# Patient Record
Sex: Female | Born: 1955 | Race: White | Hispanic: No
Health system: Southern US, Community
[De-identification: ages and names within clinical notes are randomized; demographics above are authoritative.]

## PROBLEM LIST (undated history)

## (undated) DIAGNOSIS — E119 Type 2 diabetes mellitus without complications: Secondary | ICD-10-CM

## (undated) DIAGNOSIS — I10 Essential (primary) hypertension: Secondary | ICD-10-CM

## (undated) DIAGNOSIS — M109 Gout, unspecified: Secondary | ICD-10-CM

## (undated) DIAGNOSIS — F329 Major depressive disorder, single episode, unspecified: Secondary | ICD-10-CM

## (undated) DIAGNOSIS — E669 Obesity, unspecified: Secondary | ICD-10-CM

## (undated) DIAGNOSIS — F32A Depression, unspecified: Secondary | ICD-10-CM

## (undated) DIAGNOSIS — G2581 Restless legs syndrome: Secondary | ICD-10-CM

## (undated) DIAGNOSIS — D219 Benign neoplasm of connective and other soft tissue, unspecified: Secondary | ICD-10-CM

## (undated) DIAGNOSIS — M545 Low back pain: Secondary | ICD-10-CM

## (undated) DIAGNOSIS — F419 Anxiety disorder, unspecified: Secondary | ICD-10-CM

## (undated) DIAGNOSIS — M1990 Unspecified osteoarthritis, unspecified site: Secondary | ICD-10-CM

## (undated) DIAGNOSIS — G8929 Other chronic pain: Secondary | ICD-10-CM

## (undated) DIAGNOSIS — F319 Bipolar disorder, unspecified: Secondary | ICD-10-CM

## (undated) DIAGNOSIS — K219 Gastro-esophageal reflux disease without esophagitis: Secondary | ICD-10-CM

## (undated) DIAGNOSIS — E785 Hyperlipidemia, unspecified: Secondary | ICD-10-CM

## (undated) HISTORY — DX: Benign neoplasm of connective and other soft tissue, unspecified: D21.9

## (undated) HISTORY — DX: Low back pain: M54.5

## (undated) HISTORY — DX: Obesity, unspecified: E66.9

## (undated) HISTORY — DX: Gastro-esophageal reflux disease without esophagitis: K21.9

## (undated) HISTORY — PX: KNEE ARTHROPLASTY: SHX992

## (undated) HISTORY — DX: Type 2 diabetes mellitus without complications: E11.9

## (undated) HISTORY — DX: Unspecified osteoarthritis, unspecified site: M19.90

## (undated) HISTORY — DX: Hyperlipidemia, unspecified: E78.5

## (undated) HISTORY — PX: SPINE SURGERY: SHX786

## (undated) HISTORY — DX: Bipolar disorder, unspecified: F31.9

## (undated) HISTORY — DX: Depression, unspecified: F32.A

## (undated) HISTORY — PX: OVARIAN CYST REMOVAL: SHX89

## (undated) HISTORY — PX: FOOT SURGERY: SHX648

## (undated) HISTORY — DX: Major depressive disorder, single episode, unspecified: F32.9

## (undated) HISTORY — DX: Restless legs syndrome: G25.81

## (undated) HISTORY — PX: CHOLECYSTECTOMY: SHX55

## (undated) HISTORY — DX: Anxiety disorder, unspecified: F41.9

## (undated) HISTORY — DX: Gout, unspecified: M10.9

## (undated) HISTORY — DX: Essential (primary) hypertension: I10

## (undated) HISTORY — DX: Other chronic pain: G89.29

## (undated) HISTORY — PX: ABDOMINAL HYSTERECTOMY: SHX81

---

## 1998-09-23 ENCOUNTER — Encounter: Payer: Self-pay | Admitting: Emergency Medicine

## 1998-09-23 ENCOUNTER — Emergency Department (HOSPITAL_COMMUNITY): Admission: EM | Admit: 1998-09-23 | Discharge: 1998-09-23 | Payer: Self-pay | Admitting: Emergency Medicine

## 1998-11-24 ENCOUNTER — Emergency Department (HOSPITAL_COMMUNITY): Admission: EM | Admit: 1998-11-24 | Discharge: 1998-11-24 | Payer: Self-pay | Admitting: Emergency Medicine

## 1999-01-28 ENCOUNTER — Emergency Department (HOSPITAL_COMMUNITY): Admission: EM | Admit: 1999-01-28 | Discharge: 1999-01-28 | Payer: Self-pay | Admitting: Emergency Medicine

## 1999-01-28 ENCOUNTER — Encounter: Payer: Self-pay | Admitting: Emergency Medicine

## 1999-11-11 ENCOUNTER — Encounter: Payer: Self-pay | Admitting: Surgery

## 1999-11-11 ENCOUNTER — Ambulatory Visit (HOSPITAL_COMMUNITY): Admission: RE | Admit: 1999-11-11 | Discharge: 1999-11-11 | Payer: Self-pay | Admitting: Surgery

## 1999-11-17 ENCOUNTER — Encounter: Payer: Self-pay | Admitting: Surgery

## 1999-11-19 ENCOUNTER — Encounter (INDEPENDENT_AMBULATORY_CARE_PROVIDER_SITE_OTHER): Payer: Self-pay | Admitting: Specialist

## 1999-11-19 ENCOUNTER — Encounter: Payer: Self-pay | Admitting: Surgery

## 1999-11-19 ENCOUNTER — Observation Stay (HOSPITAL_COMMUNITY): Admission: RE | Admit: 1999-11-19 | Discharge: 1999-11-19 | Payer: Self-pay | Admitting: Surgery

## 2001-06-16 ENCOUNTER — Encounter: Payer: Self-pay | Admitting: Family Medicine

## 2001-06-16 ENCOUNTER — Encounter: Admission: RE | Admit: 2001-06-16 | Discharge: 2001-06-16 | Payer: Self-pay | Admitting: Family Medicine

## 2003-05-06 ENCOUNTER — Emergency Department (HOSPITAL_COMMUNITY): Admission: EM | Admit: 2003-05-06 | Discharge: 2003-05-06 | Payer: Self-pay | Admitting: Emergency Medicine

## 2003-05-16 ENCOUNTER — Emergency Department (HOSPITAL_COMMUNITY): Admission: EM | Admit: 2003-05-16 | Discharge: 2003-05-16 | Payer: Self-pay | Admitting: Emergency Medicine

## 2003-05-28 ENCOUNTER — Emergency Department (HOSPITAL_COMMUNITY): Admission: EM | Admit: 2003-05-28 | Discharge: 2003-05-28 | Payer: Self-pay | Admitting: Emergency Medicine

## 2003-05-29 ENCOUNTER — Emergency Department (HOSPITAL_COMMUNITY): Admission: EM | Admit: 2003-05-29 | Discharge: 2003-05-29 | Payer: Self-pay | Admitting: Emergency Medicine

## 2003-06-26 ENCOUNTER — Ambulatory Visit (HOSPITAL_COMMUNITY): Admission: RE | Admit: 2003-06-26 | Discharge: 2003-06-27 | Payer: Self-pay | Admitting: Orthopaedic Surgery

## 2003-09-24 ENCOUNTER — Ambulatory Visit (HOSPITAL_COMMUNITY): Admission: RE | Admit: 2003-09-24 | Discharge: 2003-09-24 | Payer: Self-pay | Admitting: Orthopaedic Surgery

## 2004-06-26 ENCOUNTER — Emergency Department (HOSPITAL_COMMUNITY): Admission: EM | Admit: 2004-06-26 | Discharge: 2004-06-26 | Payer: Self-pay | Admitting: Emergency Medicine

## 2004-10-10 ENCOUNTER — Emergency Department (HOSPITAL_COMMUNITY): Admission: EM | Admit: 2004-10-10 | Discharge: 2004-10-10 | Payer: Self-pay | Admitting: Emergency Medicine

## 2005-12-19 ENCOUNTER — Emergency Department (HOSPITAL_COMMUNITY): Admission: EM | Admit: 2005-12-19 | Discharge: 2005-12-19 | Payer: Self-pay | Admitting: Emergency Medicine

## 2006-12-22 ENCOUNTER — Emergency Department (HOSPITAL_COMMUNITY): Admission: EM | Admit: 2006-12-22 | Discharge: 2006-12-22 | Payer: Self-pay | Admitting: Emergency Medicine

## 2006-12-23 ENCOUNTER — Ambulatory Visit: Payer: Self-pay | Admitting: Family Medicine

## 2006-12-23 LAB — CONVERTED CEMR LAB
CO2: 20 meq/L (ref 19–32)
Calcium: 9 mg/dL (ref 8.4–10.5)
Creatinine, Ser: 0.8 mg/dL (ref 0.40–1.20)
Eosinophils Relative: 5 % (ref 0–5)
Glucose, Bld: 86 mg/dL (ref 70–99)
HCT: 39.8 % (ref 36.0–46.0)
Hemoglobin: 13.1 g/dL (ref 12.0–15.0)
Lymphocytes Relative: 26 % (ref 12–46)
Lymphs Abs: 2.7 10*3/uL (ref 0.7–3.3)
Monocytes Absolute: 0.9 10*3/uL — ABNORMAL HIGH (ref 0.2–0.7)
RBC: 4.45 M/uL (ref 3.87–5.11)
Total Bilirubin: 0.4 mg/dL (ref 0.3–1.2)
Total Protein: 7.2 g/dL (ref 6.0–8.3)
Uric Acid, Serum: 4.7 mg/dL (ref 2.4–7.0)
WBC: 10.1 10*3/uL (ref 4.0–10.5)

## 2006-12-28 ENCOUNTER — Ambulatory Visit: Payer: Self-pay | Admitting: *Deleted

## 2007-04-20 ENCOUNTER — Ambulatory Visit: Payer: Self-pay | Admitting: Family Medicine

## 2007-05-02 ENCOUNTER — Ambulatory Visit (HOSPITAL_COMMUNITY): Admission: RE | Admit: 2007-05-02 | Discharge: 2007-05-02 | Payer: Self-pay | Admitting: Family Medicine

## 2007-05-05 ENCOUNTER — Emergency Department (HOSPITAL_COMMUNITY): Admission: EM | Admit: 2007-05-05 | Discharge: 2007-05-05 | Payer: Self-pay | Admitting: Emergency Medicine

## 2007-05-09 ENCOUNTER — Ambulatory Visit: Payer: Self-pay | Admitting: Family Medicine

## 2007-05-22 ENCOUNTER — Ambulatory Visit: Payer: Self-pay | Admitting: Family Medicine

## 2007-08-23 ENCOUNTER — Emergency Department (HOSPITAL_COMMUNITY): Admission: EM | Admit: 2007-08-23 | Discharge: 2007-08-23 | Payer: Self-pay | Admitting: Emergency Medicine

## 2007-09-01 ENCOUNTER — Emergency Department (HOSPITAL_COMMUNITY): Admission: EM | Admit: 2007-09-01 | Discharge: 2007-09-01 | Payer: Self-pay | Admitting: Emergency Medicine

## 2007-10-03 ENCOUNTER — Ambulatory Visit: Payer: Self-pay | Admitting: Family Medicine

## 2007-11-14 ENCOUNTER — Emergency Department (HOSPITAL_COMMUNITY): Admission: EM | Admit: 2007-11-14 | Discharge: 2007-11-14 | Payer: Self-pay | Admitting: Emergency Medicine

## 2007-12-05 ENCOUNTER — Emergency Department (HOSPITAL_COMMUNITY): Admission: EM | Admit: 2007-12-05 | Discharge: 2007-12-05 | Payer: Self-pay | Admitting: Emergency Medicine

## 2007-12-18 ENCOUNTER — Emergency Department (HOSPITAL_COMMUNITY): Admission: EM | Admit: 2007-12-18 | Discharge: 2007-12-18 | Payer: Self-pay | Admitting: Emergency Medicine

## 2008-01-12 ENCOUNTER — Ambulatory Visit: Payer: Self-pay | Admitting: Internal Medicine

## 2008-01-30 ENCOUNTER — Ambulatory Visit (HOSPITAL_COMMUNITY): Admission: RE | Admit: 2008-01-30 | Discharge: 2008-01-30 | Payer: Self-pay | Admitting: Family Medicine

## 2008-02-16 ENCOUNTER — Encounter
Admission: RE | Admit: 2008-02-16 | Discharge: 2008-05-16 | Payer: Self-pay | Admitting: Physical Medicine & Rehabilitation

## 2008-02-19 ENCOUNTER — Ambulatory Visit: Payer: Self-pay | Admitting: Physical Medicine & Rehabilitation

## 2008-02-26 ENCOUNTER — Ambulatory Visit: Payer: Self-pay | Admitting: Physical Medicine & Rehabilitation

## 2008-03-12 ENCOUNTER — Ambulatory Visit: Payer: Self-pay | Admitting: Family Medicine

## 2008-03-12 LAB — CONVERTED CEMR LAB
Amphetamine Screen, Ur: NEGATIVE
Basophils Absolute: 0.1 10*3/uL (ref 0.0–0.1)
Benzodiazepines.: NEGATIVE
CO2: 24 meq/L (ref 19–32)
Chloride: 104 meq/L (ref 96–112)
Creatinine,U: 107.5 mg/dL
Eosinophils Relative: 3 % (ref 0–5)
Glucose, Bld: 94 mg/dL (ref 70–99)
HCT: 39.9 % (ref 36.0–46.0)
Lymphocytes Relative: 24 % (ref 12–46)
Lymphs Abs: 2.3 10*3/uL (ref 0.7–4.0)
Methadone: NEGATIVE
Neutro Abs: 6.3 10*3/uL (ref 1.7–7.7)
Neutrophils Relative %: 65 % (ref 43–77)
Platelets: 330 10*3/uL (ref 150–400)
Potassium: 3.8 meq/L (ref 3.5–5.3)
Sodium: 137 meq/L (ref 135–145)
Vit D, 1,25-Dihydroxy: 19 — ABNORMAL LOW (ref 30–89)
WBC: 9.6 10*3/uL (ref 4.0–10.5)

## 2008-03-26 ENCOUNTER — Ambulatory Visit: Payer: Self-pay | Admitting: Physical Medicine & Rehabilitation

## 2008-03-29 ENCOUNTER — Ambulatory Visit (HOSPITAL_COMMUNITY)
Admission: RE | Admit: 2008-03-29 | Discharge: 2008-03-29 | Payer: Self-pay | Admitting: Physical Medicine & Rehabilitation

## 2008-04-23 ENCOUNTER — Ambulatory Visit: Payer: Self-pay | Admitting: Physical Medicine & Rehabilitation

## 2008-06-06 ENCOUNTER — Ambulatory Visit: Payer: Self-pay | Admitting: Family Medicine

## 2008-06-11 ENCOUNTER — Encounter: Admission: RE | Admit: 2008-06-11 | Discharge: 2008-09-09 | Payer: Self-pay

## 2008-06-14 ENCOUNTER — Encounter
Admission: RE | Admit: 2008-06-14 | Discharge: 2008-07-24 | Payer: Self-pay | Admitting: Physical Medicine & Rehabilitation

## 2008-06-18 ENCOUNTER — Ambulatory Visit: Payer: Self-pay | Admitting: Physical Medicine & Rehabilitation

## 2008-07-12 ENCOUNTER — Ambulatory Visit: Payer: Self-pay | Admitting: Physical Medicine & Rehabilitation

## 2008-08-02 ENCOUNTER — Ambulatory Visit: Payer: Self-pay | Admitting: Family Medicine

## 2008-08-06 ENCOUNTER — Ambulatory Visit (HOSPITAL_COMMUNITY): Admission: RE | Admit: 2008-08-06 | Discharge: 2008-08-06 | Payer: Self-pay

## 2008-09-13 ENCOUNTER — Ambulatory Visit: Payer: Self-pay | Admitting: Family Medicine

## 2008-09-19 ENCOUNTER — Ambulatory Visit: Payer: Self-pay | Admitting: Family Medicine

## 2008-09-19 LAB — CONVERTED CEMR LAB
Basophils Absolute: 0 10*3/uL (ref 0.0–0.1)
Basophils Relative: 1 % (ref 0–1)
Eosinophils Relative: 5 % (ref 0–5)
HCT: 37.4 % (ref 36.0–46.0)
Hemoglobin: 12.3 g/dL (ref 12.0–15.0)
MCHC: 32.9 g/dL (ref 30.0–36.0)
MCV: 87 fL (ref 78.0–100.0)
Monocytes Absolute: 0.5 10*3/uL (ref 0.1–1.0)
Monocytes Relative: 7 % (ref 3–12)
RBC: 4.3 M/uL (ref 3.87–5.11)
RDW: 13.9 % (ref 11.5–15.5)
Sed Rate: 8 mm/hr (ref 0–22)

## 2008-10-07 ENCOUNTER — Encounter
Admission: RE | Admit: 2008-10-07 | Discharge: 2009-01-05 | Payer: Self-pay | Admitting: Physical Medicine & Rehabilitation

## 2008-10-11 ENCOUNTER — Emergency Department (HOSPITAL_COMMUNITY): Admission: EM | Admit: 2008-10-11 | Discharge: 2008-10-11 | Payer: Self-pay | Admitting: Emergency Medicine

## 2008-10-14 ENCOUNTER — Ambulatory Visit: Payer: Self-pay | Admitting: Physical Medicine & Rehabilitation

## 2008-10-21 ENCOUNTER — Ambulatory Visit: Payer: Self-pay | Admitting: Family Medicine

## 2008-11-01 ENCOUNTER — Ambulatory Visit: Payer: Self-pay | Admitting: Family Medicine

## 2008-11-04 ENCOUNTER — Ambulatory Visit: Payer: Self-pay | Admitting: Internal Medicine

## 2008-11-21 ENCOUNTER — Ambulatory Visit: Payer: Self-pay | Admitting: Internal Medicine

## 2008-12-16 ENCOUNTER — Ambulatory Visit: Payer: Self-pay | Admitting: Internal Medicine

## 2008-12-23 ENCOUNTER — Ambulatory Visit: Payer: Self-pay | Admitting: Internal Medicine

## 2009-01-29 ENCOUNTER — Ambulatory Visit: Payer: Self-pay | Admitting: Internal Medicine

## 2009-02-03 ENCOUNTER — Ambulatory Visit (HOSPITAL_COMMUNITY): Admission: RE | Admit: 2009-02-03 | Discharge: 2009-02-03 | Payer: Self-pay | Admitting: Family Medicine

## 2009-02-06 ENCOUNTER — Ambulatory Visit: Payer: Self-pay | Admitting: Family Medicine

## 2009-02-27 ENCOUNTER — Ambulatory Visit: Payer: Self-pay | Admitting: Internal Medicine

## 2009-03-10 ENCOUNTER — Ambulatory Visit: Payer: Self-pay | Admitting: Internal Medicine

## 2009-03-20 ENCOUNTER — Encounter: Admission: RE | Admit: 2009-03-20 | Discharge: 2009-03-20 | Payer: Self-pay | Admitting: Podiatrist

## 2009-03-27 ENCOUNTER — Ambulatory Visit: Payer: Self-pay | Admitting: Family Medicine

## 2009-04-15 ENCOUNTER — Encounter: Admission: RE | Admit: 2009-04-15 | Discharge: 2009-04-15 | Payer: Self-pay | Admitting: Neurology

## 2009-04-15 ENCOUNTER — Ambulatory Visit: Payer: Self-pay | Admitting: Family Medicine

## 2009-04-22 ENCOUNTER — Encounter: Admission: RE | Admit: 2009-04-22 | Discharge: 2009-04-22 | Payer: Self-pay | Admitting: Neurology

## 2009-05-01 ENCOUNTER — Ambulatory Visit: Payer: Self-pay | Admitting: Family Medicine

## 2009-05-28 ENCOUNTER — Ambulatory Visit: Payer: Self-pay | Admitting: Family Medicine

## 2009-06-09 ENCOUNTER — Ambulatory Visit (HOSPITAL_COMMUNITY): Admission: RE | Admit: 2009-06-09 | Discharge: 2009-06-09 | Payer: Self-pay | Admitting: Obstetrics

## 2009-07-02 ENCOUNTER — Ambulatory Visit: Payer: Self-pay | Admitting: Family Medicine

## 2009-07-03 DIAGNOSIS — J309 Allergic rhinitis, unspecified: Secondary | ICD-10-CM | POA: Insufficient documentation

## 2009-07-04 ENCOUNTER — Ambulatory Visit: Payer: Self-pay | Admitting: Family Medicine

## 2009-07-17 ENCOUNTER — Ambulatory Visit: Payer: Self-pay | Admitting: Family Medicine

## 2009-07-24 ENCOUNTER — Ambulatory Visit: Payer: Self-pay | Admitting: Family Medicine

## 2009-08-07 ENCOUNTER — Ambulatory Visit (HOSPITAL_COMMUNITY): Admission: RE | Admit: 2009-08-07 | Discharge: 2009-08-07 | Payer: Self-pay | Admitting: Obstetrics & Gynecology

## 2009-09-12 ENCOUNTER — Encounter: Payer: Self-pay | Admitting: Obstetrics & Gynecology

## 2009-09-12 ENCOUNTER — Inpatient Hospital Stay (HOSPITAL_COMMUNITY): Admission: RE | Admit: 2009-09-12 | Discharge: 2009-09-15 | Payer: Self-pay | Admitting: Obstetrics & Gynecology

## 2009-09-21 ENCOUNTER — Ambulatory Visit: Payer: Self-pay | Admitting: Obstetrics and Gynecology

## 2009-09-21 ENCOUNTER — Inpatient Hospital Stay (HOSPITAL_COMMUNITY): Admission: AD | Admit: 2009-09-21 | Discharge: 2009-09-21 | Payer: Self-pay | Admitting: Obstetrics

## 2009-09-26 ENCOUNTER — Ambulatory Visit (HOSPITAL_COMMUNITY): Admission: RE | Admit: 2009-09-26 | Discharge: 2009-09-26 | Payer: Self-pay | Admitting: Obstetrics & Gynecology

## 2009-09-29 ENCOUNTER — Inpatient Hospital Stay (HOSPITAL_COMMUNITY): Admission: AD | Admit: 2009-09-29 | Discharge: 2009-10-04 | Payer: Self-pay | Admitting: Obstetrics & Gynecology

## 2009-11-05 DIAGNOSIS — F329 Major depressive disorder, single episode, unspecified: Secondary | ICD-10-CM | POA: Insufficient documentation

## 2009-11-19 ENCOUNTER — Ambulatory Visit (HOSPITAL_COMMUNITY): Payer: Self-pay | Admitting: Psychiatry

## 2009-11-21 ENCOUNTER — Ambulatory Visit (HOSPITAL_COMMUNITY): Payer: Self-pay | Admitting: Psychiatry

## 2009-11-28 ENCOUNTER — Ambulatory Visit (HOSPITAL_COMMUNITY): Payer: Self-pay | Admitting: Psychiatry

## 2009-11-28 ENCOUNTER — Ambulatory Visit (HOSPITAL_COMMUNITY)
Admission: RE | Admit: 2009-11-28 | Discharge: 2009-11-28 | Payer: Self-pay | Source: Home / Self Care | Admitting: Obstetrics & Gynecology

## 2009-12-02 ENCOUNTER — Ambulatory Visit (HOSPITAL_COMMUNITY): Payer: Self-pay | Admitting: Psychiatry

## 2009-12-08 ENCOUNTER — Encounter (HOSPITAL_BASED_OUTPATIENT_CLINIC_OR_DEPARTMENT_OTHER): Admission: RE | Admit: 2009-12-08 | Discharge: 2009-12-31 | Payer: Self-pay | Admitting: Internal Medicine

## 2009-12-12 ENCOUNTER — Ambulatory Visit (HOSPITAL_COMMUNITY): Payer: Self-pay | Admitting: Psychiatry

## 2010-02-04 ENCOUNTER — Ambulatory Visit (HOSPITAL_COMMUNITY)
Admission: RE | Admit: 2010-02-04 | Discharge: 2010-02-04 | Payer: Self-pay | Source: Home / Self Care | Admitting: Family Medicine

## 2010-02-12 ENCOUNTER — Encounter
Admission: RE | Admit: 2010-02-12 | Discharge: 2010-02-12 | Payer: Self-pay | Source: Home / Self Care | Attending: Family Medicine | Admitting: Family Medicine

## 2010-04-29 ENCOUNTER — Ambulatory Visit: Payer: Self-pay | Admitting: Pain Medicine

## 2010-05-06 ENCOUNTER — Ambulatory Visit: Payer: Self-pay | Admitting: Pain Medicine

## 2010-05-23 LAB — CREATININE, SERUM
Creatinine, Ser: 0.55 mg/dL (ref 0.4–1.2)
GFR calc Af Amer: >60 mL/min (ref >60)
GFR calc non Af Amer: >60 mL/min (ref >60)

## 2010-05-23 LAB — DIFFERENTIAL
Basophils Absolute: 0.1 10*3/uL (ref 0.0–0.1)
Eosinophils Relative: 12 % — ABNORMAL HIGH (ref 0–5)
Lymphocytes Relative: 24 % (ref 12–46)
Monocytes Absolute: 0.7 10*3/uL (ref 0.1–1.0)
Monocytes Relative: 9 % (ref 3–12)
Neutro Abs: 4.4 10*3/uL (ref 1.7–7.7)

## 2010-05-23 LAB — CBC
HCT: 28.4 % — ABNORMAL LOW (ref 36.0–46.0)
Hemoglobin: 9.7 g/dL — ABNORMAL LOW (ref 12.0–15.0)
MCV: 89.1 fL (ref 78.0–100.0)
Platelets: 555 10*3/uL — ABNORMAL HIGH (ref 150–400)
RBC: 3.14 MIL/uL — ABNORMAL LOW (ref 3.87–5.11)
RDW: 14.9 % (ref 11.5–15.5)
RDW: 15.6 % — ABNORMAL HIGH (ref 11.5–15.5)
WBC: 8.1 10*3/uL (ref 4.0–10.5)
WBC: 9.5 10*3/uL (ref 4.0–10.5)

## 2010-05-23 LAB — WOUND CULTURE: Culture: NO GROWTH

## 2010-05-23 LAB — BASIC METABOLIC PANEL
BUN: 9 mg/dL (ref 6–23)
Chloride: 105 mEq/L (ref 96–112)
GFR calc non Af Amer: >60 mL/min (ref >60)
Glucose, Bld: 109 mg/dL — ABNORMAL HIGH (ref 70–99)
Potassium: 3.5 mEq/L (ref 3.5–5.1)
Sodium: 136 mEq/L (ref 135–145)

## 2010-05-24 LAB — BASIC METABOLIC PANEL
BUN: 9 mg/dL (ref 6–23)
CO2: 26 mEq/L (ref 19–32)
Calcium: 9.4 mg/dL (ref 8.4–10.5)
Chloride: 105 mEq/L (ref 96–112)
Chloride: 107 mEq/L (ref 96–112)
Creatinine, Ser: 0.68 mg/dL (ref 0.4–1.2)
Creatinine, Ser: 0.68 mg/dL (ref 0.4–1.2)
GFR calc Af Amer: >60 mL/min (ref >60)
GFR calc Af Amer: >60 mL/min (ref >60)
Glucose, Bld: 121 mg/dL — ABNORMAL HIGH (ref 70–99)
Sodium: 140 mEq/L (ref 135–145)

## 2010-05-24 LAB — SURGICAL PCR SCREEN: Staphylococcus aureus: POSITIVE — AB

## 2010-05-24 LAB — CBC
Hemoglobin: 10.2 g/dL — ABNORMAL LOW (ref 12.0–15.0)
MCV: 88.4 fL (ref 78.0–100.0)
Platelets: 216 10*3/uL (ref 150–400)
RBC: 3.33 MIL/uL — ABNORMAL LOW (ref 3.87–5.11)
WBC: 8.2 10*3/uL (ref 4.0–10.5)

## 2010-05-24 LAB — POCT I-STAT 4, (NA,K, GLUC, HGB,HCT)
Hemoglobin: 11.9 g/dL — ABNORMAL LOW (ref 12.0–15.0)
Potassium: 4.7 mEq/L (ref 3.5–5.1)

## 2010-06-09 ENCOUNTER — Ambulatory Visit: Payer: Self-pay | Admitting: Pain Medicine

## 2010-06-19 ENCOUNTER — Other Ambulatory Visit: Payer: Self-pay | Admitting: Obstetrics & Gynecology

## 2010-06-19 DIAGNOSIS — E278 Other specified disorders of adrenal gland: Secondary | ICD-10-CM

## 2010-06-24 ENCOUNTER — Ambulatory Visit (HOSPITAL_COMMUNITY)
Admission: RE | Admit: 2010-06-24 | Discharge: 2010-06-24 | Disposition: A | Discharge status: Home / Self Care | Payer: PRIVATE HEALTH INSURANCE | Source: Ambulatory Visit | Attending: Obstetrics & Gynecology | Admitting: Obstetrics & Gynecology

## 2010-06-24 ENCOUNTER — Other Ambulatory Visit: Payer: Self-pay | Admitting: Obstetrics & Gynecology

## 2010-06-24 DIAGNOSIS — E278 Other specified disorders of adrenal gland: Secondary | ICD-10-CM

## 2010-06-24 DIAGNOSIS — D3500 Benign neoplasm of unspecified adrenal gland: Secondary | ICD-10-CM | POA: Insufficient documentation

## 2010-07-01 ENCOUNTER — Ambulatory Visit: Payer: Self-pay | Admitting: Pain Medicine

## 2010-07-06 ENCOUNTER — Other Ambulatory Visit: Payer: Self-pay | Admitting: Family Medicine

## 2010-07-06 DIAGNOSIS — Z09 Encounter for follow-up examination after completed treatment for conditions other than malignant neoplasm: Secondary | ICD-10-CM

## 2010-07-13 DIAGNOSIS — I10 Essential (primary) hypertension: Secondary | ICD-10-CM | POA: Insufficient documentation

## 2010-07-13 DIAGNOSIS — G4700 Insomnia, unspecified: Secondary | ICD-10-CM | POA: Insufficient documentation

## 2010-07-13 DIAGNOSIS — R7301 Impaired fasting glucose: Secondary | ICD-10-CM | POA: Insufficient documentation

## 2010-07-21 NOTE — Consult Note (Signed)
CONSULT REQUESTED BY:  Maurice March, MD   Consult request for the evaluation of low back pain.   CHIEF COMPLAINT:  Right lower extremity pain.   HISTORY:  A 55 year old female who has at least a 5-year history of back  to lower extremity pain.  She denies any type of traumatic event.  She  did undergo a right L5-S1 laminectomy per Dr. Sharolyn Douglas in 2005.  Approximately 1 month later, she had a recurrent posterolateral disk  compressing the right S1 nerve root including 1-cm free fragment in the  lateral recess at L5.  She had no reoperation.  Since that time, she has  had several ED visits, some of which were for low back pain, most  recently December 05, 2007, prescribed hydrocodone.  She has been using  tramadol as well but has sometimes ran out of this as well.  She has  tried oxycodone through ED in October 2009.  She has had no bowel or  bladder incontinence associated with this.  I reviewed her last MRI of  the lumbar spine which did demonstrated recurrent posterolateral disk  compressing the right S1 nerve root that was from July 2005.  She has  had no new studies since that time.  She has had a lumbar spine x-ray  dated May 02, 2007, which showed some progression of disk height  loss L4-L5, L5-S1 when compared to 2005 study.  She has tried Neurontin  400 mg only takes it b.i.d. at this time.  She states that it does help  her.  She takes sulindac 200 mg per day.  She states that she has been  told she may have gout in the left foot.   SOCIAL HISTORY:  She no longer works.  She is divorced and lives with  her brother.  She has applied for disability.  Functionally, she needs  help with household duties.  She is able to walk.  She is independent  with the remainder of her self-care.   REVIEW OF SYSTEMS:  Positive for weight gain, easy bleeding, limb  swelling, confusion, depression, anxiety, trouble walking, spasms,  numbness, and tingling specifically in the right  lower extremity.  She  has pain with weightbearing in the left foot.   FAMILY HISTORY:  Positive for heart disease, diabetes, high blood  pressure.   PHYSICAL EXAMINATION:  She is an overweight female in no acute distress.  Blood pressure is 144/90, pulse 69, respirations 18, O2 sat 100% on room  air.   Her orientation is x3.  Affect is bright and alert.  Gait is with a limp  favoring the left lower extremity.  Her left lower extremity shows no  evidence of pedal edema.  She does have what appears to be a ganglion in  the dorsal midfoot.   Her deep tendon reflexes showed decreased bilateral S1 reflex, otherwise  normal in the patellar as well as biceps, triceps, and brachioradialis.   Motor strength is 5/5 bilateral upper and lower extremities.  Range of  motion is normal bilateral lower extremities.  She has negative straight  leg raise test.  She has negative Fabere maneuver.  Her lumbar spine  range of motion is 50% forward flexion and 50% extension.   IMPRESSION:  Right S1 radiculopathy, chronic and progressive.  I agree  with Neurontin, but would increase the dose to 400 t.i.d. per week and  then q.i.d. thereafter, may need to work up to a total of 2400 mg per  day.  We will reinstitute tramadol at 50 mg t.i.d.   We will schedule for right S1 transforaminal lumbar epidural steroid  injection under fluoroscopic guidance.   Check urine drug screen, although overall I think we will minimize the  narcotic analgesic usage.   Thank you for this interesting consultation.   ADDENDUM:  Oswestry disability index today 72%.   I reviewed her left foot x-rays, which were performed on December 22, 2006, showed some mild midfoot arthritic changes some small amount of  spurring and subchondral sclerosis.  May benefit from adaptive shoe wear  in the left lower extremity versus podiatry evaluation.      Erick Colace, M.D.  Electronically Signed     AEK/MedQ  D:02/19/2008  16:36:01  T:02/20/2008 05:30:19  Job #:  [id number]

## 2010-07-21 NOTE — Assessment & Plan Note (Signed)
The patient is a 55 year old female who I saw initially on February 19, 2008, however, in followup on March 26, 2008, she has a history L5-S1  disk causing compression in the right S1 nerve root.  She, after initial  denial of MRI lumbar spine, was approved and underwent MRI.  She had  evidence of some hypertrophic facet multiple levels.  She had  postoperative changes in the right with right side laminectomy and  enhancing epidural fibrosis around the thecal sac and right S1 nerve  root, which correlates with her right leg numbness.   Her major pain at this time is the left foot.  We did do x-rays of her  left foot which did demonstrate degenerative changes in the midfoot.  Her pain is mainly in the dorsum of her foot.  She had some swelling in  that area.  Pain level is 8-9/10.  Sleep is poor.  Pain is worse with  walking with her foot, bending, standing, and sitting with her back.   REVIEW OF SYSTEMS:  Positive for numbness and tingling in the right  lower extremity, spasms, dizziness, depression, anxiety, but negative  for suicidal thoughts.   SOCIAL HISTORY:  Divorced, lives with her brother.  She has a history of  cocaine abuse.   PHYSICAL EXAMINATION:  Blood pressure 146/87, pulse 86, respirations 18,  O2 sat 98% on room air.  Her Oswestry disability score is 74%.  Her left  dorsum of foot has some swelling but no erythema.  She has good pulse  bilaterally in dorsalis pedis.   There is no discoloration, and she has pain when she had bears weight on  the foot.  She is unable to toe walk or heel walk on that side.   In regards to her low back, she has some tenderness in L4-5 area in the  paraspinals mainly right greater than left side.  She has 50% forward  flexion, 50% extension.  No directional preference in terms of relief.   IMPRESSION:  1. Left midfoot pain, degenerative joint disease, we will refer her to      podiatry.  2. 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   We have also bumped up her tramadol to 5 times per day of 50 mg dose.  She complains of back spasms, has had some relief with back with 10 mg  b.i.d., we will bump it up t.i.d.  I will see her back in 2 months.  We  discussed other treatment including epidural steroid injections.      Erick Colace, M.D.  Electronically Signed     AEK/MedQ  D:  04/23/2008 11:06:48  T:  04/23/2008 22:21:14  Job #:  [id number]   cc:   Lenn Sink, D.P.M.  Fax: [phone]

## 2010-07-21 NOTE — Assessment & Plan Note (Signed)
Rachel Luna is a patient I saw initially on February 19, 2008.  She has a  history of L5-S1 disk causing compression of right S1 nerve root.  We  ordered MRI of the lumbar spine.  Medicaid did deny, reason was the  patient had not had pain been treated long enough prior to this.   We did do a right S1 transforaminal lumbar epidural steroid injection  under fluoroscopic guidance without significant relief of symptoms,  maybe I have followup per her report.   Her Oswestry disability index is 72%.   She has pain with household activities.   REVIEW OF SYSTEMS:  Positive for numbness, tingling, spasms, depression,  anxiety, and as well as night sweats.  Pain is described as sharp, dull,  constant, tingling, and aching.   Blood pressure 144/87, pulse 79, respirations 18, and O2 sat 98% on room  air.  General, no acute stress.  Orientation x3.  Affect is alert.  Gait  is normal, except for favoring the left lower extremity.  She has pain  with weightbearing in the midfoot area and she states that this has been  going on for a couple of years, but gradually increasing over the last  couple of months.  Exam of her back has tenderness to palpation,  lumbosacral junction.  Her lumbar range of motion is 60% forward flexion  and extension, lateral rotation and bending.   Her lower extremity strength is normal with exception of right S1, i.e.  gastroc has decreased strength as well as decreased deep tendon reflexes  of right S1 compared to the normal responses and other muscle reflexes.  Her sensation is reduced in the right L4-L5 and S1 dermatomes.   Extremities without edema.  Her pulses are normal in the posterior  tibial and dorsalis pedis.  She has tenderness to palpation over the  dorsal surface of the right midfoot.  No erythema.  No swelling.   IMPRESSION:  1. Lumbar radiculopathy appears to be multiple levels not just with      S1.  We would like to check an MRI to further guide  treatment.  2. Left midfoot pain, likely degenerative.  No acute injuries.  We      will check x-ray.  3. We will boost her Neurontin dose and if it is helpful for her, she      will take 600 mg tablet 1 p.o. t.i.d. and change her tramadol 50 mg      1 p.o. q.6 h. for #4 per day.  We will see her back in 1 month.      Erick Colace, M.D.  Electronically Signed     AEK/MedQ  D:  03/26/2008 10:44:45  T:  03/26/2008 04:54:09  Job #:  [id number]   cc:   Maurice March, M.D.  Fax: [phone]

## 2010-07-21 NOTE — Consult Note (Signed)
Rachel Luna, Rachel Luna NO.:  [id number]   MEDICAL RECORD NO.:  [id number]          PATIENT TYPE:  EMS   LOCATION:  ED                           FACILITY:  [facility]   PHYSICIAN:  Levert Feinstein, MD          DATE OF BIRTH:  [birth year]   DATE OF CONSULTATION:  DATE OF DISCHARGE:  10/11/2008                                 CONSULTATION   CHIEF COMPLAINT:  Right hand pain, weakness.   HISTORY OF PRESENT ILLNESS:  The patient is a 55 year old right-handed  Caucasian female, presenting to the ED, complains of 2 day history of  right shoulder, elbow, wrist pain, subjective right hand weakness.   PAST MEDICAL HISTORY:  Chronic pain, status post left S1 laminectomy,  with chronic low back pain on disability,   Two days ago, without trigger event, she woke up, noticed right hand  pain, difficulty using her right hand, also described as if the skin is  off on the right second and third fingers, over the past few days  symptoms persistent.  However, during the interview, she has spontaneous  activity of the right arm, leg and hand, gripping subject with her right  hand, with no difficulty noticed.   REVIEW OF SYSTEMS:  Pertinent as above.   Specifically, there is no right facial weakness, dysarthria, chronic  right leg pain and uneven length due to previous low back surgery.   SURGICAL HISTORY:  Right S1-9 laminectomy.   SOCIAL HISTORY:  She lives with her relatives.  No drug abuse.  No  drinking.  No alcohol.   FAMILY HISTORY:  Mother suffered cancer.  Also significant for diabetes,  hypertension.   ALLERGIES:  No known drug allergies.   HOME MEDICATIONS:  Allegra, baclofen, tramadol, Zoloft, diclofenac,  calcium, magnesium, zinc, Flonase.   PHYSICAL EXAMINATION:  VITAL SIGNS:  Temperature 97.6, blood pressure  140/91, heart rate 78, respirations 18.  CARDIAC:  Regular rate and rhythm.  NECK:  Supple.  NEUROLOGIC EXAMINATION:  She is awake, alert, oriented to  history,  taking care of conversation.  Cranial nerves II-XII are normal.  Motor  examination variable effort on right upper extremity due to pain, but  overall strength is elicited as 5-/5, and sensory was normal to light  touch, pinprick.  There is indeed right wrist pain with Tinel's sign.  Deep tendon reflexes were normal and symmetric.  Plantar responses were  flexor.  Coordination:  Normal finger-to-nose, heel-to-shin.  Gait:  Not  antalgic.   ASSESSMENT/PLAN:  A 55 year old right-handed female, presenting with  right/arm hand pain, limited range of motion in the setting of pain, at  least 5-/5 strenght proximally and distally.  The history is less  suggestive of a central  nervous system etiology such as stroke.  I would worry about peripheral  etiology such as right carpal tunnel syndrome.  I can follow her up as  an outpatient if symptoms persistent, may try NSAIDS (Nonsteroidal  Antiinflammatory Drugs).      Levert Feinstein, MD  Electronically Signed     YY/MEDQ  D:  10/11/2008  T:  10/12/2008  Job:  [id number]

## 2010-07-21 NOTE — Assessment & Plan Note (Signed)
A 55 year old female, who has a history of L5-S1 disk compressing right  S1 nerve root.  This was seen on MRI and she has had prior right side  laminectomy at that level with epidural fibrosis involving the right S1  nerve root.  She has had no improvement with S1 injections other than  the first few days after a selective nerve root block.  She has had some  left foot pain with x-rays showing degenerative changes in the midfoot.  She has seen Dr. Ralene Cork for further evaluation.  She received an  injection as well as iontophoresis referral for PT, has had four visits  without much improvement thus far.   Prior history is urine drug screen showing cocaine use and for this  reason, he is on non-narcotic management.  She has been getting fair  relief with tramadol 50 mg 5 times a day, baclofen 10 mg b.i.d., and  Neurontin 800 t.i.d.   REVIEW OF SYSTEMS:  Positive for increased tingling and numbness  involving not only the right lower extremity but also left lower  extremity.   PHYSICAL EXAMINATION:  GENERAL:  No acute distress and affect  appropriate.  VITAL SIGNS:  Her blood pressure 152/94, pulse 90, respirations 18, and  O2 sat 97% room air.  NEUROLOGIC:  Orientation x3.  Affect alert.  MUSCULOSKELETAL:  Gait is with a limp.  She has favoring the left lower  extremity.  She has decreased sensation in bilateral first dorsal  interosseous of foot as well as the left fifth toe.  Also, has reduction  of sensation, however, she can discern pinprick in the medial malleolar  area as well as patellar.  Her deep tendon reflexes 1+ in bilateral  patellar and Achilles.  She has no evidence of intrinsic atrophy.  No  skin discoloration.  Good pulses.  She has heavily calloused feet.  Her  motor strength is 5/5 in bilateral hip flexion, knee extension, ankle  dorsiflexion, great toe extensor.   IMPRESSION:  Lower extremity paresthesias.  Certainly the right lower  extremity can be explained by  her right S1 fibrosis, although the extent  surpasses just the S1 dermatome.  She is also developing some left lower  extremity involvement, question whether this is basically related to her  foot pain or whether this is more a neuropathic process.   PLAN:  We will set her for EMG NCV and see her back for that in about a  month's time. We will not change anything in terms of medications at the  current time.      Erick Colace, M.D.  Electronically Signed     AEK/MedQ  D:  06/18/2008 11:09:24  T:  06/19/2008 01:55:34  Job #:  [id number]   cc:   Alvan Dame, D.P.M.  Fax: [phone]

## 2010-07-21 NOTE — Procedures (Signed)
[name], Rachel Luna NO.:  [id number]   MEDICAL RECORD NO.:  [id number]           PATIENT TYPE:   LOCATION:                                 FACILITY:   PHYSICIAN:  Erick Colace, M.D.DATE OF BIRTH:  [birth year]   DATE OF PROCEDURE:  DATE OF DISCHARGE:                               OPERATIVE REPORT   PROCEDURE:  Right S1 transforaminal lumbar epidural steroid injection  under fluoroscopic guidance.   INDICATION:  Right S1 radiculopathy.  Pain is only partially responsive  to medication management and other conservative care.  Interferes with  self-care mobility.   Informed consent was obtained after describing the risks and benefits of  the procedure with the patient.  These include bleeding, bruising,  infection.  She elects to proceed and has given written consent.  The  patient placed prone on fluoroscopy table.  Betadine prep, sterilely  draped.  A 25-gauge, 1-1/2-inch needle was used to anesthetize the skin  and subcutaneous tissue, 1% lidocaine x2 mL.  Then, a 22-gauge, 3-1/2-  inch spinal needle was inserted under fluoroscopic guidance targeting  the right S1 foramen.  AP, lateral, oblique imaging utilized.  Omnipaque  180 x1 mL demonstrated good nerve root spread as well as epidural spread  followed by injection of 1 mL of 10 mg/mL dexamethasone, and 1 mL of 1%  MPF lidocaine.  The patient tolerated the procedure well.  Pre and post-  injection vitals stable.  Post-injection instructions given.  Followup  visit in 3 weeks.      Erick Colace, M.D.  Electronically Signed     AEK/MEDQ  D:  02/26/2008 09:52:56  T:  02/26/2008 23:04:59  Job:  [id number]   cc:   Maurice March, M.D.  Fax: [phone]

## 2010-07-21 NOTE — Assessment & Plan Note (Signed)
A 55 year old female that I last saw on Jul 12, 2008, at which time I did  EMG/NCV bilateral lower extremities.  She had some evidence of chronic  S1 nerve root lesion with an absent H-reflex on that side and EMG  abnormalities in the right gastrocnemius muscle.  She did not tolerate  the full examination however, and I was not able to get additional  muscle studied other than the right biceps femoris, long head, which was  normal.  She overall tolerated the needle exam very poorly.  She sees  Dr. Ralene Cork for foot pain.  She has recently started seeing Dr. Anne Hahn  for tingling pain in the hands and feet.  Her right hand is more  affected than the left hand.  She describes sharp and burning pain,  stabbing, tingling and aching.  Her sleep is poor.  She has no clear  pain alleviating or exacerbating factors.  Relief from meds she  indicates is good.  She needs some help with certain household duties  and shopping, but is otherwise independent.  She was last employed in  2007.  She has a date for disability hearing.   REVIEW OF SYSTEMS:  Multiple positives, including numbness, tingling,  trouble walking, spasms, dizziness, depression, anxiety.  Please see  health and history form for details.   Other MDs include Maurice March, MD, primary care, Marlan Palau, MD, Neurology, and her podiatrist, Dr. Ralene Cork.   SOCIAL HISTORY:  Divorced, lives with her brother.  Denies any drug or  alcohol abuse.  She does have a past history of cocaine abuse and was  made nonnarcotic management due to positive cocaine metabolites on urine  drug screen testing..   Blood pressure 149/78, pulse 91, respirations 18, and O2 sat 97% on room  air.  Overweight female in no acute stress.  Orientation x3.  Affect is  alert.  Gait is with a limp.  She has no evidence of toe drag or knee  instability.  She favors right lower extremity.  Extremity exam, she has  normal muscle bulk, no evidence of intrinsic  atrophy.  She has good  strength.  Negative straight leg raise test.  She has globally decreased  sensation in the entire right lower extremity, nondermatomal pattern.  Left lower extremity has no sensory abnormalities.   IMPRESSION:  History of L5-S1 disk, lumbar post laminectomy syndrome.  She has some epidural fibrosis that causes some chronic S1 nerve root  irritation.  She has done relatively well on tramadol and gabapentin.  She is looking to get further evaluation and treatment for numbness and  tingling, which thus far appears to be just limited to and S1  radiculitis.  She continues on tramadol 50 mg up to 5 times a day,  baclofen 10 mg t.i.d. and Neurontin 800 t.i.d.  She wishes to follow up  further with Dr. Anne Hahn rather than at this office and I will fax the  necessary records.      Erick Colace, M.D.  Electronically Signed     AEK/MedQ  D:  10/14/2008 14:43:40  T:  10/15/2008 04:25:59  Job #:  [id number]

## 2010-07-24 NOTE — H&P (Signed)
NAME:  Rachel Luna, Rachel Luna                          ACCOUNT NO.:  [id number]   MEDICAL RECORD NO.:  [id number]                   PATIENT TYPE:  OIB   LOCATION:  NA                                   FACILITY:  [facility]   PHYSICIAN:  Sharolyn Douglas, M.D.                     DATE OF BIRTH:  1955/09/11   DATE OF ADMISSION:  06/26/2003  DATE OF DISCHARGE:                                HISTORY & PHYSICAL   CHIEF COMPLAINT:  Pain in my back and right leg.   HISTORY OF PRESENT ILLNESS:  This 55 year old white female fell backwards  onto her buttocks area on May 06, 2003.  The next day she was sitting  down on her toilet at home, felt something pop in her low back, had  immediate pain radiating into the right lower extremity.  The patient has  had intermittent painful episodes to the point where she has had to go to  the emergency room several times.  She has numbness and tingling in the  right lower extremity.  She essentially can not get about due to the fact of  the pain, and the fact now that she can not feel the floor with her right  foot when she ambulates.  The patient was seen initially, on June 03, 2003,  in Dr. Ranell Patrick' clinic by a Mr. Alphonsa Overall P.A.C.  She was referred to Dr.  Noel Gerold for further evaluation and treatment.  MRI has shown a herniated  nucleus pulposus at the L5-S1.  We plan to do an L5-S1 microdiskectomy on  the right.  Examination of the lower extremities and lumbar showed  tenderness to palpation in the L5-S1 area.  She is noted to have L5  distribution paresthesias in the right lower extremity.   CURRENT MEDICATIONS:  Percocet and Flexeril.   She denies any medical allergies.   PAST MEDICAL HISTORY:  1. She states that she has had a reported heart murmur all of her life.  2. She had a cholecystectomy via scope done in the past.  3. Ovarian cyst removal.  4. She states that she is easily bruised.   FAMILY HISTORY:  Positive for diabetes and breast cancer.   SOCIAL  HISTORY:  The patient is single.  She is a Conservation officer, [employer].  She has no  intake of tobacco products and social ETOH.  She has no children.   REVIEW OF SYSTEMS:  CNS:  No seizure disorder, paralysis, double vision.  The patient does have paresthesias consistent with an L5-S1 nerve root on  the right.  CARDIOVASCULAR:  No chest pain, no angina, no orthopnea.  RESPIRATORY:  No productive cough, no hemoptysis, no shortness of breath.  GASTROINTESTINAL:  No nausea, vomiting, melena, bloody stools.  GENITOURINARY:  No discharge, dysuria, hematuria.  MUSCULOSKELETAL:  Primarily in present illness.   PHYSICAL EXAMINATION:  GENERAL:  Alert, cooperative, friendly, 55 year old  white female who is accompanied by her sister-in-law.  VITAL SIGNS:  Blood pressure 156/86, pulse 72, respirations 12.  HEENT:  Normocephalic.  PERRLA.  EOM intact.  Oropharynx is clear.  CHEST:  Clear to auscultation.  No rhonchi, no rales, no wheezes.  HEART:  Regular rate and rhythm.  There is a very faint grade 2/6  holosystolic murmur at the right sternal border.  ABDOMEN:  Soft, nontender.  Liver spleen not felt.  GENITALIA:  Not done, not pertinent to present illness.  RECTAL:  Not done, not pertinent to present illness.  PELVIC:  Not done, not pertinent to present illness.  BREAST:  Not done, not pertinent to present illness.  EXTREMITIES:  The patient has limited and resistant straight leg raise,  positive on the right.   ADMITTING DIAGNOSIS:  L5-S1 herniated nucleus pulposus.   PLAN:  The patient will undergo an L5-S1 microdiskectomy on the right.      Dooley L. Cherlynn June.                 Sharolyn Douglas, M.D.    DLU/MEDQ  D:  06/18/2003  T:  06/19/2003  Job:  [id number]

## 2010-07-24 NOTE — Op Note (Signed)
NAME:  Rachel Luna, Rachel Luna                          ACCOUNT NO.:  [id number]   MEDICAL RECORD NO.:  [id number]                   PATIENT TYPE:  OIB   LOCATION:  5007                                 FACILITY:  [facility]   PHYSICIAN:  Sharolyn Douglas, M.D.                     DATE OF BIRTH:  07/24/1955   DATE OF PROCEDURE:  06/26/2003  DATE OF DISCHARGE:                                 OPERATIVE REPORT   PREOPERATIVE DIAGNOSIS:  Right L5-S1 herniated nucleus pulposus with lateral  recess stenosis and right lower extremity radiculopathy.   POSTOPERATIVE DIAGNOSIS:  Right L5-S1 herniated nucleus pulposus with  lateral recess stenosis and right lower extremity radiculopathy.   OPERATION PERFORMED:  Right L5-S1 microdiskectomy and lateral recess  decompression.   SURGEON:  Sharolyn Douglas, M.D.   ASSISTANT:  Verlin Fester, P.A.   ANESTHESIA:  General endotracheal.   COMPLICATIONS:  None.   INDICATIONS FOR PROCEDURE:  The patient is a 56 year old female with severe  right lower extremity radiculopathy secondary to degenerative disk disease,  lateral recess stenosis and herniated nucleus pulposus.  Her symptoms have  been refractory to conservative care and she has elected to undergo  microdecompression in hopes of improving her symptomatology.   DESCRIPTION OF PROCEDURE:  The patient was properly identified in the  holding area and taken to the operating room.  She underwent general  endotracheal anesthesia without difficulty.  She was given prophylactic IV  antibiotics.  She was carefully turned prone on the Wilson frame.  All bony  prominences were padded.  Face and eyes were protected at all times.  Back  prepped and draped in the usual sterile fashion.  Fluoroscopy was brought  into the field and the L5-S1 interspace was identified.  A 2 cm incision was  made just off the midline on the right side over the L5-S1 interspace.  Dilators were used from the [facility] retractor system to spread the  paraspinal muscles docking on the L5-S1 interspace.  We confirmed our  location with fluoroscopy.  We then placed the MaXcess retractor with length  70 blades.  The retractor was gently expanded.  We confirmed positioning  with lateral fluoroscopy.  The paraspinal muscle was cleaned over the L5-S1  interspace.  The surgical microscope was draped and brought into the field.  A small amount of the L5 lamina was removed with the high speed bur to allow  access to the spinal canal.  The ligamentum flavum was removed piecemeal.  We identified the traversing S1 nerve root.  It was being displaced  posteriorly by a disk fragment.  The nerve was gently mobilized medially and  the free fragment extruded into the operative field.  This was removed with  a pituitary.  Several more fragments of disk were removed in a similar  fashion.  We then explored the interspace.  The disk was collapsed and we  could only get a Penfield into the disk space itself.  We examined the nerve  again using a blunt probe and found it to be free from its takeoff out the  respective foramen.  The wound was copiously irrigated.  Bleeding was  controlled with __________ and bipolar electrocautery.  The overhanging S1  superior facet was cut flush with the pedicle decompressing the lateral  recess.  We felt we had good decompression of the nerve root.  The wound was  again irrigated.  2 mL of fentanyl left in the  epidural space for postoperative anesthesia.  Deep fascia closed with 0  Vicryl, the subcutaneous layer closed with 2-0 Vicryl followed by Dermabond  to approximate the skin edges.  The patient was turned supine, extubated  without difficulty and transported to the recovery room in stable condition,  able to move the upper and lower extremities.                                               Sharolyn Douglas, M.D.    MC/MEDQ  D:  06/26/2003  T:  06/27/2003  Job:  [id number]

## 2010-07-24 NOTE — Op Note (Signed)
[facility]  Patient:    Rachel Luna, Rachel Luna                       MRN: [id number] Proc. Date: 11/19/99 Adm. Date:  [id number] Disc. Date: [id number] Attending:  Andre Lefort CC:         Gretta Arab. Valentina Lucks, M.D.   Operative Report  PREOPERATIVE DIAGNOSIS:       Symptomatic cholelithiasis.  POSTOPERATIVE DIAGNOSIS:      Chronic cholecystitis and cholelithiasis.  PROCEDURE:                    Laparoscopic cholecystectomy with intraoperative                               cholangiogram.  SURGEON:                      Sandria Bales. Ezzard Standing, M.D.  FIRST ASSISTANT:              Angelia Mould. Derrell Lolling, M.D.  ANESTHESIA:                   General endotracheal, with approximately 10 cc                               of 0.25% Marcaine.  COMPLICATIONS:                None.  INDICATIONS:                  Ms. Shaver is a 55 year old white female who has had vague right-sided right flank, with gallstones by ultrasound.  She now comes for attempted laparoscopic cholecystectomy.  OPERATIVE NOTE:               The patient was placed in a supine position, given a general intravenous sedation, supervised by Dr. Geraldine Solar.  She was given 1 g of Ancef at the beginning of this procedure.  She had PAS, stockings were placed.  Her abdomen was prepped with Betadine solution and sterilely draped.  An infraumbilical incision was made with sharp dissection, carried down into the abdominal cavity.  A 0-degree 10 mm laparoscope was inserted through a 12 mm Hasson trocar.  The Hasson trocar was secured with a 0 Vicryl suture. The abdomen was then insufflated with CO2 to a pressure of about 12-14 cm.  Abdominal exploration revealed the right and left lobes of the liver to be unremarkable.  The anterior wall of the stomach was unremarkable.  The remainder of the abdominal examination was limited by the omentum; however there was no other suspicious or abdominal mass (other than we  did identify what appeared to be about a 4 cm left ovarian cyst).  Three additional trocars were placed; a 10 mm Ethicon trocar in the subxiphoid location, a 5 mm Ethicon trocar in the right mid subcostal location, and a 5 mm Ethicon trocar in the right lateral subcostal location.  The gallbladder was grasped and rotated cephalad.  Dissection was carried down along the triangle of Calot, identifying first the cystic duct and then cystic artery. The cystic artery was triply endoclipped and divided.  A clip was placed in the gallbladder side of the cystic duct, and intraoperative cholangiogram was obtained.  The intraoperative cholangiogram was obtained using a cutoff taut  catheter inserted through a 14-gauge ________; then an incision was made into the side of the cystic duct and the taut catheter inserted into the cystic duct and secured with a clip.  The intraoperative cholangiogram was shot under fluoroscopy using half-strength TyOpaque solution.  This showed free flow down the cystic duct, down the common bile duct, into the duodenum; there was no obstruction and no mass -- felt to be a normal intraoperative cholangiogram.  The taut catheter was then removed.  The cystic duct was triply endoclipped and divided.  The gallbladder was then sharply and bluntly dissected from the gallbladder bed.  There were a couple of additional vessels were endoclipped. Prior to the complete removal of the gallbladder from the gallbladder bed, the triangle of Calot and the gallbladder bed were visualized, there was no bowel leak and no bleeding.  The gallbladder was then divided, delivered through the umbilicus and sent to pathology.  The abdomen was irrigated with saline.  The trocar sites were all visualized. There was no bleeding in the interim or at the site of the umbilical trocar. This was closed with 0 Vicryl.  The skin exiting site was closed with a 5-0 Vicryl suture, painted with tincture of  Benzoin Steri-Strips, and then sterilely dressed.  The patient tolerated the procedure well and was transferred to the recovery room in good position.  Sponge, needle and instrument counts were correct. DD:  11/19/99 TD:  11/20/99 Job: [id number]

## 2010-08-04 ENCOUNTER — Ambulatory Visit: Payer: Self-pay | Admitting: Pain Medicine

## 2010-08-10 ENCOUNTER — Ambulatory Visit: Payer: Self-pay | Admitting: Pain Medicine

## 2010-08-17 ENCOUNTER — Ambulatory Visit: Payer: Self-pay | Admitting: Pain Medicine

## 2010-08-19 ENCOUNTER — Ambulatory Visit
Admission: RE | Admit: 2010-08-19 | Discharge: 2010-08-19 | Disposition: A | Discharge status: Home / Self Care | Payer: PRIVATE HEALTH INSURANCE | Source: Ambulatory Visit | Attending: Family Medicine | Admitting: Family Medicine

## 2010-08-19 DIAGNOSIS — Z09 Encounter for follow-up examination after completed treatment for conditions other than malignant neoplasm: Secondary | ICD-10-CM

## 2010-09-08 ENCOUNTER — Other Ambulatory Visit: Payer: Self-pay | Admitting: Neurosurgery

## 2010-09-08 DIAGNOSIS — M545 Low back pain: Secondary | ICD-10-CM

## 2010-09-08 DIAGNOSIS — M5136 Other intervertebral disc degeneration, lumbar region: Secondary | ICD-10-CM

## 2010-09-16 ENCOUNTER — Ambulatory Visit
Admission: RE | Admit: 2010-09-16 | Discharge: 2010-09-16 | Disposition: A | Discharge status: Home / Self Care | Payer: PRIVATE HEALTH INSURANCE | Source: Ambulatory Visit | Attending: Neurosurgery | Admitting: Neurosurgery

## 2010-09-16 DIAGNOSIS — M545 Low back pain, unspecified: Secondary | ICD-10-CM

## 2010-09-16 DIAGNOSIS — M51369 Other intervertebral disc degeneration, lumbar region without mention of lumbar back pain or lower extremity pain: Secondary | ICD-10-CM

## 2010-09-16 DIAGNOSIS — M5136 Other intervertebral disc degeneration, lumbar region: Secondary | ICD-10-CM

## 2010-09-16 MED ORDER — GADOBENATE DIMEGLUMINE 529 MG/ML IV SOLN
20.0000 mL | Freq: Once | INTRAVENOUS | Status: AC | PRN
  Administered 2010-09-16: 20 mL via INTRAVENOUS

## 2010-12-03 LAB — BASIC METABOLIC PANEL
BUN: 9
CO2: 25
Chloride: 107
GFR calc Af Amer: >60
Potassium: 4.3

## 2010-12-03 LAB — CBC
HCT: 37.7
Hemoglobin: 12.7
MCHC: 33.8
RDW: 13.2

## 2010-12-03 LAB — DIFFERENTIAL
Basophils Absolute: 0
Basophils Relative: 1
Eosinophils Relative: 3
Monocytes Absolute: 0.4
Neutro Abs: 3.1

## 2010-12-09 LAB — POCT I-STAT, CHEM 8
Calcium, Ion: 1.2
HCT: 39
TCO2: 24

## 2010-12-13 ENCOUNTER — Emergency Department (HOSPITAL_COMMUNITY)
Admission: EM | Admit: 2010-12-13 | Discharge: 2010-12-13 | Disposition: A | Discharge status: Home / Self Care | Payer: PRIVATE HEALTH INSURANCE | Attending: Emergency Medicine | Admitting: Emergency Medicine

## 2010-12-13 ENCOUNTER — Emergency Department (HOSPITAL_COMMUNITY): Payer: PRIVATE HEALTH INSURANCE

## 2010-12-13 DIAGNOSIS — R6884 Jaw pain: Secondary | ICD-10-CM | POA: Insufficient documentation

## 2010-12-13 DIAGNOSIS — K047 Periapical abscess without sinus: Secondary | ICD-10-CM | POA: Insufficient documentation

## 2010-12-13 DIAGNOSIS — I10 Essential (primary) hypertension: Secondary | ICD-10-CM | POA: Insufficient documentation

## 2010-12-13 DIAGNOSIS — K089 Disorder of teeth and supporting structures, unspecified: Secondary | ICD-10-CM | POA: Insufficient documentation

## 2010-12-13 DIAGNOSIS — G589 Mononeuropathy, unspecified: Secondary | ICD-10-CM | POA: Insufficient documentation

## 2010-12-13 DIAGNOSIS — R220 Localized swelling, mass and lump, head: Secondary | ICD-10-CM | POA: Insufficient documentation

## 2010-12-13 DIAGNOSIS — R221 Localized swelling, mass and lump, neck: Secondary | ICD-10-CM | POA: Insufficient documentation

## 2010-12-13 LAB — DIFFERENTIAL
Basophils Relative: 1 % (ref 0–1)
Eosinophils Absolute: 0.6 10*3/uL (ref 0.0–0.7)
Neutrophils Relative %: 64 % (ref 43–77)

## 2010-12-13 LAB — CBC
Platelets: 281 10*3/uL (ref 150–400)
RBC: 4.05 MIL/uL (ref 3.87–5.11)
WBC: 8.8 10*3/uL (ref 4.0–10.5)

## 2010-12-13 LAB — BASIC METABOLIC PANEL
CO2: 29 mEq/L (ref 19–32)
Calcium: 9.5 mg/dL (ref 8.4–10.5)
Chloride: 104 mEq/L (ref 96–112)
Sodium: 140 mEq/L (ref 135–145)

## 2010-12-13 MED ORDER — IOHEXOL 300 MG/ML  SOLN
100.0000 mL | Freq: Once | INTRAMUSCULAR | Status: AC | PRN
  Administered 2010-12-13: 100 mL via INTRAVENOUS

## 2010-12-16 LAB — URIC ACID: Uric Acid, Serum: 4.2

## 2010-12-21 ENCOUNTER — Other Ambulatory Visit: Payer: Self-pay | Admitting: Family Medicine

## 2010-12-21 DIAGNOSIS — Z1231 Encounter for screening mammogram for malignant neoplasm of breast: Secondary | ICD-10-CM

## 2011-01-13 ENCOUNTER — Ambulatory Visit
Admission: RE | Admit: 2011-01-13 | Discharge: 2011-01-13 | Disposition: A | Discharge status: Home / Self Care | Payer: PRIVATE HEALTH INSURANCE | Source: Ambulatory Visit | Attending: Family Medicine | Admitting: Family Medicine

## 2011-01-13 DIAGNOSIS — Z1231 Encounter for screening mammogram for malignant neoplasm of breast: Secondary | ICD-10-CM

## 2011-04-06 DIAGNOSIS — M1710 Unilateral primary osteoarthritis, unspecified knee: Secondary | ICD-10-CM | POA: Insufficient documentation

## 2011-07-16 ENCOUNTER — Ambulatory Visit: Payer: PRIVATE HEALTH INSURANCE | Attending: Orthopedic Surgery | Admitting: Physical Therapy

## 2011-09-27 ENCOUNTER — Other Ambulatory Visit: Payer: Self-pay | Admitting: Orthopedic Surgery

## 2011-09-27 DIAGNOSIS — R609 Edema, unspecified: Secondary | ICD-10-CM

## 2011-09-28 ENCOUNTER — Other Ambulatory Visit: Payer: PRIVATE HEALTH INSURANCE

## 2011-09-30 ENCOUNTER — Other Ambulatory Visit: Payer: PRIVATE HEALTH INSURANCE

## 2011-10-04 ENCOUNTER — Ambulatory Visit
Admission: RE | Admit: 2011-10-04 | Discharge: 2011-10-04 | Disposition: A | Discharge status: Home / Self Care | Payer: PRIVATE HEALTH INSURANCE | Source: Ambulatory Visit | Attending: Orthopedic Surgery | Admitting: Orthopedic Surgery

## 2011-10-04 DIAGNOSIS — R609 Edema, unspecified: Secondary | ICD-10-CM

## 2011-12-21 ENCOUNTER — Other Ambulatory Visit: Payer: Self-pay | Admitting: Family Medicine

## 2012-01-21 ENCOUNTER — Other Ambulatory Visit: Payer: Self-pay | Admitting: Family Medicine

## 2012-02-10 ENCOUNTER — Other Ambulatory Visit: Payer: Self-pay | Admitting: Family Medicine

## 2012-02-10 DIAGNOSIS — Z780 Asymptomatic menopausal state: Secondary | ICD-10-CM

## 2012-02-10 DIAGNOSIS — Z1231 Encounter for screening mammogram for malignant neoplasm of breast: Secondary | ICD-10-CM

## 2012-03-15 ENCOUNTER — Other Ambulatory Visit: Payer: PRIVATE HEALTH INSURANCE

## 2012-03-15 ENCOUNTER — Ambulatory Visit: Payer: PRIVATE HEALTH INSURANCE

## 2012-04-06 ENCOUNTER — Ambulatory Visit: Payer: PRIVATE HEALTH INSURANCE

## 2012-04-06 ENCOUNTER — Other Ambulatory Visit: Payer: PRIVATE HEALTH INSURANCE

## 2012-05-03 ENCOUNTER — Other Ambulatory Visit: Payer: PRIVATE HEALTH INSURANCE

## 2012-05-03 ENCOUNTER — Ambulatory Visit: Payer: PRIVATE HEALTH INSURANCE

## 2012-06-08 ENCOUNTER — Other Ambulatory Visit: Payer: Self-pay | Admitting: Orthopedic Surgery

## 2012-06-08 DIAGNOSIS — R609 Edema, unspecified: Secondary | ICD-10-CM

## 2012-06-08 DIAGNOSIS — M79604 Pain in right leg: Secondary | ICD-10-CM

## 2012-06-09 ENCOUNTER — Other Ambulatory Visit: Payer: PRIVATE HEALTH INSURANCE

## 2012-06-12 ENCOUNTER — Other Ambulatory Visit: Payer: PRIVATE HEALTH INSURANCE

## 2012-06-29 ENCOUNTER — Ambulatory Visit: Payer: Self-pay | Admitting: Obstetrics & Gynecology

## 2012-08-14 ENCOUNTER — Encounter: Payer: Self-pay | Admitting: Obstetrics & Gynecology

## 2012-08-14 ENCOUNTER — Ambulatory Visit (INDEPENDENT_AMBULATORY_CARE_PROVIDER_SITE_OTHER): Payer: 59 | Admitting: Obstetrics & Gynecology

## 2012-08-14 VITALS — BP 111/72 | HR 73 | Temp 98.3°F | Ht 64.0 in | Wt 195.6 lb

## 2012-08-14 DIAGNOSIS — N951 Menopausal and female climacteric states: Secondary | ICD-10-CM | POA: Insufficient documentation

## 2012-08-14 MED ORDER — ESTROGENS CONJUGATED 0.625 MG PO TABS: 0.6250 mg | ORAL_TABLET | Freq: Every day | ORAL | Status: DC

## 2012-08-14 NOTE — Patient Instructions (Signed)

## 2012-08-14 NOTE — Progress Notes (Signed)
.   Subjective:     Rachel Luna is a 57 y.o. female here for a follow up exam for her HRT.  She was taking Premarin 0.9 tablets and would like to restart them.  Personal health questionnaire reviewed: no.   Gynecologic History No LMP recorded. Patient has had a hysterectomy. ( Fibroids) Contraception: none Last mammogram: 03/2011.  Results: normal  Obstetric History OB History   Grav Para Term Preterm Abortions TAB SAB Ect Mult Living                   The following portions of the patient's history were reviewed and updated as appropriate: allergies, current medications, past family history, past medical history, past social history, past surgical history and problem list.  Review of Systems Pertinent items are noted in HPI.    Objective:     No exam today     Assessment:   Menopausal, symptomatic  Plan:    Resume ERT

## 2012-08-22 ENCOUNTER — Encounter: Payer: Self-pay | Admitting: Obstetrics & Gynecology

## 2012-09-05 ENCOUNTER — Other Ambulatory Visit: Payer: Self-pay | Admitting: Family Medicine

## 2012-09-05 DIAGNOSIS — Z1231 Encounter for screening mammogram for malignant neoplasm of breast: Secondary | ICD-10-CM

## 2012-09-25 ENCOUNTER — Ambulatory Visit
Admission: RE | Admit: 2012-09-25 | Discharge: 2012-09-25 | Disposition: A | Discharge status: Home / Self Care | Payer: PRIVATE HEALTH INSURANCE | Source: Ambulatory Visit | Attending: Family Medicine | Admitting: Family Medicine

## 2012-09-25 DIAGNOSIS — Z1231 Encounter for screening mammogram for malignant neoplasm of breast: Secondary | ICD-10-CM

## 2012-09-25 DIAGNOSIS — Z780 Asymptomatic menopausal state: Secondary | ICD-10-CM

## 2012-09-26 ENCOUNTER — Other Ambulatory Visit: Payer: Self-pay | Admitting: Family Medicine

## 2012-09-26 DIAGNOSIS — R928 Other abnormal and inconclusive findings on diagnostic imaging of breast: Secondary | ICD-10-CM

## 2012-10-02 ENCOUNTER — Other Ambulatory Visit: Payer: Self-pay | Admitting: Neurology

## 2012-10-10 ENCOUNTER — Ambulatory Visit
Admission: RE | Admit: 2012-10-10 | Discharge: 2012-10-10 | Disposition: A | Discharge status: Home / Self Care | Payer: PRIVATE HEALTH INSURANCE | Source: Ambulatory Visit | Attending: Family Medicine | Admitting: Family Medicine

## 2012-10-10 DIAGNOSIS — R928 Other abnormal and inconclusive findings on diagnostic imaging of breast: Secondary | ICD-10-CM

## 2012-10-22 ENCOUNTER — Other Ambulatory Visit: Payer: Self-pay | Admitting: Neurology

## 2012-10-25 ENCOUNTER — Other Ambulatory Visit: Payer: PRIVATE HEALTH INSURANCE

## 2012-11-03 ENCOUNTER — Other Ambulatory Visit: Payer: Self-pay

## 2012-11-03 MED ORDER — CARBAMAZEPINE 200 MG PO TABS: 300.0000 mg | ORAL_TABLET | Freq: Two times a day (BID) | ORAL | Status: DC

## 2012-11-28 ENCOUNTER — Telehealth: Payer: Self-pay | Admitting: Neurology

## 2012-11-28 MED ORDER — PRAMIPEXOLE DIHYDROCHLORIDE 0.25 MG PO TABS: 0.7500 mg | ORAL_TABLET | Freq: Every evening | ORAL | Status: DC

## 2012-11-28 NOTE — Telephone Encounter (Signed)
Pharmacy is unable to get Mirapex 0.75 at this time.  They have 0.25 available.  Rx has been updated to 0.25 three at night to total the 0.75 mg dose needed.

## 2012-11-29 ENCOUNTER — Telehealth: Payer: Self-pay | Admitting: Neurology

## 2012-11-29 MED ORDER — PRAMIPEXOLE DIHYDROCHLORIDE 0.25 MG PO TABS: 0.7500 mg | ORAL_TABLET | Freq: Every evening | ORAL | Status: DC

## 2012-11-29 NOTE — Telephone Encounter (Signed)
Rx resent.

## 2012-12-01 ENCOUNTER — Other Ambulatory Visit: Payer: Self-pay | Admitting: *Deleted

## 2012-12-01 MED ORDER — ESTROGENS CONJUGATED 0.625 MG PO TABS: 0.6250 mg | ORAL_TABLET | Freq: Every day | ORAL | Status: DC

## 2012-12-04 ENCOUNTER — Ambulatory Visit: Payer: 59 | Admitting: Obstetrics & Gynecology

## 2012-12-06 ENCOUNTER — Other Ambulatory Visit: Payer: Self-pay | Admitting: *Deleted

## 2012-12-06 MED ORDER — ESTRADIOL 1 MG PO TABS: 1.0000 mg | ORAL_TABLET | Freq: Every day | ORAL | Status: DC

## 2012-12-07 ENCOUNTER — Encounter: Payer: Self-pay | Admitting: Obstetrics & Gynecology

## 2013-02-16 ENCOUNTER — Encounter (INDEPENDENT_AMBULATORY_CARE_PROVIDER_SITE_OTHER): Payer: Self-pay

## 2013-02-16 ENCOUNTER — Encounter: Payer: Self-pay | Admitting: Nurse Practitioner

## 2013-02-16 ENCOUNTER — Ambulatory Visit (INDEPENDENT_AMBULATORY_CARE_PROVIDER_SITE_OTHER): Payer: 59 | Admitting: Nurse Practitioner

## 2013-02-16 VITALS — BP 126/80 | HR 83 | Ht 66.0 in | Wt 245.0 lb

## 2013-02-16 DIAGNOSIS — G2581 Restless legs syndrome: Secondary | ICD-10-CM

## 2013-02-16 DIAGNOSIS — R209 Unspecified disturbances of skin sensation: Secondary | ICD-10-CM

## 2013-02-16 MED ORDER — GABAPENTIN 800 MG PO TABS: 800.0000 mg | ORAL_TABLET | Freq: Three times a day (TID) | ORAL | Status: DC

## 2013-02-16 MED ORDER — BACLOFEN 10 MG PO TABS: 10.0000 mg | ORAL_TABLET | Freq: Three times a day (TID) | ORAL | Status: DC

## 2013-02-16 MED ORDER — CARBAMAZEPINE 200 MG PO TABS: 300.0000 mg | ORAL_TABLET | Freq: Two times a day (BID) | ORAL | Status: DC

## 2013-02-16 NOTE — Progress Notes (Signed)
[facility]  PATIENT: Rachel Luna DOB: [birth year]   REASON FOR VISIT: Followup for restless legs   HISTORY OF PRESENT ILLNESS: Rachel Luna, 57 year old female returns for followup she has a history of restless leg syndrome that is currently well controlled on baclofen, gabapentin, carbamazepine and Mirapex. The patient states that she has her primary care do the routine labs Rachel Luna. She has not fallen, denies any side effects from her medications, no new neurologic complaints.  HISTORY: Restless legs syndrome .  The patient has recently had knee surgery in the right, and she is still recovering from this. The patient is on carbamazepine, baclofen, and gabapentin as well as Mirapex. The patient is doing well currently, and she is sleeping well at nighttime. The patient not having much pain with ambulation at this time. The patient returns to this office for an evaluation. The patient denies any gait instability. The patient indicates that she just had blood work done through her primary care physician, and she will have this blood work faxed to this office.   REVIEW OF SYSTEMS: Full 14 system review of systems performed and notable only for those listed, all others are neg:  Constitutional: Fatigue  Cardiovascular: N/A  Ear/Nose/Throat: N/A  Skin: N/A  Eyes: Itchy eyes Respiratory: N/A  Gastroitestinal: N/A  Hematology/Lymphatic: N/A  Endocrine: Excessive thirst Musculoskeletal: Joint pain, muscle cramps Allergy/Immunology: N/A  Neurological: Restless legs Psychiatric: Depression anxiety, hyperactivity   ALLERGIES: Allergies  Allergen Reactions  . Lisinopril Cough    HOME MEDICATIONS: Outpatient Prescriptions Prior to Visit  Medication Sig Dispense Refill  . amitriptyline (ELAVIL) 25 MG tablet Take 25 mg by mouth at bedtime.       . baclofen (LIORESAL) 10 MG tablet take 1 tablet by mouth twice a day  60 tablet  5  . carbamazepine (TEGRETOL) 200  MG tablet Take 1.5 tablets (300 mg total) by mouth 2 (two) times daily.  90 tablet  5  . DULoxetine (CYMBALTA) 60 MG capsule Take 60 mg by mouth 2 (two) times daily.       Marland Kitchen estradiol (ESTRACE) 1 MG tablet Take 1 tablet (1 mg total) by mouth daily.  30 tablet  5  . fenofibrate (TRICOR) 145 MG tablet Take 145 mg by mouth daily.       . fluticasone (FLONASE) 50 MCG/ACT nasal spray Place 2 sprays into the nose daily.       Marland Kitchen gabapentin (NEURONTIN) 800 MG tablet Take 800 mg by mouth 3 (three) times daily.       Marland Kitchen losartan (COZAAR) 25 MG tablet Take 25 mg by mouth daily.       . meloxicam (MOBIC) 15 MG tablet Take 15 mg by mouth daily.       Marland Kitchen omeprazole (PRILOSEC) 20 MG capsule Take 20 mg by mouth daily.       . pramipexole (MIRAPEX) 0.25 MG tablet Take 3 tablets (0.75 mg total) by mouth every evening.  90 tablet  3  . pramipexole (MIRAPEX) 0.75 MG tablet take 1 tablet by mouth EVERY NIGHT  30 tablet  4  . estrogens, conjugated, (PREMARIN) 0.625 MG tablet Take 1 tablet (0.625 mg total) by mouth daily.  30 tablet  3   No facility-administered medications prior to visit.    PAST MEDICAL HISTORY: Past Medical History  Diagnosis Date  . Hypertension   . Depression   . Bipolar disorder   . Arthritis   . Hyperlipidemia   .  GERD (gastroesophageal reflux disease)   . Gout   . Fibroids   . chronic low back pain   . Obesity     PAST SURGICAL HISTORY: Past Surgical History  Procedure Laterality Date  . Spine surgery    . Knee arthroplasty    . Cholecystectomy    . Ovarian cyst removal    . Abdominal hysterectomy      FAMILY HISTORY: Family History  Problem Relation Age of Onset  . Diabetes Mother   . Cancer Mother     breast  . Heart disease Mother   . Mental illness Mother     bipolar disorder  . Diabetes Father     SOCIAL HISTORY: History   Social History  . Marital Status: Divorced    Spouse Name: N/A    Number of Children: 0  . Years of Education: 10   Occupational  History  .     Social History Main Topics  . Smoking status: Never Smoker   . Smokeless tobacco: Never Used  . Alcohol Use: No  . Drug Use: No  . Sexual Activity: No   Other Topics Concern  . Not on file   Social History Narrative   Patient is divorced and lives with a roommate.   Patient is on disability.   Patient has a 10th grade education.   Patient is right-handed.   Patient drinks a 2 liter of soda daily.              PHYSICAL EXAM  Filed Vitals:   02/16/13 0952  BP: 126/80  Pulse: 83  Height: 5\' 6"  (1.676 m)  Weight: 245 lb (111.131 kg)   Body mass index is 39.56 kg/(m^2).  Generalized: Well developed, in no acute distress   Neurological examination   Mentation: Alert oriented to time, place, history taking. Follows all commands speech and language fluent  Cranial nerve II-XII: Pupils were equal round reactive to light extraocular movements were full, visual field were full on confrontational test. Facial sensation and strength were normal. hearing was intact to finger rubbing bilaterally. Uvula tongue midline. head turning and shoulder shrug were normal and symmetric.Tongue protrusion into cheek strength was normal. Motor: normal bulk and tone, full strength in the BUE, BLE, fine finger movements normal, no pronator drift. No focal weakness Coordination: finger-nose-finger, heel-to-shin bilaterally, no dysmetria Reflexes: Depressed and symmetric upper and lower Gait and Station: Rising up from seated position without assistance, normal stance,  moderate stride, good arm swing, smooth turning, able to perform tiptoe, and heel walking without difficulty. Tandem gait is slightly unsteady   DIAGNOSTIC DATA (LABS, IMAGING, TESTING) -None to review    ASSESSMENT AND PLAN  57 y.o. year old female  has a past medical history of Hypertension; Depression; Bipolar disorder; Arthritis; Hyperlipidemia; GERD (gastroesophageal reflux disease); Gout; Fibroids; chronic  low back pain; and Obesity. here to followup for restless leg syndrome.  Pt will continue current meds. Will refill Gabapentin and Baclofen and CBZ, does not need Mirapex refilled. (There are 2 different dosages for Mirapex because the drug store frequently runs out of one or the other, she takes .75mg  hs. ) Have Dr. Mardelle Matte send labs when done F/U yearly Nilda Riggs, [facility], [facility], APRN  [facility] [address], [address] [place], [place] [address] [phone]

## 2013-02-16 NOTE — Progress Notes (Signed)
I have read the note, and I agree with the clinical assessment and plan.  Neelam Tiggs KEITH   

## 2013-02-16 NOTE — Patient Instructions (Addendum)
Pt will continue current meds. Will refill Gabapentin and Baclofen Have Dr. Mardelle Matte send labs when done F/U yearly

## 2013-03-08 ENCOUNTER — Other Ambulatory Visit: Payer: Self-pay | Admitting: Neurology

## 2013-03-12 DIAGNOSIS — K219 Gastro-esophageal reflux disease without esophagitis: Secondary | ICD-10-CM | POA: Insufficient documentation

## 2013-03-19 ENCOUNTER — Ambulatory Visit (INDEPENDENT_AMBULATORY_CARE_PROVIDER_SITE_OTHER): Payer: 59 | Admitting: Obstetrics & Gynecology

## 2013-03-19 ENCOUNTER — Encounter: Payer: Self-pay | Admitting: Obstetrics & Gynecology

## 2013-03-19 VITALS — BP 123/83 | HR 81 | Temp 97.0°F | Ht 65.0 in | Wt 252.0 lb

## 2013-03-19 DIAGNOSIS — Z01419 Encounter for gynecological examination (general) (routine) without abnormal findings: Secondary | ICD-10-CM

## 2013-03-19 DIAGNOSIS — N390 Urinary tract infection, site not specified: Secondary | ICD-10-CM

## 2013-03-19 LAB — POCT URINALYSIS DIPSTICK
Bilirubin, UA: NEGATIVE
Blood, UA: NEGATIVE
Glucose, UA: NEGATIVE
Ketones, UA: NEGATIVE
LEUKOCYTES UA: NEGATIVE
NITRITE UA: NEGATIVE
Protein, UA: NEGATIVE
Spec Grav, UA: 1.01
UROBILINOGEN UA: NEGATIVE
pH, UA: 7

## 2013-03-19 NOTE — Addendum Note (Signed)
Addended by: Lewie Loron D on: 03/19/2013 10:47 AM   Modules accepted: Orders

## 2013-03-19 NOTE — Progress Notes (Signed)
  Subjective:    Rachel Luna is a 58 y.o. female who presents for an annual exam. The patient has no complaints today. The patient is not currently sexually active. GYN screening history: last pap: was normal and last mammogram: was normal. The patient wears seatbelts: yes. The patient participates in regular exercise: no. Has the patient ever been transfused or tattooed?: no. The patient reports that there is not domestic violence in her life.   Menstrual History: OB History   Grav Para Term Preterm Abortions TAB SAB Ect Mult Living   0 0 0 0 0 0 0 0 0 0        No LMP recorded. Patient has had a hysterectomy.    The following portions of the patient's history were reviewed and updated as appropriate: allergies, current medications, past family history, past medical history, past social history, past surgical history and problem list.  Review of Systems Pertinent items are noted in HPI.    Objective:   General appearance: alert Breasts: normal appearance, no masses or tenderness Abdomen: soft, non-tender; bowel sounds normal; no masses,  no organomegaly Pelvic: external genitalia normal, no adnexal masses or tenderness and vagina normal without discharge      Assessment:    Healthy female exam.    Plan:     Follow up as needed.

## 2013-03-19 NOTE — Patient Instructions (Addendum)
Health Maintenance, Female A healthy lifestyle and preventative care can promote health and wellness.  Maintain regular health, dental, and eye exams.  Eat a healthy diet. Foods like vegetables, fruits, whole grains, low-fat dairy products, and lean protein foods contain the nutrients you need without too many calories. Decrease your intake of foods high in solid fats, added sugars, and salt. Get information about a proper diet from your caregiver, if necessary.  Regular physical exercise is one of the most important things you can do for your health. Most adults should get at least 150 minutes of moderate-intensity exercise (any activity that increases your heart rate and causes you to sweat) each week. In addition, most adults need muscle-strengthening exercises on 2 or more days a week.   Maintain a healthy weight. The body mass index (BMI) is a screening tool to identify possible weight problems. It provides an estimate of body fat based on height and weight. Your caregiver can help determine your BMI, and can help you achieve or maintain a healthy weight. For adults 20 years and older:  A BMI below 18.5 is considered underweight.  A BMI of 18.5 to 24.9 is normal.  A BMI of 25 to 29.9 is considered overweight.  A BMI of 30 and above is considered obese.  Maintain normal blood lipids and cholesterol by exercising and minimizing your intake of saturated fat. Eat a balanced diet with plenty of fruits and vegetables. Blood tests for lipids and cholesterol should begin at age 20 and be repeated every 5 years. If your lipid or cholesterol levels are high, you are over 50, or you are a high risk for heart disease, you may need your cholesterol levels checked more frequently.Ongoing high lipid and cholesterol levels should be treated with medicines if diet and exercise are not effective.  If you smoke, find out from your caregiver how to quit. If you do not use tobacco, do not start.  Lung  cancer screening is recommended for adults aged 55 80 years who are at high risk for developing lung cancer because of a history of smoking. Yearly low-dose computed tomography (CT) is recommended for people who have at least a 30-pack-year history of smoking and are a current smoker or have quit within the past 15 years. A pack year of smoking is smoking an average of 1 pack of cigarettes a day for 1 year (for example: 1 pack a day for 30 years or 2 packs a day for 15 years). Yearly screening should continue until the smoker has stopped smoking for at least 15 years. Yearly screening should also be stopped for people who develop a health problem that would prevent them from having lung cancer treatment.  If you are pregnant, do not drink alcohol. If you are breastfeeding, be very cautious about drinking alcohol. If you are not pregnant and choose to drink alcohol, do not exceed 1 drink per day. One drink is considered to be 12 ounces (355 mL) of beer, 5 ounces (148 mL) of wine, or 1.5 ounces (44 mL) of liquor.  Avoid use of street drugs. Do not share needles with anyone. Ask for help if you need support or instructions about stopping the use of drugs.  High blood pressure causes heart disease and increases the risk of stroke. Blood pressure should be checked at least every 1 to 2 years. Ongoing high blood pressure should be treated with medicines, if weight loss and exercise are not effective.  If you are 55 to   56 years old, ask your caregiver if you should take aspirin to prevent strokes.  Diabetes screening involves taking a blood sample to check your fasting blood sugar level. This should be done once every 3 years, after age 45, if you are within normal weight and without risk factors for diabetes. Testing should be considered at a younger age or be carried out more frequently if you are overweight and have at least 1 risk factor for diabetes.  Breast cancer screening is essential preventative care  for women. You should practice "breast self-awareness." This means understanding the normal appearance and feel of your breasts and may include breast self-examination. Any changes detected, no matter how small, should be reported to a caregiver. Women in their 20s and 30s should have a clinical breast exam (CBE) by a caregiver as part of a regular health exam every 1 to 3 years. After age 40, women should have a CBE every year. Starting at age 40, women should consider having a mammogram (breast X-ray) every year. Women who have a family history of breast cancer should talk to their caregiver about genetic screening. Women at a high risk of breast cancer should talk to their caregiver about having an MRI and a mammogram every year.  Breast cancer gene (BRCA)-related cancer risk assessment is recommended for women who have family members with BRCA-related cancers. BRCA-related cancers include breast, ovarian, tubal, and peritoneal cancers. Having family members with these cancers may be associated with an increased risk for harmful changes (mutations) in the breast cancer genes BRCA1 and BRCA2. Results of the assessment will determine the need for genetic counseling and BRCA1 and BRCA2 testing.  The Pap test is a screening test for cervical cancer. Women should have a Pap test starting at age 21. Between ages 21 and 29, Pap tests should be repeated every 2 years. Beginning at age 30, you should have a Pap test every 3 years as long as the past 3 Pap tests have been normal. If you had a hysterectomy for a problem that was not cancer or a condition that could lead to cancer, then you no longer need Pap tests. If you are between ages 65 and 70, and you have had normal Pap tests going back 10 years, you no longer need Pap tests. If you have had past treatment for cervical cancer or a condition that could lead to cancer, you need Pap tests and screening for cancer for at least 20 years after your treatment. If Pap  tests have been discontinued, risk factors (such as a new sexual partner) need to be reassessed to determine if screening should be resumed. Some women have medical problems that increase the chance of getting cervical cancer. In these cases, your caregiver may recommend more frequent screening and Pap tests.  The human papillomavirus (HPV) test is an additional test that may be used for cervical cancer screening. The HPV test looks for the virus that can cause the cell changes on the cervix. The cells collected during the Pap test can be tested for HPV. The HPV test could be used to screen women aged 30 years and older, and should be used in women of any age who have unclear Pap test results. After the age of 30, women should have HPV testing at the same frequency as a Pap test.  Colorectal cancer can be detected and often prevented. Most routine colorectal cancer screening begins at the age of 50 and continues through age 75. However, your caregiver   may recommend screening at an earlier age if you have risk factors for colon cancer. On a yearly basis, your caregiver may provide home test kits to check for hidden blood in the stool. Use of a small camera at the end of a tube, to directly examine the colon (sigmoidoscopy or colonoscopy), can detect the earliest forms of colorectal cancer. Talk to your caregiver about this at age 59, when routine screening begins. Direct examination of the colon should be repeated every 5 to 10 years through age 54, unless early forms of pre-cancerous polyps or small growths are found.  Hepatitis C blood testing is recommended for all people born from [birth year] through 1963 and any individual with known risks for hepatitis C.  Practice safe sex. Use condoms and avoid high-risk sexual practices to reduce the spread of sexually transmitted infections (STIs). Sexually active women aged 33 and younger should be checked for Chlamydia, which is a common sexually transmitted infection.  Older women with new or multiple partners should also be tested for Chlamydia. Testing for other STIs is recommended if you are sexually active and at increased risk.  Osteoporosis is a disease in which the bones lose minerals and strength with aging. This can result in serious bone fractures. The risk of osteoporosis can be identified using a bone density scan. Women ages 60 and over and women at risk for fractures or osteoporosis should discuss screening with their caregivers. Ask your caregiver whether you should be taking a calcium supplement or vitamin D to reduce the rate of osteoporosis.  Menopause can be associated with physical symptoms and risks. Hormone replacement therapy is available to decrease symptoms and risks. You should talk to your caregiver about whether hormone replacement therapy is right for you.  Use sunscreen. Apply sunscreen liberally and repeatedly throughout the day. You should seek shade when your shadow is shorter than you. Protect yourself by wearing long sleeves, pants, a wide-brimmed hat, and sunglasses year round, whenever you are outdoors.  Notify your caregiver of new moles or changes in moles, especially if there is a change in shape or color. Also notify your caregiver if a mole is larger than the size of a pencil eraser.  Stay current with your immunizations. Document Released: 09/07/2010 Document Revised: 06/19/2012 Document Reviewed: 09/07/2010 [facility] Patient Information 2014 [place].  Bone Health Our bones do many things. They provide structure, protect organs, anchor muscles, and store calcium. Adequate calcium in your diet and weight-bearing physical activity help build strong bones, improve bone amounts, and may reduce the risk of weakening of bones (osteoporosis) later in life. PEAK BONE MASS By age 15, the average woman has acquired most of her skeletal bone mass. A large decline occurs in older adults which increases the risk of  osteoporosis. In women this occurs around the time of menopause. It is important for young girls to reach their peak bone mass in order to maintain bone health throughout life. A person with high bone mass as a young adult will be more likely to have a higher bone mass later in life. Not enough calcium consumption and physical activity early on could result in a failure to achieve optimum bone mass in adulthood. OSTEOPOROSIS Osteoporosis is a disease of the bones. It is defined as low bone mass with deterioration of bone structure. Osteoporosis leads to an increase risk of fractures with falls. These fractures commonly happen in the wrist, hip, and spine. While men and women of all ages and background can  develop osteoporosis, some of the risk factors for osteoporosis are:  Female.  White.  Postmenopausal.  Older adults.  Small in body size.  Eating a diet low in calcium.  Physically inactive.  Smoking.  Use of some medications.  Family history. CALCIUM Calcium is a mineral needed by the body for healthy bones, teeth, and proper function of the heart, muscles, and nerves. The body cannot produce calcium so it must be absorbed through food. Good sources of calcium include:  Dairy products (low fat or nonfat milk, cheese, and yogurt).  Dark green leafy vegetables (bok choy and broccoli).  Calcium fortified foods (orange juice, cereal, bread, soy beverages, and tofu products).  Nuts (almonds). Recommended amounts of calcium vary for individuals. RECOMMENDED CALCIUM INTAKES Age and Amount in mg per day  Children 1 to 3 years / 700 mg  Children 4 to 8 years / 1,000 mg  Children 9 to 13 years / 1,300 mg  Teens 14 to 18 years / 1,300 mg  Adults 19 to 50 years / 1,000 mg  Adult women 51 to 70 years / 1,200 mg  Adults 71 years and older / 1,200 mg  Pregnant and breastfeeding teens / 1,300 mg  Pregnant and breastfeeding adults / 1,000 mg Vitamin D also plays an important  role in healthy bone development. Vitamin D helps in the absorption of calcium. WEIGHT-BEARING PHYSICAL ACTIVITY Regular physical activity has many positive health benefits. Benefits include strong bones. Weight-bearing physical activity early in life is important in reaching peak bone mass. Weight-bearing physical activities cause muscles and bones to work against gravity. Some examples of weight bearing physical activities include:  Walking, jogging, or running.  [employer].  Jumping rope.  Dancing.  Soccer.  Tennis or Racquetball.  Stair climbing.  Basketball.  Hiking.  Weight lifting.  Aerobic fitness classes. Including weight-bearing physical activity into an exercise plan is a great way to keep bones healthy. Adults: Engage in at least 30 minutes of moderate physical activity on most, preferably all, days of the week. Children: Engage in at least 60 minutes of moderate physical activity on most, preferably all, days of the week. FOR MORE INFORMATION [place] Web designer, Soil scientist for [employer] and Promotion: www.cnpp.usda.[place]: EquipmentWeekly.com.ee Document Released: 05/15/2003 Document Revised: 06/19/2012 Document Reviewed: 08/14/2008 [facility] Patient Information 2014 [place], [place].

## 2013-03-20 LAB — URINALYSIS, ROUTINE W REFLEX MICROSCOPIC
BILIRUBIN URINE: NEGATIVE
Glucose, UA: NEGATIVE mg/dL
Hgb urine dipstick: NEGATIVE
Ketones, ur: NEGATIVE mg/dL
Leukocytes, UA: NEGATIVE
NITRITE: POSITIVE — AB
PROTEIN: NEGATIVE mg/dL
Specific Gravity, Urine: 1.017 (ref 1.005–1.030)
UROBILINOGEN UA: 0.2 mg/dL (ref 0.0–1.0)
pH: 7 (ref 5.0–8.0)

## 2013-03-20 LAB — URINALYSIS, MICROSCOPIC ONLY
CASTS: NONE SEEN
Crystals: NONE SEEN

## 2013-03-21 LAB — URINE CULTURE
COLONY COUNT: NO GROWTH
Organism ID, Bacteria: NO GROWTH

## 2013-08-07 ENCOUNTER — Telehealth: Payer: Self-pay | Admitting: Nurse Practitioner

## 2013-08-07 NOTE — Telephone Encounter (Signed)
Spoke with patient, she states that she has tingling in her arms and legs, can't hardly stand for something to touch her, not sleeping well, has a burning sensation, symptoms started 2-3 weeks ago but have become progressively worse, she is in a lot of pain and would like to be seen sooner than her appointment in December.

## 2013-08-07 NOTE — Telephone Encounter (Signed)
Patient is scheduled to come in this Friday 08-10-13.

## 2013-08-07 NOTE — Telephone Encounter (Signed)
Please schedule an appt in the next few weeks when Dr. Jannifer Franklin is in the office.

## 2013-08-07 NOTE — Telephone Encounter (Signed)
Patient called requested an earlier appontment than 12/14.  She states her body aches all over and can barely raise her arms.  Please call and advise.

## 2013-08-10 ENCOUNTER — Ambulatory Visit (INDEPENDENT_AMBULATORY_CARE_PROVIDER_SITE_OTHER): Payer: PRIVATE HEALTH INSURANCE | Admitting: Nurse Practitioner

## 2013-08-10 ENCOUNTER — Encounter: Payer: Self-pay | Admitting: Nurse Practitioner

## 2013-08-10 ENCOUNTER — Encounter (INDEPENDENT_AMBULATORY_CARE_PROVIDER_SITE_OTHER): Payer: Self-pay

## 2013-08-10 VITALS — BP 127/79 | HR 96 | Ht 66.0 in | Wt 251.0 lb

## 2013-08-10 DIAGNOSIS — M25569 Pain in unspecified knee: Secondary | ICD-10-CM | POA: Insufficient documentation

## 2013-08-10 DIAGNOSIS — G2581 Restless legs syndrome: Secondary | ICD-10-CM

## 2013-08-10 DIAGNOSIS — R209 Unspecified disturbances of skin sensation: Secondary | ICD-10-CM

## 2013-08-10 DIAGNOSIS — Z5181 Encounter for therapeutic drug level monitoring: Secondary | ICD-10-CM

## 2013-08-10 NOTE — Progress Notes (Signed)
I have read the note, and I agree with the clinical assessment and plan.  Luria Rosario K Andyn Sales   

## 2013-08-10 NOTE — Patient Instructions (Addendum)
EMG nerve conduction Will check labs today Continue current meds does not need refills F/U in 6 months

## 2013-08-10 NOTE — Progress Notes (Signed)
[facility]  PATIENT: MELONEY FELD DOB: [birth year]   REASON FOR VISIT: Followup for restless legs, pain in lower extremities   HISTORY OF PRESENT ILLNESS: Ms. Kruser, 58 year old female returns for followup. She was last seen in this office 02/16/2013. She called into the office last week complaining increased tingling in her arms and legs, not sleeping well have a burning sensation in her lower extremities,  her symptoms lasting for 2-3 weeks and becoming progressively worse. She complains of bilateral leg pain. The patient has a previous history of chronic low back pain. Restless leg syndrome  is currently well controlled on baclofen, gabapentin, carbamazepine and Mirapex.  She has not fallen, but does complain of walking difficulty. She also has some shoulder pain. She has a history of carpal tunnel syndrome . She returns for reevaluation  HISTORY: Restless legs syndrome . The patient has recently had knee surgery in the right, and she is still recovering from this. The patient is on carbamazepine, baclofen, and gabapentin as well as Mirapex. The patient is doing well currently, and she is sleeping well at nighttime. The patient not having much pain with ambulation at this time. The patient returns to this office for an evaluation. The patient denies any gait instability. The patient indicates that she just had blood work done through her primary care physician, and she will have this blood work faxed to this office.   REVIEW OF SYSTEMS: Full 14 system review of systems performed and notable only for those listed, all others are neg:  Constitutional: Fatigue Cardiovascular: Leg swelling Ear/Nose/Throat: N/A  Skin: N/A  Eyes: N/A  Respiratory: N/A  Gastroitestinal: N/A  Hematology/Lymphatic: N/A  Endocrine: Excessive thirst Musculoskeletal: Joint pain, walking difficulty, achy muscles Allergy/Immunology: N/A  Neurological: Weakness ,  Psychiatric: Agitation,  depression anxiety, bipolar Sleep : Restless legs, insomnia   ALLERGIES: Allergies  Allergen Reactions  . Lisinopril Cough    HOME MEDICATIONS: Outpatient Prescriptions Prior to Visit  Medication Sig Dispense Refill  . AFLURIA PRESERVATIVE FREE injection       . amitriptyline (ELAVIL) 25 MG tablet Take 25 mg by mouth at bedtime.       . baclofen (LIORESAL) 10 MG tablet Take 1 tablet (10 mg total) by mouth 3 (three) times daily.  90 each  6  . carbamazepine (TEGRETOL) 200 MG tablet Take 1.5 tablets (300 mg total) by mouth 2 (two) times daily.  90 tablet  11  . DULoxetine (CYMBALTA) 60 MG capsule Take 60 mg by mouth 2 (two) times daily.       Marland Kitchen estradiol (ESTRACE) 1 MG tablet Take 1 tablet (1 mg total) by mouth daily.  30 tablet  5  . fenofibrate (TRICOR) 145 MG tablet Take 145 mg by mouth daily.       . fluticasone (FLONASE) 50 MCG/ACT nasal spray Place 2 sprays into the nose daily.       Marland Kitchen gabapentin (NEURONTIN) 800 MG tablet Take 1 tablet (800 mg total) by mouth 3 (three) times daily.  90 tablet  11  . losartan (COZAAR) 25 MG tablet Take 25 mg by mouth daily.       . meloxicam (MOBIC) 15 MG tablet Take 15 mg by mouth daily.       Marland Kitchen omeprazole (PRILOSEC) 20 MG capsule Take 20 mg by mouth daily.       . pramipexole (MIRAPEX) 0.25 MG tablet take 3 tablets by mouth every evening  90 tablet  11  .  pramipexole (MIRAPEX) 0.75 MG tablet take 1 tablet by mouth EVERY NIGHT  30 tablet  4   No facility-administered medications prior to visit.    PAST MEDICAL HISTORY: Past Medical History  Diagnosis Date  . Hypertension   . Depression   . Bipolar disorder   . Arthritis   . Hyperlipidemia   . GERD (gastroesophageal reflux disease)   . Gout   . Fibroids   . chronic low back pain   . Obesity     PAST SURGICAL HISTORY: Past Surgical History  Procedure Laterality Date  . Spine surgery    . Knee arthroplasty    . Cholecystectomy    . Ovarian cyst removal    . Abdominal hysterectomy       FAMILY HISTORY: Family History  Problem Relation Age of Onset  . Diabetes Mother   . Cancer Mother     breast  . Heart disease Mother   . Mental illness Mother     bipolar disorder  . Diabetes Father     SOCIAL HISTORY: History   Social History  . Marital Status: Divorced    Spouse Name: N/A    Number of Children: 0  . Years of Education: 10   Occupational History  .     Social History Main Topics  . Smoking status: Never Smoker   . Smokeless tobacco: Never Used  . Alcohol Use: No     Comment: Ocassionally   . Drug Use: No  . Sexual Activity: Not Currently   Other Topics Concern  . Not on file   Social History Narrative   Patient is divorced and lives with a roommate.   Patient is on disability.   Patient has a 10th grade education.   Patient is right-handed.   Patient drinks a 2 liter of soda daily.              PHYSICAL EXAM  Filed Vitals:   08/10/13 1434  BP: 127/79  Pulse: 96  Height: 5\' 6"  (1.676 m)  Weight: 251 lb (113.853 kg)   Body mass index is 40.53 kg/(m^2).  Generalized: Well developed, obese female in no acute distress  Head: normocephalic and atraumatic,. Oropharynx benign  Neck: Supple, no carotid bruits  Cardiac: Regular rate rhythm, no murmur  Musculoskeletal: No deformity  Skin thick calluses both heels and plantar surfaces  Neurological examination   Mentation: Alert oriented to time, place, history taking. Follows all commands speech and language fluent  Cranial nerve II-XII: Pupils were equal round reactive to light extraocular movements were full, visual field were full on confrontational test. Facial sensation and strength were normal. hearing was intact to finger rubbing bilaterally. Uvula tongue midline. head turning and shoulder shrug were normal and symmetric.Tongue protrusion into cheek strength was normal. Motor: normal bulk and tone, full strength in the BUE, BLE, No focal weakness, decreased range of motion  to left shoulder Sensory: Decreased vibratory and pinprick to the  foot on the right, left normal   Coordination: finger-nose-finger, heel-to-shin bilaterally, no dysmetria Reflexes: Depressed and symmetric upper and lower, plantar responses were flexor bilaterally. Gait and Station: Rising up from seated position without assistance, limping on the right side  moderate stride,  able to perform tiptoe, and heel walking without difficulty. Tandem gait is unsteady. No assistive device  DIAGNOSTIC DATA (LABS, IMAGING, TESTING) -  ASSESSMENT AND PLAN  58 y.o. year old female  has a past medical history of  Depression; Bipolar disorder; Arthritis;  chronic low back pain; restless legs and Obesity. here to followup. Burning sensation in both lower legs with pain and difficulty with ambulation for several weeks. .  EMG nerve conduction both lower extremities Will check labs today, CBC, CMP, CBZ level Continue current meds does not need refills F/U in 6 months Dennie Bible, [facility], [facility], APRN  [facility] [address], [place], [place] [address] [phone]

## 2013-08-11 LAB — COMPREHENSIVE METABOLIC PANEL
ALT: 33 IU/L — ABNORMAL HIGH (ref 0–32)
AST: 27 IU/L (ref 0–40)
Albumin/Globulin Ratio: 2.2 (ref 1.1–2.5)
Albumin: 4.6 g/dL (ref 3.5–5.5)
Alkaline Phosphatase: 67 IU/L (ref 39–117)
BUN / CREAT RATIO: 21 (ref 9–23)
BUN: 14 mg/dL (ref 6–24)
CHLORIDE: 103 mmol/L (ref 97–108)
CO2: 24 mmol/L (ref 18–29)
Calcium: 9.8 mg/dL (ref 8.7–10.2)
Creatinine, Ser: 0.66 mg/dL (ref 0.57–1.00)
GFR calc Af Amer: 115 mL/min/{1.73_m2} (ref >59)
GFR, EST NON AFRICAN AMERICAN: 100 mL/min/{1.73_m2} (ref >59)
GLOBULIN, TOTAL: 2.1 g/dL (ref 1.5–4.5)
Glucose: 103 mg/dL — ABNORMAL HIGH (ref 65–99)
Potassium: 4 mmol/L (ref 3.5–5.2)
Sodium: 142 mmol/L (ref 134–144)
TOTAL PROTEIN: 6.7 g/dL (ref 6.0–8.5)

## 2013-08-11 LAB — CBC WITH DIFFERENTIAL/PLATELET
Basophils Absolute: 0 10*3/uL (ref 0.0–0.2)
Basos: 1 %
EOS: 4 %
Eosinophils Absolute: 0.3 10*3/uL (ref 0.0–0.4)
HCT: 37.3 % (ref 34.0–46.6)
Hemoglobin: 12.7 g/dL (ref 11.1–15.9)
IMMATURE GRANS (ABS): 0 10*3/uL (ref 0.0–0.1)
IMMATURE GRANULOCYTES: 0 %
LYMPHS ABS: 2.6 10*3/uL (ref 0.7–3.1)
Lymphs: 38 %
MCH: 28.9 pg (ref 26.6–33.0)
MCHC: 34 g/dL (ref 31.5–35.7)
MCV: 85 fL (ref 79–97)
MONOCYTES: 10 %
Monocytes Absolute: 0.7 10*3/uL (ref 0.1–0.9)
Neutrophils Absolute: 3.2 10*3/uL (ref 1.4–7.0)
Neutrophils Relative %: 47 %
RBC: 4.39 x10E6/uL (ref 3.77–5.28)
RDW: 14.5 % (ref 12.3–15.4)
WBC: 6.8 10*3/uL (ref 3.4–10.8)

## 2013-08-11 LAB — CARBAMAZEPINE LEVEL, TOTAL: CARBAMAZEPINE LVL: 5 ug/mL (ref 4.0–12.0)

## 2013-08-13 NOTE — Progress Notes (Signed)
Quick Note:  Called patient and shared lab results per Ms Martin's findings, verbalized understanding ______

## 2013-08-23 ENCOUNTER — Other Ambulatory Visit: Payer: Self-pay

## 2013-08-23 MED ORDER — BACLOFEN 10 MG PO TABS: 10.0000 mg | ORAL_TABLET | Freq: Three times a day (TID) | ORAL | Status: DC

## 2013-08-30 ENCOUNTER — Ambulatory Visit (INDEPENDENT_AMBULATORY_CARE_PROVIDER_SITE_OTHER): Payer: PRIVATE HEALTH INSURANCE | Admitting: Neurology

## 2013-08-30 ENCOUNTER — Encounter (INDEPENDENT_AMBULATORY_CARE_PROVIDER_SITE_OTHER): Payer: Self-pay

## 2013-08-30 DIAGNOSIS — G2581 Restless legs syndrome: Secondary | ICD-10-CM

## 2013-08-30 DIAGNOSIS — M25569 Pain in unspecified knee: Secondary | ICD-10-CM

## 2013-08-30 DIAGNOSIS — M79609 Pain in unspecified limb: Secondary | ICD-10-CM

## 2013-08-30 DIAGNOSIS — R209 Unspecified disturbances of skin sensation: Secondary | ICD-10-CM

## 2013-08-30 DIAGNOSIS — Z0289 Encounter for other administrative examinations: Secondary | ICD-10-CM

## 2013-08-30 NOTE — Procedures (Signed)
     HISTORY:  Rachel Luna is a 58 year old patient with a history of restless leg syndrome. She has a history of prior lumbosacral spine surgery at L5-S1 level with decompression of the right S1 nerve root. The patient reports increased discomfort in both legs, right greater than left with some burning sensations going up to the buttocks area on the right. The patient is being evaluated for these new symptoms.  NERVE CONDUCTION STUDIES:  Nerve conduction studies were performed on both lower extremities. The distal motor latencies and motor amplitudes for the peroneal and posterior tibial nerves were within normal limits. The nerve conduction velocities for these nerves were also normal. The H reflex latencies were normal. The sensory latencies for the peroneal nerves were within normal limits.   EMG STUDIES:  EMG study was performed on the right lower extremity:  The tibialis anterior muscle reveals 2 to 4K motor units with full recruitment. No fibrillations or positive waves were seen. The peroneus tertius muscle reveals 2 to 4K motor units with full recruitment. No fibrillations or positive waves were seen. The medial gastrocnemius muscle reveals 1 to 3K motor units with decreased recruitment. 2+ fibrillations and positive waves were seen. The vastus lateralis muscle reveals 2 to 4K motor units with full recruitment. No fibrillations or positive waves were seen. The iliopsoas muscle reveals 2 to 4K motor units with full recruitment. No fibrillations or positive waves were seen. The biceps femoris muscle (long head) reveals 2 to 4K motor units with full recruitment. No fibrillations or positive waves were seen. The lumbosacral paraspinal muscles were tested at 3 levels, and revealed no abnormalities of insertional activity at all 3 levels tested. There was good relaxation.   IMPRESSION:  Nerve conduction studies done on both lower extremities were within normal limits. No evidence of a  peripheral neuropathy is seen. A small fiber neuropathy may be missed by a standard nerve conduction studies, however. Clinical correlation is required. EMG evaluation of the right lower extremity shows isolated acute denervation of the medial gastrocnemius muscle. Given the history of this patient, an overlying S1 radiculopathy needs to be considered, but he cannot be confirmed on this evaluation as the lumbosacral paraspinal muscles were unaffected. Again, clinical correlation is required.  Jill Alexanders MD 08/30/2013 5:27 PM  [facility] [address] [place], [place] [address]  Phone [phone] Fax [phone]

## 2013-08-30 NOTE — Progress Notes (Signed)
Rachel Luna is a 58 year old patient with a history of restless leg syndrome. The patient has had prior decompressive surgery at L5-S1 level off of the right. The patient has had some recent worsening of discomfort in both legs, right greater left. The patient is being evaluated for a possible neuropathy or a lumbosacral radiculopathy.  Nerve conduction studies done today show no evidence of a peripheral neuropathy. EMG evaluation of the right leg shows isolated denervation of the medial gastrocnemius muscle, clinical correlation is unclear , but this may represent a low-grade S1 radiculopathy.  The patient will be sent back for MRI evaluation of the lumbosacral spine.  We will contact patient once the results are available.

## 2013-09-10 ENCOUNTER — Ambulatory Visit
Admission: RE | Admit: 2013-09-10 | Discharge: 2013-09-10 | Disposition: A | Discharge status: Home / Self Care | Payer: PRIVATE HEALTH INSURANCE | Source: Ambulatory Visit | Attending: Neurology | Admitting: Neurology

## 2013-09-10 DIAGNOSIS — G2581 Restless legs syndrome: Secondary | ICD-10-CM

## 2013-09-10 MED ORDER — GADOBENATE DIMEGLUMINE 529 MG/ML IV SOLN
20.0000 mL | Freq: Once | INTRAVENOUS | Status: AC | PRN
  Administered 2013-09-10: 20 mL via INTRAVENOUS

## 2013-09-11 ENCOUNTER — Telehealth: Payer: Self-pay | Admitting: Neurology

## 2013-09-11 NOTE — Telephone Encounter (Signed)
I called patient. MRI the low back shows prior surgery at L5-S1 level on the right, no significant spinal stenosis or neuroforaminal stenosis. Nothing that should cause pain in the feet and legs. The patient could potentially have a small fiber neuropathy. Nerve conduction study and EMG study did not show significant neuropathy issues. The patient will need medical therapy for pain.    MRI lumbar 09/11/2013:  Impression   Abnormal MRI lumbar spine (with and without) demonstrating: 1. At L5-S1: disc bulging and facet hypertrophy with right laminectomy  with mild biforaminal stenosis and mild spinal stenosis  2. At L4-5: disc bulging and facet hypertrophy with left lateral recess  stenosis and mild left foraminal stenosis

## 2013-09-19 ENCOUNTER — Telehealth: Payer: Self-pay | Admitting: *Deleted

## 2013-09-20 ENCOUNTER — Other Ambulatory Visit: Payer: Self-pay

## 2013-09-20 DIAGNOSIS — Z1231 Encounter for screening mammogram for malignant neoplasm of breast: Secondary | ICD-10-CM

## 2013-09-20 NOTE — Telephone Encounter (Signed)
Left VM message for patient concerning pain that she is having, how long has she had it and the location of her pain,what medicines is she currently taking for the pain

## 2013-09-20 NOTE — Telephone Encounter (Signed)
Spoke with patient and she is having RLS,both legs and arms, will not go away even with the Pramipexole 25mg  tablets that she is taking. The pain is worse when sitting, can't stand for even her cat to touch her.

## 2013-09-21 MED ORDER — GABAPENTIN 800 MG PO TABS: 800.0000 mg | ORAL_TABLET | Freq: Four times a day (QID) | ORAL | Status: DC

## 2013-09-21 NOTE — Telephone Encounter (Signed)
Called patient, she will increase Gabapentin to 800mg  QID, for the extra added dose she will take 400mg  for 4 days then increase to 800mg  four times daily. RX called in She is also on Elavil, baclofen, tegretol, cymbalta, and Mirapex. She is agreeable to above.

## 2013-09-26 ENCOUNTER — Ambulatory Visit: Payer: PRIVATE HEALTH INSURANCE

## 2013-09-27 ENCOUNTER — Ambulatory Visit
Admission: RE | Admit: 2013-09-27 | Discharge: 2013-09-27 | Disposition: A | Discharge status: Home / Self Care | Payer: PRIVATE HEALTH INSURANCE | Source: Ambulatory Visit

## 2013-09-27 DIAGNOSIS — Z1231 Encounter for screening mammogram for malignant neoplasm of breast: Secondary | ICD-10-CM

## 2013-11-06 ENCOUNTER — Telehealth: Payer: Self-pay | Admitting: *Deleted

## 2013-11-06 NOTE — Telephone Encounter (Signed)
Fax from pharmacy for refill on pt Estradiol 1mg  tablets.  Date written 07-18-13 D30 R3 was given.   Please advise.

## 2013-11-07 ENCOUNTER — Other Ambulatory Visit: Payer: Self-pay | Admitting: *Deleted

## 2013-11-07 MED ORDER — ESTRADIOL 1 MG PO TABS: 1.0000 mg | ORAL_TABLET | Freq: Every day | ORAL | Status: DC

## 2013-11-07 NOTE — Progress Notes (Signed)
Refill sent.

## 2013-11-07 NOTE — Telephone Encounter (Signed)
Refill sent to pharmacy.   

## 2013-11-07 NOTE — Telephone Encounter (Signed)
OK to refill

## 2014-02-18 ENCOUNTER — Ambulatory Visit (INDEPENDENT_AMBULATORY_CARE_PROVIDER_SITE_OTHER): Payer: PRIVATE HEALTH INSURANCE | Admitting: Neurology

## 2014-02-18 ENCOUNTER — Encounter: Payer: Self-pay | Admitting: Neurology

## 2014-02-18 VITALS — BP 149/88 | HR 87 | Ht 64.0 in | Wt 269.0 lb

## 2014-02-18 DIAGNOSIS — R209 Unspecified disturbances of skin sensation: Secondary | ICD-10-CM

## 2014-02-18 DIAGNOSIS — M545 Low back pain, unspecified: Secondary | ICD-10-CM

## 2014-02-18 DIAGNOSIS — G2581 Restless legs syndrome: Secondary | ICD-10-CM

## 2014-02-18 DIAGNOSIS — G8929 Other chronic pain: Secondary | ICD-10-CM

## 2014-02-18 HISTORY — DX: Low back pain, unspecified: M54.50

## 2014-02-18 MED ORDER — ROTIGOTINE 6 MG/24HR TD PT24: 1.0000 | MEDICATED_PATCH | Freq: Every day | TRANSDERMAL | Status: DC

## 2014-02-18 NOTE — Progress Notes (Signed)
Reason for visit: Restless leg syndrome  Rachel Luna is an 58 y.o. female  History of present illness:  Rachel Luna is a 58 year old white female with a history of obesity who reports symptoms of restless leg. The patient has had prior lumbosacral spine surgery, and she reports numbness down the right leg. The patient has had a recent MRI of the low back done in July 2015 that does show some disc bulges without significant spinal stenosis or evidence of nerve root impingement on the right. The patient indicates that she has a sensation of restless legs while walking, but this is also present when resting or trying to sleep at night. She reports some cramping in the muscles in the right leg primarily, and she does take baclofen for this. The patient is on Mirapex taking 0.75 mg at night, but she will have troubles throughout the day as well. She takes gabapentin throughout the day. She returns to this office for further evaluation. She is on carbamazepine, and she has had blood work and a carbamazepine level done in June 2015. She returns for an evaluation. She reports a painful cyst on the dorsum of the left foot.  Past Medical History  Diagnosis Date  . Hypertension   . Depression   . Bipolar disorder   . Arthritis   . Hyperlipidemia   . GERD (gastroesophageal reflux disease)   . Gout   . Fibroids   . chronic low back pain   . Obesity   . Chronic low back pain 02/18/2014    Past Surgical History  Procedure Laterality Date  . Spine surgery    . Knee arthroplasty    . Cholecystectomy    . Ovarian cyst removal    . Abdominal hysterectomy      Family History  Problem Relation Age of Onset  . Diabetes Mother   . Cancer Mother     breast  . Heart disease Mother   . Mental illness Mother     bipolar disorder  . Diabetes Father     Social history:  reports that she has never smoked. She has never used smokeless tobacco. She reports that she does not drink alcohol or use  illicit drugs.    Allergies  Allergen Reactions  . Lisinopril Cough    Medications:  Current Outpatient Prescriptions on File Prior to Visit  Medication Sig Dispense Refill  . AFLURIA PRESERVATIVE FREE injection     . amitriptyline (ELAVIL) 25 MG tablet Take 25 mg by mouth at bedtime.     . baclofen (LIORESAL) 10 MG tablet Take 1 tablet (10 mg total) by mouth 3 (three) times daily. 90 each 6  . carbamazepine (TEGRETOL) 200 MG tablet Take 1.5 tablets (300 mg total) by mouth 2 (two) times daily. 90 tablet 11  . Cholecalciferol (VITAMIN D3) 2000 UNITS TABS Take 2,000 mg by mouth daily.    . DULoxetine (CYMBALTA) 60 MG capsule Take 60 mg by mouth 2 (two) times daily.     Marland Kitchen estradiol (ESTRACE) 1 MG tablet Take 1 tablet (1 mg total) by mouth daily. 30 tablet 5  . fenofibrate (TRICOR) 145 MG tablet Take 145 mg by mouth daily.     . fluticasone (FLONASE) 50 MCG/ACT nasal spray Place 2 sprays into the nose daily.     Marland Kitchen gabapentin (NEURONTIN) 800 MG tablet Take 1 tablet (800 mg total) by mouth 4 (four) times daily. 120 tablet 6  . losartan (COZAAR) 25 MG tablet  Take 25 mg by mouth daily.     . meloxicam (MOBIC) 15 MG tablet Take 15 mg by mouth daily.     Marland Kitchen omeprazole (PRILOSEC) 20 MG capsule Take 20 mg by mouth daily.      No current facility-administered medications on file prior to visit.    ROS:  Out of a complete 14 system review of symptoms, the patient complains only of the following symptoms, and all other reviewed systems are negative.  Eye itching, eye redness Leg swelling, heart murmur Heat intolerance, excessive thirst Restless legs, insomnia, frequent waking, snoring  Blood pressure 149/88, pulse 87, height 5\' 4"  (1.626 m), weight 269 lb (122.018 kg).  Physical Exam  General: The patient is alert and cooperative at the time of the examination. The patient is markedly obese.  Skin: No significant peripheral edema is noted. A small cyst, 1.52 cm is noted on the dorsum of  the left foot.   Neurologic Exam  Mental status: The patient is oriented x 3.  Cranial nerves: Facial symmetry is present. Speech is normal, no aphasia or dysarthria is noted. Extraocular movements are full. Visual fields are full.  Motor: The patient has good strength in all 4 extremities.  Sensory examination: Soft touch sensation is decreased on the right leg compared to the left, symmetric on the arms and the face.  Coordination: The patient has good finger-nose-finger and heel-to-shin bilaterally.  Gait and station: The patient has a normal gait. Tandem gait is slightly unsteady. Romberg is negative. No drift is seen.  Reflexes: Deep tendon reflexes are symmetric, but are depressed.   MRI lumbar 09/11/13:  IMPRESSION:  Abnormal MRI lumbar spine (with and without) demonstrating: 1. At L5-S1: disc bulging and facet hypertrophy with right laminectomy with mild biforaminal stenosis and mild spinal stenosis  2. At L4-5: disc bulging and facet hypertrophy with left lateral recess stenosis and mild left foraminal stenosis    Assessment/Plan:  1. Chronic low back pain, right leg numbness  2. Obesity  3. Restless leg syndrome  4. 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 In the new year, the patient wishes to consider an epidural steroid injection of the back, she may require a podiatry referral for the painful cyst on the left foot. The patient will follow-up through this office otherwise in about 6 months. The patient will be sent for blood work to check a ferritin level.  Jill Alexanders MD 02/18/2014 8:49 PM  [facility] [address] [place], [place] [address]  Phone [phone] Fax [phone]

## 2014-02-18 NOTE — Patient Instructions (Signed)
Restless Legs Syndrome Restless legs syndrome is a movement disorder. It may also be called a sensorimotor disorder.  CAUSES  No one knows what specifically causes restless legs syndrome, but it tends to run in families. It is also more common in people with low iron, in pregnancy, in people who need dialysis, and those with nerve damage (neuropathy).Some medications may make restless legs syndrome worse.Those medications include drugs to treat high blood pressure, some heart conditions, nausea, colds, allergies, and depression. SYMPTOMS Symptoms include uncomfortable sensations in the legs. These leg sensations are worse during periods of inactivity or rest. They are also worse while sitting or lying down. Individuals that have the disorder describe sensations in the legs that feel like:  Pulling.  Drawing.  Crawling.  Worming.  Boring.  Tingling.  Pins and needles.  Prickling.  Pain. The sensations are usually accompanied by an overwhelming urge to move the legs. Sudden muscle jerks may also occur. Movement provides temporary relief from the discomfort. In rare cases, the arms may also be affected. Symptoms may interfere with going to sleep (sleep onset insomnia). Restless legs syndrome may also be related to periodic limb movement disorder (PLMD). PLMD is another more common motor disorder. It also causes interrupted sleep. The symptoms from PLMD usually occur most often when you are awake. TREATMENT  Treatment for restless legs syndrome is symptomatic. This means that the symptoms are treated.   Massage and cold compresses may provide temporary relief.  Walk, stretch, or take a cold or hot bath.  Get regular exercise and a good night's sleep.  Avoid caffeine, alcohol, nicotine, and medications that can make it worse.  Do activities that provide mental stimulation like discussions, needlework, and video games. These may be helpful if you are not able to walk or stretch. Some  medications are effective in relieving the symptoms. However, many of these medications have side effects. Ask your caregiver about medications that may help your symptoms. Correcting iron deficiency may improve symptoms for some patients. Document Released: 02/12/2002 Document Revised: 07/09/2013 Document Reviewed: 05/21/2010 Exit[employer] Patient Information 2015 [employer]. This information is not intended to replace advice given to you by your health care provider. Make sure you discuss any questions you have with your health care provider.  

## 2014-02-19 ENCOUNTER — Telehealth: Payer: Self-pay | Admitting: Neurology

## 2014-02-19 LAB — FERRITIN: Ferritin: 45 ng/mL (ref 15–150)

## 2014-02-19 NOTE — Telephone Encounter (Signed)
I called the patient. The ferritin level is 45, on the low end of normal. I have recommended that the patient go on iron supplementation. The patient indicates that the Neupro patch requires preauthorization.

## 2014-02-19 NOTE — Telephone Encounter (Signed)
I contacted ins and provided all clinical info.  We were able to get a prior auth approval on Neupro valid until 03/08/2015. They will send approval letter to the patient as well as our office.

## 2014-02-24 ENCOUNTER — Other Ambulatory Visit: Payer: Self-pay | Admitting: Nurse Practitioner

## 2014-02-26 ENCOUNTER — Other Ambulatory Visit: Payer: Self-pay

## 2014-02-26 MED ORDER — BACLOFEN 10 MG PO TABS
10.0000 mg | ORAL_TABLET | Freq: Three times a day (TID) | ORAL | Status: DC
Start: 2014-02-26 — End: 2014-08-26

## 2014-03-04 ENCOUNTER — Encounter: Payer: Self-pay | Admitting: *Deleted

## 2014-03-12 ENCOUNTER — Telehealth: Payer: Self-pay | Admitting: Neurology

## 2014-03-12 DIAGNOSIS — M79672 Pain in left foot: Secondary | ICD-10-CM

## 2014-03-12 DIAGNOSIS — M545 Low back pain, unspecified: Secondary | ICD-10-CM

## 2014-03-12 NOTE — Telephone Encounter (Signed)
Patient stated she would like to proceed with back injections and also referral to foot specialist.  Please call and advise.

## 2014-03-12 NOTE — Telephone Encounter (Signed)
I called the patient. I will get her setup for an epidural steroid injection of the low back, and set her up to see Dr. Paulla Dolly for the foot cyst.

## 2014-03-16 ENCOUNTER — Other Ambulatory Visit: Payer: Self-pay | Admitting: Neurology

## 2014-03-18 ENCOUNTER — Ambulatory Visit: Payer: 59 | Admitting: Obstetrics & Gynecology

## 2014-03-25 ENCOUNTER — Ambulatory Visit: Payer: Self-pay | Admitting: Psychiatry

## 2014-03-25 ENCOUNTER — Telehealth: Payer: Self-pay | Admitting: Neurology

## 2014-03-25 DIAGNOSIS — M545 Low back pain: Principal | ICD-10-CM

## 2014-03-25 DIAGNOSIS — G8929 Other chronic pain: Secondary | ICD-10-CM

## 2014-03-25 LAB — LIPID PANEL
CHOLESTEROL: 220 mg/dL — AB (ref 0–200)
HDL Cholesterol: 75 mg/dL — ABNORMAL HIGH (ref 40–60)
Ldl Cholesterol, Calc: 104 mg/dL — ABNORMAL HIGH (ref 0–100)
Triglycerides: 204 mg/dL — ABNORMAL HIGH (ref 0–200)
VLDL CHOLESTEROL, CALC: 41 mg/dL — AB (ref 5–40)

## 2014-03-25 LAB — GLUCOSE, RANDOM: Glucose: 126 mg/dL — ABNORMAL HIGH (ref 65–99)

## 2014-03-25 MED ORDER — PRAMIPEXOLE DIHYDROCHLORIDE 0.25 MG PO TABS: ORAL_TABLET | ORAL | Status: DC

## 2014-03-25 NOTE — Telephone Encounter (Signed)
I called the patient. She is allergic to the adhesive on the patch. She will stop the Neupro. I will switch her back to Mirapex taking 0.25 mg during the day, and 0.75 mg in the evening. The patient has not heard anything yet about the epidural steroid injection of the low back, I will reorder this.

## 2014-03-25 NOTE — Telephone Encounter (Signed)
I called back.  Patient says she has been using the Neupro patch, and for the past 10 days or so she has started breaking out with itchy rashes where the patch has been applied.  (This reaction did not occur when she originally started the med)  Says she has stopped using the patches and would like to know if she can be changed back to Mirapex, or if something else is recommended.  Please advise.  Thank you.

## 2014-03-25 NOTE — Telephone Encounter (Signed)
Pt is calling to state that rotigotine (NEUPRO) 6 MG/24HR is making her break out and itch. She has quit using them and needs something else.  Please call and advise.

## 2014-04-01 ENCOUNTER — Ambulatory Visit (INDEPENDENT_AMBULATORY_CARE_PROVIDER_SITE_OTHER): Payer: Medicare Other | Admitting: Podiatry

## 2014-04-01 ENCOUNTER — Encounter: Payer: Self-pay | Admitting: Podiatry

## 2014-04-01 ENCOUNTER — Ambulatory Visit (INDEPENDENT_AMBULATORY_CARE_PROVIDER_SITE_OTHER): Payer: Medicare Other

## 2014-04-01 VITALS — BP 158/88 | HR 83 | Resp 16

## 2014-04-01 DIAGNOSIS — M779 Enthesopathy, unspecified: Secondary | ICD-10-CM

## 2014-04-01 DIAGNOSIS — M898X9 Other specified disorders of bone, unspecified site: Secondary | ICD-10-CM

## 2014-04-01 MED ORDER — TRIAMCINOLONE ACETONIDE 10 MG/ML IJ SUSP
10.0000 mg | Freq: Once | INTRAMUSCULAR | Status: AC
  Administered 2014-04-01: 10 mg

## 2014-04-01 NOTE — Progress Notes (Signed)
   Subjective:    Patient ID: Rachel Luna, female    DOB: 09/26/1955, 59 y.o.   MRN: [id number]  HPI Comments: "I have a knot on the top of the left"  Patient c/o aching dorsal midfoot left for several months. Just recently gotten worse. There is a large knot and has gotten bigger. Painful with shoes and a lot of walking. No treatment.  Foot Pain Associated symptoms include arthralgias.      Review of Systems  Musculoskeletal: Positive for back pain, arthralgias and gait problem.  All other systems reviewed and are negative.      Objective:   Physical Exam        Assessment & Plan:

## 2014-04-03 NOTE — Progress Notes (Signed)
Subjective:     Patient ID: Rachel Luna, female   DOB: 06-14-1955, 59 y.o.   MRN: [id number]  HPI patient states I'm having pain on top of my left foot for several months which has gotten worse recently and is making shoe gear difficult to wear. Do not remember specific injury and have not treated this   Review of Systems  All other systems reviewed and are negative.      Objective:   Physical Exam  Constitutional: She is oriented to person, place, and time.  Cardiovascular: Intact distal pulses.   Musculoskeletal: Normal range of motion.  Neurological: She is oriented to person, place, and time.  Skin: Skin is warm.  Nursing note and vitals reviewed.  neurovascular status intact with muscle strength adequate and range of motion subtalar midtarsal joint within normal limits. Patient is noted to have good digital perfusion is well oriented 3 and is noted to have discomfort on the dorsum of the left foot around the midtarsal joint with fluid buildup noted and pain with palpation     Assessment:     Probable inflammatory tendinitis with probable underlying arthritis bone spur formation    Plan:     H&P and x-rays reviewed and today I did an injection of the dorsal inflamed area and into the extensor complex 3 mg Kenalog 5 mg Xylocaine and advised on heat and ice therapy and shoe gear modification. If symptoms persist or pain continues patient will reappoint

## 2014-04-08 ENCOUNTER — Other Ambulatory Visit: Payer: Self-pay | Admitting: Neurology

## 2014-04-08 DIAGNOSIS — M545 Low back pain, unspecified: Secondary | ICD-10-CM

## 2014-04-08 DIAGNOSIS — G8929 Other chronic pain: Secondary | ICD-10-CM

## 2014-04-15 ENCOUNTER — Other Ambulatory Visit: Payer: Self-pay | Admitting: Neurology

## 2014-04-16 ENCOUNTER — Other Ambulatory Visit: Payer: Self-pay | Admitting: *Deleted

## 2014-04-16 DIAGNOSIS — N951 Menopausal and female climacteric states: Secondary | ICD-10-CM

## 2014-04-16 MED ORDER — ESTRADIOL 1 MG PO TABS: 1.0000 mg | ORAL_TABLET | Freq: Every day | ORAL | Status: DC

## 2014-04-19 ENCOUNTER — Other Ambulatory Visit: Payer: Medicaid Other

## 2014-04-23 ENCOUNTER — Ambulatory Visit: Payer: Medicare Other | Admitting: Certified Nurse Midwife

## 2014-04-23 ENCOUNTER — Encounter: Payer: Self-pay | Admitting: Certified Nurse Midwife

## 2014-04-23 ENCOUNTER — Ambulatory Visit (INDEPENDENT_AMBULATORY_CARE_PROVIDER_SITE_OTHER): Payer: Medicare Other | Admitting: Certified Nurse Midwife

## 2014-04-23 DIAGNOSIS — Z01419 Encounter for gynecological examination (general) (routine) without abnormal findings: Secondary | ICD-10-CM

## 2014-04-23 NOTE — Addendum Note (Signed)
Addended by: Lewie Loron D on: 04/23/2014 03:19 PM   Modules accepted: Orders

## 2014-04-23 NOTE — Addendum Note (Signed)
Addended by: Lewie Loron D on: 04/23/2014 02:44 PM   Modules accepted: Orders

## 2014-04-23 NOTE — Progress Notes (Signed)
Patient ID: Rachel Luna, female   DOB: 1956/03/07, 59 y.o.   MRN: [id number]   Rachel Luna is an 59 y.o. female. Here for annual exam.  Pertinent Gynecological History: Menses: post-menopausal Bleeding: post-hysterectomy, denies any vaginal bleeding.  Blood transfusions: none Sexually transmitted diseases: no past history Previous GYN Procedures: hysterectomy  Last mammogram: normal Date: 09/28/2013 Last pap: normal Date: 09/2009  OB History: G0, P0   Menstrual History: Menarche age: 76  No LMP recorded. Patient has had a hysterectomy.    Past Medical History  Diagnosis Date  . Hypertension   . Depression   . Bipolar disorder   . Arthritis   . Hyperlipidemia   . GERD (gastroesophageal reflux disease)   . Gout   . Fibroids   . chronic low back pain   . Obesity   . Chronic low back pain 02/18/2014    Past Surgical History  Procedure Laterality Date  . Spine surgery    . Knee arthroplasty    . Cholecystectomy    . Ovarian cyst removal    . Abdominal hysterectomy      Family History  Problem Relation Age of Onset  . Diabetes Mother   . Cancer Mother     breast  . Heart disease Mother   . Mental illness Mother     bipolar disorder  . Diabetes Father     Social History:  reports that she has never smoked. She has never used smokeless tobacco. She reports that she does not drink alcohol or use illicit drugs.  Allergies:  Allergies  Allergen Reactions  . Lisinopril Cough     (Not in a hospital admission)  REVIEW OF SYSTEMS: A ROS was performed and pertinent positives and negatives are included in the history.  GENERAL: No fevers or chills. HEENT: No change in vision, no earache, sore throat or sinus congestion. NECK: No pain or stiffness. CARDIOVASCULAR: No chest pain or pressure. No palpitations. PULMONARY: No shortness of breath, cough or wheeze. GASTROINTESTINAL: No abdominal pain, nausea, vomiting or diarrhea, melena or bright red blood per rectum.  GENITOURINARY: No urinary frequency, urgency, hesitancy or dysuria. MUSCULOSKELETAL: arthritis, no recent trauma. DERMATOLOGIC: No rash, no itching, skin tag right breast. ENDOCRINE: No polyuria, polydipsia, no heat or cold intolerance. No recent change in weight. HEMATOLOGICAL: No anemia or easy bruising or bleeding. NEUROLOGIC: No headache, seizures, numbness, tingling or weakness. PSYCHIATRIC: has been diagnosed and treated for bipolar, no current loss of interest in normal activity or change in sleep pattern.     There were no vitals taken for this visit.  Physical Exam:  HEENT:unremarkable Neck:Supple, midline, no thyroid megaly, no carotid bruits Lungs:  Clear to auscultation no rhonchi's or wheezes Heart:Regular rate and rhythm, no murmurs or gallops Breast Exam: no skin thickning  nipple retraction, or masses bilaterally.  Skin tag on lateral side of R. Breast <1cm round.   VULVA: normal appearing vulva with no masses, tenderness or lesions,  VAGINA: normal appearing vagina with normal color and discharge, no lesions,  UTERUS: surgically absent,  ADNEXA: normal adnexa in size, nontender and no masses Abdomen: obtunded, soft no palpable organomegaly.     No results found for this or any previous visit (from the past 24 hour(s)).  No results found.  Assessment: Well woman exam  Plan:   Nutrition Referral  F/U: 1 year for annual exam Next Mammogram in late July 2017.    Kandis Cocking A 04/23/2014, 12:57 PM

## 2014-04-24 LAB — PAP IG (IMAGE GUIDED)

## 2014-05-07 ENCOUNTER — Ambulatory Visit
Admission: RE | Admit: 2014-05-07 | Discharge: 2014-05-07 | Disposition: A | Discharge status: Home / Self Care | Payer: PRIVATE HEALTH INSURANCE | Source: Ambulatory Visit | Attending: Neurology | Admitting: Neurology

## 2014-05-07 DIAGNOSIS — M545 Low back pain, unspecified: Secondary | ICD-10-CM

## 2014-05-07 DIAGNOSIS — G8929 Other chronic pain: Secondary | ICD-10-CM

## 2014-05-07 MED ORDER — IOHEXOL 180 MG/ML  SOLN
1.0000 mL | Freq: Once | INTRAMUSCULAR | Status: AC | PRN
  Administered 2014-05-07: 1 mL via EPIDURAL

## 2014-05-07 MED ORDER — METHYLPREDNISOLONE ACETATE 40 MG/ML INJ SUSP (RADIOLOG
120.0000 mg | Freq: Once | INTRAMUSCULAR | Status: AC
  Administered 2014-05-07: 120 mg via EPIDURAL

## 2014-05-07 NOTE — Discharge Instructions (Signed)

## 2014-05-23 ENCOUNTER — Encounter: Payer: Self-pay | Admitting: Podiatry

## 2014-05-23 ENCOUNTER — Ambulatory Visit (INDEPENDENT_AMBULATORY_CARE_PROVIDER_SITE_OTHER): Payer: Medicare Other | Admitting: Podiatry

## 2014-05-23 VITALS — BP 127/76 | HR 88

## 2014-05-23 DIAGNOSIS — M779 Enthesopathy, unspecified: Secondary | ICD-10-CM

## 2014-05-23 DIAGNOSIS — M898X9 Other specified disorders of bone, unspecified site: Secondary | ICD-10-CM

## 2014-05-23 NOTE — Patient Instructions (Signed)
Pre-Operative Instructions  Congratulations, you have decided to take an important step to improving your quality of life.  You can be assured that the doctors of [facility] will be with you every step of the way.  1. Plan to be at the surgery center/hospital at least 1 (one) hour prior to your scheduled time unless otherwise directed by the surgical center/hospital staff.  You must have a responsible adult accompany you, remain during the surgery and drive you home.  Make sure you have directions to the surgical center/hospital and know how to get there on time. 2. For hospital based surgery you will need to obtain a history and physical form from your family physician within 1 month prior to the date of surgery- we will give you a form for you primary physician.  3. We make every effort to accommodate the date you request for surgery.  There are however, times where surgery dates or times have to be moved.  We will contact you as soon as possible if a change in schedule is required.   4. No Aspirin/Ibuprofen for one week before surgery.  If you are on aspirin, any non-steroidal anti-inflammatory medications (Mobic, Aleve, Ibuprofen) you should stop taking it 7 days prior to your surgery.  You make take Tylenol  For pain prior to surgery.  5. Medications- If you are taking daily heart and blood pressure medications, seizure, reflux, allergy, asthma, anxiety, pain or diabetes medications, make sure the surgery center/hospital is aware before the day of surgery so they may notify you which medications to take or avoid the day of surgery. 6. No food or drink after midnight the night before surgery unless directed otherwise by surgical center/hospital staff. 7. No alcoholic beverages 24 hours prior to surgery.  No smoking 24 hours prior to or 24 hours after surgery. 8. Wear loose pants or shorts- loose enough to fit over bandages, boots, and casts. 9. No slip on shoes, sneakers are best. 10. Bring  your boot with you to the surgery center/hospital.  Also bring crutches or a walker if your physician has prescribed it for you.  If you do not have this equipment, it will be provided for you after surgery. 11. If you have not been contracted by the surgery center/hospital by the day before your surgery, call to confirm the date and time of your surgery. 12. Leave-time from work may vary depending on the type of surgery you have.  Appropriate arrangements should be made prior to surgery with your employer. 13. Prescriptions will be provided immediately following surgery by your doctor.  Have these filled as soon as possible after surgery and take the medication as directed. 14. Remove nail polish on the operative foot. 15. Wash the night before surgery.  The night before surgery wash the foot and leg well with the antibacterial soap provided and water paying special attention to beneath the toenails and in between the toes.  Rinse thoroughly with water and dry well with a towel.  Perform this wash unless told not to do so by your physician.  Enclosed: 1 Ice pack (please put in freezer the night before surgery)   1 Hibiclens skin cleaner   Pre-op Instructions  If you have any questions regarding the instructions, do not hesitate to call our office.  [place]: [address] [place], [place] [address] [phone]  [place]: [address] , [place] [address] [phone]  [place]: [address]  [place],  [address] [phone]  Dr. Richard   Tuchman DPM, Dr. Norman Regal DPM Dr. Richard Sikora DPM, Dr. M. Todd Hyatt DPM, Dr. Kathryn Egerton DPM 

## 2014-05-23 NOTE — Progress Notes (Signed)
   Subjective:    Patient ID: Rachel Luna, female    DOB: 22-Oct-1955, 59 y.o.   MRN: [id number]  HPI  Right foot pain, dorsal inj given last visit, relief was only for a short time, now same as before inj.  Review of Systems  All other systems reviewed and are negative.      Objective:   Physical Exam        Assessment & Plan:

## 2014-05-26 NOTE — Progress Notes (Signed)
Subjective:     Patient ID: [name], female   DOB: 1955-07-03, 59 y.o.   MRN: [id number]  HPI patient presents stating that the pain on top of my foot is like it was before and I feel like there is a spur there and it's making it hard for me to wear shoe gear comfortably   Review of Systems     Objective:   Physical Exam Neurovascular status intact with no health history changes noted and noted to have inflammatory changes dorsal foot with when deep palpation is attempted indicates spur formation. It is painful when pressed and she's tried wider shoes soaks and we have tried injections without relief    Assessment:     Exostosis with inflammatory changes dorsal foot    Plan:     Reviewed condition at great length and discussed conservative and surgical treatments. Patient has opted for surgical intervention I recommended tarsal exostectomy and I allowed her to read consent form going over alternative treatments and all complications as listed. We reviewed these and after answering questioned she signs consent form is given all preoperative instructions. I dispensed night splint for the postoperative period and gave her instructions on wearing it now to get used to it prior to surgery and she will have this procedure performed in the next several weeks

## 2014-06-08 ENCOUNTER — Other Ambulatory Visit: Payer: Self-pay | Admitting: Obstetrics

## 2014-06-11 ENCOUNTER — Encounter: Payer: Self-pay | Admitting: Podiatry

## 2014-06-11 DIAGNOSIS — M25775 Osteophyte, left foot: Secondary | ICD-10-CM | POA: Diagnosis not present

## 2014-06-14 NOTE — Progress Notes (Signed)
DOS 06/11/2014 Removal of bone spur from top left foot.

## 2014-06-21 ENCOUNTER — Ambulatory Visit (INDEPENDENT_AMBULATORY_CARE_PROVIDER_SITE_OTHER): Payer: Medicare Other | Admitting: Podiatry

## 2014-06-21 DIAGNOSIS — M898X9 Other specified disorders of bone, unspecified site: Secondary | ICD-10-CM

## 2014-06-21 DIAGNOSIS — M779 Enthesopathy, unspecified: Secondary | ICD-10-CM

## 2014-06-21 MED ORDER — HYDROCODONE-ACETAMINOPHEN 10-325 MG PO TABS: 1.0000 | ORAL_TABLET | Freq: Four times a day (QID) | ORAL | Status: DC | PRN

## 2014-06-24 NOTE — Progress Notes (Signed)
Subjective:     Patient ID: Rachel Luna, female   DOB: Jul 01, 1955, 59 y.o.   MRN: [id number]  HPI patient states I'm doing very well with my left foot and able to walk without much pain or swelling   Review of Systems     Objective:   Physical Exam Neurovascular status intact muscle strength adequate with well-healing surgical site dorsal left with stitches intact and wound edges well coapted    Assessment:     Doing well post tarsal exostectomy left    Plan:     Reapplied sterile dressing to the area advised on continued compression elevation and reappoint 2 weeks for suture removal or earlier if any issues should occur

## 2014-06-27 ENCOUNTER — Ambulatory Visit (INDEPENDENT_AMBULATORY_CARE_PROVIDER_SITE_OTHER): Payer: Medicare Other | Admitting: Podiatry

## 2014-06-27 ENCOUNTER — Ambulatory Visit (INDEPENDENT_AMBULATORY_CARE_PROVIDER_SITE_OTHER): Payer: Medicare Other

## 2014-06-27 ENCOUNTER — Encounter: Payer: Self-pay | Admitting: Podiatry

## 2014-06-27 DIAGNOSIS — IMO0002 Reserved for concepts with insufficient information to code with codable children: Secondary | ICD-10-CM

## 2014-06-27 DIAGNOSIS — Z9889 Other specified postprocedural states: Secondary | ICD-10-CM

## 2014-06-27 DIAGNOSIS — T148 Other injury of unspecified body region: Secondary | ICD-10-CM | POA: Diagnosis not present

## 2014-06-27 MED ORDER — CEPHALEXIN 500 MG PO CAPS: 500.0000 mg | ORAL_CAPSULE | Freq: Three times a day (TID) | ORAL | Status: DC

## 2014-06-27 MED ORDER — HYDROCODONE-ACETAMINOPHEN 10-325 MG PO TABS: 1.0000 | ORAL_TABLET | Freq: Four times a day (QID) | ORAL | Status: DC | PRN

## 2014-06-27 NOTE — Patient Instructions (Signed)
Continue daily dressing changes as discussed. Start antibiotics  Monitor for any signs/symptoms of infection. Call the office immediately if any occur or go directly to the emergency room. Call with any questions/concerns.

## 2014-07-02 ENCOUNTER — Telehealth: Payer: Self-pay | Admitting: *Deleted

## 2014-07-02 ENCOUNTER — Encounter: Payer: Self-pay | Admitting: Podiatry

## 2014-07-02 MED ORDER — CLINDAMYCIN HCL 300 MG PO CAPS: 300.0000 mg | ORAL_CAPSULE | Freq: Three times a day (TID) | ORAL | Status: DC

## 2014-07-02 NOTE — Telephone Encounter (Signed)
Called Rachel Luna to let her know we switched antibiotic to clindamycin, sent to pharmacy . Patient is aware

## 2014-07-02 NOTE — Progress Notes (Signed)
Patient ID: Rachel Luna, female   DOB: 07-May-1955, 59 y.o.   MRN: [id number]  Subjective: 59 year old female presents the office today status post left tarsal exostectomy performed on 06/11/2014 with Dr. Paulla Dolly. She states that last Friday she had the sutures removed and on Saturday she started to notice bleeding to the area. She states that since then she has had significant amount of drainage coming from the incision and that the incision is started to open up. She also states that her pain has significantly increased. She has been continuing dressing changes daily at home. She denies any systemic complaints as fevers, chills, nausea, vomiting.  Objective: AAO 3, NAD DP/PT pulses palpable, CRT less than 3 seconds Protective sensation intact with Simms once the monofilament Incision along the dorsal aspect the left foot with partial dehiscence distally. There is clear drainage coming from the incision. There is focal edema and a small amount of fluctuance directly around the incision. There is small amount of erythema directly around the incision however there is no ascending cellulitis. There is no crepitus. No malodor. There is significant tenderness to palpation overlying the incision. No other areas of tenderness to bilateral lower extremities.  Assessment: 59 year old female that is post left dorsal exostectomy with focal fluid collection, likely seroma  Plan: -X-rays were obtained and reviewed the patient -Treatment options were discussed included alternatives, risks, complications. -Due to the focal area of fluid collection around the incision incident female pain to this area I recommended an incision and drainage. Risks and complications were discussed the patient for which she understands and verbally consents. Under sterile conditions a total of 3 mL mixture of 2% lidocaine plain and 0.5% Marcaine plain was infiltrated in a regional block around the incision. Once anesthetized the skin  was prepped in a sterile fashion. Next utilizing a #15 blade a 0.5 cm incision was made through the prior incision. The area was decompressed and there is only serous drainage identified there is no purulence. The area was cultured. The wound was then irrigated and packed with saline moistened gauze and a dry sterile dressing. I discussed with the patient had a perform this dressing changes home as well. Prescribed Keflex. Continue a surgical shoe. Monitor for any signs or symptoms of worsening infection injected to call the office immediately should any occur or go directly to the emergency room. Follow-up in 1 week or sooner if any problems are to arise. In the meantime call any questions or concerns.

## 2014-07-03 ENCOUNTER — Telehealth: Payer: Self-pay | Admitting: Podiatry

## 2014-07-03 NOTE — Telephone Encounter (Signed)
Patient called to reschedule appt she had with Dr.Regal. Patient wants to see Dr.Wagoner and does not want to see Dr. Paulla Dolly.

## 2014-07-04 ENCOUNTER — Ambulatory Visit (INDEPENDENT_AMBULATORY_CARE_PROVIDER_SITE_OTHER): Payer: Medicare Other | Admitting: Podiatry

## 2014-07-04 VITALS — BP 98/72 | HR 66 | Temp 98.7°F | Resp 16

## 2014-07-04 DIAGNOSIS — Z9889 Other specified postprocedural states: Secondary | ICD-10-CM

## 2014-07-04 DIAGNOSIS — L03119 Cellulitis of unspecified part of limb: Secondary | ICD-10-CM

## 2014-07-04 MED ORDER — OXYCODONE-ACETAMINOPHEN 5-325 MG PO TABS: 1.0000 | ORAL_TABLET | ORAL | Status: DC | PRN

## 2014-07-04 MED ORDER — CIPROFLOXACIN HCL 500 MG PO TABS: 500.0000 mg | ORAL_TABLET | Freq: Two times a day (BID) | ORAL | Status: DC

## 2014-07-05 ENCOUNTER — Encounter: Payer: Medicare Other | Admitting: Podiatry

## 2014-07-06 ENCOUNTER — Emergency Department (HOSPITAL_COMMUNITY)
Admission: EM | Admit: 2014-07-06 | Discharge: 2014-07-06 | Disposition: A | Discharge status: Home / Self Care | Payer: Medicare Other | Attending: Emergency Medicine | Admitting: Emergency Medicine

## 2014-07-06 ENCOUNTER — Encounter (HOSPITAL_COMMUNITY): Payer: Self-pay | Admitting: Physical Medicine and Rehabilitation

## 2014-07-06 ENCOUNTER — Emergency Department (HOSPITAL_COMMUNITY): Payer: Medicare Other

## 2014-07-06 DIAGNOSIS — F319 Bipolar disorder, unspecified: Secondary | ICD-10-CM | POA: Insufficient documentation

## 2014-07-06 DIAGNOSIS — M1990 Unspecified osteoarthritis, unspecified site: Secondary | ICD-10-CM | POA: Insufficient documentation

## 2014-07-06 DIAGNOSIS — Z8639 Personal history of other endocrine, nutritional and metabolic disease: Secondary | ICD-10-CM | POA: Diagnosis not present

## 2014-07-06 DIAGNOSIS — Z791 Long term (current) use of non-steroidal anti-inflammatories (NSAID): Secondary | ICD-10-CM | POA: Diagnosis not present

## 2014-07-06 DIAGNOSIS — Z792 Long term (current) use of antibiotics: Secondary | ICD-10-CM | POA: Diagnosis not present

## 2014-07-06 DIAGNOSIS — Z86018 Personal history of other benign neoplasm: Secondary | ICD-10-CM | POA: Diagnosis not present

## 2014-07-06 DIAGNOSIS — E785 Hyperlipidemia, unspecified: Secondary | ICD-10-CM | POA: Insufficient documentation

## 2014-07-06 DIAGNOSIS — Z7952 Long term (current) use of systemic steroids: Secondary | ICD-10-CM | POA: Diagnosis not present

## 2014-07-06 DIAGNOSIS — Y848 Other medical procedures as the cause of abnormal reaction of the patient, or of later complication, without mention of misadventure at the time of the procedure: Secondary | ICD-10-CM | POA: Insufficient documentation

## 2014-07-06 DIAGNOSIS — I10 Essential (primary) hypertension: Secondary | ICD-10-CM | POA: Insufficient documentation

## 2014-07-06 DIAGNOSIS — T8131XA Disruption of external operation (surgical) wound, not elsewhere classified, initial encounter: Secondary | ICD-10-CM | POA: Diagnosis not present

## 2014-07-06 DIAGNOSIS — G8929 Other chronic pain: Secondary | ICD-10-CM | POA: Diagnosis not present

## 2014-07-06 DIAGNOSIS — K219 Gastro-esophageal reflux disease without esophagitis: Secondary | ICD-10-CM | POA: Diagnosis not present

## 2014-07-06 DIAGNOSIS — G8918 Other acute postprocedural pain: Secondary | ICD-10-CM | POA: Insufficient documentation

## 2014-07-06 DIAGNOSIS — Z79899 Other long term (current) drug therapy: Secondary | ICD-10-CM | POA: Diagnosis not present

## 2014-07-06 DIAGNOSIS — R609 Edema, unspecified: Secondary | ICD-10-CM | POA: Diagnosis not present

## 2014-07-06 DIAGNOSIS — M25562 Pain in left knee: Secondary | ICD-10-CM

## 2014-07-06 MED ORDER — ONDANSETRON 4 MG PO TBDP
4.0000 mg | ORAL_TABLET | Freq: Once | ORAL | Status: AC
  Administered 2014-07-06: 4 mg via ORAL
  Filled 2014-07-06: qty 1

## 2014-07-06 MED ORDER — OXYCODONE-ACETAMINOPHEN 5-325 MG PO TABS
1.0000 | ORAL_TABLET | Freq: Once | ORAL | Status: AC
  Administered 2014-07-06: 1 via ORAL
  Filled 2014-07-06: qty 1

## 2014-07-06 NOTE — ED Notes (Signed)
Pt is in stable condition and is escorted from ED by staff via wheelchair.

## 2014-07-06 NOTE — Progress Notes (Signed)
VASCULAR LAB PRELIMINARY  PRELIMINARY  PRELIMINARY  PRELIMINARY  Left lower extremity venous Doppler completed.    Preliminary report:  There is no DVT or SVT noted in the left lower extremity.  Marrietta Thunder, RVT 07/06/2014, 11:04 AM

## 2014-07-06 NOTE — Discharge Instructions (Signed)
Edema °Edema is an abnormal buildup of fluids in your body tissues. Edema is somewhat dependent on gravity to pull the fluid to the lowest place in your body. That makes the condition more common in the legs and thighs (lower extremities). Painless swelling of the feet and ankles is common and becomes more likely as you get older. It is also common in looser tissues, like around your eyes.  °When the affected area is squeezed, the fluid may move out of that spot and leave a dent for a few moments. This dent is called pitting.  °CAUSES  °There are many possible causes of edema. Eating too much salt and being on your feet or sitting for a long time can cause edema in your legs and ankles. Hot weather may make edema worse. Common medical causes of edema include: °· Heart failure. °· Liver disease. °· Kidney disease. °· Weak blood vessels in your legs. °· Cancer. °· An injury. °· Pregnancy. °· Some medications. °· Obesity.  °SYMPTOMS  °Edema is usually painless. Your skin may look swollen or shiny.  °DIAGNOSIS  °Your health care provider may be able to diagnose edema by asking about your medical history and doing a physical exam. You may need to have tests such as X-rays, an electrocardiogram, or blood tests to check for medical conditions that may cause edema.  °TREATMENT  °Edema treatment depends on the cause. If you have heart, liver, or kidney disease, you need the treatment appropriate for these conditions. General treatment may include: °· Elevation of the affected body part above the level of your heart. °· Compression of the affected body part. Pressure from elastic bandages or support stockings squeezes the tissues and forces fluid back into the blood vessels. This keeps fluid from entering the tissues. °· Restriction of fluid and salt intake. °· Use of a water pill (diuretic). These medications are appropriate only for some types of edema. They pull fluid out of your body and make you urinate more often. This  gets rid of fluid and reduces swelling, but diuretics can have side effects. Only use diuretics as directed by your health care provider. °HOME CARE INSTRUCTIONS  °· Keep the affected body part above the level of your heart when you are lying down.   °· Do not sit still or stand for prolonged periods.   °· Do not put anything directly under your knees when lying down. °· Do not wear constricting clothing or garters on your upper legs.   °· Exercise your legs to work the fluid back into your blood vessels. This may help the swelling go down.   °· Wear elastic bandages or support stockings to reduce ankle swelling as directed by your health care provider.   °· Eat a low-salt diet to reduce fluid if your health care provider recommends it.   °· Only take medicines as directed by your health care provider.  °SEEK MEDICAL CARE IF:  °· Your edema is not responding to treatment. °· You have heart, liver, or kidney disease and notice symptoms of edema. °· You have edema in your legs that does not improve after elevating them.   °· You have sudden and unexplained weight gain. °SEEK IMMEDIATE MEDICAL CARE IF:  °· You develop shortness of breath or chest pain.   °· You cannot breathe when you lie down. °· You develop pain, redness, or warmth in the swollen areas.   °· You have heart, liver, or kidney disease and suddenly get edema. °· You have a fever and your symptoms suddenly get worse. °MAKE SURE YOU:  °·   Understand these instructions.  Will watch your condition.  Will get help right away if you are not doing well or get worse. Document Released: 02/22/2005 Document Revised: 07/09/2013 Document Reviewed: 12/15/2012 [facility] Patient Information 2015 [place], [place]. This information is not intended to replace advice given to you by your health care provider. Make sure you discuss any questions you have with your health care provider.  Wound Dehiscence Wound dehiscence is when a surgical cut (incision) breaks open  and does not heal properly after surgery. It usually happens 7-10 days after surgery. This can be a serious condition. It is important to identify and treat this condition early.  CAUSES  Some common causes of wound dehiscence include:  Stretching of the wound area. This may be caused by lifting, vomiting, violent coughing, or straining during bowel movements.  Wound infection.  Early stitch (suture) removal. RISK FACTORS Various things can increase your risk of developing wound dehiscence, including:  Obesity.  Lung disease.  Smoking.  Poor nutrition.  Contamination during surgery. SIGNS AND SYMPTOMS  Bleeding from the wound.  Pain.  Fever.  Wound starts breaking open. DIAGNOSIS  Your health care provider may diagnose wound dehiscence by monitoring the incision and noting any changes in the wound. These changes can include an increase in drainage or pain. The health care provider may also ask you if you have noticed any stretching or tearing of the wound.  Wound cultures may be taken to determine if there is an infection.  Imaging studies, such as an MRI scan or CT scan, may be done to determine if there is a collection of pus or fluid in the wound area. TREATMENT Treatment may include:  Wound care.  Surgical repair.  Antibiotic medicine to treat or prevent infection.  Medicines to reduce pain and swelling. HOME CARE INSTRUCTIONS   Only take over-the-counter or prescription medicines for pain, discomfort, or fever as directed by your health care provider. Taking pain medicine 30 minutes before changing a bandage (dressing) can help relieve pain.  Take your antibiotics as directed. Finish them even if you start to feel better.  Gently wash the area with mild soap and water 2 times a day, or as directed. Rinse off the soap. Pat the area dry with a clean towel. Do not rub the wound. This may cause bleeding.  Follow your health care provider's instructions for  how often you need to change the dressing and packing inside. Wash your hands well before and after changing your dressing. Apply a dressing to the wound as directed.  Take showers. Do not soak the wound, bathe, swim, or use a hot tub until directed by your health care provider.  Avoid exercises that make you sweat heavily.  Use anti-itch medicine as directed by your health care provider. The wound may itch when it is healing. Do not pick or scratch at the wound.  Do not lift more than 10 pounds (4.5 kg) until the wound is healed, or as directed by your health care provider.  Keep all follow-up appointments as directed. SEEK MEDICAL CARE IF:  You have excessive bleeding from your surgical wound.  Your wound does not seem to be healing properly.  You have a fever. SEEK IMMEDIATE MEDICAL CARE IF:   You have increased swelling or redness around the wound.  You have increasing pain in the wound.  You have an increasing amount of pus coming from the wound.  Your wound breaks open farther. MAKE SURE YOU:  Understand these instructions.  Will watch your condition.  Will get help right away if you are not doing well or get worse. Document Released: 05/15/2003 Document Revised: 02/27/2013 Document Reviewed: 10/30/2012 [facility] Patient Information 2015 [place], [place]. This information is not intended to replace advice given to you by your health care provider. Make sure you discuss any questions you have with your health care provider.

## 2014-07-06 NOTE — ED Notes (Signed)
Pt post op 25 days. Pt does take estradiol.

## 2014-07-06 NOTE — ED Notes (Signed)
Pt presents to department for evaluation of L foot pain and swelling. Recently had bone spur repair on 06/11/14. Now reports increased pain and swelling to L foot. No relief with antibiotics/pain medication. 9/10 pain upon arrival to ED.

## 2014-07-06 NOTE — ED Provider Notes (Signed)
CSN: [id number]     Arrival date & time 07/06/14  1157 History   First MD Initiated Contact with Patient 07/06/14 [phone]     Chief Complaint  Patient presents with  . Foot Pain  . Edema     (Consider location/radiation/quality/duration/timing/severity/associated sxs/prior Treatment) Patient is a 59 y.o. female presenting with lower extremity pain.  Foot Pain   59 year old female presents the ED today status post left tarsal exostectomy performed on 06/11/2014 by Dr. Paulla Dolly. She states that Friday 4/22 she had the sutures removed and on Saturday she started to notice heavy bleeding and wound dehiscence. She states that since then she has had significant amount of drainage coming from the incision . She also states that her pain has significantly increased. With increased tenderness swelling into the calf. She has been continuing dressing changes daily at home. Patient saw Dr. Earleen Newport of [facility] podiatric medicine on 4/28. Dr. Earleen Newport performed an incision and drainage of what appeared to be only serous fluid under the wound. Patient was placed on clindamycin. She was told to follow-up at the emergency Department in 2 days if her symptoms were no better. Patient comes in today for evaluation of unresolved symptoms. She does endorse chills and heat last night. Patient denies a history of previous DVTs. She denies significant pain in the wound, but does have achiness in her left leg and significant swelling which is worse than the right. Risk factors for DVT development include oral estradiol use for symptoms of menopause. Patient denies chest pain or shortness of breath Past Medical History  Diagnosis Date  . Hypertension   . Depression   . Bipolar disorder   . Arthritis   . Hyperlipidemia   . GERD (gastroesophageal reflux disease)   . Gout   . Fibroids   . chronic low back pain   . Obesity   . Chronic low back pain 02/18/2014   Past Surgical History  Procedure Laterality Date  . Spine  surgery    . Knee arthroplasty    . Cholecystectomy    . Ovarian cyst removal    . Abdominal hysterectomy     Family History  Problem Relation Age of Onset  . Diabetes Mother   . Cancer Mother     breast  . Heart disease Mother   . Mental illness Mother     bipolar disorder  . Stroke Mother   . Diabetes Father   . Hypertension Father    History  Substance Use Topics  . Smoking status: Never Smoker   . Smokeless tobacco: Never Used  . Alcohol Use: No   OB History    Gravida Para Term Preterm AB TAB SAB Ectopic Multiple Living   0 0 0 0 0 0 0 0 0 0      Review of Systems   Ten systems reviewed and are negative for acute change, except as noted in the HPI.   Allergies  Lisinopril  Home Medications   Prior to Admission medications   Medication Sig Start Date End Date Taking? Authorizing Provider  amitriptyline (ELAVIL) 25 MG tablet Take 25 mg by mouth at bedtime.  07/11/12   Historical Provider, MD  baclofen (LIORESAL) 10 MG tablet Take 1 tablet (10 mg total) by mouth 3 (three) times daily. 02/26/14   Kathrynn Ducking, MD  carbamazepine (TEGRETOL) 200 MG tablet take 1 and 1/2 tablet by mouth twice a day 02/25/14   Kathrynn Ducking, MD  cephALEXin (KEFLEX) 500 MG capsule Take  1 capsule (500 mg total) by mouth 3 (three) times daily. 06/27/14   Trula Slade, DPM  Cholecalciferol (VITAMIN D3) 2000 UNITS TABS Take 2,000 mg by mouth daily.    Historical Provider, MD  ciprofloxacin (CIPRO) 500 MG tablet Take 1 tablet (500 mg total) by mouth 2 (two) times daily. 07/04/14   Trula Slade, DPM  clindamycin (CLEOCIN) 300 MG capsule Take 1 capsule (300 mg total) by mouth 3 (three) times daily. 07/02/14   Trula Slade, DPM  clonazePAM (KLONOPIN) 0.5 MG tablet Take 0.5 mg by mouth 2 (two) times daily as needed for anxiety.    Historical Provider, MD  DULoxetine (CYMBALTA) 60 MG capsule Take 60 mg by mouth 2 (two) times daily.  07/11/12   Historical Provider, MD  estradiol  (ESTRACE) 1 MG tablet take 1 tablet by mouth once daily 06/09/14   Shelly Bombard, MD  fenofibrate (TRICOR) 145 MG tablet Take 145 mg by mouth daily.  06/15/12   Historical Provider, MD  fluticasone (FLONASE) 50 MCG/ACT nasal spray Place 2 sprays into the nose daily.  08/08/12   Historical Provider, MD  gabapentin (NEURONTIN) 800 MG tablet take 1 tablet by mouth four times a day 04/15/14   Kathrynn Ducking, MD  HYDROcodone-acetaminophen [facility]) 10-325 MG per tablet Take 1 tablet by mouth every 6 (six) hours as needed. 06/27/14   Trula Slade, DPM  losartan (COZAAR) 25 MG tablet Take 25 mg by mouth daily.  07/11/12   Historical Provider, MD  meloxicam (MOBIC) 15 MG tablet Take 15 mg by mouth daily.  07/11/12   Historical Provider, MD  omeprazole (PRILOSEC) 20 MG capsule Take 20 mg by mouth daily.  07/11/12   Historical Provider, MD  oxyCODONE-acetaminophen (PERCOCET/ROXICET) 5-325 MG per tablet Take 1 tablet by mouth every 4 (four) hours as needed for severe pain. 07/04/14   Trula Slade, DPM  pramipexole (MIRAPEX) 0.25 MG tablet 1 tablet during the day, 3 tablets at night 03/25/14   Kathrynn Ducking, MD  SEROQUEL XR 150 MG 24 hr tablet Take 150 mg by mouth daily. 01/21/14   Historical Provider, MD   BP 122/91 mmHg  Pulse 88  Temp(Src) 97.8 F (36.6 C) (Oral)  Resp 18  SpO2 93% Physical Exam  Constitutional: She is oriented to person, place, and time. She appears well-developed and well-nourished. No distress.  Obese female in no acute distress.  HENT:  Head: Normocephalic and atraumatic.  Eyes: Conjunctivae and EOM are normal. Pupils are equal, round, and reactive to light. No scleral icterus.  Neck: Normal range of motion.  Cardiovascular: Normal rate, regular rhythm and normal heart sounds.  Exam reveals no gallop and no friction rub.   No murmur heard. Left leg with tense swelling from the calf into the foot, warm, skin is stretched and shiny. DP and PT pulses are palpable. There is minimal  wound dehiscence without drainage and wound is only minimally tender. Mild erythema of the inferior portion of the lower extremity.  Pulmonary/Chest: Effort normal and breath sounds normal. No respiratory distress.  Abdominal: Soft. Bowel sounds are normal. She exhibits no distension and no mass. There is no tenderness. There is no guarding.  Neurological: She is alert and oriented to person, place, and time.  Skin: Skin is warm and dry. She is not diaphoretic.  Nursing note and vitals reviewed.   ED Course  Procedures (including critical care time) Labs Review Labs Reviewed - No data to display  Imaging Review No results found.   EKG Interpretation None      MDM   Final diagnoses:  Edema  Post-op pain  Wound dehiscence, initial encounter    9:29 AM BP 122/91 mmHg  Pulse 88  Temp(Src) 97.8 F (36.6 C) (Oral)  Resp 18  SpO2 93% Patient with left lower extremity edema, I favor diagnosis of DVT, more than diagnosis of infection. Patient did have an x-ray of the foot 2 days ago with Dr. Earleen Newport. However, I'm unable to review those images. She is afebrile and hemodynamically stable  11:07 AM BP 113/56 mmHg  Pulse 83  Temp(Src) 97.8 F (36.6 C) (Oral)  Resp 18  SpO2 95% DVT study negative.  11:33 AM Patient with negative DVT study, no concern for osteomyelitis. Minimal wound tenderness. I feel that her edema is likely secondary to obesity and dependency. Six-inch Ace wrap applied to the leg for compression. Patient will be advised to elevate the leg above heart level. I do not feel that this is infection and failed antibiotic. I have asked the patient to follow up with her podiatrist in 2 days for wound recheck. She appears stable for discharge. Afebrile, hemodynamically stable.  Margarita Mail, PA-C 07/06/14 1134  Serita Grit, MD 07/06/14 [phone]

## 2014-07-06 NOTE — ED Notes (Signed)
Vascular at bedside

## 2014-07-08 NOTE — Progress Notes (Signed)
Patient ID: [name], female   DOB: May 21, 1955, 59 y.o.   MRN: [id number]  Subjective: Ms. Dargis presents the office today status post left tarsal exostectomy performed on 06/11/2014 with Dr. Paulla Dolly. I last appointment there was an area of collection and an I&D was preformed. Cultures did reveal staph and she was changed to clindamycin. She states that she continues to have pain overlying the area and she's had some mild redness overlying the leg. She denies any systemic complaints such as fevers, chills, nausea, vomiting.   Objective: AAO 3, NAD DP/PT pulses palpable, CRT less than 3 seconds Protective sensation intact with Simms once the monofilament Incision along the dorsal aspect the left foot with partial dehiscence distally. There continues to be a mild amount of clear drainage coming from the incision although it does appear to be decreased from last appointment. There is focal edema currently around the incision. There is decreased erythema around the area and there is no ascending cellulitis. There is no areas of fluctuance or crepitus at this time.  No malodor. There is significant tenderness to palpation overlying the incision. There is faint erythema overlying the distal aspect of the leg however the erythema does not correlate to the area of the wound. These appear to be 2 distinct areas. No other areas of tenderness to bilateral lower extremities.  Assessment: 59 year old female that is post left dorsal exostectomy with localized infection and mild dehiscence of the wound   Plan: -Treatment options were discussed included alternatives, risks, complications. -It appears that the erythema is resolving around the wound although there is slight erythema to the leg however this is not course onto the wound and they're 2 distinct areas. Continue clindamycin I also will add ciprofloxacin for broader coverage. Continue with daily dressing changes as discussed. Monitor closely for any  clinical signs or symptoms of worsening infection and directed to call the office immediately should any occur or go to the emergency room. -Follow-up in 1 week or sooner if any problems are to arise. In the meantime call the office any questions or concerns or any changes symptoms.

## 2014-07-11 ENCOUNTER — Ambulatory Visit (INDEPENDENT_AMBULATORY_CARE_PROVIDER_SITE_OTHER): Payer: Medicare Other | Admitting: Podiatry

## 2014-07-11 ENCOUNTER — Encounter: Payer: Self-pay | Admitting: Podiatry

## 2014-07-11 ENCOUNTER — Encounter: Payer: Medicare Other | Admitting: Podiatry

## 2014-07-11 DIAGNOSIS — L03119 Cellulitis of unspecified part of limb: Secondary | ICD-10-CM

## 2014-07-11 DIAGNOSIS — L98491 Non-pressure chronic ulcer of skin of other sites limited to breakdown of skin: Secondary | ICD-10-CM

## 2014-07-11 DIAGNOSIS — L89891 Pressure ulcer of other site, stage 1: Secondary | ICD-10-CM

## 2014-07-11 DIAGNOSIS — Z9889 Other specified postprocedural states: Secondary | ICD-10-CM

## 2014-07-11 MED ORDER — COLLAGENASE 250 UNIT/GM EX OINT: 1.0000 "application " | TOPICAL_OINTMENT | Freq: Every day | CUTANEOUS | Status: DC

## 2014-07-11 MED ORDER — HYDROCODONE-ACETAMINOPHEN 10-325 MG PO TABS: 1.0000 | ORAL_TABLET | Freq: Four times a day (QID) | ORAL | Status: DC | PRN

## 2014-07-11 NOTE — Patient Instructions (Signed)
Continue daily dressing changes as discussed. If you are able to get the santyl, apply nickel thickness and a dressing daily. If you are unable to get the santyl, call the office and we will order another cream.  Finish course of antibiotics  Monitor for any signs/symptoms of infection. Call the office immediately if any occur or go directly to the emergency room. Call with any questions/concerns. Call with any questions/concerns/change in symptoms.

## 2014-07-12 ENCOUNTER — Telehealth: Payer: Self-pay | Admitting: Podiatry

## 2014-07-12 ENCOUNTER — Encounter: Payer: Self-pay | Admitting: Podiatry

## 2014-07-12 LAB — CBC WITH DIFFERENTIAL/PLATELET
BASOS: 1 %
Basophils Absolute: 0 10*3/uL (ref 0.0–0.2)
EOS (ABSOLUTE): 0.3 10*3/uL (ref 0.0–0.4)
Eos: 5 %
HEMOGLOBIN: 12.2 g/dL (ref 11.1–15.9)
Hematocrit: 37.5 % (ref 34.0–46.6)
IMMATURE GRANS (ABS): 0 10*3/uL (ref 0.0–0.1)
IMMATURE GRANULOCYTES: 0 %
LYMPHS: 34 %
Lymphocytes Absolute: 2.1 10*3/uL (ref 0.7–3.1)
MCH: 28.6 pg (ref 26.6–33.0)
MCHC: 32.5 g/dL (ref 31.5–35.7)
MCV: 88 fL (ref 79–97)
MONOS ABS: 0.4 10*3/uL (ref 0.1–0.9)
Monocytes: 7 %
NEUTROS PCT: 53 %
Neutrophils Absolute: 3.2 10*3/uL (ref 1.4–7.0)
PLATELETS: 217 10*3/uL (ref 150–379)
RBC: 4.27 x10E6/uL (ref 3.77–5.28)
RDW: 14.7 % (ref 12.3–15.4)
WBC: 6 10*3/uL (ref 3.4–10.8)

## 2014-07-12 LAB — BASIC METABOLIC PANEL
BUN / CREAT RATIO: 18 (ref 9–23)
BUN: 11 mg/dL (ref 6–24)
CO2: 27 mmol/L (ref 18–29)
Calcium: 9.7 mg/dL (ref 8.7–10.2)
Chloride: 99 mmol/L (ref 97–108)
Creatinine, Ser: 0.6 mg/dL (ref 0.57–1.00)
GFR calc Af Amer: 118 mL/min/{1.73_m2} (ref >59)
GFR, EST NON AFRICAN AMERICAN: 102 mL/min/{1.73_m2} (ref >59)
Glucose: 135 mg/dL — ABNORMAL HIGH (ref 65–99)
POTASSIUM: 4.3 mmol/L (ref 3.5–5.2)
SODIUM: 140 mmol/L (ref 134–144)

## 2014-07-12 LAB — HEMOGLOBIN A1C
ESTIMATED AVERAGE GLUCOSE: 143 mg/dL
Hgb A1c MFr Bld: 6.6 % — ABNORMAL HIGH (ref 4.8–5.6)

## 2014-07-12 LAB — C-REACTIVE PROTEIN: CRP: 12.6 mg/L — ABNORMAL HIGH (ref 0.0–4.9)

## 2014-07-12 LAB — SEDIMENTATION RATE: Sed Rate: 11 mm/hr (ref 0–40)

## 2014-07-12 NOTE — Telephone Encounter (Signed)
Call Ms. Ingle to inform her of the lab results. The CRP was elevated. Also, the HbA1c was elevated. I sent a letter to her PCP and I have faxed the results as well. Encouraged her to call her PCP and get a follow-up appointment. No other complaints at this time. Call with any questions/concerns/change in symptoms.

## 2014-07-12 NOTE — Progress Notes (Signed)
Patient ID: Rachel Luna, female   DOB: 08-22-1955, 59 y.o.   MRN: [id number]  Subjective: Ms. Wellman presents the office today status post left tarsal exostectomy performed on 06/11/2014. She says that she's been continuing the ciprofloxacin as well as the clindamycin and performing daily dressing changes. She does that she continues to get the area wet with showering. Afterwards she applies antibiotic ointment and a bandage. She states that she continues to have pain over the area. After last appointment she did go to the emergency department however she was not admitted and no further antibiotics or treatment was given. She denies any systemic complaints as fevers, chills, nausea, vomiting. Denies any calf pain, chest pain, shortness of breath. No other complaints at this time.  Objective: AAO 3, NAD DP/PT pulses palpable, CRT less than 3 seconds Protective sensation intact with Simms Weinstein monofilament Incision along the dorsal aspect the left foot with partial dehiscence although it appears to be superficial. The wound base is fibro-granular. At this time there is no drainage expressed from the wound. There is mild tenderness palpation directly upon the wound. There is an area of pink skin around the incision likely from new skin however there is no specific erythema, ascending cellulitis, fluctuance, crepitus, malodor. There is mild edema around the surgical site. There is no other areas of tenderness to bilateral lower extremes. There is no other areas of edema, erythema, increase in warmth bilaterally.  Assessment: 59 year old female that is post left dorsal exostectomy with resolved infection and superficial dehiscence of the wound   Plan: -Treatment options were discussed included alternatives, risks, complications. -Finish course of antibiotics. -Prescribed Santyl for the wound. If the patient's unable to get the Santyl we will prescribed Silvadene. -Will check HbA1c, ESR, CRP, CBC,  BMP. -Refill of hydrocodone. -Continue daily dressing changes with Santyl/Silvadene. Would hold off on showering. Continue a surgical shoe at all times. -A signs or symptoms of infection digits call the office in medial lesion any occur go directly to the emergency room. -Follow-up in 10 days or sooner if any problems are to arise. In the meantime call the office any questions or concerns or any changes symptoms.

## 2014-07-23 ENCOUNTER — Ambulatory Visit (INDEPENDENT_AMBULATORY_CARE_PROVIDER_SITE_OTHER): Payer: Medicare Other | Admitting: Podiatry

## 2014-07-23 VITALS — BP 98/72 | HR 66 | Resp 16

## 2014-07-23 DIAGNOSIS — T8131XD Disruption of external operation (surgical) wound, not elsewhere classified, subsequent encounter: Secondary | ICD-10-CM

## 2014-07-23 MED ORDER — HYDROCODONE-ACETAMINOPHEN 10-325 MG PO TABS: 1.0000 | ORAL_TABLET | Freq: Four times a day (QID) | ORAL | Status: DC | PRN

## 2014-07-23 MED ORDER — DOXYCYCLINE HYCLATE 100 MG PO TABS: 100.0000 mg | ORAL_TABLET | Freq: Two times a day (BID) | ORAL | Status: DC

## 2014-07-23 NOTE — Progress Notes (Signed)
Patient ID: Rachel Luna, female   DOB: 1955/06/12, 59 y.o.   MRN: [id number]  Subjective: [name] presents the office today status post left tarsal exostectomy performed on 06/11/2014 with Dr. Paulla Dolly. She states that she continues to have discomfort on the top of her left foot overlying the site of the incision. She states that he needs to be some redness around the area however she denies any red streaks or any extension of the redness since last appointment. She does take pain medication as needed to help with the pain. She's been applying anabolic ointment over the incision followed by a bandage daily. She denies any drainage or purulence. She denies any malodor. She states that she has not followed up with her primary care physician regards to A1c. She denies any systemic complaints as fevers, chills, nausea, vomiting. Denies any calf pain, chest pain, shortness of breath. No other complaints at this time.  Objective: AAO 3, NAD DP/PT pulses palpable, CRT less than 3 seconds Protective sensation intact with Simms Weinstein monofilament Incision along the dorsal aspect the left foot with partial dehiscence although it appears to be superficial and somewhat improved compared to last appointment. The wound base is fibro-granular. There is no drainage or purulence expressed from the wound. There is no ascending cellulitis. No areas of fluctuance or crepitus. No malodor. There is mild tenderness palpation directly upon the wound. There is an area of pink skin around the incision likely from new skin however there is no specific erythema/cellulitis. There is mild edema directly around the surgical site. There is no other areas of tenderness to bilateral lower extremes. There is no other areas of edema, erythema, increase in warmth bilaterally.  Assessment: 59 year old female that is post left dorsal exostectomy with resolved infection and superficial dehiscence of the wound   Plan: -Treatment options  were discussed included alternatives, risks, complications. -Continue daily dressing changes. Monitor closely for any clinical signs or symptoms of infection and directed to call the office immediately should any occur or go to the ER. -Pain medication as needed. -Continue surgical shoe at all times. -Follow-up in 1 week or sooner if any problems are to arise. In the meantime call the office any questions or concerns or any changes symptoms.

## 2014-07-25 ENCOUNTER — Encounter: Payer: Medicare Other | Admitting: Podiatry

## 2014-07-27 ENCOUNTER — Emergency Department (HOSPITAL_COMMUNITY): Payer: Medicare Other

## 2014-07-27 ENCOUNTER — Emergency Department (HOSPITAL_COMMUNITY)
Admission: EM | Admit: 2014-07-27 | Discharge: 2014-07-27 | Disposition: A | Discharge status: Home / Self Care | Payer: Medicare Other | Attending: Emergency Medicine | Admitting: Emergency Medicine

## 2014-07-27 ENCOUNTER — Encounter (HOSPITAL_COMMUNITY): Payer: Self-pay | Admitting: *Deleted

## 2014-07-27 DIAGNOSIS — F319 Bipolar disorder, unspecified: Secondary | ICD-10-CM | POA: Diagnosis not present

## 2014-07-27 DIAGNOSIS — Z792 Long term (current) use of antibiotics: Secondary | ICD-10-CM | POA: Diagnosis not present

## 2014-07-27 DIAGNOSIS — G8918 Other acute postprocedural pain: Secondary | ICD-10-CM | POA: Insufficient documentation

## 2014-07-27 DIAGNOSIS — K219 Gastro-esophageal reflux disease without esophagitis: Secondary | ICD-10-CM | POA: Diagnosis not present

## 2014-07-27 DIAGNOSIS — Z7951 Long term (current) use of inhaled steroids: Secondary | ICD-10-CM | POA: Insufficient documentation

## 2014-07-27 DIAGNOSIS — Z86018 Personal history of other benign neoplasm: Secondary | ICD-10-CM | POA: Insufficient documentation

## 2014-07-27 DIAGNOSIS — L7682 Other postprocedural complications of skin and subcutaneous tissue: Secondary | ICD-10-CM

## 2014-07-27 DIAGNOSIS — Z8739 Personal history of other diseases of the musculoskeletal system and connective tissue: Secondary | ICD-10-CM | POA: Diagnosis not present

## 2014-07-27 DIAGNOSIS — I10 Essential (primary) hypertension: Secondary | ICD-10-CM | POA: Diagnosis not present

## 2014-07-27 DIAGNOSIS — Z4801 Encounter for change or removal of surgical wound dressing: Secondary | ICD-10-CM | POA: Diagnosis present

## 2014-07-27 DIAGNOSIS — E785 Hyperlipidemia, unspecified: Secondary | ICD-10-CM | POA: Insufficient documentation

## 2014-07-27 DIAGNOSIS — Z79899 Other long term (current) drug therapy: Secondary | ICD-10-CM | POA: Insufficient documentation

## 2014-07-27 DIAGNOSIS — G8929 Other chronic pain: Secondary | ICD-10-CM | POA: Diagnosis not present

## 2014-07-27 DIAGNOSIS — Z9889 Other specified postprocedural states: Secondary | ICD-10-CM | POA: Insufficient documentation

## 2014-07-27 DIAGNOSIS — E669 Obesity, unspecified: Secondary | ICD-10-CM | POA: Diagnosis not present

## 2014-07-27 DIAGNOSIS — Z791 Long term (current) use of non-steroidal anti-inflammatories (NSAID): Secondary | ICD-10-CM | POA: Insufficient documentation

## 2014-07-27 LAB — CBC WITH DIFFERENTIAL/PLATELET
BASOS PCT: 1 % (ref 0–1)
Basophils Absolute: 0 10*3/uL (ref 0.0–0.1)
Eosinophils Absolute: 0.2 10*3/uL (ref 0.0–0.7)
Eosinophils Relative: 4 % (ref 0–5)
HEMATOCRIT: 38.3 % (ref 36.0–46.0)
HEMOGLOBIN: 12.9 g/dL (ref 12.0–15.0)
LYMPHS ABS: 2.2 10*3/uL (ref 0.7–4.0)
LYMPHS PCT: 39 % (ref 12–46)
MCH: 29.7 pg (ref 26.0–34.0)
MCHC: 33.7 g/dL (ref 30.0–36.0)
MCV: 88.2 fL (ref 78.0–100.0)
MONO ABS: 0.5 10*3/uL (ref 0.1–1.0)
Monocytes Relative: 8 % (ref 3–12)
Neutro Abs: 2.8 10*3/uL (ref 1.7–7.7)
Neutrophils Relative %: 48 % (ref 43–77)
Platelets: 227 10*3/uL (ref 150–400)
RBC: 4.34 MIL/uL (ref 3.87–5.11)
RDW: 13.6 % (ref 11.5–15.5)
WBC: 5.7 10*3/uL (ref 4.0–10.5)

## 2014-07-27 LAB — COMPREHENSIVE METABOLIC PANEL
ALBUMIN: 3.6 g/dL (ref 3.5–5.0)
ALT: 30 U/L (ref 14–54)
ANION GAP: 11 (ref 5–15)
AST: 30 U/L (ref 15–41)
Alkaline Phosphatase: 69 U/L (ref 38–126)
BILIRUBIN TOTAL: 0.3 mg/dL (ref 0.3–1.2)
BUN: 14 mg/dL (ref 6–20)
CALCIUM: 9.4 mg/dL (ref 8.9–10.3)
CO2: 24 mmol/L (ref 22–32)
Chloride: 102 mmol/L (ref 101–111)
Creatinine, Ser: 0.57 mg/dL (ref 0.44–1.00)
GFR calc Af Amer: >60 mL/min (ref >60)
GFR calc non Af Amer: >60 mL/min (ref >60)
GLUCOSE: 138 mg/dL — AB (ref 65–99)
POTASSIUM: 3.7 mmol/L (ref 3.5–5.1)
Sodium: 137 mmol/L (ref 135–145)
TOTAL PROTEIN: 6.2 g/dL — AB (ref 6.5–8.1)

## 2014-07-27 LAB — I-STAT CG4 LACTIC ACID, ED
LACTIC ACID, VENOUS: 1.08 mmol/L (ref 0.5–2.0)
Lactic Acid, Venous: 1.79 mmol/L (ref 0.5–2.0)

## 2014-07-27 MED ORDER — HYDROMORPHONE HCL 1 MG/ML IJ SOLN: 1.0000 mg | Freq: Once | INTRAMUSCULAR | Status: DC

## 2014-07-27 MED ORDER — HYDROMORPHONE HCL 1 MG/ML IJ SOLN
1.0000 mg | Freq: Once | INTRAMUSCULAR | Status: AC
  Administered 2014-07-27: 1 mg via INTRAVENOUS
  Filled 2014-07-27: qty 1

## 2014-07-27 MED ORDER — OXYCODONE-ACETAMINOPHEN 10-325 MG PO TABS: 1.0000 | ORAL_TABLET | Freq: Four times a day (QID) | ORAL | Status: DC | PRN

## 2014-07-27 NOTE — ED Notes (Signed)
Pt reports having bone spur since removed from left foot in April. Having poor healing of foot since surgery. Has been on antibiotics and reports foul odor, swelling, redness, drainage and fatigue.

## 2014-07-27 NOTE — ED Provider Notes (Signed)
CSN: [id number]     Arrival date & time 07/27/14  1428 History   First MD Initiated Contact with Patient 07/27/14 1627     Chief Complaint  Patient presents with  . Post-op Problem     (Consider location/radiation/quality/duration/timing/severity/associated sxs/prior Treatment) HPI Patient presents presently 6 weeks after elective excision of left foot bone spur on the dorsal surface. She now has concern for persistent pain, erythema about the site. Patient has switched podiatrists, and last saw her new practitioner 4 days ago. She has been evaluated for infection several times, including here, 4 weeks ago. Currently the patient describes severe pain focally about the incision site, denies fever, vomiting, loss of sensation sensation distally, change in swelling, which is chronically present. She denies other erythema, new drainage or discharge.  Past Medical History  Diagnosis Date  . Hypertension   . Depression   . Bipolar disorder   . Arthritis   . Hyperlipidemia   . GERD (gastroesophageal reflux disease)   . Gout   . Fibroids   . chronic low back pain   . Obesity   . Chronic low back pain 02/18/2014   Past Surgical History  Procedure Laterality Date  . Spine surgery    . Knee arthroplasty    . Cholecystectomy    . Ovarian cyst removal    . Abdominal hysterectomy     Family History  Problem Relation Age of Onset  . Diabetes Mother   . Cancer Mother     breast  . Heart disease Mother   . Mental illness Mother     bipolar disorder  . Stroke Mother   . Diabetes Father   . Hypertension Father    History  Substance Use Topics  . Smoking status: Never Smoker   . Smokeless tobacco: Never Used  . Alcohol Use: No   OB History    Gravida Para Term Preterm AB TAB SAB Ectopic Multiple Living   0 0 0 0 0 0 0 0 0 0      Review of Systems  Constitutional:       Per HPI, otherwise negative  HENT:       Per HPI, otherwise negative  Respiratory:       Per HPI,  otherwise negative  Cardiovascular:       Per HPI, otherwise negative  Gastrointestinal: Negative for nausea and vomiting.  Endocrine:       Negative aside from HPI  Genitourinary:       Neg aside from HPI   Musculoskeletal:       Per HPI, otherwise negative  Skin: Positive for color change and wound.  Allergic/Immunologic: Negative for immunocompromised state.  Neurological: Negative for syncope.      Allergies  Lisinopril  Home Medications   Prior to Admission medications   Medication Sig Start Date End Date Taking? Authorizing Provider  amitriptyline (ELAVIL) 25 MG tablet Take 25 mg by mouth at bedtime.  07/11/12   Historical Provider, MD  baclofen (LIORESAL) 10 MG tablet Take 1 tablet (10 mg total) by mouth 3 (three) times daily. 02/26/14   Kathrynn Ducking, MD  carbamazepine (TEGRETOL) 200 MG tablet take 1 and 1/2 tablet by mouth twice a day 02/25/14   Kathrynn Ducking, MD  cephALEXin (KEFLEX) 500 MG capsule Take 1 capsule (500 mg total) by mouth 3 (three) times daily. 06/27/14   Trula Slade, DPM  Cholecalciferol (VITAMIN D3) 2000 UNITS TABS Take 2,000 mg by mouth daily.  Historical Provider, MD  ciprofloxacin (CIPRO) 500 MG tablet Take 1 tablet (500 mg total) by mouth 2 (two) times daily. 07/04/14   Trula Slade, DPM  clindamycin (CLEOCIN) 300 MG capsule Take 1 capsule (300 mg total) by mouth 3 (three) times daily. 07/02/14   Trula Slade, DPM  clonazePAM (KLONOPIN) 0.5 MG tablet Take 0.5 mg by mouth 2 (two) times daily as needed for anxiety.    Historical Provider, MD  collagenase (SANTYL) ointment Apply 1 application topically daily. 07/11/14   Trula Slade, DPM  doxycycline (VIBRA-TABS) 100 MG tablet Take 1 tablet (100 mg total) by mouth 2 (two) times daily. 07/23/14   Trula Slade, DPM  DULoxetine (CYMBALTA) 60 MG capsule Take 60 mg by mouth 2 (two) times daily.  07/11/12   Historical Provider, MD  estradiol (ESTRACE) 1 MG tablet take 1 tablet by mouth  once daily 06/09/14   Shelly Bombard, MD  fenofibrate (TRICOR) 145 MG tablet Take 145 mg by mouth daily.  06/15/12   Historical Provider, MD  fluticasone (FLONASE) 50 MCG/ACT nasal spray Place 2 sprays into the nose daily.  08/08/12   Historical Provider, MD  gabapentin (NEURONTIN) 800 MG tablet take 1 tablet by mouth four times a day 04/15/14   Kathrynn Ducking, MD  HYDROcodone-acetaminophen [facility]) 10-325 MG per tablet Take 1 tablet by mouth every 6 (six) hours as needed. 07/23/14   Trula Slade, DPM  losartan (COZAAR) 25 MG tablet Take 25 mg by mouth daily.  07/11/12   Historical Provider, MD  meloxicam (MOBIC) 15 MG tablet Take 15 mg by mouth daily.  07/11/12   Historical Provider, MD  omeprazole (PRILOSEC) 20 MG capsule Take 20 mg by mouth daily.  07/11/12   Historical Provider, MD  oxyCODONE-acetaminophen (PERCOCET/ROXICET) 5-325 MG per tablet Take 1 tablet by mouth every 4 (four) hours as needed for severe pain. 07/04/14   Trula Slade, DPM  pramipexole (MIRAPEX) 0.25 MG tablet 1 tablet during the day, 3 tablets at night 03/25/14   Kathrynn Ducking, MD  SEROQUEL XR 150 MG 24 hr tablet Take 150 mg by mouth daily. 01/21/14   Historical Provider, MD   BP 159/96 mmHg  Pulse 97  Temp(Src) 98.7 F (37.1 C) (Oral)  Resp 20  SpO2 93% Physical Exam  Constitutional: She is oriented to person, place, and time. She appears well-developed and well-nourished. No distress.  HENT:  Head: Normocephalic and atraumatic.  Eyes: Conjunctivae and EOM are normal.  Cardiovascular: Normal rate, regular rhythm and intact distal pulses.   Pulmonary/Chest: Effort normal and breath sounds normal. No stridor. No respiratory distress.  Abdominal: She exhibits no distension.  Musculoskeletal: She exhibits no edema.  Neurological: She is alert and oriented to person, place, and time. No cranial nerve deficit.  Patient has full sensation, and strength in the ankle and foot on the ipsilateral side  Skin: Skin is warm  and dry.     Psychiatric: She has a normal mood and affect.  Nursing note and vitals reviewed.   ED Course  Procedures (including critical care time) Labs Review Labs Reviewed  COMPREHENSIVE METABOLIC PANEL - Abnormal; Notable for the following:    Glucose, Bld 138 (*)    Total Protein 6.2 (*)    All other components within normal limits  CBC WITH DIFFERENTIAL/PLATELET  I-STAT CG4 LACTIC ACID, ED  I-STAT CG4 LACTIC ACID, ED    Imaging Review Dg Foot Complete Left  07/27/2014  CLINICAL DATA:  Midfoot dorsal postop erythema, pain.  EXAM: LEFT FOOT - COMPLETE 3+ VIEW  COMPARISON:  07/06/2014  FINDINGS: Soft tissue swelling along the dorsum of the foot. Degenerative changes in the midfoot. No acute focal bony abnormality or change since prior study. Plantar calcaneal spur again noted with coarse calcifications in the plantar aponeurosis as before.  IMPRESSION: No acute bony abnormality.  No change since prior study.   Electronically Signed   By: Rolm Baptise M.D.   On: 07/27/2014 18:01    6:30 PM I reviewed the results (including imaging as performed), agree with the interpretation  On repeat exam the patient appears better.  We reviewed all findings.  Wound re-wrapped.  MDM  Patient presents with concern of ongoing pain, redness have a postsurgical site. Here patient is awake, alert, afebrile. No distal neurovascular compromise, no evidence for cellulitis or osteomyelitis. Pain diminished with additional analgesia here. Patient discharged stable condition with follow-up in 48 hours.     Carmin Muskrat, MD 07/27/14 [phone]

## 2014-07-27 NOTE — Discharge Instructions (Signed)
As discussed, your evaluation today has been largely reassuring.  But, it is important that you monitor your condition carefully, and do not hesitate to return to the ED if you develop new, or concerning changes in your condition. ? ?Otherwise, please follow-up with your physician for appropriate ongoing care. ? ?

## 2014-07-30 ENCOUNTER — Encounter: Payer: Self-pay | Admitting: Podiatry

## 2014-07-30 ENCOUNTER — Ambulatory Visit (INDEPENDENT_AMBULATORY_CARE_PROVIDER_SITE_OTHER): Payer: Medicare Other | Admitting: Podiatry

## 2014-07-30 ENCOUNTER — Telehealth: Payer: Self-pay | Admitting: Neurology

## 2014-07-30 DIAGNOSIS — T8131XD Disruption of external operation (surgical) wound, not elsewhere classified, subsequent encounter: Secondary | ICD-10-CM

## 2014-07-30 DIAGNOSIS — Z9889 Other specified postprocedural states: Secondary | ICD-10-CM

## 2014-07-30 MED ORDER — HYDROCODONE-ACETAMINOPHEN 5-325 MG PO TABS: 1.0000 | ORAL_TABLET | ORAL | Status: DC | PRN

## 2014-07-30 NOTE — Telephone Encounter (Signed)
I called the patient. She requested to come in earlier than her appointment scheduled on 6/20. She stated she is not falling but she gets fatigued and tired a lot and when that happens she has to just sit down where she is and stay there for a few minutes. Appointment scheduled for tomorrow 5/25 at 8 AM.

## 2014-07-30 NOTE — Telephone Encounter (Signed)
Patient is calling and would like to see Dr. Jannifer Franklin prior to her scheduled date of 6/15. She states she is falling down, dropping things and can hardly function. Is there any way she could be worked in?  Please call her. Thanks!

## 2014-07-31 ENCOUNTER — Encounter: Payer: Self-pay | Admitting: Neurology

## 2014-07-31 ENCOUNTER — Ambulatory Visit (INDEPENDENT_AMBULATORY_CARE_PROVIDER_SITE_OTHER): Payer: Medicare Other | Admitting: Neurology

## 2014-07-31 VITALS — BP 141/97 | HR 90 | Ht 64.0 in | Wt 275.6 lb

## 2014-07-31 DIAGNOSIS — G4710 Hypersomnia, unspecified: Secondary | ICD-10-CM

## 2014-07-31 DIAGNOSIS — G8929 Other chronic pain: Secondary | ICD-10-CM

## 2014-07-31 DIAGNOSIS — R42 Dizziness and giddiness: Secondary | ICD-10-CM | POA: Diagnosis not present

## 2014-07-31 DIAGNOSIS — R5382 Chronic fatigue, unspecified: Secondary | ICD-10-CM

## 2014-07-31 DIAGNOSIS — M545 Low back pain: Secondary | ICD-10-CM

## 2014-07-31 DIAGNOSIS — G4730 Sleep apnea, unspecified: Secondary | ICD-10-CM | POA: Diagnosis not present

## 2014-07-31 DIAGNOSIS — G2581 Restless legs syndrome: Secondary | ICD-10-CM | POA: Diagnosis not present

## 2014-07-31 NOTE — Patient Instructions (Signed)
Restless Legs Syndrome Restless legs syndrome is a movement disorder. It may also be called a sensorimotor disorder.  CAUSES  No one knows what specifically causes restless legs syndrome, but it tends to run in families. It is also more common in people with low iron, in pregnancy, in people who need dialysis, and those with nerve damage (neuropathy).Some medications may make restless legs syndrome worse.Those medications include drugs to treat high blood pressure, some heart conditions, nausea, colds, allergies, and depression. SYMPTOMS Symptoms include uncomfortable sensations in the legs. These leg sensations are worse during periods of inactivity or rest. They are also worse while sitting or lying down. Individuals that have the disorder describe sensations in the legs that feel like:  Pulling.  Drawing.  Crawling.  Worming.  Boring.  Tingling.  Pins and needles.  Prickling.  Pain. The sensations are usually accompanied by an overwhelming urge to move the legs. Sudden muscle jerks may also occur. Movement provides temporary relief from the discomfort. In rare cases, the arms may also be affected. Symptoms may interfere with going to sleep (sleep onset insomnia). Restless legs syndrome may also be related to periodic limb movement disorder (PLMD). PLMD is another more common motor disorder. It also causes interrupted sleep. The symptoms from PLMD usually occur most often when you are awake. TREATMENT  Treatment for restless legs syndrome is symptomatic. This means that the symptoms are treated.   Massage and cold compresses may provide temporary relief.  Walk, stretch, or take a cold or hot bath.  Get regular exercise and a good night's sleep.  Avoid caffeine, alcohol, nicotine, and medications that can make it worse.  Do activities that provide mental stimulation like discussions, needlework, and video games. These may be helpful if you are not able to walk or stretch. Some  medications are effective in relieving the symptoms. However, many of these medications have side effects. Ask your caregiver about medications that may help your symptoms. Correcting iron deficiency may improve symptoms for some patients. Document Released: 02/12/2002 Document Revised: 07/09/2013 Document Reviewed: 05/21/2010 Exit[employer] Patient Information 2015 [employer]. This information is not intended to replace advice given to you by your health care provider. Make sure you discuss any questions you have with your health care provider.  

## 2014-07-31 NOTE — Progress Notes (Signed)
Reason for visit: Restless leg syndrome  Rachel Luna is an 59 y.o. female  History of present illness:  Rachel Luna is a 59 year old right-handed white female with a history of marked obesity and restless leg syndrome. The patient has done quite well with the use of Mirapex with the restless legs. She did not tolerate the Neupro patch secondary to skin irritation. The patient has had new symptoms that began in early April 2016. The patient has begun having increased problems with fatigue, and excessive daytime drowsiness. She will indicate that she will wake up frequently at night, but she will go back to sleep easily. In the morning, when she wakes up she feels disoriented frequently. She has a sensation of feeling woozy or dizzy when she is standing up. She is not had any blackout events, but she may drop objects out of her hand. The patient indicates that she has had multiple tick bites recently. The patient indicates that she does snore at night. She returns to this office for further evaluation. The patient also reports a decrease in memory and concentration recently.  Past Medical History  Diagnosis Date  . Hypertension   . Depression   . Bipolar disorder   . Arthritis   . Hyperlipidemia   . GERD (gastroesophageal reflux disease)   . Gout   . Fibroids   . chronic low back pain   . Obesity   . Chronic low back pain 02/18/2014    Past Surgical History  Procedure Laterality Date  . Spine surgery    . Knee arthroplasty    . Cholecystectomy    . Ovarian cyst removal    . Abdominal hysterectomy    . Foot surgery Left     Family History  Problem Relation Age of Onset  . Diabetes Mother   . Cancer Mother     breast  . Heart disease Mother   . Mental illness Mother     bipolar disorder  . Stroke Mother   . Diabetes Father   . Hypertension Father     Social history:  reports that she has never smoked. She has never used smokeless tobacco. She reports that she does not  drink alcohol or use illicit drugs.    Allergies  Allergen Reactions  . Lisinopril Cough  . Neupro [Rotigotine] Rash    Medications:  Prior to Admission medications   Medication Sig Start Date End Date Taking? Authorizing Provider  amitriptyline (ELAVIL) 25 MG tablet Take 25 mg by mouth at bedtime.  07/11/12  Yes Historical Provider, MD  baclofen (LIORESAL) 10 MG tablet Take 1 tablet (10 mg total) by mouth 3 (three) times daily. 02/26/14  Yes Kathrynn Ducking, MD  carbamazepine (TEGRETOL) 200 MG tablet take 1 and 1/2 tablet by mouth twice a day 02/25/14  Yes Kathrynn Ducking, MD  Cholecalciferol (VITAMIN D3) 2000 UNITS TABS Take 2,000 mg by mouth daily.   Yes Historical Provider, MD  clonazePAM (KLONOPIN) 0.5 MG tablet Take 0.5 mg by mouth 3 (three) times daily as needed for anxiety.    Yes Historical Provider, MD  collagenase (SANTYL) ointment Apply 1 application topically daily. 07/11/14  Yes Trula Slade, DPM  doxycycline (VIBRA-TABS) 100 MG tablet Take 1 tablet (100 mg total) by mouth 2 (two) times daily. Patient taking differently: Take 100 mg by mouth 2 (two) times daily. 10 day course started 07/23/14 07/23/14  Yes Trula Slade, DPM  DULoxetine (CYMBALTA) 60 MG capsule Take  120 mg by mouth daily.  07/11/12  Yes Historical Provider, MD  estradiol (ESTRACE) 1 MG tablet take 1 tablet by mouth once daily 06/09/14  Yes Shelly Bombard, MD  fenofibrate (TRICOR) 145 MG tablet Take 145 mg by mouth daily.  06/15/12  Yes Historical Provider, MD  fluticasone (FLONASE) 50 MCG/ACT nasal spray Place 2 sprays into both nostrils daily as needed (seasonal allergies).  08/08/12  Yes Historical Provider, MD  gabapentin (NEURONTIN) 800 MG tablet take 1 tablet by mouth four times a day 04/15/14  Yes Kathrynn Ducking, MD  HYDROcodone-acetaminophen (NORCO/VICODIN) 5-325 MG per tablet Take 1 tablet by mouth every 4 (four) hours as needed for moderate pain. 07/30/14  Yes Trula Slade, DPM  losartan (COZAAR)  25 MG tablet Take 50 mg by mouth daily.  07/11/12  Yes Historical Provider, MD  meloxicam (MOBIC) 15 MG tablet Take 15 mg by mouth daily.  07/11/12  Yes Historical Provider, MD  Olopatadine HCl 0.2 % SOLN Place 1 drop into both eyes daily as needed (seasonal allergies).   Yes Historical Provider, MD  omeprazole (PRILOSEC) 20 MG capsule Take 20 mg by mouth daily.  07/11/12  Yes Historical Provider, MD  oxyCODONE-acetaminophen (PERCOCET) 10-325 MG per tablet Take 1 tablet by mouth every 6 (six) hours as needed for pain. 07/27/14  Yes Carmin Muskrat, MD  oxyCODONE-acetaminophen (PERCOCET/ROXICET) 5-325 MG per tablet Take 1 tablet by mouth every 4 (four) hours as needed for severe pain. 07/04/14  Yes Trula Slade, DPM  pramipexole (MIRAPEX) 0.25 MG tablet 1 tablet during the day, 3 tablets at night Patient taking differently: Take 0.25-0.75 mg by mouth 2 (two) times daily. Take 1 tablet (0.25 mg) every morning and 3 tablets (0.75 mg) every night - for restless legs 03/25/14  Yes Kathrynn Ducking, MD  QUEtiapine Fumarate (SEROQUEL XR) 150 MG 24 hr tablet Take 150 mg by mouth at bedtime.   Yes Historical Provider, MD    ROS:  Out of a complete 14 system review of symptoms, the patient complains only of the following symptoms, and all other reviewed systems are negative.  Decreased activity, fatigue Ringing in the ears Eyes itching Leg swelling, heart murmur Heat intolerance, excessive thirst, flushing Restless legs, daytime sleepiness, sleep talking Joint pain, back pain, achy muscles, muscle cramps Bruising easily Memory loss, dizziness, numbness  Blood pressure 141/97, pulse 90, height 5\' 4"  (1.626 m), weight 275 lb 9.6 oz (125.011 kg).   Blood pressure, standing, right arm is 164/100. Blood pressure, sitting, right arm is 150/96.  Physical Exam  General: The patient is alert and cooperative at the time of the examination. The patient is markedly obese.  Skin: 2+ edema is noted at the  ankles bilaterally.   Neurologic Exam  Mental status: The patient is alert and oriented x 3 at the time of the examination. The patient has apparent normal recent and remote memory, with an apparently normal attention span and concentration ability.   Cranial nerves: Facial symmetry is present. Speech is normal, no aphasia or dysarthria is noted. Extraocular movements are full. Visual fields are full.  Motor: The patient has good strength in all 4 extremities.  Sensory examination: Soft touch sensation is symmetric on the face, arms, and legs.  Coordination: The patient has good finger-nose-finger and heel-to-shin bilaterally.  Gait and station: The patient has a normal gait. Tandem gait is normal. Romberg is negative. No drift is seen.  Reflexes: Deep tendon reflexes are symmetric, but are  depressed.   Assessment/Plan:  1. Restless leg syndrome  2. Obesity  3. Excessive daytime drowsiness  The patient is markedly obese and she now reports new symptoms of fatigue, excessive daytime drowsiness, confusion upon awakening. The patient will need to be evaluated for possible sleep apnea. The patient is doing well with the restless leg syndrome, we will continue the Mirapex dosing for now. The patient will be referred for a sleep evaluation.  Jill Alexanders MD 07/31/2014 9:25 PM  [facility] [address] [place], [place] [address]  Phone [phone] Fax [phone]

## 2014-08-02 ENCOUNTER — Telehealth: Payer: Self-pay

## 2014-08-02 LAB — VITAMIN B12: Vitamin B-12: 323 pg/mL (ref 211–946)

## 2014-08-02 LAB — CARBAMAZEPINE LEVEL, TOTAL: Carbamazepine Lvl: 5.6 ug/mL (ref 4.0–12.0)

## 2014-08-02 LAB — RPR: RPR Ser Ql: NONREACTIVE

## 2014-08-02 LAB — COPPER, SERUM: Copper: 129 ug/dL (ref 72–166)

## 2014-08-02 LAB — B. BURGDORFI ANTIBODIES: Lyme IgG/IgM Ab: <0.91 {ISR} (ref 0.00–0.90)

## 2014-08-02 NOTE — Telephone Encounter (Signed)
Patient was informed that blood work results are unremarkable. Good carbamazepine level, no change in dosing per Dr. Jannifer Franklin.  She has no questions at this time

## 2014-08-04 NOTE — Progress Notes (Signed)
Patient ID: Rachel Luna, female   DOB: 03-23-1955, 59 y.o.   MRN: [id number]  Subjective: [name] presents the office today status post left tarsal exostectomy performed on 06/11/2014 with Dr. Paulla Dolly. She states that she continues to have discomfort on the top of her left foot overlying the site of the incision. Since last upon she did get the emergency room again for concerns of infection. She was discharged home. She does continue with the Santyl dressing changes daily. She states the area looks about the same as what it does prior. She has yet to follow-up with her primary care physician regards to elevated HbA1c. I also send a letter. She denies any systemic complaints as fevers, chills, nausea, vomiting. Denies any calf pain, chest pain, shortness of breath. No other complaints at this time.  Objective: AAO 3, NAD DP/PT pulses palpable, CRT less than 3 seconds Protective sensation intact with Simms Weinstein monofilament Incision along the dorsal aspect the left foot with partial dehiscence although it appears to be superficial and improved compared to last appointment. There is only a central opening measuring a proximal 0.4 x 0.1 cm and is very superficial granular wound base. The rest the incision has healed over considerably compared to last appointment. There is some pink tissue around the incision however there is no definitive erythema, ascending cellulitis, fluctuance, crepitus, drainage/purulence, malodor. There is mild tenderness to palpation overlying the incision site however does appear to be improved compared to last appointment. No other areas of tenderness to bilateral lower extremities. No other open lesions or pre-ulcerative lesions. No pain with calf compression, swelling, warmth, erythema.  Assessment: 59 year old female that is post left dorsal exostectomy with healing superficial surgical site dehiscence, currently no signs of infection.   Plan: -Treatment options were  discussed included alternatives, risks, complications. -Continue daily dressing changes with santyl. At today's appointment was given a schedule her to go back to surgery for wound debridement however there has been improvement in the wound compared last appointment so we will continue local wound care for now. -Monitor closely for any clinical signs or symptoms of infection and directed to call the office immediately should any occur or go to the ER. -Pain medication as needed. -Continue surgical shoe at all times. -Follow-up in 1 week or sooner if any problems are to arise. In the meantime call the office any questions or concerns or any changes symptoms.

## 2014-08-06 ENCOUNTER — Other Ambulatory Visit: Payer: Self-pay | Admitting: Certified Nurse Midwife

## 2014-08-06 ENCOUNTER — Ambulatory Visit (INDEPENDENT_AMBULATORY_CARE_PROVIDER_SITE_OTHER): Payer: Medicare Other | Admitting: Podiatry

## 2014-08-06 ENCOUNTER — Telehealth: Payer: Self-pay | Admitting: *Deleted

## 2014-08-06 ENCOUNTER — Encounter: Payer: Self-pay | Admitting: Podiatry

## 2014-08-06 DIAGNOSIS — N959 Unspecified menopausal and perimenopausal disorder: Secondary | ICD-10-CM

## 2014-08-06 DIAGNOSIS — T8131XD Disruption of external operation (surgical) wound, not elsewhere classified, subsequent encounter: Secondary | ICD-10-CM

## 2014-08-06 MED ORDER — HYDROCODONE-ACETAMINOPHEN 5-325 MG PO TABS: 1.0000 | ORAL_TABLET | Freq: Four times a day (QID) | ORAL | Status: DC | PRN

## 2014-08-06 MED ORDER — ESTRADIOL 1 MG PO TABS: 1.0000 mg | ORAL_TABLET | Freq: Every day | ORAL | Status: DC

## 2014-08-06 NOTE — Patient Instructions (Signed)
Remove soft cast  in 5 days or sooner if it is causing any problems, your toes turn colors, or any increased pain.

## 2014-08-06 NOTE — Progress Notes (Signed)
Patient ID: Rachel Luna, female   DOB: August 24, 1955, 59 y.o.   MRN: [id number]  Subjective: Rachel Luna presents the office today status post left tarsal exostectomy performed on 06/11/2014 with Dr. Paulla Dolly. She states that she continues to have discomfort on the top of her left foot overlying the site of the incision. She states that yesterday she did have some drainage coming from the area and the area was more red and she was to the emergency room however she fell asleep. She states that she has good days and bad days with the wound. She continues to apply Santyl to the wound daily followed by dry sterile dressing. She denies any systemic complaints as fevers, chills, nausea, vomiting. Denies any calf pain, chest pain, shortness of breath. No other complaints at this time.  Objective: AAO 3, NAD DP/PT pulses palpable, CRT less than 3 seconds Protective sensation intact with Simms Weinstein monofilament Incision along the dorsal aspect the left foot with partial dehiscence although it appears to be superficial and much improved compared to last appointment. The incision is almost healed completely except for a small portion on the proximal aspect of the incision which measures approximately 0.1 x 0.1 cm it is very superficial with a granular wound base. There is small amount of pink tissue around the incision with a small amount of edema however this appears to be improved as well. There is no surrounding erythema, ascending cellulitis, fluctuance, crepitus, malodor, drainage/purulence, or any other clinical signs of infection. No other areas of tenderness to bilateral lower extremities. No other open lesions or pre-ulcerative lesions. No pain with calf compression, swelling, warmth, erythema.  Assessment: 59 year old female that is post left dorsal exostectomy with healing superficial surgical site dehiscence, currently no signs of infection.   Plan: -Treatment options were discussed included  alternatives, risks, complications. -Continue daily dressing changes with santyl.  -Monitor closely for any clinical signs or symptoms of infection and directed to call the office immediately should any occur or go to the ER. -Pain medication as needed. -Continue surgical shoe at all times. -Unna boot applied. She was treated cut the bandage off in 5 days or sooner if any problems are to arise. Directed to cut the bandage off immediately to toes turn color, increased pain or there is concern for infection. -Follow-up in 1 week or sooner if any problems are to arise. In the meantime call the office any questions or concerns or any changes symptoms. Once incision closes completely, will transition to a regular shoe.

## 2014-08-06 NOTE — Telephone Encounter (Signed)
Fax from pharmacy- request refill of medication Estradiol 1 mg #30 1 po daily Annual exam- 04/2014

## 2014-08-06 NOTE — Telephone Encounter (Signed)
Refill, with 11 refills of estradiol sent to the pharmacy.  Thank you.

## 2014-08-13 ENCOUNTER — Encounter: Payer: Self-pay | Admitting: Podiatry

## 2014-08-13 ENCOUNTER — Ambulatory Visit (INDEPENDENT_AMBULATORY_CARE_PROVIDER_SITE_OTHER): Payer: Medicare Other | Admitting: Podiatry

## 2014-08-13 VITALS — BP 137/82 | HR 95 | Resp 16

## 2014-08-13 DIAGNOSIS — T8131XD Disruption of external operation (surgical) wound, not elsewhere classified, subsequent encounter: Secondary | ICD-10-CM

## 2014-08-13 NOTE — Progress Notes (Signed)
Patient ID: Rachel Luna, female   DOB: 1956-01-12, 59 y.o.   MRN: [id number]  Subjective: 59 year-old female presents the office today status post left tarsal exostectomy performed on 06/11/2014. I haven't seen her for a wound dehiscence. She states that she has been continuous Santyl dressing changes daily. She states her left couple days she feels that the wound is healed. She also states his his last appointment her pain is significantly decreased. She denies any surrounding redness or any red streaks. She also states that her swelling has decreased. She left the Unna boot on for approximate 4 days before taking her off. Denies any systemic complaints as fevers, chills, nausea, vomiting. Denies any calf pain, chest pain, shortness of breath. No other complaints at this time in no acute changes since last appointment.  Objective: AAO 3, NAD DP/PT pulses palpable, CRT less than 3 seconds  Protective sensation intact with Simms Weinstein monofilament  Incision/wound on the dorsal aspect the left foot is healed at this time. There is no open lesion identified. There is some pink skin around the incision which is likely new skin and there is no specific erythema. There is no ascending cellulitis, flexions, crepitus, malodor, drainage, or any other clinical signs of infection. There is mild edema directly around the incision however it has improved. At this time there is no tenderness to palpation overlying the incision. No other open lesions or pre-ulcerative lesions identified bilaterally. No other areas of tenderness to bilateral lower extremities. No other areas of edema, erythema, increased warmth. No pain with calf compression, swelling, warmth, erythema.   Assessment : 59 year old female status post left dorsal exostectomy resulted in a dehiscence which is now healed   Plan : At this time feels that the wound has healed. I did dispense an ankle  Compression stocking to help with the swelling.  Recommended the patient to continue to monitor for any recurrence and there is any to call the office immediately. She can start to transition back into a supportive shoe. Monitor for any signs or symptoms of infection directed to call the office if any are to occur. Call in 3 weeks or sooner if any problems are to arise. In the meantime call the office with any questions, concerns, change in symptoms.

## 2014-08-26 ENCOUNTER — Telehealth: Payer: Self-pay | Admitting: Neurology

## 2014-08-26 ENCOUNTER — Ambulatory Visit: Payer: Medicaid Other | Admitting: Adult Health

## 2014-08-26 MED ORDER — BACLOFEN 10 MG PO TABS: 10.0000 mg | ORAL_TABLET | Freq: Three times a day (TID) | ORAL | Status: DC

## 2014-08-26 NOTE — Telephone Encounter (Signed)
Patient is calling for a refill on Baclofen 10 mg to be sent to [facility] on [address]. Thanks!

## 2014-08-26 NOTE — Telephone Encounter (Signed)
Rx has been sent.  Receipt confirmed by pharmacy.  Originally prescribed at [facility] on 12/12.

## 2014-08-27 ENCOUNTER — Ambulatory Visit (INDEPENDENT_AMBULATORY_CARE_PROVIDER_SITE_OTHER): Payer: Medicare Other | Admitting: Podiatry

## 2014-08-27 ENCOUNTER — Ambulatory Visit (INDEPENDENT_AMBULATORY_CARE_PROVIDER_SITE_OTHER): Payer: Medicare Other

## 2014-08-27 ENCOUNTER — Other Ambulatory Visit: Payer: Self-pay

## 2014-08-27 VITALS — BP 117/82 | HR 66 | Resp 16

## 2014-08-27 DIAGNOSIS — M779 Enthesopathy, unspecified: Secondary | ICD-10-CM | POA: Diagnosis not present

## 2014-08-27 DIAGNOSIS — S9032XA Contusion of left foot, initial encounter: Secondary | ICD-10-CM

## 2014-08-27 DIAGNOSIS — Z1231 Encounter for screening mammogram for malignant neoplasm of breast: Secondary | ICD-10-CM

## 2014-08-27 MED ORDER — HYDROCODONE-ACETAMINOPHEN 5-325 MG PO TABS: 1.0000 | ORAL_TABLET | Freq: Four times a day (QID) | ORAL | Status: DC | PRN

## 2014-08-28 ENCOUNTER — Ambulatory Visit (INDEPENDENT_AMBULATORY_CARE_PROVIDER_SITE_OTHER): Payer: 59 | Admitting: Licensed Clinical Social Worker

## 2014-08-28 DIAGNOSIS — F411 Generalized anxiety disorder: Secondary | ICD-10-CM | POA: Diagnosis not present

## 2014-08-28 DIAGNOSIS — F3160 Bipolar disorder, current episode mixed, unspecified: Secondary | ICD-10-CM

## 2014-08-28 NOTE — Progress Notes (Signed)
Patient:  Rachel Luna   DOB: [birth year]  MR Number: [id number]  Location: [facility], [address], [place], [place] [address]  Start: 11:20 a.m. End: 12: 15 p.m.  Provider/Observer:     Rachel Luna, MSW, Rachel Luna   Reason For Service:     Outpatient Psychotherapy [phone]  Content of Session: Rachel Luna is a former client of Rachel [name], Rachel Luna who is no longer with this clinic.  She apologized for not making it to previous appointments and explained that she has foot surgery then some complications. "Life doesn't get better it just changes."  "I don't think I'll get over my mommy."  Client has seen a [facility]avement counselor [facility].  She will follow up with him after 4th of July.  She is still struggling with her left foot that she had surgery on in April to have a bone spur removed and as a result of not being put on an antibiotic she developed cellulitis.  "It's gone from bad to worse."   She fell two days ago due to instability in the foot.  Now on more pain medications.  "I haven't been able to plant my flowers or float in my pool."  Her wound has closed so she is looking forward to getting into her pool.  Rachel Luna talks to a friend in [place] and will visit [place] to sit with her dad, who is 57 years old, for about a week. Friend lives next door so she will get to see her.  Rachel Luna often visits over the week-end in [place], [place] yet voiced dread in terms of the traffic and number of people since her father lives in a busy section of the city.  She lives with a female roommate in a one bedroom trailer and Rachel Luna stays in the living room. She has been in this living situation for two years and is satisfied with it.  She enjoys her cats, both inside and outside. The roommate has been diagnosed with COPD and client worries about him.  Another friend at her Rachel Luna informed her that she is dying and client indicated that this has really increased  her anxiety.  Client makes $700 per month and talked about that Rachel was trying to help her to get on a budget.  "There's no budget to get on. You just pay the bills and there's nothing left."  She will re-apply for food stamps which will hopefully improve her financial situation.  Klonopin "calms me down."  She reports that she still has anger outbursts and informed Rachel Luna "I stay so sad anyways and it won't take me but a second and I'll sling something out of the door."  Client reports that this has been the situation for her for years. Rachel Luna talked about how floating in her pool calms her and is a place that she looks forward to going to because it does relax her.  Reported that over past two weeks her hands have been shaking more and she will address this at appointment next week with Rachel Luna here in this clinic.  She shared family history that includes her mother and brother both with Bi-Polar Disorder and client reports that she has been diagnosed with Bi-Polar Disorder for years.  Rachel Luna was uncertain what she wanted to focus on during therapy sessions and expressed to Rachel Luna that "It has been nice just talking to you and getting things out."  Participation Rachel:   Active  Participation  Quality:  Appropriate      Behavioral Observation:  Casual, Alert, and Appropriate.   Current Psychosocial Factors: Foot problems and concern about health of family & friends.   Current Symptoms:   Anxious symptoms, feeling sad, hypersomnia, hyper-vigilant and irritability; "I'm a nervous wreck all the time."  Patient Progress:   Unable to determine since this is our first visit.  Suicidal Ideation/Intention/Plan:         "My goal is to not hurt myself."  Denied SI   Therapist Response/Interventions:    Active and reflective listening as way to begin building trust and rapport with client.  Rachel Luna encouraged sharing feelings of depression in order to clarify them and gain insight as to causes.  Rachel Luna offered education about common irrational fears and beliefs that contribute to anxiety. Discussed briefly the importance of healthy and positive self-talk as a means of increasing client's capacity to handle anxiety more constructively. Emotional support and reassurance that client has positive interests that can help to improve her mood.  Discussed pros and cons of outpatient therapy and reinforced with client the importance of being open to change and setting goals to work towards with Rachel Luna.  Diagnosis:  BiPolar Disorder, MRE, Mixed, Moderate  Unspecified Anxiety   Plan:  Rachel Luna will continue building trust and rapport with client.  Client will return to OPT within two weeks.  Rachel Luna will address behavioral and emotional changes that client would like to focus on in sessions.  Rachel Luna will coordinate client's care with Psychiatrist, Rachel Luna, PRN.  Rachel Luna will take medications as prescribed and keep all appointments.  Rachel Luna will contact this clinic in between appointments PRN.

## 2014-08-29 ENCOUNTER — Institutional Professional Consult (permissible substitution): Payer: Medicare Other | Admitting: Neurology

## 2014-08-30 ENCOUNTER — Other Ambulatory Visit: Payer: Self-pay | Admitting: Neurology

## 2014-08-30 NOTE — Telephone Encounter (Signed)
I spoke with patient who said this drug was being prescribed by another provider.  Says she will contact PCP regarding this med, and asked that we disregard the request.  Says she will call us back if anything further is needed.

## 2014-09-02 NOTE — Progress Notes (Addendum)
Patient ID: Rachel Luna, female   DOB: 02-25-1956, 59 y.o.   MRN: [id number]  Subjective: 59 year-old female presents the office today for left foot pain after she sustained a fall yesterday. She states that she starts to have the stinging sensation that she did previously to the top of her left foot overlying the incision. She states that she has pain to the foot however when I ask her pointed. With the majority of her pain is that she points to the low foot will not isolate a specific area. She is able to ambulate although she does state she has some discomfort. She also states that she's had slight increase in swelling since yesterday. She denies any increase in redness. No other complaints at this time. Denies any systemic complaints as fevers, chills, nausea, vomiting.  Objective: AAO 3, NAD; patient presents crying, very anxious DP/PT pulses palpable, CRT less than 3 seconds Protective sensation intact with Simms once the monofilament Incisional the dorsal aspect of the left foot is healed at this time the scar overlying the area. There is a faint rim of erythema around the scar likely from inflammation as opposed infection   as it ismore pink in color. There is no ascending cellulitis, fluctuance, crepitus, drainage, malodor. There is mild discomfort along the course of the fifth metatarsal base and along the distal peroneal tendons proximal to the 5th metatarsal base. There is no pain with inversion/eversion of the peroneal tendons appear to be intact. There is mild edema overlying the area without any associated erythema or increase in warmth. There is no other areas of tenderness to bilateral lower extremities. No other areas of edema, erythema, increase in warmth. No other open lesions or pre-ulcerative lesions identified bilaterally. There is no pain with calf compression, swelling, warmth, erythema.  Assessment: 59 year old female left foot pain status post fall, likely  tendinitis  Plan: -X-rays were obtained and reviewed with the patient. There is a well corticated ossicle the base of the 5th metatarsal, which is likely old.  -Treatment options discussed including all alternatives, risks, and complications -Unna boot was applied. Patient has a patient remove the boot within 5 days or sooner if there is any increase in pain, swelling or any toe discoloration -She can continue the surgical shoe. -Ice to the area. -Dispensed 10 Vicodin.  -Follow-up 2-3 weeks or sooner if any problems arise. In the meantime, encouraged to call the office with any questions, concerns, change in symptoms.   Celesta Gentile, DPM

## 2014-09-03 ENCOUNTER — Ambulatory Visit (INDEPENDENT_AMBULATORY_CARE_PROVIDER_SITE_OTHER): Payer: Medicare Other | Admitting: Podiatry

## 2014-09-03 VITALS — BP 99/80 | HR 66 | Resp 16

## 2014-09-03 DIAGNOSIS — S9032XD Contusion of left foot, subsequent encounter: Secondary | ICD-10-CM

## 2014-09-04 ENCOUNTER — Encounter: Payer: Self-pay | Admitting: Psychiatry

## 2014-09-04 ENCOUNTER — Ambulatory Visit (INDEPENDENT_AMBULATORY_CARE_PROVIDER_SITE_OTHER): Payer: 59 | Admitting: Psychiatry

## 2014-09-04 VITALS — BP 142/88 | HR 94 | Temp 97.2°F | Ht 64.0 in | Wt 267.0 lb

## 2014-09-04 DIAGNOSIS — F331 Major depressive disorder, recurrent, moderate: Secondary | ICD-10-CM

## 2014-09-04 DIAGNOSIS — I10 Essential (primary) hypertension: Secondary | ICD-10-CM | POA: Insufficient documentation

## 2014-09-04 DIAGNOSIS — E559 Vitamin D deficiency, unspecified: Secondary | ICD-10-CM | POA: Insufficient documentation

## 2014-09-04 DIAGNOSIS — I152 Hypertension secondary to endocrine disorders: Secondary | ICD-10-CM | POA: Insufficient documentation

## 2014-09-04 MED ORDER — DULOXETINE HCL 60 MG PO CPEP: 120.0000 mg | ORAL_CAPSULE | Freq: Every day | ORAL | Status: DC

## 2014-09-04 MED ORDER — CLONAZEPAM 1 MG PO TABS: 1.0000 mg | ORAL_TABLET | Freq: Three times a day (TID) | ORAL | Status: DC | PRN

## 2014-09-04 NOTE — Progress Notes (Signed)
BH MD/PA/NP OP Progress Note  09/04/2014 9:32 AM Rachel Luna  MRN:  [id number]  Subjective:  Patient returns for follow-up of her major depressive disorder. She states that currently she has an sleeping a lot. I discussed with her that she is on multiple medications. She indicates she is no longer on the pain medications as her foot issue has somewhat resolved. She states that eventually the wound from her bunion surgery is healed. She related she broke her toe while getting out of her pool. She states that spin looked at by Dr. and they are just letting that he'll.  She states she really felt like a big difference with the Seroquel and it was added but now does not feel like it is helping as much. She feels the Klonopin helps her partly with her anxiety. She describes anxiety occurs more in situations such as driving and being in crowds in noisy situations. She does seem to have some contact with relatives in [place] and plans on going there for the Fourth of July.  Chief Complaint:  Visit Diagnosis:  No diagnosis found.  Past Medical History:  Past Medical History  Diagnosis Date  . Hypertension   . Depression   . Bipolar disorder   . Arthritis   . Hyperlipidemia   . GERD (gastroesophageal reflux disease)   . Gout   . Fibroids   . chronic low back pain   . Obesity   . Chronic low back pain 02/18/2014    Past Surgical History  Procedure Laterality Date  . Spine surgery    . Knee arthroplasty    . Cholecystectomy    . Ovarian cyst removal    . Abdominal hysterectomy    . Foot surgery Left    Family History:  Family History  Problem Relation Age of Onset  . Diabetes Mother   . Cancer Mother     breast  . Heart disease Mother   . Mental illness Mother     bipolar disorder  . Stroke Mother   . Diabetes Father   . Hypertension Father   . Diabetes Brother   . Bipolar disorder Brother   . Diabetes Brother    Social History:  History   Social History  . Marital  Status: Divorced    Spouse Name: N/A  . Number of Children: 0  . Years of Education: 10   Occupational History  .     Social History Main Topics  . Smoking status: Never Smoker   . Smokeless tobacco: Never Used  . Alcohol Use: No  . Drug Use: No  . Sexual Activity: Not Currently   Other Topics Concern  . None   Social History Narrative   Patient is divorced and lives with a roommate.   Patient is on disability.   Patient has a 10th grade education.   Patient is right-handed.   Patient drinks 4-5 sodas daily.            Additional History:  Assessment:   Musculoskeletal: Strength & Muscle Tone: within normal limits Gait & Station: What slow but patient has her left foot/toe healing. Patient leans: N/A  Psychiatric Specialty Exam: HPI  Review of Systems  Psychiatric/Behavioral: Negative for suicidal ideas, hallucinations, memory loss and substance abuse. The patient is nervous/anxious. The patient does not have insomnia.     Blood pressure 142/88, pulse 94, temperature 97.2 F (36.2 C), temperature source Tympanic, height 5\' 4"  (1.626 m), weight 267 lb (121.11 kg),  SpO2 91 %.Body mass index is 45.81 kg/(m^2).  General Appearance: Well Groomed  Eye Contact:  Good  Speech:  Normal Rate  Volume:  Normal  Mood:  Okay  Affect:  Patient was able to laugh and smile  Thought Process:  Linear and Logical  Orientation:  Full (Time, Place, and Person)  Thought Content:  Negative  Suicidal Thoughts:  No  Homicidal Thoughts:  No  Memory:  Immediate;   Good Recent;   Good Remote;   Good  Judgement:  Good  Insight:  Good  Psychomotor Activity:  Negative  Concentration:  Good  Recall:  Good  Fund of Knowledge: Good  Language: Good  Akathisia:  Negative  Handed:  Right unknown   AIMS (if indicated):  Not done  Assets:  Communication Skills Desire for Improvement Social Support  ADL's:  Intact  Cognition: WNL  Sleep:  excesssive   Is the patient at risk to self?   No. Has the patient been a risk to self in the past 6 months?  No. Has the patient been a risk to self within the distant past?  No. Is the patient a risk to others?  No. Has the patient been a risk to others in the past 6 months?  No. Has the patient been a risk to others within the distant past?  No.  Current Medications: Current Outpatient Prescriptions  Medication Sig Dispense Refill  . amitriptyline (ELAVIL) 25 MG tablet Take 25 mg by mouth at bedtime.     . baclofen (LIORESAL) 10 MG tablet Take 1 tablet (10 mg total) by mouth 3 (three) times daily. 90 each 6  . carbamazepine (TEGRETOL) 200 MG tablet take 1 and 1/2 tablet by mouth twice a day 90 tablet 6  . Cholecalciferol (VITAMIN D3) 2000 UNITS TABS Take 2,000 mg by mouth daily.    . clonazePAM (KLONOPIN) 1 MG tablet Take 1 tablet (1 mg total) by mouth 3 (three) times daily as needed for anxiety. 90 tablet 1  . doxycycline (VIBRA-TABS) 100 MG tablet Take 1 tablet (100 mg total) by mouth 2 (two) times daily. (Patient taking differently: Take 100 mg by mouth 2 (two) times daily. 10 day course started 07/23/14) 20 tablet 0  . DULoxetine (CYMBALTA) 60 MG capsule Take 2 capsules (120 mg total) by mouth daily. 60 capsule 2  . estradiol (ESTRACE) 1 MG tablet Take 1 tablet (1 mg total) by mouth daily. 30 tablet 12  . fluticasone (FLONASE) 50 MCG/ACT nasal spray Place 2 sprays into both nostrils daily as needed (seasonal allergies).     . gabapentin (NEURONTIN) 800 MG tablet take 1 tablet by mouth four times a day 120 tablet 6  . losartan (COZAAR) 25 MG tablet Take 50 mg by mouth daily.     . meloxicam (MOBIC) 15 MG tablet Take 15 mg by mouth daily.     . Olopatadine HCl 0.2 % SOLN Place 1 drop into both eyes daily as needed (seasonal allergies).    Marland Kitchen omeprazole (PRILOSEC) 20 MG capsule Take 20 mg by mouth daily.     . pramipexole (MIRAPEX) 0.25 MG tablet 1 tablet during the day, 3 tablets at night (Patient taking differently: Take 0.25-0.75 mg  by mouth 2 (two) times daily. Take 1 tablet (0.25 mg) every morning and 3 tablets (0.75 mg) every night - for restless legs) 120 tablet 5  . QUEtiapine Fumarate (SEROQUEL XR) 150 MG 24 hr tablet Take 150 mg by mouth at bedtime.    Marland Kitchen  collagenase (SANTYL) ointment Apply 1 application topically daily. (Patient not taking: Reported on 08/28/2014) 15 g 0  . fenofibrate (TRICOR) 145 MG tablet Take 145 mg by mouth daily.     Marland Kitchen HYDROcodone-acetaminophen (NORCO/VICODIN) 5-325 MG per tablet Take 1 tablet by mouth every 6 (six) hours as needed for moderate pain. (Patient not taking: Reported on 09/04/2014) 10 tablet 0  . oxyCODONE-acetaminophen (PERCOCET) 10-325 MG per tablet Take 1 tablet by mouth every 6 (six) hours as needed for pain. (Patient not taking: Reported on 08/28/2014) 15 tablet 0  . oxyCODONE-acetaminophen (PERCOCET/ROXICET) 5-325 MG per tablet Take 1 tablet by mouth every 4 (four) hours as needed for severe pain. (Patient not taking: Reported on 09/04/2014) 30 tablet 0   No current facility-administered medications for this visit.    Medical Decision Making:  Established Problem, Worsening (2)  Treatment Plan Summary:Medication management we will increase the patient's Klonopin to 1 mg 3 times a day as needed. We will continue her Cymbalta 120 mg daily. We will taper off the Seroquel XR. I've given her a sample pack and instructed take 300 mg for 11 days, 200 mg for 1 day and then 50 mg for 3 days. She will come back in 1 month. We will assess whether to augment with perhaps Abilify or rexulti. Patient states she's had Wellbutrin which caused her to have suicidal thoughts.   Interval hard copy prescription for Klonopin, #90 with one refill. Have also ordered her Cymbalta 60 mg, #60 with 2 refills.    Faith Rogue 09/04/2014, 9:32 AM

## 2014-09-04 NOTE — Progress Notes (Signed)
Patient ID: [name], female   DOB: 03-23-1955, 59 y.o.   MRN: [id number]  Subjective : 59 year old female presents the office today for follow up evaluation of left foot pain. She states that the last appointment she is doing well and she has no pain to her foot. She denies any tingling or numbness or any sharp pains to her feet. She states that she has no pain along the surgical site or along the incision. She has no pain to the outside aspect of her foot that she did last appointment. She The Unna boot on for couple days before she moved it. He does state that the other day she was floating in her pool she fell out of it and in her left second toe. She says the area was bruised over the bruising is resolving dispensed with slight redness over the area. No other complaints at this time and no other acute changes since last appointment.   Objective : AAO 3, NAD DP/PT pulses palpable, CRT less than 3 seconds Protective sensation intact with Simms Weinstein monofilament Incisional the dorsal aspect of left foot as well coapted without any evidence of dehiscence and the scar is formed. There is no surrounding erythema, ascending cellulitis, fluctuance, crepitus, drainage, malodor. There are no areas of tenderness to bilateral lower extremities. There is no overlying edema, erythema, increase in warmth. She is able to walk without any discomfort. No open lesions or pre-ulcer lesions identified bilaterally. There is no pain with calf compression, swelling, warmth, erythema.  Assessment: 59 year old female with healed incision left foot with resolved foot pain.  Plan: -Treatment options discussed including all alternatives, risks, and complications -Recommended repeat x-rays the left foot due to the left second toe injury. She does not want any x-rays performed at this time. Continue to monitor area. At the second toe becomes more symptomatic of there is any problems to call the office. -Continue  with regular shoe gear as tolerated. -Follow-up as needed. There is any changes in her symptoms or any recurrence to call the office. In the meantime I instructed her to call the office with questions, concerns, change in symptoms.  Celesta Gentile, DPM

## 2014-09-08 ENCOUNTER — Other Ambulatory Visit: Payer: Self-pay | Admitting: Neurology

## 2014-09-12 ENCOUNTER — Telehealth: Payer: Self-pay | Admitting: *Deleted

## 2014-09-12 NOTE — Telephone Encounter (Signed)
[facility] states pt has multiple narcotic rx from multiple doctors that overlap therapy.

## 2014-09-12 NOTE — Telephone Encounter (Signed)
This is a fyi for you dr Designer, industrial/product

## 2014-09-24 ENCOUNTER — Institutional Professional Consult (permissible substitution): Payer: Medicare Other | Admitting: Neurology

## 2014-09-30 ENCOUNTER — Ambulatory Visit: Payer: PRIVATE HEALTH INSURANCE

## 2014-09-30 ENCOUNTER — Other Ambulatory Visit: Payer: Self-pay

## 2014-09-30 DIAGNOSIS — G8929 Other chronic pain: Secondary | ICD-10-CM

## 2014-09-30 DIAGNOSIS — M545 Low back pain: Principal | ICD-10-CM

## 2014-09-30 MED ORDER — PRAMIPEXOLE DIHYDROCHLORIDE 0.25 MG PO TABS: ORAL_TABLET | ORAL | Status: DC

## 2014-10-04 ENCOUNTER — Ambulatory Visit: Payer: 59 | Admitting: Licensed Clinical Social Worker

## 2014-10-04 ENCOUNTER — Ambulatory Visit: Payer: Self-pay | Admitting: Psychiatry

## 2014-10-18 ENCOUNTER — Encounter: Payer: Self-pay | Admitting: Psychiatry

## 2014-10-18 ENCOUNTER — Ambulatory Visit (INDEPENDENT_AMBULATORY_CARE_PROVIDER_SITE_OTHER): Payer: 59 | Admitting: Psychiatry

## 2014-10-18 VITALS — BP 144/92 | HR 84 | Temp 97.7°F | Ht 64.0 in | Wt 257.4 lb

## 2014-10-18 DIAGNOSIS — F331 Major depressive disorder, recurrent, moderate: Secondary | ICD-10-CM | POA: Diagnosis not present

## 2014-10-18 DIAGNOSIS — Z634 Disappearance and death of family member: Secondary | ICD-10-CM

## 2014-10-18 MED ORDER — CLONAZEPAM 1 MG PO TABS: 1.0000 mg | ORAL_TABLET | Freq: Four times a day (QID) | ORAL | Status: DC | PRN

## 2014-10-18 MED ORDER — ARIPIPRAZOLE 5 MG PO TABS: 5.0000 mg | ORAL_TABLET | Freq: Every day | ORAL | Status: DC

## 2014-10-18 MED ORDER — DULOXETINE HCL 60 MG PO CPEP: 120.0000 mg | ORAL_CAPSULE | Freq: Every day | ORAL | Status: DC

## 2014-10-18 NOTE — Progress Notes (Signed)
BH MD/PA/NP OP Progress Note  10/18/2014 12:19 PM Rachel Luna  MRN:  [id number]  Subjective:  Patient returns for follow-up of her major depressive disorder. The main stressor in her life is that her 57 year old brother died of a heart attack on 2014/10/27. She states she's been experiencing anger and depression since that event. She did state that when she came off the Seroquel at the after the last visit that her daytime sleepiness and sluggishness improved. She states right now she has difficulty concentrating and just feels that she is going through the motions. She denies any suicidal ideation citing she wants to live for her other brother and her father.  She states that she feels Klonopin helps her a little bit but that she may have been becoming immune to it. We discussed at the last visit augmenting her Cymbalta with another agent. We decided to proceed with that at this time and thus we will start some Abilify 5 mg daily. We discussed the grieving process and she is aware of those emotions.  Chief Complaint:  Chief Complaint    Follow-up; Anxiety; Depression; Panic Attack; Stress; Fatigue     Visit Diagnosis:  No diagnosis found.  Past Medical History:  Past Medical History  Diagnosis Date  . Hypertension   . Depression   . Bipolar disorder   . Arthritis   . Hyperlipidemia   . GERD (gastroesophageal reflux disease)   . Gout   . Fibroids   . chronic low back pain   . Obesity   . Chronic low back pain 02/18/2014    Past Surgical History  Procedure Laterality Date  . Spine surgery    . Knee arthroplasty    . Cholecystectomy    . Ovarian cyst removal    . Abdominal hysterectomy    . Foot surgery Left    Family History:  Family History  Problem Relation Age of Onset  . Diabetes Mother   . Cancer Mother     breast  . Heart disease Mother   . Mental illness Mother     bipolar disorder  . Stroke Mother   . Diabetes Father   . Hypertension Father   . Diabetes  Brother   . Bipolar disorder Brother   . Diabetes Brother    Social History:  Social History   Social History  . Marital Status: Divorced    Spouse Name: N/A  . Number of Children: 0  . Years of Education: 10   Occupational History  .     Social History Main Topics  . Smoking status: Never Smoker   . Smokeless tobacco: Never Used  . Alcohol Use: No  . Drug Use: No  . Sexual Activity: Not Currently   Other Topics Concern  . None   Social History Narrative   Patient is divorced and lives with a roommate.   Patient is on disability.   Patient has a 10th grade education.   Patient is right-handed.   Patient drinks 4-5 sodas daily.            Additional History:  Assessment:   Musculoskeletal: Strength & Muscle Tone: within normal limits Gait & Station: What slow but patient has her left foot/toe healing. Patient leans: N/A  Psychiatric Specialty Exam: Anxiety Symptoms include nervous/anxious behavior. Patient reports no insomnia or suicidal ideas.    Depression        Associated symptoms include does not have insomnia and no suicidal ideas.  Past medical  history includes anxiety.     Review of Systems  Psychiatric/Behavioral: Positive for depression. Negative for suicidal ideas, hallucinations, memory loss and substance abuse. The patient is nervous/anxious. The patient does not have insomnia.     Blood pressure 144/92, pulse 84, temperature 97.7 F (36.5 C), temperature source Tympanic, height 5\' 4"  (1.626 m), weight 257 lb 6.4 oz (116.756 kg), SpO2 93 %.Body mass index is 44.16 kg/(m^2).  General Appearance: Well Groomed  Eye Contact:  Good  Speech:  Normal Rate  Volume:  Normal  Mood:  not doing  Affect:  Tearful in appointment and discussing her brother, but able to joke and laugh at the reception area when she was checking out   Thought Process:  Linear and Logical  Orientation:  Full (Time, Place, and Person)  Thought Content:  Negative  Suicidal  Thoughts:  No  Homicidal Thoughts:  No  Memory:  Immediate;   Good Recent;   Good Remote;   Good  Judgement:  Good  Insight:  Good  Psychomotor Activity:  Negative  Concentration:  Good  Recall:  Good  Fund of Knowledge: Good  Language: Good  Akathisia:  Negative  Handed:  Right unknown   AIMS (if indicated):  Done today, normal  Assets:  Communication Skills Desire for Improvement Social Support  ADL's:  Intact  Cognition: WNL  Sleep:  excesssive   Is the patient at risk to self?  No. Has the patient been a risk to self in the past 6 months?  No. Has the patient been a risk to self within the distant past?  No. Is the patient a risk to others?  No. Has the patient been a risk to others in the past 6 months?  No. Has the patient been a risk to others within the distant past?  No.  Current Medications: Current Outpatient Prescriptions  Medication Sig Dispense Refill  . amitriptyline (ELAVIL) 25 MG tablet Take 25 mg by mouth at bedtime.     . baclofen (LIORESAL) 10 MG tablet Take 1 tablet (10 mg total) by mouth 3 (three) times daily. 90 each 6  . carbamazepine (TEGRETOL) 200 MG tablet take 1 and 1/2 tablets by mouth twice a day 90 tablet 6  . Cholecalciferol (VITAMIN D3) 2000 UNITS TABS Take 2,000 mg by mouth daily.    . clonazePAM (KLONOPIN) 1 MG tablet Take 1 tablet (1 mg total) by mouth 4 (four) times daily as needed for anxiety. 120 tablet 1  . DULoxetine (CYMBALTA) 60 MG capsule Take 2 capsules (120 mg total) by mouth daily. 60 capsule 2  . estradiol (ESTRACE) 1 MG tablet Take 1 tablet (1 mg total) by mouth daily. 30 tablet 12  . fenofibrate (TRICOR) 145 MG tablet Take 145 mg by mouth daily.     . fluticasone (FLONASE) 50 MCG/ACT nasal spray Place 2 sprays into both nostrils daily as needed (seasonal allergies).     . gabapentin (NEURONTIN) 800 MG tablet take 1 tablet by mouth four times a day 120 tablet 6  . HYDROcodone-acetaminophen (NORCO/VICODIN) 5-325 MG per tablet  Take 1 tablet by mouth every 6 (six) hours as needed for moderate pain. 10 tablet 0  . losartan (COZAAR) 25 MG tablet Take 50 mg by mouth daily.     . meloxicam (MOBIC) 15 MG tablet Take 15 mg by mouth daily.     . Olopatadine HCl 0.2 % SOLN Place 1 drop into both eyes daily as needed (seasonal allergies).    Marland Kitchen  omeprazole (PRILOSEC) 20 MG capsule Take 20 mg by mouth daily.     . pramipexole (MIRAPEX) 0.25 MG tablet 1 tablet during the day, 3 tablets at night 120 tablet 1  . QUEtiapine Fumarate (SEROQUEL XR) 150 MG 24 hr tablet Take 150 mg by mouth at bedtime.    . ARIPiprazole (ABILIFY) 5 MG tablet Take 1 tablet (5 mg total) by mouth daily. 30 tablet 1  . collagenase (SANTYL) ointment Apply 1 application topically daily. (Patient not taking: Reported on 08/28/2014) 15 g 0  . doxycycline (VIBRA-TABS) 100 MG tablet Take 1 tablet (100 mg total) by mouth 2 (two) times daily. (Patient not taking: Reported on 10/18/2014) 20 tablet 0  . oxyCODONE-acetaminophen (PERCOCET) 10-325 MG per tablet Take 1 tablet by mouth every 6 (six) hours as needed for pain. (Patient not taking: Reported on 08/28/2014) 15 tablet 0  . oxyCODONE-acetaminophen (PERCOCET/ROXICET) 5-325 MG per tablet Take 1 tablet by mouth every 4 (four) hours as needed for severe pain. (Patient not taking: Reported on 09/04/2014) 30 tablet 0   No current facility-administered medications for this visit.    Medical Decision Making:  Established Problem, Worsening (2)  Treatment Plan Summary:Medication management we will increase the patient's Klonopin to 1 mg 4 times a day. We will continue her Cymbalta 120 mg daily. We'll begin Abilify 5 mg in the morning to augment for depression and help with mood stability. Risk and benefits of been discussing patient's able to consent. We discussed metabolic labs and she states that she is going to have those done this month at her primary care and she will ask that those results be forwarded to this  Probation officer.   Faith Rogue 10/18/2014, 12:19 PM

## 2014-10-31 ENCOUNTER — Encounter: Payer: Self-pay | Admitting: Emergency Medicine

## 2014-10-31 ENCOUNTER — Emergency Department
Admission: EM | Admit: 2014-10-31 | Discharge: 2014-10-31 | Disposition: A | Discharge status: Home / Self Care | Payer: Medicare Other | Attending: Emergency Medicine | Admitting: Emergency Medicine

## 2014-10-31 ENCOUNTER — Emergency Department: Payer: Medicare Other

## 2014-10-31 DIAGNOSIS — F419 Anxiety disorder, unspecified: Secondary | ICD-10-CM | POA: Insufficient documentation

## 2014-10-31 DIAGNOSIS — S8001XA Contusion of right knee, initial encounter: Secondary | ICD-10-CM | POA: Diagnosis not present

## 2014-10-31 DIAGNOSIS — W1839XA Other fall on same level, initial encounter: Secondary | ICD-10-CM | POA: Insufficient documentation

## 2014-10-31 DIAGNOSIS — S8991XA Unspecified injury of right lower leg, initial encounter: Secondary | ICD-10-CM | POA: Diagnosis present

## 2014-10-31 DIAGNOSIS — Z79899 Other long term (current) drug therapy: Secondary | ICD-10-CM | POA: Diagnosis not present

## 2014-10-31 DIAGNOSIS — Y9389 Activity, other specified: Secondary | ICD-10-CM | POA: Insufficient documentation

## 2014-10-31 DIAGNOSIS — I10 Essential (primary) hypertension: Secondary | ICD-10-CM | POA: Diagnosis not present

## 2014-10-31 DIAGNOSIS — Y998 Other external cause status: Secondary | ICD-10-CM | POA: Diagnosis not present

## 2014-10-31 DIAGNOSIS — R4789 Other speech disturbances: Secondary | ICD-10-CM | POA: Diagnosis not present

## 2014-10-31 DIAGNOSIS — Z791 Long term (current) use of non-steroidal anti-inflammatories (NSAID): Secondary | ICD-10-CM | POA: Insufficient documentation

## 2014-10-31 DIAGNOSIS — Y9289 Other specified places as the place of occurrence of the external cause: Secondary | ICD-10-CM | POA: Diagnosis not present

## 2014-10-31 DIAGNOSIS — Z7951 Long term (current) use of inhaled steroids: Secondary | ICD-10-CM | POA: Insufficient documentation

## 2014-10-31 DIAGNOSIS — Z79818 Long term (current) use of other agents affecting estrogen receptors and estrogen levels: Secondary | ICD-10-CM | POA: Insufficient documentation

## 2014-10-31 MED ORDER — HYDROCODONE-ACETAMINOPHEN 5-325 MG PO TABS
2.0000 | ORAL_TABLET | Freq: Once | ORAL | Status: AC
  Administered 2014-10-31: 2 via ORAL

## 2014-10-31 MED ORDER — HYDROCODONE-ACETAMINOPHEN 5-325 MG PO TABS
ORAL_TABLET | ORAL | Status: AC
  Administered 2014-10-31: 2 via ORAL
  Filled 2014-10-31: qty 2

## 2014-10-31 MED ORDER — MELOXICAM 15 MG PO TABS: 15.0000 mg | ORAL_TABLET | Freq: Every day | ORAL | Status: DC

## 2014-10-31 NOTE — ED Provider Notes (Signed)
[facility] Emergency Department Provider Note  ____________________________________________  Time seen: Approximately 8:39 PM  I have reviewed the triage vital signs and the nursing notes.   HISTORY  Chief Complaint Fall    HPI SHAVAUN OSTERLOH is a 59 y.o. female with multiple chronic psychiatric issues as well as chronic medical problems who presents with pain in her right knee after a fall 2 days ago.  She reports being under a lot of stress due to recent deaths in her family and she has been sleeping in a different place.  She is disoriented when she wakes up at night and states that she has fallen a couple of times in the last few days (she mention this to the triage nurse; she only told me about one fall).  She did not lose consciousness and did not strike any of her other extremities or her head.She describes the pain as severe in the right knee, constant, and aching.  She is ambulatory with a limp; she walked into the emergency department   Past Medical History  Diagnosis Date  . Hypertension   . Depression   . Bipolar disorder   . Arthritis   . Hyperlipidemia   . GERD (gastroesophageal reflux disease)   . Gout   . Fibroids   . chronic low back pain   . Obesity   . Chronic low back pain 02/18/2014    Patient Active Problem List   Diagnosis Date Noted  . BP (high blood pressure) 09/04/2014  . Avitaminosis D 09/04/2014  . Dizzy 07/31/2014  . Chronic low back pain 02/18/2014  . Pain in joint, lower leg 08/10/2013  . Acid reflux 03/12/2013  . Restless legs syndrome (RLS) 02/16/2013  . Disturbance of skin sensation 02/16/2013  . Menopausal hot flushes 08/14/2012  . Arthritis of knee, degenerative 04/06/2011  . Restless leg 11/11/2010  . Benign essential HTN 07/13/2010  . Elevated fasting blood sugar 07/13/2010  . Cannot sleep 07/13/2010  . Chronic pain 01/08/2010  . Affective disorder, major 11/05/2009  . Allergic rhinitis 07/03/2009     Past Surgical History  Procedure Laterality Date  . Spine surgery    . Knee arthroplasty    . Cholecystectomy    . Ovarian cyst removal    . Abdominal hysterectomy    . Foot surgery Left     Current Outpatient Rx  Name  Route  Sig  Dispense  Refill  . amitriptyline (ELAVIL) 25 MG tablet   Oral   Take 25 mg by mouth at bedtime.          . ARIPiprazole (ABILIFY) 5 MG tablet   Oral   Take 1 tablet (5 mg total) by mouth daily.   30 tablet   1   . baclofen (LIORESAL) 10 MG tablet   Oral   Take 1 tablet (10 mg total) by mouth 3 (three) times daily.   90 each   6   . carbamazepine (TEGRETOL) 200 MG tablet      take 1 and 1/2 tablets by mouth twice a day   90 tablet   6   . Cholecalciferol (VITAMIN D3) 2000 UNITS TABS   Oral   Take 2,000 mg by mouth daily.         . clonazePAM (KLONOPIN) 1 MG tablet   Oral   Take 1 tablet (1 mg total) by mouth 4 (four) times daily as needed for anxiety.   120 tablet   1   .  collagenase (SANTYL) ointment   Topical   Apply 1 application topically daily. Patient not taking: Reported on 08/28/2014   15 g   0   . doxycycline (VIBRA-TABS) 100 MG tablet   Oral   Take 1 tablet (100 mg total) by mouth 2 (two) times daily. Patient not taking: Reported on 10/18/2014   20 tablet   0   . DULoxetine (CYMBALTA) 60 MG capsule   Oral   Take 2 capsules (120 mg total) by mouth daily.   60 capsule   2   . estradiol (ESTRACE) 1 MG tablet   Oral   Take 1 tablet (1 mg total) by mouth daily.   30 tablet   12   . fenofibrate (TRICOR) 145 MG tablet   Oral   Take 145 mg by mouth daily.          . fluticasone (FLONASE) 50 MCG/ACT nasal spray   Each Nare   Place 2 sprays into both nostrils daily as needed (seasonal allergies).          . gabapentin (NEURONTIN) 800 MG tablet      take 1 tablet by mouth four times a day   120 tablet   6   . HYDROcodone-acetaminophen (NORCO/VICODIN) 5-325 MG per tablet   Oral   Take 1 tablet  by mouth every 6 (six) hours as needed for moderate pain.   10 tablet   0   . losartan (COZAAR) 25 MG tablet   Oral   Take 50 mg by mouth daily.          . meloxicam (MOBIC) 15 MG tablet   Oral   Take 1 tablet (15 mg total) by mouth daily.   30 tablet   0   . Olopatadine HCl 0.2 % SOLN   Both Eyes   Place 1 drop into both eyes daily as needed (seasonal allergies).         Marland Kitchen omeprazole (PRILOSEC) 20 MG capsule   Oral   Take 20 mg by mouth daily.          Marland Kitchen oxyCODONE-acetaminophen (PERCOCET) 10-325 MG per tablet   Oral   Take 1 tablet by mouth every 6 (six) hours as needed for pain. Patient not taking: Reported on 08/28/2014   15 tablet   0   . oxyCODONE-acetaminophen (PERCOCET/ROXICET) 5-325 MG per tablet   Oral   Take 1 tablet by mouth every 4 (four) hours as needed for severe pain. Patient not taking: Reported on 09/04/2014   30 tablet   0   . pramipexole (MIRAPEX) 0.25 MG tablet      1 tablet during the day, 3 tablets at night   120 tablet   1   . QUEtiapine Fumarate (SEROQUEL XR) 150 MG 24 hr tablet   Oral   Take 150 mg by mouth at bedtime.           Allergies Lisinopril; Wellbutrin; and Neupro  Family History  Problem Relation Age of Onset  . Diabetes Mother   . Cancer Mother     breast  . Heart disease Mother   . Mental illness Mother     bipolar disorder  . Stroke Mother   . Diabetes Father   . Hypertension Father   . Diabetes Brother   . Bipolar disorder Brother   . Diabetes Brother     Social History Social History  Substance Use Topics  . Smoking status: Never Smoker   . Smokeless tobacco:  Never Used  . Alcohol Use: No    Review of Systems Constitutional: No fever/chills Eyes: No visual changes. ENT: No sore throat. Cardiovascular: Denies chest pain. Respiratory: Denies shortness of breath. Gastrointestinal: No abdominal pain.  No nausea, no vomiting.  No diarrhea.  No constipation. Genitourinary: Negative for  dysuria. Musculoskeletal: Negative for back pain.  Pain in her right knee Skin: Negative for rash. Neurological: Negative for headaches, focal weakness or numbness.  10-point ROS otherwise negative.  ____________________________________________   PHYSICAL EXAM:  VITAL SIGNS: ED Triage Vitals  Enc Vitals Group     BP 10/31/14 2014 128/88 mmHg     Pulse Rate 10/31/14 2014 88     Resp 10/31/14 2014 20     Temp 10/31/14 2014 98.4 F (36.9 C)     Temp Source 10/31/14 2014 Oral     SpO2 10/31/14 2014 96 %     Weight 10/31/14 2014 260 lb (117.935 kg)     Height 10/31/14 2014 5\' 4"  (1.626 m)     Head Cir --      Peak Flow --      Pain Score 10/31/14 2015 9     Pain Loc --      Pain Edu? --      Excl. in [place]? --     Constitutional: Alert and oriented.  Tearful, anxious Eyes: Conjunctivae are normal. PERRL. EOMI. Head: Atraumatic. Nose: No congestion/rhinnorhea. Mouth/Throat: Mucous membranes are moist.  Oropharynx non-erythematous. Neck: No stridor.  No cervical spine tenderness to palpation. Cardiovascular: Normal rate, regular rhythm. Grossly normal heart sounds.  Good peripheral circulation. Respiratory: Normal respiratory effort.  No retractions. Lungs CTAB. Gastrointestinal: Obese, Soft and nontender. No distention. No abdominal bruits. No CVA tenderness. Musculoskeletal: No joint effusions.  Ecchymosis to the right lateral knee and just below the knee.  No significant swelling, no deformity, patient does have pain with range of motion but is able to bear weight. Neurologic:  Normal speech and language. No gross focal neurologic deficits are appreciated.  Skin:  Skin is warm, dry and intact. No rash noted. Psychiatric: Tearful, anxious, labile emotions.  Pressured speech.  ____________________________________________   LABS (all labs ordered are listed, but only abnormal results are displayed)  Not indicated ____________________________________________  EKG  Not  indicated ____________________________________________  RADIOLOGY   Dg Knee 2 Views Right  10/31/2014   CLINICAL DATA:  Status post fall on Tuesday with right knee pain.  EXAM: RIGHT KNEE - 1-2 VIEW  COMPARISON:  None.  FINDINGS: There is no evidence of fracture, dislocation, or joint effusion. There are degenerative joint changes of right knee with narrowed joint space and osteophyte formation. Soft tissues are unremarkable.  IMPRESSION: No acute fracture or dislocation.   Electronically Signed   By: Abelardo Diesel M.D.   On: 10/31/2014 20:47    ____________________________________________   PROCEDURES  Procedure(s) performed: None  Critical Care performed: No ____________________________________________   INITIAL IMPRESSION / ASSESSMENT AND PLAN / ED COURSE  Pertinent labs & imaging results that were available during my care of the patient were reviewed by me and considered in my medical decision making (see chart for details).  I reviewed the patient's record on the [place] controlled substance database.  In addition to her regular clonazepam that she gets monthly, she has had 10 prescriptions for narcotics filled in the last 6 months.  These seem to be from the same for providers, mostly her podiatrists and dentist.  However, I am not comfortable  prescribing narcotics for her given the frequent prescriptions, the use of benzodiazepines as well, the patient's unusual affect and psychiatric history, her recent social stressors, and the lack of significant physical exam and radiologic findings.  I provided reassurance to the patient and encouraged weight-bearing as tolerated, Ace wrap, the use of crutches if necessary (she refused the crutches), RICE therapy, and outpatient follow up.  I gave her 1 dose of Norco in the emergency department and recommended outpatient use of over-the-counter medications.  She understands.  ____________________________________________  FINAL  CLINICAL IMPRESSION(S) / ED DIAGNOSES  Final diagnoses:  Knee contusion, right, initial encounter      NEW MEDICATIONS STARTED DURING THIS VISIT:  Discharge Medication List as of 10/31/2014  9:23 PM       Hinda Kehr, MD 10/31/14 2303

## 2014-10-31 NOTE — ED Notes (Signed)
Pt returned from X-ray to room 32

## 2014-10-31 NOTE — ED Notes (Signed)
Pt. Presents to triage in wheelchair.  Pt. States she fell Tuesday night landing on her rt. Knee.  Pt. States decreased ability to apply weight to rt. Knee.

## 2014-10-31 NOTE — ED Notes (Signed)
Pt reports that her brother recently died and she has been at her fathers house trying to take care of him.  Pt then returns home for a week or so. Pt reports per her roommate that she seems to get up in the middle of the night and not seem to know where she is and has fallen a couple of times in the last couple of days. Pt reports that she fell the first time on Tuesday 8/23 night, and then again on 8/24. Pt reports that the fall on on 8/23 was the worst of the 2 falls. Pt reports not being able to bear any weight on the right leg. Pt denies LOC or hurting anything other than her right leg. Unknown what she tripped or fell on. Pt reports that the left leg is okay except some residual pain to her left foot from bone spur removal surgery 1 month ago.

## 2014-10-31 NOTE — Discharge Instructions (Signed)
Contusion °A contusion is a deep bruise. Contusions happen when an injury causes bleeding under the skin. Signs of bruising include pain, puffiness (swelling), and discolored skin. The contusion may turn blue, purple, or yellow. °HOME CARE  °· Put ice on the injured area. °¨ Put ice in a plastic bag. °¨ Place a towel between your skin and the bag. °¨ Leave the ice on for 15-20 minutes, 03-04 times a day. °· Only take medicine as told by your doctor. °· Rest the injured area. °· If possible, raise (elevate) the injured area to lessen puffiness. °GET HELP RIGHT AWAY IF:  °· You have more bruising or puffiness. °· You have pain that is getting worse. °· Your puffiness or pain is not helped by medicine. °MAKE SURE YOU:  °· Understand these instructions. °· Will watch your condition. °· Will get help right away if you are not doing well or get worse. °Document Released: 08/11/2007 Document Revised: 05/17/2011 Document Reviewed: 12/28/2010 °ExitCare® Patient Information ©2015 [employer]. This information is not intended to replace advice given to you by your health care provider. Make sure you discuss any questions you have with your health care provider. ° °

## 2014-11-04 ENCOUNTER — Ambulatory Visit (INDEPENDENT_AMBULATORY_CARE_PROVIDER_SITE_OTHER): Payer: 59 | Admitting: Licensed Clinical Social Worker

## 2014-11-04 DIAGNOSIS — F3160 Bipolar disorder, current episode mixed, unspecified: Secondary | ICD-10-CM | POA: Diagnosis not present

## 2014-11-04 DIAGNOSIS — Z634 Disappearance and death of family member: Secondary | ICD-10-CM

## 2014-11-04 NOTE — Progress Notes (Signed)
THERAPIST PROGRESS NOTE  Session Time: 1:05 p.m. - 2:00 p.m.  Participation Level: Active  Behavioral Response: CasualAlertAngry, Anxious and Depressed  Type of Therapy: Individual Therapy  Treatment Goals addressed: Coping  Interventions: Supportive  Summary: Rachel Luna is a 59 y.o. female who [facility] to [facility].  Client was appropriately tearful on and off throughout the session as she talked about her youngest brother , Elta Guadeloupe, unexpected death at age 61 of massive heart attack on October 01, 2014.  She shared various pictures of him and her father who at age 57 is mobile and in fair health.  Rachel Luna has been staying between her home with roommate and [place] with her father and sister in [place]. "I just go from place to place." Client has been seeing a bereavement counselor at [facility] locally.  "I didn't get to say good-bye." She had not seen her brother for a few months. "I'm not dealing with it well."  Sister in law, [place], asked client to began going with her to grief support groups and Marlowe Kays agreed.  She discussed she and brother's relationship and how she believes since her mother is also deceased and she is the oldest and the daughter that it is her job "I've got to keep myself together for everybody."  She spoke with appropriate humor of some places that she and sister in law will take client's father who worked closely with the brother before he retired.  Client and her other brother with plans to take their father to [place] later in the year.  Along with increased crying spells, feeling "spaced out and numb" and with decrease in focus, Rachel Luna also reported that she has not been sleeping well the past several nights and tearfully reported "I see Elta Guadeloupe whenever I close my eyes."  Appropriate grief reactions voiced including reports that both she and sister in law have both "broken things out of anger."  Other than two recent falls due to client feeling disoriented or "out of sorts" she is  not involved in SIB or with SI. Her speech was a bit slowed during session yet coherent and thoughts were goal oriented.  She was receptive to information provided today on grief and the grief journey.  Suicidal/Homicidal: Negativewithout intent/plan  Therapist Response/Interventions:    Sympathy and empathic listening along with emotional support were provided to client today to continue building trust and rapport. LCSW encouraged sharing of feelings of grief and depression in order to begin the healing process. Validated emotions and current symptoms as part of bereavement experience.  LCSW offered education about stages of grief and importance of allowing herself to feel the emotions she is having.  Discussed briefly the importance of self care and of making healthy choices like proper rest, surrounding self with caring and positive people and talking through her feelings about her losses. Offered client hand-outs that focus on stages of grief, tasks of grief and common emotional experiences of both depression and grief.  Encouraged calls between sessions PRN.  Diagnosis:  Bearevement  BiPolar Disorder, MRE, Depressed  Unspecified Anxiety  Plan:  LCSW will continue building trust and rapport with client.  [facility] will return to [facility] on 11/19/14, and LCSW will address behavioral and emotional changes that client would like to focus on in sessions.  LCSW will coordinate client's care with Psychiatrist, Dr. Jimmye Norman, PRN.  Rachel Luna will take medications as prescribed and keep all appointments.  Rachel Luna will contact this clinic in between appointments PRN and continue working with  grief counselor and attending bereavement support groups PRN.     Miguel Dibble, LCSW 11/04/2014

## 2014-11-06 ENCOUNTER — Telehealth: Payer: Self-pay | Admitting: *Deleted

## 2014-11-06 NOTE — Telephone Encounter (Signed)
Pt states she had foot surgery and it was fine, but then stepped in a hole, now has pain, went to the ER and they told her to go to her podiatrist.  I told pt to rest, ice and elevate and to call for an appt on 11/07/2014, pt agreed.

## 2014-11-07 ENCOUNTER — Encounter: Payer: Self-pay | Admitting: Podiatry

## 2014-11-07 ENCOUNTER — Ambulatory Visit (INDEPENDENT_AMBULATORY_CARE_PROVIDER_SITE_OTHER): Payer: Medicare Other

## 2014-11-07 ENCOUNTER — Ambulatory Visit (INDEPENDENT_AMBULATORY_CARE_PROVIDER_SITE_OTHER): Payer: Medicare Other | Admitting: Podiatry

## 2014-11-07 VITALS — BP 141/90 | HR 95 | Resp 18

## 2014-11-07 DIAGNOSIS — S9032XA Contusion of left foot, initial encounter: Secondary | ICD-10-CM

## 2014-11-07 DIAGNOSIS — L03115 Cellulitis of right lower limb: Secondary | ICD-10-CM | POA: Diagnosis not present

## 2014-11-07 DIAGNOSIS — R52 Pain, unspecified: Secondary | ICD-10-CM

## 2014-11-07 DIAGNOSIS — M79604 Pain in right leg: Secondary | ICD-10-CM | POA: Diagnosis not present

## 2014-11-07 MED ORDER — HYDROCODONE-ACETAMINOPHEN 5-325 MG PO TABS: 1.0000 | ORAL_TABLET | Freq: Four times a day (QID) | ORAL | Status: DC | PRN

## 2014-11-07 MED ORDER — DOXYCYCLINE HYCLATE 100 MG PO TABS: 100.0000 mg | ORAL_TABLET | Freq: Two times a day (BID) | ORAL | Status: DC

## 2014-11-07 NOTE — Progress Notes (Signed)
   Subjective:    Patient ID: Rachel Luna, female    DOB: [birth year], 59 y.o.   MRN: [id number]  HPI  59 year old female presents the office today with concerns of bilateral foot pain. She states that over the last couple of issues had 2 falls. She states she fell a hole in her left foot and she also had a fall injuring her right foot on a separate incident. She do the emergency room at that time. She states that her right leg is also become swollen somewhat red and is tender to touch. She states that she has 2 spots on her leg and she is not sure if she had been bit by a bug, or what is going on. She does state that the left foot pain has greatly improved. She denies any systemic complaints as fevers, chills, nausea, vomiting. No other complaints at this time.  Review of Systems  All other systems reviewed and are negative.      Objective:   Physical Exam AAO x3, NAD DP/PT pulses palpable bilaterally, CRT less than 3 seconds Protective sensation intact with Simms Weinstein monofilament There is tenderness palpation on the left foot overlying the third and fourth metatarsals distally. There is no pain with MPJ range of motion no tenderness of the toes. There is no other areas of tenderness the left foot. There is no overlying edema, erythema, increased warmth left lower extremity. No pain with calf compression to the left side. On the right foot there is tenderness over the digits 3, 4, 5. There is no pain with MPJ range of motion. There is no pain along the metatarsals or other areas of the foot/ankle. To the right leg there are 2 annular areas which appear to be almost bug bites. There is erythema to the right leg and there is edema and pain with calf compression. No areas of flexions or crepitus. There is no drainage or purulence. No other areas of tenderness to bilateral lower extremities No other open lesions or pre-ulcerative lesions.  No overlying edema, erythema, increase in warmth to  bilateral lower extremities.  No pain with calf compression, swelling, warmth, erythema bilaterally.      Assessment & Plan:  59 year old female with bilateral foot pain, right leg swelling/erythema -X-rays were obtained and reviewed with the patient.  -Treatment options discussed including all alternatives, risks, and complications -Left foot appears to be resolving. Likely contusion. No definitive evidence of acute fractures x-ray. As symptoms are improving we'll continue to monitor. -On the right leg will order venous duplex today to rule out DVT. Also start doxycycline in case of infection/MRSA. Discussed impression however will await the results of the duplex. -Monitor for any clinical signs or symptoms of infection and directed to call the office immediately should any occur or go to the ER. -Follow-up 1 week or sooner if any problems arise. In the meantime, encouraged to call the office with any questions, concerns, change in symptoms.   Celesta Gentile, DPM

## 2014-11-19 ENCOUNTER — Encounter: Payer: Self-pay | Admitting: Psychiatry

## 2014-11-19 ENCOUNTER — Ambulatory Visit: Payer: Self-pay | Admitting: Licensed Clinical Social Worker

## 2014-11-19 ENCOUNTER — Ambulatory Visit (INDEPENDENT_AMBULATORY_CARE_PROVIDER_SITE_OTHER): Payer: 59 | Admitting: Psychiatry

## 2014-11-19 VITALS — BP 132/80 | HR 86 | Temp 97.2°F | Ht 64.0 in | Wt 260.2 lb

## 2014-11-19 DIAGNOSIS — F411 Generalized anxiety disorder: Secondary | ICD-10-CM | POA: Diagnosis not present

## 2014-11-19 DIAGNOSIS — F331 Major depressive disorder, recurrent, moderate: Secondary | ICD-10-CM | POA: Diagnosis not present

## 2014-11-19 MED ORDER — TRAZODONE HCL 50 MG PO TABS: ORAL_TABLET | ORAL | Status: DC

## 2014-11-19 NOTE — Progress Notes (Signed)
[facility] MD/PA/NP OP Progress Note  11/19/2014 9:53 AM Rachel Luna  MRN:  [id number]  Subjective:  Patient returns for follow-up of her major depressive disorder. The main stressor in her life is that her 57 year old brother died of a heart attack on 18-Oct-2014. She states that the Abilify that was added at the last visit 1 month ago has been helpful. She states she's been more up and about and been able to follow through with tasks. She states she has episodes where she cries when she thinks about her brother but otherwise she feels like her mood is been pretty good given the circumstances. She states her only issue right now is she's had difficulty sleeping she'll sleep for one hour and then wake up and she states this is been going on for the past month.  She relates her appetite is good. She states she's been able to focus on things such as cleaning out her brother's garage/business. She states that they've been doing this consistently for several days. She rates her energy is good.  Chief Complaint:  Chief Complaint    Follow-up; Medication Refill; Depression; Stress     Visit Diagnosis:  No diagnosis found.  Past Medical History:  Past Medical History  Diagnosis Date  . Hypertension   . Depression   . Bipolar disorder   . Arthritis   . Hyperlipidemia   . GERD (gastroesophageal reflux disease)   . Gout   . Fibroids   . chronic low back pain   . Obesity   . Chronic low back pain 02/18/2014    Past Surgical History  Procedure Laterality Date  . Spine surgery    . Knee arthroplasty    . Cholecystectomy    . Ovarian cyst removal    . Abdominal hysterectomy    . Foot surgery Left    Family History:  Family History  Problem Relation Age of Onset  . Diabetes Mother   . Cancer Mother     breast  . Heart disease Mother   . Mental illness Mother     bipolar disorder  . Stroke Mother   . Diabetes Father   . Hypertension Father   . Diabetes Brother   . Bipolar disorder  Brother   . Diabetes Brother    Social History:  Social History   Social History  . Marital Status: Divorced    Spouse Name: N/A  . Number of Children: 0  . Years of Education: 10   Occupational History  .     Social History Main Topics  . Smoking status: Never Smoker   . Smokeless tobacco: Never Used  . Alcohol Use: No  . Drug Use: No  . Sexual Activity: Not Currently   Other Topics Concern  . None   Social History Narrative   Patient is divorced and lives with a roommate.   Patient is on disability.   Patient has a 10th grade education.   Patient is right-handed.   Patient drinks 4-5 sodas daily.            Additional History:  Assessment:   Musculoskeletal: Strength & Muscle Tone: within normal limits Gait & Station: What slow but patient has her left foot/toe healing. Patient leans: N/A  Psychiatric Specialty Exam: Depression        Associated symptoms include does not have insomnia and no suicidal ideas.  Past medical history includes anxiety.   Anxiety Patient reports no insomnia, nervous/anxious behavior or suicidal ideas.  Review of Systems  Psychiatric/Behavioral: Positive for depression (States she does has periods where she gets down when she thinks about her brother but otherwise feels her mood is been stable). Negative for suicidal ideas, hallucinations, memory loss and substance abuse. The patient is not nervous/anxious and does not have insomnia.     Blood pressure 132/80, pulse 86, temperature 97.2 F (36.2 C), temperature source Tympanic, height 5\' 4"  (1.626 m), weight 260 lb 3.2 oz (118.026 kg), SpO2 92 %.Body mass index is 44.64 kg/(m^2).  General Appearance: Well Groomed  Eye Contact:  Good  Speech:  Normal Rate  Volume:  Normal  Mood:  All right  Affect:  Tearful in appointment and discussing her brother, but able to joke and laugh at the reception area when she was checking out   Thought Process:  Linear and Logical   Orientation:  Full (Time, Place, and Person)  Thought Content:  Negative  Suicidal Thoughts:  No  Homicidal Thoughts:  No  Memory:  Immediate;   Good Recent;   Good Remote;   Good  Judgement:  Good  Insight:  Good  Psychomotor Activity:  Negative  Concentration:  Good  Recall:  Good  Fund of Knowledge: Good  Language: Good  Akathisia:  Negative  Handed:  Right unknown   AIMS (if indicated):  Done today, normal  Assets:  Communication Skills Desire for Improvement Social Support  ADL's:  Intact  Cognition: WNL  Sleep:  poor   Is the patient at risk to self?  No. Has the patient been a risk to self in the past 6 months?  No. Has the patient been a risk to self within the distant past?  No. Is the patient a risk to others?  No. Has the patient been a risk to others in the past 6 months?  No. Has the patient been a risk to others within the distant past?  No.  Current Medications: Current Outpatient Prescriptions  Medication Sig Dispense Refill  . ARIPiprazole (ABILIFY) 5 MG tablet Take 1 tablet (5 mg total) by mouth daily. 30 tablet 1  . baclofen (LIORESAL) 10 MG tablet Take 1 tablet (10 mg total) by mouth 3 (three) times daily. 90 each 6  . carbamazepine (TEGRETOL) 200 MG tablet take 1 and 1/2 tablets by mouth twice a day 90 tablet 6  . Cholecalciferol (VITAMIN D3) 2000 UNITS TABS Take 2,000 mg by mouth daily.    . clonazePAM (KLONOPIN) 1 MG tablet Take 1 tablet (1 mg total) by mouth 4 (four) times daily as needed for anxiety. 120 tablet 1  . CRESTOR 20 MG tablet     . diptheria-tetanus toxoids [facility]) 2-2 LF/0.5ML injection     . DULoxetine (CYMBALTA) 60 MG capsule Take 2 capsules (120 mg total) by mouth daily. 60 capsule 2  . estradiol (ESTRACE) 1 MG tablet Take 1 tablet (1 mg total) by mouth daily. 30 tablet 12  . fenofibrate (TRICOR) 145 MG tablet Take 145 mg by mouth daily.     . fluticasone (FLONASE) 50 MCG/ACT nasal spray Place 2 sprays into both nostrils daily as  needed (seasonal allergies).     . gabapentin (NEURONTIN) 800 MG tablet take 1 tablet by mouth four times a day 120 tablet 6  . losartan (COZAAR) 50 MG tablet     . meloxicam (MOBIC) 15 MG tablet Take 1 tablet (15 mg total) by mouth daily. 30 tablet 0  . Olopatadine HCl 0.2 % SOLN Place 1 drop into  both eyes daily as needed (seasonal allergies).    Marland Kitchen omeprazole (PRILOSEC) 20 MG capsule Take 20 mg by mouth daily.     Marland Kitchen PAZEO 0.7 % SOLN     . pramipexole (MIRAPEX) 0.25 MG tablet 1 tablet during the day, 3 tablets at night 120 tablet 1  . collagenase (SANTYL) ointment Apply 1 application topically daily. (Patient not taking: Reported on 11/19/2014) 15 g 0  . doxycycline (VIBRA-TABS) 100 MG tablet Take 1 tablet (100 mg total) by mouth 2 (two) times daily. (Patient not taking: Reported on 11/19/2014) 20 tablet 0  . doxycycline (VIBRA-TABS) 100 MG tablet Take 1 tablet (100 mg total) by mouth 2 (two) times daily. (Patient not taking: Reported on 11/19/2014) 20 tablet 0  . FLUARIX QUADRIVALENT 0.5 ML injection     . HYDROcodone-acetaminophen (NORCO/VICODIN) 5-325 MG per tablet Take 1 tablet by mouth every 6 (six) hours as needed for moderate pain. (Patient not taking: Reported on 11/19/2014) 10 tablet 0  . losartan (COZAAR) 25 MG tablet Take 50 mg by mouth daily.     Marland Kitchen oxyCODONE-acetaminophen (PERCOCET) 10-325 MG per tablet Take 1 tablet by mouth every 6 (six) hours as needed for pain. (Patient not taking: Reported on 11/19/2014) 15 tablet 0  . oxyCODONE-acetaminophen (PERCOCET/ROXICET) 5-325 MG per tablet Take 1 tablet by mouth every 4 (four) hours as needed for severe pain. (Patient not taking: Reported on 11/19/2014) 30 tablet 0  . traZODone (DESYREL) 50 MG tablet Take one to two tablets at bedtime as needed for sleep. 60 tablet 1   No current facility-administered medications for this visit.    Medical Decision Making:  Established Problem, Worsening (2)  Treatment Plan Summary:Medication management we  will increase the patient's Klonopin to 1 mg 4 times a day. We will continue her Cymbalta 120 mg daily. Continue Abilify 5 mg in the morning to augment for depression. She has a laboratory slip to get metabolic labs drawn she is going to her primary care to have labs done and will have these done there. I've given her another lab slip. We will start some trazodone 50 to 100 millions of bedtime as needed for insomnia. Patient follow up in 1 month. Risk and benefits trazodone been discussed patient's able to consent. I told her to discontinue the amitriptyline that she was given by her primary care physician for sleep.  Faith Rogue 11/19/2014, 9:53 AM

## 2014-11-21 ENCOUNTER — Ambulatory Visit: Payer: Medicare Other | Admitting: Podiatry

## 2014-11-22 ENCOUNTER — Other Ambulatory Visit: Payer: Self-pay | Admitting: Neurology

## 2014-12-02 ENCOUNTER — Ambulatory Visit: Payer: Medicare Other

## 2014-12-04 ENCOUNTER — Ambulatory Visit (INDEPENDENT_AMBULATORY_CARE_PROVIDER_SITE_OTHER): Payer: Medicare Other | Admitting: Neurology

## 2014-12-04 ENCOUNTER — Encounter: Payer: Self-pay | Admitting: Neurology

## 2014-12-04 VITALS — BP 140/90 | HR 95 | Ht 64.0 in | Wt 255.5 lb

## 2014-12-04 DIAGNOSIS — G8929 Other chronic pain: Secondary | ICD-10-CM | POA: Diagnosis not present

## 2014-12-04 DIAGNOSIS — M545 Low back pain, unspecified: Secondary | ICD-10-CM

## 2014-12-04 DIAGNOSIS — G2581 Restless legs syndrome: Secondary | ICD-10-CM | POA: Diagnosis not present

## 2014-12-04 MED ORDER — GABAPENTIN 800 MG PO TABS: 800.0000 mg | ORAL_TABLET | Freq: Four times a day (QID) | ORAL | Status: DC

## 2014-12-04 MED ORDER — PRAMIPEXOLE DIHYDROCHLORIDE 0.25 MG PO TABS: ORAL_TABLET | ORAL | Status: DC

## 2014-12-04 NOTE — Progress Notes (Signed)
Reason for visit: Restless leg syndrome  Rachel Luna is an 59 y.o. female  History of present illness:  Rachel Luna is a 59 year old right-handed white female with a history of restless leg syndrome, and a history of significant depression. The patient has done relatively well with the restless legs, when last seen she was indicating that she was having a lot of excessive daytime drowsiness. A sleep evaluation was set up, but this was never done. The patient indicates that she lost her brother suddenly, and she has been grieving for him. The patient was taken off of Seroquel and placed on Abilify. She indicates that the daytime drowsiness has improved with this medication change, and the use of Abilify has not worsened the restless leg syndrome. The patient is also on gabapentin and baclofen. She has some ongoing left foot pain following foot surgery in April 2016. The patient returns for an evaluation.  Past Medical History  Diagnosis Date  . Hypertension   . Depression   . Bipolar disorder   . Arthritis   . Hyperlipidemia   . GERD (gastroesophageal reflux disease)   . Gout   . Fibroids   . chronic low back pain   . Obesity   . Chronic low back pain 02/18/2014    Past Surgical History  Procedure Laterality Date  . Spine surgery    . Knee arthroplasty    . Cholecystectomy    . Ovarian cyst removal    . Abdominal hysterectomy    . Foot surgery Left     Family History  Problem Relation Age of Onset  . Diabetes Mother   . Cancer Mother     breast  . Heart disease Mother   . Mental illness Mother     bipolar disorder  . Stroke Mother   . Diabetes Father   . Hypertension Father   . Diabetes Brother   . Bipolar disorder Brother   . Diabetes Brother     Social history:  reports that she has never smoked. She has never used smokeless tobacco. She reports that she does not drink alcohol or use illicit drugs.    Allergies  Allergen Reactions  . Lisinopril Cough  .  Wellbutrin [Bupropion] Other (See Comments)    Irritability and Agitation  . Neupro [Rotigotine] Rash    Medications:  Prior to Admission medications   Medication Sig Start Date End Date Taking? Authorizing Provider  amitriptyline (ELAVIL) 25 MG tablet Take 25 mg by mouth at bedtime. 11/30/14  Yes Historical Provider, MD  ARIPiprazole (ABILIFY) 5 MG tablet Take 1 tablet (5 mg total) by mouth daily. 10/18/14  Yes Marjie Skiff, MD  baclofen (LIORESAL) 10 MG tablet Take 1 tablet (10 mg total) by mouth 3 (three) times daily. 08/26/14  Yes Kathrynn Ducking, MD  carbamazepine (TEGRETOL) 200 MG tablet take 1 and 1/2 tablets by mouth twice a day 09/08/14  Yes Kathrynn Ducking, MD  Cholecalciferol (VITAMIN D3) 2000 UNITS TABS Take 2,000 mg by mouth daily.   Yes Historical Provider, MD  clonazePAM (KLONOPIN) 1 MG tablet Take 1 tablet (1 mg total) by mouth 4 (four) times daily as needed for anxiety. 10/18/14  Yes Marjie Skiff, MD  CRESTOR 20 MG tablet  10/23/14  Yes Historical Provider, MD  diptheria-tetanus toxoids [facility]) 2-2 LF/0.5ML injection  11/02/14  Yes Historical Provider, MD  doxycycline (VIBRA-TABS) 100 MG tablet Take 1 tablet (100 mg total) by mouth 2 (two) times daily.  11/07/14  Yes Trula Slade, DPM  DULoxetine (CYMBALTA) 60 MG capsule Take 2 capsules (120 mg total) by mouth daily. 10/18/14  Yes Marjie Skiff, MD  estradiol (ESTRACE) 1 MG tablet Take 1 tablet (1 mg total) by mouth daily. 08/06/14  Yes Rachelle A Denney, CNM  FLUARIX QUADRIVALENT 0.5 ML injection  11/02/14  Yes Historical Provider, MD  fluticasone (FLONASE) 50 MCG/ACT nasal spray Place 2 sprays into both nostrils daily as needed (seasonal allergies).  08/08/12  Yes Historical Provider, MD  gabapentin (NEURONTIN) 800 MG tablet take 1 tablet by mouth four times a day 04/15/14  Yes Kathrynn Ducking, MD  losartan-hydrochlorothiazide (HYZAAR) 50-12.5 MG tablet Take 1 tablet by mouth daily. 11/30/14  Yes Historical Provider, MD    Olopatadine HCl 0.2 % SOLN Place 1 drop into both eyes daily as needed (seasonal allergies).   Yes Historical Provider, MD  omeprazole (PRILOSEC) 20 MG capsule Take 20 mg by mouth daily.  07/11/12  Yes Historical Provider, MD  PAZEO 0.7 % SOLN  08/23/14  Yes Historical Provider, MD  pramipexole (MIRAPEX) 0.25 MG tablet take 1 tablet by mouth DURING THE DAY and 3 tablets AT NIGHT 11/22/14  Yes Kathrynn Ducking, MD  traZODone (DESYREL) 50 MG tablet Take one to two tablets at bedtime as needed for sleep. 11/19/14  Yes Marjie Skiff, MD    ROS:  Out of a complete 14 system review of symptoms, the patient complains only of the following symptoms, and all other reviewed systems are negative.  Decreased activity Cough Leg swelling Heat intolerance Restless legs, insomnia, frequent waking, sleep talking Joint pain, joint swelling, back pain  Bruising easily Agitation, depression, anxiety, hyperactivity  Blood pressure 140/90, pulse 95, height 5\' 4"  (1.626 m), weight 255 lb 8 oz (115.894 kg).  Physical Exam  General: The patient is alert and cooperative at the time of the examination. The patient is markedly obese.  Skin: Trace edema of ankles is seen bilaterally.   Neurologic Exam  Mental status: The patient is alert and oriented x 3 at the time of the examination. The patient has apparent normal recent and remote memory, with an apparently normal attention span and concentration ability.   Cranial nerves: Facial symmetry is present. Speech is normal, no aphasia or dysarthria is noted. Extraocular movements are full. Visual fields are full.  Motor: The patient has good strength in all 4 extremities.  Sensory examination: Soft touch sensation is symmetric on the face, arms, and legs.  Coordination: The patient has good finger-nose-finger and heel-to-shin bilaterally.  Gait and station: The patient has a normal gait. Tandem gait is slightly unsteady. Romberg is negative. No drift is  seen.  Reflexes: Deep tendon reflexes are symmetric.   Assessment/Plan:  1. Restless leg syndrome  2. Obesity  3. Anxiety and depression  The patient appears to be doing relatively well at this time, refills were given for the gabapentin and for the Mirapex. The patient will follow-up in 6 months, sooner if needed. She will contact us if the excessive daytime drowsiness recurs.  Jill Alexanders MD 12/04/2014 7:57 PM  [facility] [address] [place], [place] [address]  Phone [phone] Fax [phone]

## 2014-12-04 NOTE — Patient Instructions (Signed)
Restless Legs Syndrome Restless legs syndrome is a movement disorder. It may also be called a sensorimotor disorder.  CAUSES  No one knows what specifically causes restless legs syndrome, but it tends to run in families. It is also more common in people with low iron, in pregnancy, in people who need dialysis, and those with nerve damage (neuropathy).Some medications may make restless legs syndrome worse.Those medications include drugs to treat high blood pressure, some heart conditions, nausea, colds, allergies, and depression. SYMPTOMS Symptoms include uncomfortable sensations in the legs. These leg sensations are worse during periods of inactivity or rest. They are also worse while sitting or lying down. Individuals that have the disorder describe sensations in the legs that feel like:  Pulling.  Drawing.  Crawling.  Worming.  Boring.  Tingling.  Pins and needles.  Prickling.  Pain. The sensations are usually accompanied by an overwhelming urge to move the legs. Sudden muscle jerks may also occur. Movement provides temporary relief from the discomfort. In rare cases, the arms may also be affected. Symptoms may interfere with going to sleep (sleep onset insomnia). Restless legs syndrome may also be related to periodic limb movement disorder (PLMD). PLMD is another more common motor disorder. It also causes interrupted sleep. The symptoms from PLMD usually occur most often when you are awake. TREATMENT  Treatment for restless legs syndrome is symptomatic. This means that the symptoms are treated.   Massage and cold compresses may provide temporary relief.  Walk, stretch, or take a cold or hot bath.  Get regular exercise and a good night's sleep.  Avoid caffeine, alcohol, nicotine, and medications that can make it worse.  Do activities that provide mental stimulation like discussions, needlework, and video games. These may be helpful if you are not able to walk or stretch. Some  medications are effective in relieving the symptoms. However, many of these medications have side effects. Ask your caregiver about medications that may help your symptoms. Correcting iron deficiency may improve symptoms for some patients. Document Released: 02/12/2002 Document Revised: 07/09/2013 Document Reviewed: 05/21/2010 Exit[employer] Patient Information 2015 [employer]. This information is not intended to replace advice given to you by your health care provider. Make sure you discuss any questions you have with your health care provider.  

## 2014-12-11 ENCOUNTER — Other Ambulatory Visit: Payer: Self-pay | Admitting: Psychiatry

## 2014-12-19 ENCOUNTER — Ambulatory Visit (INDEPENDENT_AMBULATORY_CARE_PROVIDER_SITE_OTHER): Payer: 59 | Admitting: Psychiatry

## 2014-12-19 ENCOUNTER — Ambulatory Visit (INDEPENDENT_AMBULATORY_CARE_PROVIDER_SITE_OTHER): Payer: 59 | Admitting: Licensed Clinical Social Worker

## 2014-12-19 ENCOUNTER — Encounter: Payer: Self-pay | Admitting: Psychiatry

## 2014-12-19 VITALS — BP 132/82 | HR 95 | Temp 97.6°F | Ht 64.0 in | Wt 258.6 lb

## 2014-12-19 DIAGNOSIS — F411 Generalized anxiety disorder: Secondary | ICD-10-CM

## 2014-12-19 DIAGNOSIS — F331 Major depressive disorder, recurrent, moderate: Secondary | ICD-10-CM | POA: Diagnosis not present

## 2014-12-19 DIAGNOSIS — F3160 Bipolar disorder, current episode mixed, unspecified: Secondary | ICD-10-CM

## 2014-12-19 DIAGNOSIS — Z634 Disappearance and death of family member: Secondary | ICD-10-CM

## 2014-12-19 MED ORDER — CLONAZEPAM 1 MG PO TABS: 1.0000 mg | ORAL_TABLET | Freq: Four times a day (QID) | ORAL | Status: DC | PRN

## 2014-12-19 NOTE — Progress Notes (Signed)
THERAPIST PROGRESS NOTE  Session Time: 11:15 a.m. - 12:15 p.m.  Participation Level: Active  Behavioral Response: CasualAlertAnxious and Depressed  Type of Therapy: Individual Therapy  Treatment Goals addressed: Coping  Interventions: Solution Focused, Strength-based, Supportive and Family Systems  Summary: Rachel Luna is a 59 y.o. female who presents with improvement in her depressive & anxious symptoms. Rachel Luna is upbeat, casually groomed, speech is loud and mood visibly and reportedly less depressed.  Client continues to spend time with her brother, Rachel Luna who lives next door to her father who lives in [place].  Found out last night that a life long friend of her's died yet stated "We were expecting it because he had cancer. "I've cried until I've cried out." "I'm going to throw a kick ass Halloween Party."  Her brother who died really liked Halloween and she will start decorating after getting through a yard-sale this week-end to get rid of some of his tools and personal items to help the Luna out financially.   In terms of her symptoms and mood, Anner told LCSW: "Ya'll have really helped me, I'm really feeling a lot better."  She has been able to look through pictures of her brother with less emotional distress and is making a collage for the upcoming [place] party.  Living arrangement is going fine and she and roommate get along.  Rachel Luna's sense of humor has returned and she shared with LCSW various activities that she has enjoyed since our last visit such as attending a Engineer, materials. She is proud of a space in the mobile home that she shares with her roommate and talked about how she has created this space on a budget.  No new worries or concerns voiced and client was very appreciative of therapy services provided.  She voiced appropriate disappointment about departure of LCSW from this practice and wished LCSW well.  Rachel Luna accepted the letter notifying her  of other options for OPT and will make one additional follow up appointment with LCSW prior to final day.  At this time Rachel Luna will notify LCSW about her decision about ongoing OPT.  Suicidal/Homicidal: Negativewithout intent/plan  Therapist Response/Interventions:    Assessed client's functional status and explored with her how she thought she was coping with her grief.  Empathic listening along with emotional support were provided to client today to continue building trust and rapport. Ongoing review of and education regarding the grieving process and validated emotions expressed as part of her bereavement experience. Commended Rachel Luna on participating in activities of meaning and that keep her brother's memories alive to share with friends and family.  Reminded that if she finds herself becoming depressed, anxious, angry,etc.that hospice bereavement support groups are helpful for some people.  Notified Rachel Luna that LCSW will be leaving [facility] on 01/10/15, and provided her with a list of additional OPT providers and also informed that Rachel Luna is in this practice as well.  Commended client on progress made and motivation to participate in the therapy process.  Thanked her for trusting LCSW to be a part of her journey towards healing.  Diagnosis:  Bearevement  BiPolar Disorder, MRE, Mixed  Unspecified Anxiety  Plan:  LCSW will assess client's needs at final session and assist with referral to outpatient therapist at client's request.  LCSW will coordinate client's care with Psychiatrist, Dr. Jimmye Norman, PRN.  Rachel Luna will take medications as prescribed and keep all appointments.  Rachel Luna will contact this clinic in between appointments PRN and continue working with  grief counselor and attending bereavement support groups PRN.     Miguel Dibble, LCSW 12/19/2014

## 2014-12-19 NOTE — Progress Notes (Signed)
[facility] MD/PA/NP OP Progress Note  12/19/2014 12:30 PM DALEEN STEINHAUS  MRN:  [id number]  Subjective:  Patient returns for follow-up of her major depressive disorder. Patient reports that her depression is somewhat better. She indicates that she is sleeping well but attributes that to her primary care giving her some amitriptyline. I did tell her that she should discontinue her trazodone. She said indicated the trazodone was not effective for her. She indicates she is bright and looks forward to [place]. She states that her late brother enjoyed Halloween and thus the family is planning a big celebration in [place].  She indicated that she is feels like she is coping fairly well with her brother's death. She thinks Probation officer for help. She brings in a Teacher, music figurine. Writer did inquire about the cost and indicated that certain gifts would probably not be acceptable. However patient indicated that she got this at the [place] store and really wanted this Probation officer and her therapist have them. I indicated I would display them in the clinic for all to enjoy a along with the other Halloween decorations.  She denied any side effects or medications and felt they were working well. Chief Complaint:  Chief Complaint    Follow-up; Medication Refill     Visit Diagnosis:  No diagnosis found.  Past Medical History:  Past Medical History  Diagnosis Date  . Hypertension   . Depression   . Bipolar disorder ([place])   . Arthritis   . Hyperlipidemia   . GERD (gastroesophageal reflux disease)   . Gout   . Fibroids   . chronic low back pain   . Obesity   . Chronic low back pain 02/18/2014    Past Surgical History  Procedure Laterality Date  . Spine surgery    . Knee arthroplasty    . Cholecystectomy    . Ovarian cyst removal    . Abdominal hysterectomy    . Foot surgery Left    Family History:  Family History  Problem Relation Age of Onset  . Diabetes Mother   . Cancer Mother     breast  .  Heart disease Mother   . Mental illness Mother     bipolar disorder  . Stroke Mother   . Diabetes Father   . Hypertension Father   . Diabetes Brother   . Bipolar disorder Brother   . Diabetes Brother    Social History:  Social History   Social History  . Marital Status: Divorced    Spouse Name: N/A  . Number of Children: 0  . Years of Education: 10   Occupational History  .     Social History Main Topics  . Smoking status: Never Smoker   . Smokeless tobacco: Never Used  . Alcohol Use: No  . Drug Use: No  . Sexual Activity: Not Currently   Other Topics Concern  . None   Social History Narrative   Patient is divorced and lives with a roommate.   Patient is on disability.   Patient has a 10th grade education.   Patient is right-handed.   Patient drinks 6 sodas daily.            Additional History:  Assessment:   Musculoskeletal: Strength & Muscle Tone: within normal limits Gait & Station: What slow but patient has her left foot/toe healing. Patient leans: N/A  Psychiatric Specialty Exam: Depression        Associated symptoms include does not have insomnia and no  suicidal ideas.  Past medical history includes anxiety.   Anxiety Patient reports no insomnia, nervous/anxious behavior or suicidal ideas.      Review of Systems  Psychiatric/Behavioral: Negative for depression (States she does has periods where she gets down when she thinks about her brother but otherwise feels her mood is been stable), suicidal ideas, hallucinations, memory loss and substance abuse. The patient is not nervous/anxious and does not have insomnia.   All other systems reviewed and are negative.   Blood pressure 132/82, pulse 95, temperature 97.6 F (36.4 C), temperature source Tympanic, height 5\' 4"  (1.626 m), weight 258 lb 9.6 oz (117.3 kg), SpO2 91 %.Body mass index is 44.37 kg/(m^2).  General Appearance: Well Groomed  Eye Contact:  Good  Speech:  Normal Rate  Volume:  Normal   Mood:  Good  Affect:  Bright, able to laugh and smile   Thought Process:  Linear and Logical  Orientation:  Full (Time, Place, and Person)  Thought Content:  Negative  Suicidal Thoughts:  No  Homicidal Thoughts:  No  Memory:  Immediate;   Good Recent;   Good Remote;   Good  Judgement:  Good  Insight:  Good  Psychomotor Activity:  Negative  Concentration:  Good  Recall:  Good  Fund of Knowledge: Good  Language: Good  Akathisia:  Negative  Handed:  Right unknown   AIMS (if indicated):  Done today, normal  Assets:  Communication Skills Desire for Improvement Social Support  ADL's:  Intact  Cognition: WNL  Sleep:  poor   Is the patient at risk to self?  No. Has the patient been a risk to self in the past 6 months?  No. Has the patient been a risk to self within the distant past?  No. Is the patient a risk to others?  No. Has the patient been a risk to others in the past 6 months?  No. Has the patient been a risk to others within the distant past?  No.  Current Medications: Current Outpatient Prescriptions  Medication Sig Dispense Refill  . amitriptyline (ELAVIL) 25 MG tablet Take 25 mg by mouth at bedtime.    . ARIPiprazole (ABILIFY) 5 MG tablet take 1 tablet by mouth once daily 30 tablet 1  . baclofen (LIORESAL) 10 MG tablet Take 1 tablet (10 mg total) by mouth 3 (three) times daily. 90 each 6  . carbamazepine (TEGRETOL) 200 MG tablet take 1 and 1/2 tablets by mouth twice a day 90 tablet 6  . clonazePAM (KLONOPIN) 1 MG tablet Take 1 tablet (1 mg total) by mouth 4 (four) times daily as needed for anxiety. 120 tablet 4  . CRESTOR 20 MG tablet     . diptheria-tetanus toxoids [facility]) 2-2 LF/0.5ML injection     . DULoxetine (CYMBALTA) 60 MG capsule Take 2 capsules (120 mg total) by mouth daily. 60 capsule 2  . estradiol (ESTRACE) 1 MG tablet Take 1 tablet (1 mg total) by mouth daily. 30 tablet 12  . FLUARIX QUADRIVALENT 0.5 ML injection     . fluticasone (FLONASE) 50  MCG/ACT nasal spray Place 2 sprays into both nostrils daily as needed (seasonal allergies).     . gabapentin (NEURONTIN) 800 MG tablet Take 1 tablet (800 mg total) by mouth 4 (four) times daily. 120 tablet 6  . losartan-hydrochlorothiazide (HYZAAR) 50-12.5 MG tablet Take 1 tablet by mouth daily.    . Olopatadine HCl 0.2 % SOLN Place 1 drop into both eyes daily as needed (seasonal  allergies).    Marland Kitchen omeprazole (PRILOSEC) 20 MG capsule Take 20 mg by mouth daily.     . pramipexole (MIRAPEX) 0.25 MG tablet take 1 tablet by mouth DURING THE DAY and 3 tablets AT NIGHT 120 tablet 6  . Cholecalciferol (VITAMIN D3) 2000 UNITS TABS Take 2,000 mg by mouth daily.    Marland Kitchen doxycycline (VIBRA-TABS) 100 MG tablet Take 1 tablet (100 mg total) by mouth 2 (two) times daily. (Patient not taking: Reported on 12/19/2014) 20 tablet 0  . PAZEO 0.7 % SOLN      No current facility-administered medications for this visit.    Medical Decision Making:  Established Problem, Worsening (2)  Treatment Plan Summary:Medication management   major depressive disorder, recurrent, moderate- continue her Cymbalta 120 mg daily. Continue Abilify 5 mg in the morning to augment for depression. She has a laboratory slip to get metabolic labs drawn she is going to her primary care to have labs done and will have these done there. I've given her another lab slip. Patient case she still has a laboratory slip and will be going later this month to have labs done with her primary care.  Anxiety--continue Klonopin to 1 mg 4 times a day.   She'll follow-up in 3 months. She's been encouraged call any questions or concerns prior to her next appointment.  Faith Rogue 12/19/2014, 12:30 PM

## 2014-12-26 ENCOUNTER — Ambulatory Visit (INDEPENDENT_AMBULATORY_CARE_PROVIDER_SITE_OTHER): Payer: Medicare Other | Admitting: Podiatry

## 2014-12-26 ENCOUNTER — Ambulatory Visit (INDEPENDENT_AMBULATORY_CARE_PROVIDER_SITE_OTHER): Payer: Medicare Other

## 2014-12-26 ENCOUNTER — Encounter: Payer: Self-pay | Admitting: Podiatry

## 2014-12-26 VITALS — BP 148/90 | HR 96 | Resp 18

## 2014-12-26 DIAGNOSIS — M719 Bursopathy, unspecified: Secondary | ICD-10-CM | POA: Diagnosis not present

## 2014-12-26 DIAGNOSIS — M21629 Bunionette of unspecified foot: Secondary | ICD-10-CM | POA: Diagnosis not present

## 2014-12-26 DIAGNOSIS — R52 Pain, unspecified: Secondary | ICD-10-CM

## 2014-12-26 MED ORDER — HYDROCODONE-ACETAMINOPHEN 10-325 MG PO TABS: 1.0000 | ORAL_TABLET | Freq: Three times a day (TID) | ORAL | Status: DC | PRN

## 2014-12-27 NOTE — Progress Notes (Signed)
Patient ID: Rachel Luna, female   DOB: 1955-12-06, 59 y.o.   MRN: [id number]  Subjective: Patient presents the office concerned left foot pain which is been worsening over the last 2 months. She states that the pain is worsened since she last saw me and she's had pain returned to move her toes. The majority of her pain seems to be localized the outside portion of the fifth toe for which she points to her prominent tailor's bunion. She states that she has pain returned to walk and put pressure to her foot. She wears a flat shoe as she is finding wide shoe that she can afford. She states the incisional top of her left foot from prior surgery is healed and she has not noticed any drainage or opening within the incision. She denies any recent injury or trauma. Denies any swelling or redness to her foot. No other complaints at this time. She denies any systemic complaints such as fevers, chills, nausea, vomiting. No calf pain, chest pain, shortness of breath.  Objective: AAO 3, NAD DP/PT pulses 2/4, CRT less than 3 seconds Protective sensation appears to be intact with Derrel Nip monofilament Scars localized overlying the dorsal aspect of the left midfoot from prior surgery. There is no surrounding erythema, ascending cellulitis, drainage. There is no tenderness to palpation. There is mild tenderness palpation upon the left fifth metatarsal head upon a prominent Taylor's bunion deformity. There is localized edema to this area without any associated erythema her increase in warmth. There is noted MPJ range of motion. There is no specific area pinpoint bony tenderness or pain the vibratory sensation to bilateral lower she needs. There is no other areas of edema, erythema, increase in warmth. Range of motion is intact to the digits. There is no open lesions or pre-ulcer lesions identified elsewhere. No pain with calf compression, swelling, warmth, erythema.  Assessment: 59 year old female left foot pain  with tailors bunion, bursitis  Plan: -Treatment options discussed including all alternatives, risks, and complications -X-rays were obtained and reviewed with the patient.  -Etiology of symptoms were discussed -Discussed a steroid injection into the area of maximal tenderness along the left fifth metatarsal alternatives bunion and prominent bursa. Discussed risks, complications for which she understands verbally consents to the injection. Under sterile conditions a total of 1.5 mL of a mixture of Dexamethasone phosphate and 0.5% Marcaine plain was infiltrated into the area of maximal tenderness. She tolerated the injection well any complications. Post injection care was discussed. -Continue supportive shoe gear. Discussed with her what to look form purchase an issue. She will look into this. -Follow-up visit symptoms do not resolve within 4 weeks or sooner if any problems are to arise. Call any questions or concerns in the meantime.  Celesta Gentile, DPM

## 2015-01-08 ENCOUNTER — Ambulatory Visit (INDEPENDENT_AMBULATORY_CARE_PROVIDER_SITE_OTHER): Payer: 59 | Admitting: Licensed Clinical Social Worker

## 2015-01-08 DIAGNOSIS — F3160 Bipolar disorder, current episode mixed, unspecified: Secondary | ICD-10-CM | POA: Diagnosis not present

## 2015-01-08 DIAGNOSIS — F411 Generalized anxiety disorder: Secondary | ICD-10-CM | POA: Diagnosis not present

## 2015-01-08 NOTE — Progress Notes (Signed)
THERAPIST PROGRESS NOTE  Session Time: 10:15 a.m. - 11:10 a.m.  Participation Level: Active  Behavioral Response: Casual and NeatAlertAnxious and Depressed  Type of Therapy: Individual Therapy  Treatment Goals addressed: Anxiety and Coping  Interventions: Solution Focused, Strength-based, Supportive, Reframing and Other: Relapse prevention  Summary: Rachel Luna is a 59 y.o. female who presents with report of depression and anxiety as evidenced by statement "I've been eating my Xanax and drinking wine."  She was dressed nicely today with make up on and her hair fixed and stated "A friend told me to dress up so I'd feel better."  She is dissatisfied with the city where she lives and spends a lot of time in [place] with her father and other brother and his wife.  She was appropriately sad and worried during session since only living sibling is pending diagnosis which could be bladder cancer.  She talked about longing to move back towards [place] where she lived years ago before "The church people came and got me. I was living in sin and they thought they had to save me."  Much frustration expressed about how much medication she takes and how many medical conditions she is being treated for.  Rachel Luna is seeing a Podiatrist tomorrow due to worsening pain in her left foot.  She will meet with her PCP to get a handicap parking sticker in an effort to make getting out easier for her.  "I'm ready to get off this medicine. I take 14 pills in the morning and 9 pills at night."  "As soon as I got on disability my whole world changed. I had a purpose to get up and now I don't. It doesn't matter now if I get up."   Rachel Luna talked about how heavily she drank in the past yet seemed to minimize the impact that this had on her life.  She denied having SI and voiced understanding of at least some factors that can contribute to relapse of both addiction and mood disorders.  Client without insight into other  ways to manage anxiety other than through use of medications and alcohol.  Client thanked Rachel Luna for support during OPT sessions and expressed "I've enjoyed talking to you. You've helped me with a lot of things."  She expressed understanding of availability of other Rachel Luna, Rachel Luna, in this practice and she was introduced to Ms. Rachel Luna during session.  Rachel Luna able to identify that she may need extra talk therapy during the Holidays.  Suicidal/Homicidal: Negativewithout intent/plan  Therapist Response/Interventions:    Assessed client's functional status and explored with her how she thought she was coping with her grief.  Empathic listening along with emotional support were provided to client.  Rachel Luna expressed concern about mixing alcohol with Xanax and encouraged her to talk with her healthcare providers, Dr. Jimmye Norman and/or Rachel Luna, Elmyra Ricks, should anxiety and depression worsen and contribute to increasing alcohol use.  Thanked her for trusting Rachel Luna to be a part of her journey towards healing and encouraged her to know that she has additional options available to access coping skills to deal more effectively with unpleasant thoughts, feelings and personal situations.  Plan:  Rachel Luna will assess client's needs at final session and assist with referral to outpatient therapist at client's request.  Rachel Luna will coordinate client's care with Psychiatrist, Dr. Jimmye Norman, PRN.  Rachel Luna will take medications as prescribed and keep all appointments.  Rachel Luna will contact this clinic in between appointments PRN and continue working with grief counselor  and attending bereavement support groups PRN.    Diagnosis:  Bearevement  BiPolar Disorder, MRE, Mixed  Unspecified Anxiety   Miguel Dibble, Rachel Luna 01/08/2015

## 2015-01-09 ENCOUNTER — Ambulatory Visit (INDEPENDENT_AMBULATORY_CARE_PROVIDER_SITE_OTHER): Payer: Medicare Other | Admitting: Podiatry

## 2015-01-09 ENCOUNTER — Ambulatory Visit (INDEPENDENT_AMBULATORY_CARE_PROVIDER_SITE_OTHER): Payer: Medicare Other

## 2015-01-09 DIAGNOSIS — R52 Pain, unspecified: Secondary | ICD-10-CM | POA: Diagnosis not present

## 2015-01-09 DIAGNOSIS — M19072 Primary osteoarthritis, left ankle and foot: Secondary | ICD-10-CM

## 2015-01-09 MED ORDER — HYDROCODONE-ACETAMINOPHEN 10-325 MG PO TABS: 1.0000 | ORAL_TABLET | Freq: Three times a day (TID) | ORAL | Status: DC | PRN

## 2015-01-09 NOTE — Progress Notes (Signed)
Patient ID: Rachel Luna, female   DOB: [birth year], 59 y.o.   MRN: [id number]   Subjective: 59 year old female presents the office in followup evaluation left foot pain. She states that she has continued tenderness to the top of her left foot mostly on the midfoot. His his been ongoing for several months and his been a recurring issue. She states that she continues to have increasing pain in the top of her foot. She had a fall this morning while the bank that she denies a specific injury. She is having pain to her foot prior to this injury. She denies any swelling or redness around the incision. She states this incision of prior surgery remains well-healed she has not noticed any openings or drainage. No other complaints at this time.  Objective: AAO x3, NAD DP/PT pulses palpable 2/4, CRT less than 3 seconds, pedal hair present Protective sensation intact with Derrel Nip monofilament There is continued diffuse tenderness on the midfoot of the left foot mostly along the Lisfranc joint and the midfoot. There is no specific tenderness course the toe at this time. There is no specific area pinpoint bony tenderness. There is no pain vibratory sensation. There is an no overlying edema, erythema, increase in warmth. There is no open lesions or pre-ulcerative lesions. Scar is well-healed around the site of the prior surgery. No pain to the contralateral extremity.There is no pain with calf compression, swelling, warmth, erythema.  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  Plan: -Treatment options discussed including all alternatives, risks, and complications -X-rays were obtained and reviewed with the patient.  -Etiology of symptoms were discussed -At this time I recommended to continue with immobilization. Recommend or go back into a cam boot at a surgical shoe for short he has. Continue ice and elevation. Pain medications as needed. Refilled Vicodin.  Discussed that she has discussed her further pain management symptoms with her primary care physician and/or her psychiatrist. -Discussed with her supportive shoe gear and orthotics. She typically wears a very flat sandal. We will further discuss orthotics at next appointment. However at this time she is having quite a bit of pain so we will immobilize. -Follow-up in 3 weeks or sooner if any problems arise. In the meantime, encouraged to call the office with any questions, concerns, change in symptoms.   Celesta Gentile, DPM

## 2015-02-04 ENCOUNTER — Encounter: Payer: Self-pay | Admitting: Podiatry

## 2015-02-04 ENCOUNTER — Ambulatory Visit (INDEPENDENT_AMBULATORY_CARE_PROVIDER_SITE_OTHER): Payer: Medicare Other | Admitting: Podiatry

## 2015-02-04 ENCOUNTER — Ambulatory Visit (INDEPENDENT_AMBULATORY_CARE_PROVIDER_SITE_OTHER): Payer: Medicare Other

## 2015-02-04 VITALS — BP 129/73 | HR 81 | Resp 18

## 2015-02-04 DIAGNOSIS — M79672 Pain in left foot: Secondary | ICD-10-CM

## 2015-02-04 DIAGNOSIS — M19072 Primary osteoarthritis, left ankle and foot: Secondary | ICD-10-CM

## 2015-02-04 DIAGNOSIS — M1990 Unspecified osteoarthritis, unspecified site: Secondary | ICD-10-CM | POA: Diagnosis not present

## 2015-02-04 DIAGNOSIS — G8929 Other chronic pain: Secondary | ICD-10-CM

## 2015-02-04 DIAGNOSIS — R52 Pain, unspecified: Secondary | ICD-10-CM | POA: Diagnosis not present

## 2015-02-05 ENCOUNTER — Other Ambulatory Visit: Payer: Self-pay

## 2015-02-05 ENCOUNTER — Ambulatory Visit (INDEPENDENT_AMBULATORY_CARE_PROVIDER_SITE_OTHER): Payer: 59 | Admitting: Licensed Clinical Social Worker

## 2015-02-05 DIAGNOSIS — F3160 Bipolar disorder, current episode mixed, unspecified: Secondary | ICD-10-CM | POA: Diagnosis not present

## 2015-02-05 LAB — ARTHRITIS PANEL: Anti Nuclear Antibody(ANA): POSITIVE — AB

## 2015-02-05 MED ORDER — ARIPIPRAZOLE 5 MG PO TABS: 5.0000 mg | ORAL_TABLET | Freq: Every day | ORAL | Status: DC

## 2015-02-05 NOTE — Telephone Encounter (Signed)
pt notified that rx was sent in .

## 2015-02-05 NOTE — Progress Notes (Signed)
Patient ID: Rachel Luna, female   DOB: 03-28-1955, 58 y.o.   MRN: [id number]   Subjective: 59 year old female presents the office in followup evaluation left foot pain. She states this is continued have the same pain that has not improved. She also says has not worsened. This is been ongoing on for several months. Denies any recent injury or trauma other than previously mentioned. There's been no swelling over the area and there is been no redness or warmth. She states that her foot hurts on a consistent basis. Denies any systemic complaints for this fevers, chills, nausea, vomiting. No other complaints at this time.  Objective: AAO x3, NAD DP/PT pulses palpable 2/4, CRT less than 3 seconds, pedal hair present Protective sensation intact with Derrel Nip monofilament There is continued diffuse tenderness on the midfoot of the left foot mostly along the Lisfranc joint and the midfoot. There is no specific tenderness course the toe at this time. There is no specific area pinpoint bony tenderness. There is no pain vibratory sensation. There is an no significant edema, and there is no surrounding erythema, increase in warmth. There is no open lesions or pre-ulcerative lesions. Scar is well-healed around the site of the prior surgery. No pain to the contralateral extremity.There is no pain with calf compression, swelling, warmth, erythema.  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 We'll also obtain an MRI of the left foot. -For now continue with surgical shoe to help offload the area and take pressure off the foot. -Ice elevation -Follow-up after MRI/blood work or sooner if any problems arise. In the meantime, encouraged to call the office with any questions, concerns,  change in symptoms.   Celesta Gentile, DPM

## 2015-02-05 NOTE — Telephone Encounter (Signed)
pt has appt on  03-20-15 , pt will not have enought to due until her next appt. pt has enought to due until  02-10-15.  [facility] [address] [place]

## 2015-02-06 ENCOUNTER — Telehealth: Payer: Self-pay | Admitting: *Deleted

## 2015-02-06 DIAGNOSIS — G8929 Other chronic pain: Secondary | ICD-10-CM

## 2015-02-06 DIAGNOSIS — M19072 Primary osteoarthritis, left ankle and foot: Secondary | ICD-10-CM

## 2015-02-06 DIAGNOSIS — M79672 Pain in left foot: Principal | ICD-10-CM

## 2015-02-06 NOTE — Telephone Encounter (Addendum)
-----   Message from Trula Slade, DPM sent at 02/05/2015  6:25 PM EST ----- Can you please order an MRI of the left foot; chronic foot pain/osteoarthritis. Thanks. EVICORE NO PRIOR AUTHORIZATION NEEDED AT THIS TIME," NOTE:  This [facility] member does not require prior authorization for OUTPATIENT Radiology through [place] or [facility] at this time."  Faxed to [facility].

## 2015-02-10 ENCOUNTER — Telehealth: Payer: Self-pay | Admitting: *Deleted

## 2015-02-10 DIAGNOSIS — M19072 Primary osteoarthritis, left ankle and foot: Secondary | ICD-10-CM

## 2015-02-10 NOTE — Telephone Encounter (Addendum)
Dr. Jacqualyn Posey referred pt to Rheumatology.  Clinicals, pt data and insurance sent.  02/10/2105 Rachel Luna states [employer] do not take Medicaid please call. I spoke with [employer] and she said they didn't take Medicaid, and didn't know who did.  Pt referred to [facility] Dr. Barb Merino.  02/14/2015-Pt asked if the labs had returned, I told her the Rheumatoid factor was high and that was why she was referred to [facility].  Pt asked if she had anymore appts here, I told her only as she needed.

## 2015-02-11 NOTE — Progress Notes (Signed)
   THERAPIST PROGRESS NOTE  Session Time: 72min  Participation Level: Active  Behavioral Response: CasualAlertDepressed  Type of Therapy: Individual Therapy  Treatment Goals addressed: Coping  Interventions: CBT, Motivational Interviewing, Solution Focused and Supportive  Summary: Rachel Luna is a 59 y.o. female who presents with continued symptoms of her diagnosis.  Discussion of the transition in therapist.  Assisted with listing stressors. Discussion on her triggers and coping skills utilized to relieve stressors. Reviewed symptoms and treatment plan.  Suicidal/Homicidal: Nowithout intent/plan  Therapist Response: LCSW provided Patient with ongoing emotional support and encouragement.  Normalized her feelings.  Commended Patient on her progress and reinforced the importance of client staying focused on her own strengths and resources and resiliency. Processed various strategies for dealing with stressors.    Plan: Return again in 2 weeks.  Diagnosis: Axis I: Bipolar, mixed    Axis II: No diagnosis    Lubertha South 02/11/2015

## 2015-02-18 ENCOUNTER — Other Ambulatory Visit: Payer: Self-pay | Admitting: Neurology

## 2015-02-24 ENCOUNTER — Ambulatory Visit: Payer: 59 | Admitting: Licensed Clinical Social Worker

## 2015-02-27 ENCOUNTER — Ambulatory Visit: Admission: RE | Admit: 2015-02-27 | Payer: Medicare Other | Source: Ambulatory Visit

## 2015-03-03 ENCOUNTER — Telehealth: Payer: Self-pay | Admitting: *Deleted

## 2015-03-03 NOTE — Telephone Encounter (Signed)
Kirke Shaggy states pt is scheduled for MRI 02/27/2015 and needs prior authorization.  I left message informing Kirke Shaggy, she called after-hours on the day before our office went on holiday, and I just received her message 03/03/2015.  I reviewed pt's pre-cert status and Prior Authorization was Not Necessary at the time.

## 2015-03-04 NOTE — Progress Notes (Signed)
Refilled- still takin

## 2015-03-05 DIAGNOSIS — M79673 Pain in unspecified foot: Secondary | ICD-10-CM | POA: Insufficient documentation

## 2015-03-05 DIAGNOSIS — R768 Other specified abnormal immunological findings in serum: Secondary | ICD-10-CM | POA: Insufficient documentation

## 2015-03-13 ENCOUNTER — Ambulatory Visit (HOSPITAL_COMMUNITY)
Admission: RE | Admit: 2015-03-13 | Discharge: 2015-03-13 | Disposition: A | Discharge status: Home / Self Care | Payer: Medicare Other | Source: Ambulatory Visit | Attending: Podiatry | Admitting: Podiatry

## 2015-03-13 DIAGNOSIS — M79672 Pain in left foot: Secondary | ICD-10-CM | POA: Diagnosis present

## 2015-03-13 DIAGNOSIS — M19072 Primary osteoarthritis, left ankle and foot: Secondary | ICD-10-CM | POA: Insufficient documentation

## 2015-03-13 DIAGNOSIS — G8929 Other chronic pain: Secondary | ICD-10-CM

## 2015-03-18 ENCOUNTER — Ambulatory Visit: Payer: Medicare Other | Admitting: Podiatry

## 2015-03-20 ENCOUNTER — Ambulatory Visit: Payer: 59 | Admitting: Licensed Clinical Social Worker

## 2015-03-20 ENCOUNTER — Encounter: Payer: Self-pay | Admitting: Psychiatry

## 2015-03-20 ENCOUNTER — Ambulatory Visit (INDEPENDENT_AMBULATORY_CARE_PROVIDER_SITE_OTHER): Payer: 59 | Admitting: Psychiatry

## 2015-03-20 VITALS — BP 124/86 | HR 98 | Temp 97.0°F | Ht 64.0 in | Wt 253.2 lb

## 2015-03-20 DIAGNOSIS — F411 Generalized anxiety disorder: Secondary | ICD-10-CM | POA: Diagnosis not present

## 2015-03-20 DIAGNOSIS — Z634 Disappearance and death of family member: Secondary | ICD-10-CM

## 2015-03-20 DIAGNOSIS — F3160 Bipolar disorder, current episode mixed, unspecified: Secondary | ICD-10-CM | POA: Diagnosis not present

## 2015-03-20 MED ORDER — CLONAZEPAM 1 MG PO TABS
1.0000 mg | ORAL_TABLET | Freq: Four times a day (QID) | ORAL | Status: DC | PRN
Start: 2015-03-20 — End: 2015-08-15

## 2015-03-20 MED ORDER — ARIPIPRAZOLE 5 MG PO TABS: 5.0000 mg | ORAL_TABLET | Freq: Every day | ORAL | Status: DC

## 2015-03-20 MED ORDER — DULOXETINE HCL 60 MG PO CPEP: 120.0000 mg | ORAL_CAPSULE | Freq: Every day | ORAL | Status: DC

## 2015-03-20 NOTE — Progress Notes (Signed)
[facility] MD/PA/NP OP Progress Note  03/20/2015 11:58 AM Rachel Luna  MRN:  [id number]  Subjective:  Patient returns for follow-up of her major depressive disorder. Today patient presents crying and states this is largely due to frustrations with her pain. She states that on the way to the office she did slip out of her car and fall on the ground. She states she fell on her butt back and then her head did touch the ground but she states that her hair being balled up with a pen caused her head to be cushioned.  Dates that she is largely frustrated by dealing with her pain. She states she's been told she has rheumatoid arthritis. She states she is in the process of being referred back to an orthopedic surgeon that she is seen in the past to assess and knee problems. She states there is an issue with one knee that slips. She states she really feels like her mood would be better if she could get these pain issues under control.  She states overall she continues to feel like the medications help her although she states she does not believe any medication changes are going to completely solve her issues as she is having so many pain in joint related problems. She also states she has problems with her toes.  We spent time discussing that given that these are ongoing and chronic issues that therapy is important. She states she's gotten appointment with the therapist she used to see in this clinic at that therapist's new practice. Chief Complaint:  Chief Complaint    Follow-up; Medication Refill     Visit Diagnosis:     ICD-9-CM ICD-10-CM   1. Bipolar I disorder, most recent episode mixed ([place]) 296.60 F31.60   2. Anxiety state 300.00 F41.1   3. Bereavement V62.82 Z63.4     Past Medical History:  Past Medical History  Diagnosis Date  . Hypertension   . Depression   . Bipolar disorder ([place])   . Arthritis   . Hyperlipidemia   . GERD (gastroesophageal reflux disease)   . Gout   . Fibroids   .  chronic low back pain   . Obesity   . Chronic low back pain 02/18/2014    Past Surgical History  Procedure Laterality Date  . Spine surgery    . Knee arthroplasty    . Cholecystectomy    . Ovarian cyst removal    . Abdominal hysterectomy    . Foot surgery Left    Family History:  Family History  Problem Relation Age of Onset  . Diabetes Mother   . Cancer Mother     breast  . Heart disease Mother   . Mental illness Mother     bipolar disorder  . Stroke Mother   . Diabetes Father   . Hypertension Father   . Diabetes Brother   . Bipolar disorder Brother   . Diabetes Brother    Social History:  Social History   Social History  . Marital Status: Divorced    Spouse Name: N/A  . Number of Children: 0  . Years of Education: 10   Occupational History  .     Social History Main Topics  . Smoking status: Never Smoker   . Smokeless tobacco: Never Used  . Alcohol Use: No  . Drug Use: No  . Sexual Activity: Not Currently   Other Topics Concern  . None   Social History Narrative   Patient is divorced  and lives with a roommate.   Patient is on disability.   Patient has a 10th grade education.   Patient is right-handed.   Patient drinks 6 sodas daily.            Additional History:  Assessment:   Musculoskeletal: Strength & Muscle Tone: within normal limits Gait & Station: What slow but patient has her left foot/toe healing. Patient leans: N/A  Psychiatric Specialty Exam: Depression        Associated symptoms include does not have insomnia and no suicidal ideas.  Past medical history includes anxiety.   Anxiety Patient reports no insomnia, nervous/anxious behavior or suicidal ideas.      Review of Systems  Psychiatric/Behavioral: Negative for depression (States she does has periods where she gets down when she thinks about her brother but otherwise feels her mood is been stable), suicidal ideas, hallucinations, memory loss and substance abuse. The patient  is not nervous/anxious and does not have insomnia.   All other systems reviewed and are negative.   Blood pressure 124/86, pulse 98, temperature 97 F (36.1 C), temperature source Tympanic, height 5\' 4"  (1.626 m), weight 253 lb 3.2 oz (114.851 kg), SpO2 95 %.Body mass index is 43.44 kg/(m^2).  General Appearance: Well Groomed  Eye Contact:  Good  Speech:  Normal Rate  Volume:  Normal  Mood:  Good  Affect:  Tearful initially, somewhat brightened at the end of the appointment   Thought Process:  Linear and Logical  Orientation:  Full (Time, Place, and Person)  Thought Content:  Negative  Suicidal Thoughts:  No  Homicidal Thoughts:  No  Memory:  Immediate;   Good Recent;   Good Remote;   Good  Judgement:  Good  Insight:  Good  Psychomotor Activity:  Negative  Concentration:  Good  Recall:  Good  Fund of Knowledge: Good  Language: Good  Akathisia:  Negative  Handed:  Right unknown   AIMS (if indicated):  Done today, normal  Assets:  Communication Skills Desire for Improvement Social Support  ADL's:  Intact  Cognition: WNL  Sleep:  poor   Is the patient at risk to self?  No. Has the patient been a risk to self in the past 6 months?  No. Has the patient been a risk to self within the distant past?  No. Is the patient a risk to others?  No. Has the patient been a risk to others in the past 6 months?  No. Has the patient been a risk to others within the distant past?  No.  Current Medications: Current Outpatient Prescriptions  Medication Sig Dispense Refill  . amitriptyline (ELAVIL) 25 MG tablet Take 25 mg by mouth at bedtime.    . ARIPiprazole (ABILIFY) 5 MG tablet Take 1 tablet (5 mg total) by mouth daily. 30 tablet 4  . baclofen (LIORESAL) 10 MG tablet take 1 tablet by mouth three times a day 90 tablet 2  . carbamazepine (TEGRETOL) 200 MG tablet take 1 and 1/2 tablets by mouth twice a day 90 tablet 6  . clonazePAM (KLONOPIN) 1 MG tablet Take 1 tablet (1 mg total) by mouth  4 (four) times daily as needed for anxiety. 120 tablet 4  . CRESTOR 20 MG tablet     . diptheria-tetanus toxoids [facility]) 2-2 LF/0.5ML injection     . DULoxetine (CYMBALTA) 60 MG capsule Take 2 capsules (120 mg total) by mouth daily. 60 capsule 4  . estradiol (ESTRACE) 1 MG tablet Take 1 tablet (  1 mg total) by mouth daily. 30 tablet 12  . FLUARIX QUADRIVALENT 0.5 ML injection     . fluticasone (FLONASE) 50 MCG/ACT nasal spray Place 2 sprays into both nostrils daily as needed (seasonal allergies).     . gabapentin (NEURONTIN) 800 MG tablet Take 1 tablet (800 mg total) by mouth 4 (four) times daily. 120 tablet 6  . losartan-hydrochlorothiazide (HYZAAR) 50-12.5 MG tablet Take 1 tablet by mouth daily.    Marland Kitchen omeprazole (PRILOSEC) 20 MG capsule Take 20 mg by mouth daily.     . pramipexole (MIRAPEX) 0.25 MG tablet take 1 tablet by mouth DURING THE DAY and 3 tablets AT NIGHT 120 tablet 6   No current facility-administered medications for this visit.    Medical Decision Making:  Established Problem, Worsening (2)  Treatment Plan Summary:Medication management   major depressive disorder, recurrent, moderate- continue her Cymbalta 120 mg daily. Continue Abilify 5 mg in the morning to augment for depression. Give her a laboratory slip for the metabolic labs at her last appointment. She has not had them done. However she states she does have instructions of fasting labs at her primary care office on 05/05/2015.  Anxiety--continue Klonopin to 1 mg 4 times a day.   She'll follow-up in 3 months. I discussed with her my departure from the clinic and that she can continue with another psychiatrist within this clinic. She's been encouraged call any questions or concerns prior to her next appointment.  Faith Rogue 03/20/2015, 11:58 AM

## 2015-03-25 ENCOUNTER — Ambulatory Visit (INDEPENDENT_AMBULATORY_CARE_PROVIDER_SITE_OTHER): Payer: Medicare Other | Admitting: Podiatry

## 2015-03-25 ENCOUNTER — Encounter: Payer: Self-pay | Admitting: Podiatry

## 2015-03-25 DIAGNOSIS — M2142 Flat foot [pes planus] (acquired), left foot: Secondary | ICD-10-CM

## 2015-03-25 DIAGNOSIS — R262 Difficulty in walking, not elsewhere classified: Secondary | ICD-10-CM

## 2015-03-25 DIAGNOSIS — M779 Enthesopathy, unspecified: Secondary | ICD-10-CM

## 2015-03-25 DIAGNOSIS — M19072 Primary osteoarthritis, left ankle and foot: Secondary | ICD-10-CM | POA: Diagnosis not present

## 2015-03-25 DIAGNOSIS — M2141 Flat foot [pes planus] (acquired), right foot: Secondary | ICD-10-CM

## 2015-03-25 MED ORDER — HYDROCODONE-ACETAMINOPHEN 5-325 MG PO TABS: 1.0000 | ORAL_TABLET | ORAL | Status: DC | PRN

## 2015-03-27 DIAGNOSIS — M19079 Primary osteoarthritis, unspecified ankle and foot: Secondary | ICD-10-CM | POA: Insufficient documentation

## 2015-03-27 NOTE — Progress Notes (Addendum)
Patient ID: Rachel Luna, female   DOB: 08-Jul-1955, 60 y.o.   MRN: [id number]  Subjective: 60 year old female presents the office in followup evaluation left foot and to discuss MRI. She states that she continues to get significant pain to her left foot mostly when walking for prolonged periods of time. She gets no pain at rest. No recent injury or trauma. No tingling or numbness. No other complaints at this time.  Objective: AAO x3, NAD DP/PT pulses palpable 2/4, CRT less than 3 seconds, pedal hair present Protective sensation intact with Derrel Nip monofilament There is continued diffuse tenderness on the midfoot of the left foot mostly along the Lisfranc joint and the midfoot. Incision from prior surgeries well-healed with scar. There is no pain vibratory sensation to the foot. There is an no significant edema, and there is no surrounding erythema, increase in warmth. There is no open lesions or pre-ulcerative lesions. Scar is well-healed around the site of the prior surgery. No pain to the contralateral extremity.There is no pain with calf compression, swelling, warmth, erythema.  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 -Discussed steroid injection around the area of maximal tenderness the midfoot for which she wishes to proceed with. Under sterile conditions a mixture of Kenalog 10, 0.5% Marcaine plain and 2% lidocaine plain was infiltrated to the area of maximal tenderness about complications. Post injection care was discussed. -Discussed brace/orthotic. She had difficulty walking and significant osteoarthritis of the foot with a flatfoot present.  I will have her  see Betha at her next clinic to determine what would be the best option for her. I would like to hold off on surgery.    Celesta Gentile, DPM

## 2015-04-09 ENCOUNTER — Ambulatory Visit (INDEPENDENT_AMBULATORY_CARE_PROVIDER_SITE_OTHER): Payer: Medicare Other | Admitting: Podiatry

## 2015-04-09 ENCOUNTER — Telehealth: Payer: Self-pay | Admitting: *Deleted

## 2015-04-09 DIAGNOSIS — M2141 Flat foot [pes planus] (acquired), right foot: Secondary | ICD-10-CM

## 2015-04-09 DIAGNOSIS — M2142 Flat foot [pes planus] (acquired), left foot: Secondary | ICD-10-CM

## 2015-04-09 DIAGNOSIS — M19072 Primary osteoarthritis, left ankle and foot: Secondary | ICD-10-CM

## 2015-04-09 NOTE — Telephone Encounter (Signed)
Pt states she will be in the office tomorrow to see Benjie Karvonen, and would like a prescription for pain medication and she pick up the rx then.  Dr. Jacqualyn Posey and his chart notes state he will not prescribe narcotic pain medication again.  Informed pt.

## 2015-04-10 DIAGNOSIS — M19079 Primary osteoarthritis, unspecified ankle and foot: Secondary | ICD-10-CM

## 2015-04-11 NOTE — Progress Notes (Signed)
Patient was casted for [place] brace today. She as seen by Benjie Karvonen, certified pedorthist.

## 2015-05-07 ENCOUNTER — Ambulatory Visit (INDEPENDENT_AMBULATORY_CARE_PROVIDER_SITE_OTHER): Payer: Medicare Other | Admitting: Podiatry

## 2015-05-07 DIAGNOSIS — M19072 Primary osteoarthritis, left ankle and foot: Secondary | ICD-10-CM

## 2015-05-07 DIAGNOSIS — M2141 Flat foot [pes planus] (acquired), right foot: Secondary | ICD-10-CM | POA: Diagnosis not present

## 2015-05-07 DIAGNOSIS — M2142 Flat foot [pes planus] (acquired), left foot: Secondary | ICD-10-CM | POA: Diagnosis not present

## 2015-05-07 DIAGNOSIS — M25572 Pain in left ankle and joints of left foot: Secondary | ICD-10-CM

## 2015-05-07 DIAGNOSIS — R262 Difficulty in walking, not elsewhere classified: Secondary | ICD-10-CM

## 2015-05-07 NOTE — Progress Notes (Signed)
AFO brace was dispensed by Benjie Karvonen, certified pedorthist. Instructions given and will follow up in 4 weeks

## 2015-05-15 ENCOUNTER — Other Ambulatory Visit: Payer: Self-pay

## 2015-05-15 MED ORDER — BACLOFEN 10 MG PO TABS: 10.0000 mg | ORAL_TABLET | Freq: Three times a day (TID) | ORAL | Status: DC

## 2015-06-02 ENCOUNTER — Telehealth: Payer: Self-pay

## 2015-06-02 ENCOUNTER — Ambulatory Visit: Payer: Medicare Other | Admitting: Adult Health

## 2015-06-02 NOTE — Telephone Encounter (Signed)
Patient did not show for appt today.                         

## 2015-06-03 ENCOUNTER — Ambulatory Visit: Payer: Medicare Other | Admitting: Adult Health

## 2015-06-03 ENCOUNTER — Encounter: Payer: Self-pay | Admitting: Adult Health

## 2015-06-18 ENCOUNTER — Ambulatory Visit: Payer: 59 | Admitting: Psychiatry

## 2015-06-19 ENCOUNTER — Ambulatory Visit (INDEPENDENT_AMBULATORY_CARE_PROVIDER_SITE_OTHER): Payer: Medicare Other | Admitting: Podiatry

## 2015-06-19 ENCOUNTER — Telehealth: Payer: Self-pay | Admitting: *Deleted

## 2015-06-19 ENCOUNTER — Encounter: Payer: Self-pay | Admitting: Podiatry

## 2015-06-19 VITALS — BP 101/58 | HR 75 | Resp 18

## 2015-06-19 DIAGNOSIS — M19072 Primary osteoarthritis, left ankle and foot: Secondary | ICD-10-CM | POA: Diagnosis not present

## 2015-06-19 MED ORDER — LIDOCAINE 5 % EX PTCH: 1.0000 | MEDICATED_PATCH | CUTANEOUS | Status: DC

## 2015-06-19 NOTE — Telephone Encounter (Signed)
Pt's [facility] requires Prior Authorization and I informed pt we have [employer] that will prior authorize the Lidocaine patch and send to her if she would like.  Pt agreed and okayed sending her demographics to [place] and I gave her their contact numbers.

## 2015-06-19 NOTE — Telephone Encounter (Signed)
Faxed to [facility] [phone].

## 2015-06-22 NOTE — Progress Notes (Signed)
Patient ID: Rachel Luna, female   DOB: [birth year], 60 y.o.   MRN: [id number]  Subjective: Patient presents the office today for evaluation of left foot pain after she got it a brace today. She says the brace is too small and she does not like to wear as it was cut up her circulation she felt like. She said that she has been going barefoot and the flip-flops and she feels much better doing this. She has not noticed any swelling or redness. There is been really no change. Denies any systemic complaints such as fevers, chills, nausea, vomiting. No acute changes since last appointment, and no other complaints at this time.   Objective: AAO x3, NAD DP/PT pulses palpable bilaterally, CRT less than 3 seconds Protective sensation intact with Simms Weinstein monofilament There is mild diffuse tenderness on the midfoot on the Lisfranc joint of the left foot. There is no edema, erythema, increase in warmth. Incision is well-healed the scar from prior surgery. There is no specific area of tenderness to bilateral lower extremities. No areas of pinpoint bony tenderness or pain with vibratory sensation. MMT 5/5, ROM WNL. No edema, erythema, increase in warmth to bilateral lower extremities.  No open lesions or pre-ulcerative lesions.  No pain with calf compression, swelling, warmth, erythema  Assessment: Significant arthritis left foot  Plan: -All treatment options discussed with the patient including all alternatives, risks, complications.  -At this point she does not want to wear brace or regular shoes or orthotics. She points to wear a sandal flip-flop or go barefoot which she states is better. We'll continue this as there is not much I can do she goes barefoot. I did order lidocaine patches to use as needed as she states that she has been getting this from her friend and does help a lot. -Follow-up as needed. -Patient encouraged to call the office with any questions, concerns, change in symptoms.    Celesta Gentile, DPM

## 2015-06-24 ENCOUNTER — Telehealth: Payer: Self-pay | Admitting: *Deleted

## 2015-06-24 ENCOUNTER — Ambulatory Visit (INDEPENDENT_AMBULATORY_CARE_PROVIDER_SITE_OTHER): Payer: Self-pay | Admitting: Psychiatry

## 2015-06-24 NOTE — Telephone Encounter (Addendum)
[employer] faxed recommendation for alternative to Lidocaine 5% patches not covered by pt's insurance.  [employer] recommends Pain 360.0 MX - Baclofen 3%, Diclofenac 2.6%, Gabapentin 3%, Menthol 1%, Tetracaine 2% cream apply 1-2 pumps of pain cream to focal area 3-4 times daily.  I spoke with Nira Conn at [facility] [phone] and told her I would advise Dr. Jacqualyn Posey and fax response 06/25/2015.  06/26/2015-Dr. Jacqualyn Posey okay the orders for alternative compound Pain 360.0 MX. Faxed to [facility].

## 2015-06-25 NOTE — Telephone Encounter (Signed)
OK 

## 2015-06-26 MED ORDER — NONFORMULARY OR COMPOUNDED ITEM: Status: DC

## 2015-07-15 ENCOUNTER — Ambulatory Visit (INDEPENDENT_AMBULATORY_CARE_PROVIDER_SITE_OTHER): Payer: 59 | Admitting: Psychiatry

## 2015-07-15 ENCOUNTER — Other Ambulatory Visit: Payer: Self-pay

## 2015-07-15 ENCOUNTER — Encounter: Payer: Self-pay | Admitting: Psychiatry

## 2015-07-15 VITALS — BP 120/78 | HR 74 | Temp 96.9°F | Ht 64.0 in | Wt 227.0 lb

## 2015-07-15 DIAGNOSIS — F3160 Bipolar disorder, current episode mixed, unspecified: Secondary | ICD-10-CM | POA: Diagnosis not present

## 2015-07-15 DIAGNOSIS — F1394 Sedative, hypnotic or anxiolytic use, unspecified with sedative, hypnotic or anxiolytic-induced mood disorder: Secondary | ICD-10-CM

## 2015-07-15 MED ORDER — ARIPIPRAZOLE 5 MG PO TABS: 5.0000 mg | ORAL_TABLET | Freq: Every day | ORAL | Status: DC

## 2015-07-15 MED ORDER — AMITRIPTYLINE HCL 25 MG PO TABS: 25.0000 mg | ORAL_TABLET | Freq: Every day | ORAL | Status: DC

## 2015-07-15 MED ORDER — GABAPENTIN 800 MG PO TABS: 800.0000 mg | ORAL_TABLET | Freq: Four times a day (QID) | ORAL | Status: DC

## 2015-07-15 MED ORDER — PRAMIPEXOLE DIHYDROCHLORIDE 0.25 MG PO TABS: ORAL_TABLET | ORAL | Status: DC

## 2015-07-15 MED ORDER — DULOXETINE HCL 60 MG PO CPEP: 120.0000 mg | ORAL_CAPSULE | Freq: Every day | ORAL | Status: DC

## 2015-07-15 NOTE — Telephone Encounter (Signed)
Received faxed request from pharmacy for pramipexole refill. Retailed 30 days. Pt needs to schedule appt for additional refills.

## 2015-07-15 NOTE — Progress Notes (Signed)
[place] MD/PA/NP OP Progress Note  07/15/2015 9:03 AM Rachel Luna  MRN:  [id number]  Subjective:  Patient is a 60 year old female with history of bipolar disorder and benzodiazepine dependence who presented for follow-up appointment. She was following Dr. Jimmye Norman. Patient reported that she is feeling tired and wants to return to bed due to the rainy weather today. She reported that she is planning to go to [place] as her sister-in-law has moved there after the death of her brother last summer. Patient stated that she has been compliant with her medication but she is only taking Klonopin one time daily on most days. Patient reported that she has been prescribed Klonopin 1 mg 4 times a day by Dr. Jimmye Norman. Patient reported that she is compliant with her medications and does not have any adverse reactions.  She is also on Tegretol by her neurologist but she reported that most of the time her legs will give  way and she will fall down.   I checked her medications  on [place] controlled drug  registry and it was noted that patient has filled  her Klonopin 1 mg #120 pills on May 1 and she has been filling her prescription on a monthly basis although she is taking the medication only one pill daily.  When I discussed with the patient she reported that on certain days she takes up to 4 pills like her best friend died and she has to take 4 pills on that day.  she currently denied having any suicidal homicidal ideations or plans. She reported that she has to take care of her father and is driving  between [place] and  [place] on a daily basis.  Chief Complaint:  Chief Complaint    Follow-up; Medication Refill     Visit Diagnosis:     ICD-9-CM ICD-10-CM   1. Bipolar I disorder, most recent episode mixed ([place]) 296.60 F31.60   2. Sedative, hypnotic or anxiolytic-induced mood disorder ([facility]) 292.84 F13.94    E980.2      Past Medical History:  Past Medical History  Diagnosis Date  . Hypertension   .  Depression   . Bipolar disorder ([place])   . Arthritis   . Hyperlipidemia   . GERD (gastroesophageal reflux disease)   . Gout   . Fibroids   . chronic low back pain   . Obesity   . Chronic low back pain 02/18/2014    Past Surgical History  Procedure Laterality Date  . Spine surgery    . Knee arthroplasty    . Cholecystectomy    . Ovarian cyst removal    . Abdominal hysterectomy    . Foot surgery Left    Family History:  Family History  Problem Relation Age of Onset  . Diabetes Mother   . Cancer Mother     breast  . Heart disease Mother   . Mental illness Mother     bipolar disorder  . Stroke Mother   . Diabetes Father   . Hypertension Father   . Diabetes Brother   . Bipolar disorder Brother   . Diabetes Brother    Social History:  Social History   Social History  . Marital Status: Divorced    Spouse Name: N/A  . Number of Children: 0  . Years of Education: 10   Occupational History  .     Social History Main Topics  . Smoking status: Never Smoker   . Smokeless tobacco: Never Used  . Alcohol Use: No  .  Drug Use: No  . Sexual Activity: Not Currently   Other Topics Concern  . None   Social History Narrative   Patient is divorced and lives with a roommate.   Patient is on disability.   Patient has a 10th grade education.   Patient is right-handed.   Patient drinks 6 sodas daily.            Additional History:  Assessment:   Musculoskeletal: Strength & Muscle Tone: within normal limits Gait & Station: What slow but patient has her left foot/toe healing. Patient leans: N/A  Psychiatric Specialty Exam: Depression        Associated symptoms include does not have insomnia and no suicidal ideas.  Past medical history includes anxiety.   Anxiety Patient reports no insomnia, nervous/anxious behavior or suicidal ideas.      Review of Systems  Psychiatric/Behavioral: Positive for depression (States she does has periods where she gets down when she  thinks about her brother but otherwise feels her mood is been stable). Negative for suicidal ideas, hallucinations, memory loss and substance abuse. The patient is not nervous/anxious and does not have insomnia.   All other systems reviewed and are negative.   Blood pressure 120/78, pulse 74, temperature 96.9 F (36.1 C), temperature source Tympanic, height 5\' 4"  (1.626 m), weight 227 lb (102.967 kg), SpO2 93 %.Body mass index is 38.95 kg/(m^2).  General Appearance: Well Groomed  Eye Contact:  Good  Speech:  Normal Rate  Volume:  Normal  Mood:  Good  Affect:  Tearful initially, somewhat brightened at the end of the appointment   Thought Process:  Linear and Logical  Orientation:  Full (Time, Place, and Person)  Thought Content:  Negative  Suicidal Thoughts:  No  Homicidal Thoughts:  No  Memory:  Immediate;   Good Recent;   Good Remote;   Good  Judgement:  Good  Insight:  Good  Psychomotor Activity:  Negative  Concentration:  Good  Recall:  Good  Fund of Knowledge: Good  Language: Good  Akathisia:  Negative  Handed:  Right unknown   AIMS (if indicated):  Done today, normal  Assets:  Communication Skills Desire for Improvement Social Support  ADL's:  Intact  Cognition: WNL  Sleep:  poor   Is the patient at risk to self?  No. Has the patient been a risk to self in the past 6 months?  No. Has the patient been a risk to self within the distant past?  No. Is the patient a risk to others?  No. Has the patient been a risk to others in the past 6 months?  No. Has the patient been a risk to others within the distant past?  No.  Current Medications: Current Outpatient Prescriptions  Medication Sig Dispense Refill  . amitriptyline (ELAVIL) 25 MG tablet Take 25 mg by mouth at bedtime.    . ARIPiprazole (ABILIFY) 5 MG tablet Take 1 tablet (5 mg total) by mouth daily. 30 tablet 4  . baclofen (LIORESAL) 10 MG tablet Take 1 tablet (10 mg total) by mouth 3 (three) times daily. 90 tablet  2  . carbamazepine (TEGRETOL) 200 MG tablet take 1 and 1/2 tablets by mouth twice a day 90 tablet 6  . cetirizine (ZYRTEC) 10 MG tablet Take by mouth.    . clonazePAM (KLONOPIN) 1 MG tablet Take 1 tablet (1 mg total) by mouth 4 (four) times daily as needed for anxiety. 120 tablet 4  . CRESTOR 20 MG tablet     .  DULoxetine (CYMBALTA) 60 MG capsule Take 2 capsules (120 mg total) by mouth daily. 60 capsule 4  . estradiol (ESTRACE) 1 MG tablet Take 1 tablet (1 mg total) by mouth daily. 30 tablet 12  . estrogens, conjugated, (PREMARIN) 0.625 MG tablet Take by mouth.    . fluticasone (FLONASE) 50 MCG/ACT nasal spray Place 2 sprays into both nostrils daily as needed (seasonal allergies).     . gabapentin (NEURONTIN) 800 MG tablet Take 1 tablet (800 mg total) by mouth 4 (four) times daily. 120 tablet 6  . losartan (COZAAR) 25 MG tablet Take by mouth.    . losartan-hydrochlorothiazide (HYZAAR) 50-12.5 MG tablet Take 1 tablet by mouth daily.    . meloxicam (MOBIC) 15 MG tablet     . olopatadine (PATANOL) 0.1 % ophthalmic solution     . omeprazole (PRILOSEC) 20 MG capsule Take 20 mg by mouth daily.     . pramipexole (MIRAPEX) 0.25 MG tablet take 1 tablet by mouth DURING THE DAY and 3 tablets AT NIGHT 120 tablet 6   No current facility-administered medications for this visit.    Medical Decision Making:  Established Problem, Worsening (2)  Treatment Plan Summary:Medication management     Patient has enough supply of Klonopin as she has recently filled her prescription on May 1. She failed 120 pills. I discussed with the patient that we will adjust her medications at her next appointment.  Continue Abilify as prescribed  Refilled Cymbalta 60 mg by mouth twice a day Refilled amitriptyline as prescribed  She will follow-up in a month or earlier depending on her symptoms   More than 50% of the time spent in psychoeducation, counseling and coordination of care.    This note was generated in part  or whole with voice recognition software. Voice regonition is usually quite accurate but there are transcription errors that can and very often do occur. I apologize for any typographical errors that were not detected and corrected.   Rainey Pines, MD  07/15/2015, 9:03 AM

## 2015-07-16 ENCOUNTER — Telehealth: Payer: Self-pay | Admitting: Neurology

## 2015-07-16 NOTE — Telephone Encounter (Signed)
I called patient. The patient missed her appointment on March 27, now wants a work in revisit. She has seen a psychiatrist he was talking about taking her off of several medications and seeing how she does. She gets carbamazepine, baclofen, and Mirapex through this office. If she comes off medications, she will have to be tapered slowly. We will see her in revisit.

## 2015-07-16 NOTE — Telephone Encounter (Signed)
Returned pt TC to offer work-in appt sometime next wk. Left VM mssg to call back and schedule if needed.

## 2015-07-16 NOTE — Telephone Encounter (Signed)
Patient is calling and states her new phychiatrist is going to take her off of all her medications so she would like to talk with Dr. Jannifer Franklin in that she has been on some of these for year.  She wanted an appt but of course there isn't one in the next few days.  Please call her.

## 2015-07-17 NOTE — Telephone Encounter (Signed)
Called pt back. Appt scheduled for Mon, 07/21/15 @ 3 p.m. Pt agreed to arrive 15 min prior to appt time.

## 2015-07-21 ENCOUNTER — Ambulatory Visit (INDEPENDENT_AMBULATORY_CARE_PROVIDER_SITE_OTHER): Payer: Medicare Other | Admitting: Neurology

## 2015-07-21 ENCOUNTER — Encounter: Payer: Self-pay | Admitting: Neurology

## 2015-07-21 VITALS — BP 128/84 | HR 80 | Ht 64.0 in | Wt 221.0 lb

## 2015-07-21 DIAGNOSIS — G8929 Other chronic pain: Secondary | ICD-10-CM | POA: Diagnosis not present

## 2015-07-21 DIAGNOSIS — Z5181 Encounter for therapeutic drug level monitoring: Secondary | ICD-10-CM | POA: Diagnosis not present

## 2015-07-21 DIAGNOSIS — M545 Low back pain: Secondary | ICD-10-CM | POA: Diagnosis not present

## 2015-07-21 DIAGNOSIS — G2581 Restless legs syndrome: Secondary | ICD-10-CM

## 2015-07-21 MED ORDER — CARBAMAZEPINE 200 MG PO TABS: ORAL_TABLET | ORAL | Status: DC

## 2015-07-21 MED ORDER — BACLOFEN 10 MG PO TABS: 10.0000 mg | ORAL_TABLET | Freq: Three times a day (TID) | ORAL | Status: DC

## 2015-07-21 MED ORDER — PRAMIPEXOLE DIHYDROCHLORIDE 0.25 MG PO TABS: ORAL_TABLET | ORAL | Status: DC

## 2015-07-21 NOTE — Progress Notes (Signed)
Reason for visit: Restless leg syndrome  Rachel Luna is an 60 y.o. female  History of present illness:  Rachel Luna is a 60 year old right-handed female with a history of restless leg syndrome that has been under relatively good control with the use of several medications including Mirapex. The patient is also on gabapentin which may be helping. She reports that she may have an occasional night when the restless legs is more severe, but in general she is sleeping well at night. She has a new psychiatrist, and the patient is upset that she may be taken off the some of her medications that includes Klonopin, Cymbalta, and gabapentin. The patient comes into the office today for an evaluation. Otherwise, no other new medical issues have come up since last seen.  Past Medical History  Diagnosis Date  . Hypertension   . Depression   . Bipolar disorder ([place])   . Arthritis   . Hyperlipidemia   . GERD (gastroesophageal reflux disease)   . Gout   . Fibroids   . chronic low back pain   . Obesity   . Chronic low back pain 02/18/2014    Past Surgical History  Procedure Laterality Date  . Spine surgery    . Knee arthroplasty    . Cholecystectomy    . Ovarian cyst removal    . Abdominal hysterectomy    . Foot surgery Left     Family History  Problem Relation Age of Onset  . Diabetes Mother   . Cancer Mother     breast  . Heart disease Mother   . Mental illness Mother     bipolar disorder  . Stroke Mother   . Diabetes Father   . Hypertension Father   . Diabetes Brother   . Bipolar disorder Brother   . Diabetes Brother     Social history:  reports that she has never smoked. She has never used smokeless tobacco. She reports that she does not drink alcohol or use illicit drugs.    Allergies  Allergen Reactions  . Lisinopril Cough  . Wellbutrin [Bupropion] Other (See Comments)    Irritability and Agitation  . Neupro [Rotigotine] Rash    Medications:  Prior to Admission  medications   Medication Sig Start Date End Date Taking? Authorizing Provider  amitriptyline (ELAVIL) 25 MG tablet Take 1 tablet (25 mg total) by mouth at bedtime. 07/15/15   Rainey Pines, MD  ARIPiprazole (ABILIFY) 5 MG tablet Take 1 tablet (5 mg total) by mouth daily. 07/15/15   Rainey Pines, MD  baclofen (LIORESAL) 10 MG tablet Take 1 tablet (10 mg total) by mouth 3 (three) times daily. 05/15/15   Kathrynn Ducking, MD  carbamazepine (TEGRETOL) 200 MG tablet take 1 and 1/2 tablets by mouth twice a day 09/08/14   Kathrynn Ducking, MD  cetirizine (ZYRTEC) 10 MG tablet Take by mouth. 03/12/13   Historical Provider, MD  clonazePAM (KLONOPIN) 1 MG tablet Take 1 tablet (1 mg total) by mouth 4 (four) times daily as needed for anxiety. 03/20/15   Marjie Skiff, MD  CRESTOR 20 MG tablet  10/23/14   Historical Provider, MD  DULoxetine (CYMBALTA) 60 MG capsule Take 2 capsules (120 mg total) by mouth daily. 07/15/15   Rainey Pines, MD  estradiol (ESTRACE) 1 MG tablet Take 1 tablet (1 mg total) by mouth daily. 08/06/14   Rachelle A Denney, CNM  estrogens, conjugated, (PREMARIN) 0.625 MG tablet Take by mouth.  Historical Provider, MD  fluticasone (FLONASE) 50 MCG/ACT nasal spray Place 2 sprays into both nostrils daily as needed (seasonal allergies).  08/08/12   Historical Provider, MD  gabapentin (NEURONTIN) 800 MG tablet Take 1 tablet (800 mg total) by mouth 4 (four) times daily. 07/15/15   Rainey Pines, MD  losartan (COZAAR) 25 MG tablet Take by mouth. 03/12/13   Historical Provider, MD  losartan-hydrochlorothiazide (HYZAAR) 50-12.5 MG tablet Take 1 tablet by mouth daily. 11/30/14   Historical Provider, MD  meloxicam (MOBIC) 15 MG tablet  07/04/15   Historical Provider, MD  olopatadine (PATANOL) 0.1 % ophthalmic solution     Historical Provider, MD  omeprazole (PRILOSEC) 20 MG capsule Take 20 mg by mouth daily.  07/11/12   Historical Provider, MD  pramipexole (MIRAPEX) 0.25 MG tablet take 1 tablet by mouth DURING THE DAY and 3  tablets AT NIGHT 07/15/15   Kathrynn Ducking, MD    ROS:  Out of a complete 14 system review of symptoms, the patient complains only of the following symptoms, and all other reviewed systems are negative.  Restless legs Anxiety, depression  Blood pressure 128/84, pulse 80, height 5\' 4"  (1.626 m), weight 221 lb (100.245 kg).  Physical Exam  General: The patient is alert and cooperative at the time of the examination. The patient is moderately to markedly obese.  Skin: No significant peripheral edema is noted.   Neurologic Exam  Mental status: The patient is alert and oriented x 3 at the time of the examination. The patient has apparent normal recent and remote memory, with an apparently normal attention span and concentration ability.   Cranial nerves: Facial symmetry is present. Speech is normal, no aphasia or dysarthria is noted. Extraocular movements are full. Visual fields are full.  Motor: The patient has good strength in all 4 extremities.  Sensory examination: Soft touch sensation is symmetric on the face, arms, and legs.  Coordination: The patient has good finger-nose-finger and heel-to-shin bilaterally.  Gait and station: The patient has a normal gait. Tandem gait is normal. Romberg is negative. No drift is seen.  Reflexes: Deep tendon reflexes are symmetric.   Assessment/Plan:  1. Restless leg syndrome  2. Anxiety and depression  The patient is doing relatively well at this time with the restless legs. Prescriptions were written for the carbamazepine, baclofen, and Mirapex. The patient will have blood work done today. She will follow-up in 6 months, sooner if needed.  Jill Alexanders MD 07/21/2015 7:04 PM  [facility] [address] [place], [place] [address]  Phone [phone] Fax [phone]

## 2015-07-22 ENCOUNTER — Telehealth: Payer: Self-pay

## 2015-07-22 LAB — COMPREHENSIVE METABOLIC PANEL
A/G RATIO: 1.9 (ref 1.2–2.2)
ALK PHOS: 123 IU/L — AB (ref 39–117)
ALT: 35 IU/L — ABNORMAL HIGH (ref 0–32)
AST: 39 IU/L (ref 0–40)
Albumin: 4.4 g/dL (ref 3.5–5.5)
BILIRUBIN TOTAL: 0.2 mg/dL (ref 0.0–1.2)
BUN/Creatinine Ratio: 16 (ref 9–23)
BUN: 10 mg/dL (ref 6–24)
CHLORIDE: 102 mmol/L (ref 96–106)
CO2: 27 mmol/L (ref 18–29)
Calcium: 9.5 mg/dL (ref 8.7–10.2)
Creatinine, Ser: 0.64 mg/dL (ref 0.57–1.00)
GFR calc Af Amer: 115 mL/min/{1.73_m2} (ref >59)
GFR calc non Af Amer: 99 mL/min/{1.73_m2} (ref >59)
GLOBULIN, TOTAL: 2.3 g/dL (ref 1.5–4.5)
Glucose: 108 mg/dL — ABNORMAL HIGH (ref 65–99)
POTASSIUM: 4.6 mmol/L (ref 3.5–5.2)
SODIUM: 147 mmol/L — AB (ref 134–144)
Total Protein: 6.7 g/dL (ref 6.0–8.5)

## 2015-07-22 LAB — CBC WITH DIFFERENTIAL/PLATELET
BASOS ABS: 0 10*3/uL (ref 0.0–0.2)
Basos: 1 %
EOS (ABSOLUTE): 0.3 10*3/uL (ref 0.0–0.4)
EOS: 5 %
HEMATOCRIT: 43 % (ref 34.0–46.6)
HEMOGLOBIN: 14.2 g/dL (ref 11.1–15.9)
IMMATURE GRANULOCYTES: 0 %
Immature Grans (Abs): 0 10*3/uL (ref 0.0–0.1)
LYMPHS ABS: 2.7 10*3/uL (ref 0.7–3.1)
LYMPHS: 36 %
MCH: 28.4 pg (ref 26.6–33.0)
MCHC: 33 g/dL (ref 31.5–35.7)
MCV: 86 fL (ref 79–97)
MONOCYTES: 8 %
Monocytes Absolute: 0.6 10*3/uL (ref 0.1–0.9)
NEUTROS PCT: 50 %
Neutrophils Absolute: 3.9 10*3/uL (ref 1.4–7.0)
Platelets: 223 10*3/uL (ref 150–379)
RBC: 5 x10E6/uL (ref 3.77–5.28)
RDW: 14.7 % (ref 12.3–15.4)
WBC: 7.5 10*3/uL (ref 3.4–10.8)

## 2015-07-22 LAB — CARBAMAZEPINE LEVEL, TOTAL: CARBAMAZEPINE LVL: 4.8 ug/mL (ref 4.0–12.0)

## 2015-07-22 NOTE — Telephone Encounter (Signed)
Called pt w/ lab results and to continue carbamazepine as before. Verbalized understanding and appreciation for call.

## 2015-07-22 NOTE — Telephone Encounter (Signed)
-----   Message from Kathrynn Ducking, MD sent at 07/22/2015 10:11 AM EDT ----- Blood work shows minimal elevation in sodium level, alkaline phosphatase level, and ALT. Likely not clinically significant, liver enzyme elevations related to carbamazepine. Carbamazepine level is therapeutic, blood count is normal. No change in dosing of medication. Please call the patient ----- Message -----    From: [facility] Lab Results In Interface    Sent: 07/22/2015   7:41 AM      To: Kathrynn Ducking, MD

## 2015-07-30 ENCOUNTER — Ambulatory Visit: Payer: 59 | Admitting: Psychiatry

## 2015-08-08 ENCOUNTER — Ambulatory Visit: Payer: Medicare Other

## 2015-08-12 ENCOUNTER — Ambulatory Visit
Admission: RE | Admit: 2015-08-12 | Discharge: 2015-08-12 | Disposition: A | Discharge status: Home / Self Care | Payer: Medicare Other | Source: Ambulatory Visit

## 2015-08-12 DIAGNOSIS — Z1231 Encounter for screening mammogram for malignant neoplasm of breast: Secondary | ICD-10-CM

## 2015-08-14 ENCOUNTER — Other Ambulatory Visit: Payer: Self-pay | Admitting: Primary Care

## 2015-08-14 ENCOUNTER — Telehealth: Payer: Self-pay | Admitting: *Deleted

## 2015-08-14 DIAGNOSIS — R928 Other abnormal and inconclusive findings on diagnostic imaging of breast: Secondary | ICD-10-CM

## 2015-08-14 NOTE — Telephone Encounter (Signed)
Entered in error

## 2015-08-15 ENCOUNTER — Encounter: Payer: Self-pay | Admitting: Psychiatry

## 2015-08-15 ENCOUNTER — Ambulatory Visit (INDEPENDENT_AMBULATORY_CARE_PROVIDER_SITE_OTHER): Payer: 59 | Admitting: Psychiatry

## 2015-08-15 VITALS — BP 96/66 | HR 60 | Temp 96.9°F | Ht 64.0 in | Wt 221.8 lb

## 2015-08-15 DIAGNOSIS — F3160 Bipolar disorder, current episode mixed, unspecified: Secondary | ICD-10-CM | POA: Diagnosis not present

## 2015-08-15 DIAGNOSIS — F411 Generalized anxiety disorder: Secondary | ICD-10-CM | POA: Diagnosis not present

## 2015-08-15 MED ORDER — AMITRIPTYLINE HCL 25 MG PO TABS: 25.0000 mg | ORAL_TABLET | Freq: Every day | ORAL | Status: DC

## 2015-08-15 MED ORDER — DULOXETINE HCL 30 MG PO CPEP: 90.0000 mg | ORAL_CAPSULE | Freq: Every day | ORAL | Status: DC

## 2015-08-15 MED ORDER — ARIPIPRAZOLE 5 MG PO TABS: 5.0000 mg | ORAL_TABLET | Freq: Every day | ORAL | Status: DC

## 2015-08-15 MED ORDER — CLONAZEPAM 1 MG PO TABS: 1.0000 mg | ORAL_TABLET | Freq: Two times a day (BID) | ORAL | Status: DC

## 2015-08-15 NOTE — Progress Notes (Signed)
Patient ID: Rachel Luna, female   DOB: 01/06/1956, 61 y.o.   MRN: [id number]  Pharmacy called and requested that Pt be allowed to take her Cymbalta split up, 30 mg and 60 mg. This way Pt can get her full dose without doing Authorization.

## 2015-08-15 NOTE — Progress Notes (Signed)
BH MD/PA/NP OP Progress Note  08/15/2015 10:04 AM Rachel Luna  MRN:  [id number]  Subjective:  Patient is a 60 year old female with history of bipolar disorder and benzodiazepine dependence who presented for follow-up appointment. She was following Dr. Jimmye Norman. Patient reported that she went to her physician yesterday and was found to have a breast mass. She is again going back for another evaluation as she has strong history of breast cancer. She appeared concerned about the same. Patient reported that she will not have any surgery chemotherapy or radiation therapy and will have the pain medication if she will find out about the breast cancer. She stated that she takes amitriptyline at bedtime to help her sleep. She is also taking Cymbalta 60 mg twice daily and Klonopin 4 times daily. Patient reported that she has been on Neurontin and Tegretol as prescribed by her neurologist due to history of seizures. Patient became irate and agitated as we were discussing about adjusting her medications. She reported that she does not want anything to be changed as she still has a prescription of Klonopin at the pharmacy and she never runs out of the prescription. She stated that she currently lives with a roommate and has been compliant with her medications. She reported that she previously was given the medications by Dr. Jimmye Norman and she would like to continue the same medications. She became agitated and was yelling during the interview when we were discussing about her medication changes and does not want to understand the interaction between her medications which can lead to seizures and serotonin syndrome.  . She reported that she has to take care of her father and is driving  between [place] and  [place] on a daily basis.  Chief Complaint:  Chief Complaint    Medication Refill; Agitation; Anxiety     Visit Diagnosis:     ICD-9-CM ICD-10-CM   1. Bipolar I disorder, most recent episode mixed ([place])  296.60 F31.60   2. Anxiety state 300.00 F41.1     Past Medical History:  Past Medical History  Diagnosis Date  . Hypertension   . Depression   . Bipolar disorder ([place])   . Arthritis   . Hyperlipidemia   . GERD (gastroesophageal reflux disease)   . Gout   . Fibroids   . chronic low back pain   . Obesity   . Chronic low back pain 02/18/2014    Past Surgical History  Procedure Laterality Date  . Spine surgery    . Knee arthroplasty    . Cholecystectomy    . Ovarian cyst removal    . Abdominal hysterectomy    . Foot surgery Left    Family History:  Family History  Problem Relation Age of Onset  . Diabetes Mother   . Cancer Mother     breast  . Heart disease Mother   . Mental illness Mother     bipolar disorder  . Stroke Mother   . Diabetes Father   . Hypertension Father   . Diabetes Brother   . Bipolar disorder Brother   . Diabetes Brother    Social History:  Social History   Social History  . Marital Status: Divorced    Spouse Name: N/A  . Number of Children: 0  . Years of Education: 10   Occupational History  .     Social History Main Topics  . Smoking status: Never Smoker   . Smokeless tobacco: Never Used  . Alcohol Use: No  .  Drug Use: No  . Sexual Activity: Not Currently   Other Topics Concern  . None   Social History Narrative   Patient is divorced and lives with a roommate.   Patient is on disability.   Patient has a 10th grade education.   Patient is right-handed.   Patient drinks 6 sodas daily.            Additional History:  Assessment:   Musculoskeletal: Strength & Muscle Tone: within normal limits Gait & Station: What slow but patient has her left foot/toe healing. Patient leans: N/A  Psychiatric Specialty Exam: Anxiety Patient reports no insomnia, nervous/anxious behavior or suicidal ideas.    Depression        Associated symptoms include does not have insomnia and no suicidal ideas.  Past medical history includes  anxiety.     Review of Systems  Psychiatric/Behavioral: Positive for depression (States she does has periods where she gets down when she thinks about her brother but otherwise feels her mood is been stable). Negative for suicidal ideas, hallucinations, memory loss and substance abuse. The patient is not nervous/anxious and does not have insomnia.   All other systems reviewed and are negative.   Blood pressure 96/66, pulse 60, temperature 96.9 F (36.1 C), height 5\' 4"  (1.626 m), weight 221 lb 12.8 oz (100.608 kg), SpO2 96 %.Body mass index is 38.05 kg/(m^2).  General Appearance: Well Groomed  Eye Contact:  Good  Speech:  Pressured  Volume:  Increased  Mood:  Good  Affect:  Labile   Thought Process:  Linear and Logical  Orientation:  Full (Time, Place, and Person)  Thought Content:  Negative  Suicidal Thoughts:  No  Homicidal Thoughts:  No  Memory:  Immediate;   Good Recent;   Good Remote;   Good  Judgement:  Good  Insight:  Good  Psychomotor Activity:  Negative  Concentration:  Good  Recall:  Good  Fund of Knowledge: Good  Language: Good  Akathisia:  Negative  Handed:  Right unknown   AIMS (if indicated):  Done today, normal  Assets:  Communication Skills Desire for Improvement Social Support  ADL's:  Intact  Cognition: WNL  Sleep:  poor   Is the patient at risk to self?  No. Has the patient been a risk to self in the past 6 months?  No. Has the patient been a risk to self within the distant past?  No. Is the patient a risk to others?  No. Has the patient been a risk to others in the past 6 months?  No. Has the patient been a risk to others within the distant past?  No.  Current Medications: Current Outpatient Prescriptions  Medication Sig Dispense Refill  . amitriptyline (ELAVIL) 25 MG tablet Take 1 tablet (25 mg total) by mouth at bedtime. 30 tablet 0  . ARIPiprazole (ABILIFY) 5 MG tablet Take 1 tablet (5 mg total) by mouth daily. 30 tablet 0  . baclofen  (LIORESAL) 10 MG tablet Take 1 tablet (10 mg total) by mouth 3 (three) times daily. 270 tablet 3  . carbamazepine (TEGRETOL) 200 MG tablet take 1 and 1/2 tablets by mouth twice a day 270 tablet 3  . cetirizine (ZYRTEC) 10 MG tablet Take by mouth.    . clonazePAM (KLONOPIN) 1 MG tablet Take 1 tablet (1 mg total) by mouth 4 (four) times daily as needed for anxiety. 120 tablet 4  . CRESTOR 20 MG tablet     . DULoxetine (CYMBALTA) 60  MG capsule Take 2 capsules (120 mg total) by mouth daily. 60 capsule 0  . estradiol (ESTRACE) 1 MG tablet Take 1 tablet (1 mg total) by mouth daily. 30 tablet 12  . estrogens, conjugated, (PREMARIN) 0.625 MG tablet Take by mouth.    . fluticasone (FLONASE) 50 MCG/ACT nasal spray Place 2 sprays into both nostrils daily as needed (seasonal allergies).     . gabapentin (NEURONTIN) 800 MG tablet Take 1 tablet (800 mg total) by mouth 4 (four) times daily. 120 tablet 6  . losartan-hydrochlorothiazide (HYZAAR) 50-12.5 MG tablet Take 1 tablet by mouth daily.    . meloxicam (MOBIC) 15 MG tablet     . omeprazole (PRILOSEC) 20 MG capsule Take 20 mg by mouth daily.     . pramipexole (MIRAPEX) 0.25 MG tablet take 1 tablet by mouth DURING THE DAY and 3 tablets AT NIGHT 360 tablet 3   No current facility-administered medications for this visit.    Medical Decision Making:  Established Problem, Worsening (2)  Treatment Plan Summary:Medication management     I will decrease the dose of Klonopin 1 mg by mouth twice a day. Will call the pharmacy and canceled her previous prescription of Klonopin as prescribed by Dr. Jimmye Norman  Continue Abilify 5 mg daily   Refilled Cymbalta 90 days.  Refilled amitriptyline as prescribed  She will follow-up in 6 weeks or earlier  depending on her symptoms   More than 50% of the time spent in psychoeducation, counseling and coordination of care.    This note was generated in part or whole with voice recognition software. Voice regonition is  usually quite accurate but there are transcription errors that can and very often do occur. I apologize for any typographical errors that were not detected and corrected.   Rainey Pines, MD  08/15/2015, 10:04 AM

## 2015-08-21 ENCOUNTER — Ambulatory Visit
Admission: RE | Admit: 2015-08-21 | Discharge: 2015-08-21 | Disposition: A | Discharge status: Home / Self Care | Payer: Medicare Other | Source: Ambulatory Visit | Attending: Primary Care | Admitting: Primary Care

## 2015-08-21 DIAGNOSIS — R928 Other abnormal and inconclusive findings on diagnostic imaging of breast: Secondary | ICD-10-CM

## 2015-08-31 ENCOUNTER — Other Ambulatory Visit: Payer: Self-pay | Admitting: Certified Nurse Midwife

## 2015-09-03 MED ORDER — NONFORMULARY OR COMPOUNDED ITEM: Status: DC

## 2015-09-03 NOTE — Telephone Encounter (Signed)
Helen faxed refill request for Antiinflammatory Cream, but our files show Peripheral Neuropathy cream was ordered.  [facility] states the Peripheral Neuropathy cream was not covered by [employer], but Authorized Subsititue Pain Dream Formulation 120 gram was a more affordable product for pt.  Dr. Jacqualyn Posey states refill +11. Faxed refill request to [facility].

## 2015-09-05 ENCOUNTER — Telehealth: Payer: Self-pay | Admitting: Psychiatry

## 2015-09-05 NOTE — Telephone Encounter (Signed)
Receive information from patient's therapist Miguel Dibble at [facility] regarding patient's condition that she is becoming more tearful sad and having changes in her energy level. She becoming more tearful depressed with irritability. I left a message for Otila Kluver to discuss her condition and also that if the patient wants to come for an earlier appointment. She was unable to discuss patient's condition at this time. LM at her office . Also advised that if patient wants to be seen for an earlier appointment, will be happy to do so.

## 2015-09-19 ENCOUNTER — Ambulatory Visit: Payer: 59 | Admitting: Psychiatry

## 2015-09-26 ENCOUNTER — Encounter: Payer: Self-pay | Admitting: Psychiatry

## 2015-09-26 ENCOUNTER — Ambulatory Visit (INDEPENDENT_AMBULATORY_CARE_PROVIDER_SITE_OTHER): Payer: 59 | Admitting: Psychiatry

## 2015-09-26 VITALS — BP 120/79 | HR 72 | Temp 98.7°F | Ht 64.0 in | Wt 215.4 lb

## 2015-09-26 DIAGNOSIS — F3160 Bipolar disorder, current episode mixed, unspecified: Secondary | ICD-10-CM

## 2015-09-26 DIAGNOSIS — F1394 Sedative, hypnotic or anxiolytic use, unspecified with sedative, hypnotic or anxiolytic-induced mood disorder: Secondary | ICD-10-CM

## 2015-09-26 MED ORDER — DULOXETINE HCL 60 MG PO CPEP: 60.0000 mg | ORAL_CAPSULE | Freq: Every day | ORAL | Status: DC

## 2015-09-26 MED ORDER — CARBAMAZEPINE 200 MG PO TABS: 100.0000 mg | ORAL_TABLET | Freq: Two times a day (BID) | ORAL | Status: DC

## 2015-09-26 MED ORDER — CLONAZEPAM 1 MG PO TABS: 1.0000 mg | ORAL_TABLET | Freq: Two times a day (BID) | ORAL | Status: DC

## 2015-09-26 MED ORDER — AMITRIPTYLINE HCL 25 MG PO TABS: 25.0000 mg | ORAL_TABLET | Freq: Every day | ORAL | Status: DC

## 2015-09-26 MED ORDER — ARIPIPRAZOLE 5 MG PO TABS: 5.0000 mg | ORAL_TABLET | Freq: Every day | ORAL | Status: DC

## 2015-09-26 MED ORDER — DULOXETINE HCL 30 MG PO CPEP: 30.0000 mg | ORAL_CAPSULE | Freq: Every day | ORAL | Status: DC

## 2015-09-26 NOTE — Progress Notes (Signed)
BH MD/PA/NP OP Progress Note  09/26/2015 9:25 AM Rachel Luna  MRN:  [id number]  Subjective:  Patient is a 60 year old female with history of bipolar disorder and benzodiazepine dependence who presented for follow-up appointment. She reported that she is feeling depressed due to the upcoming death anniversary of her mother and her brother. They both passed away on the same date on July 26. It should be 3 years for her mother and one for her brother. Her mother passed away due to stroke and her brother due to MI. Patient reported that she has noticed increase in her crying spells insomnia and auditory hallucinations. She reported that the auditory hallucinations are not  real as it is only in her father's house and other people are also having the same. She reported that it is probably due to ghosts in her father's house. She was reading a ghost book and brought it with her. She reported that she believes that there is some paranormal activity in her father's house as the mother and the brother were living in the same house. She believes in the same. She is also following with Miguel Dibble on a regular basis. She sees her every 3 weeks. She reported that she has been compliant with her medication and states  with her anxiety is not under control. However she is taking Klonopin twice daily as well as Tegretol twice daily. She is also on Cymbalta 90 mg daily. She appeared calm and collected during the interview. She does not appear apprehensive.   They're planning to do a big fire work this weekend to celebrate the lives of her family members. She currently denied having any suicidal homicidal ideations or plans.     Chief Complaint:  Chief Complaint    Follow-up; Medication Refill     Visit Diagnosis:     ICD-9-CM ICD-10-CM   1. Bipolar I disorder, most recent episode mixed ([place]) 296.60 F31.60   2. 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 Hypertension   . Depression   . Bipolar disorder ([place])   . Arthritis   . Hyperlipidemia   . GERD (gastroesophageal reflux disease)   . Gout   . Fibroids   . chronic low back pain   . Obesity   . Chronic low back pain 02/18/2014    Past Surgical History  Procedure Laterality Date  . Spine surgery    . Knee arthroplasty    . Cholecystectomy    . Ovarian cyst removal    . Abdominal hysterectomy    . Foot surgery Left    Family History:  Family History  Problem Relation Age of Onset  . Diabetes Mother   . Cancer Mother     breast  . Heart disease Mother   . Mental illness Mother     bipolar disorder  . Stroke Mother   . Diabetes Father   . Hypertension Father   . Diabetes Brother   . Bipolar disorder Brother   . Diabetes Brother    Social History:  Social History   Social History  . Marital Status: Divorced    Spouse Name: N/A  . Number of Children: 0  . Years of Education: 10   Occupational History  .     Social History Main Topics  . Smoking status: Never Smoker   . Smokeless tobacco: Never Used  .  Alcohol Use: No  . Drug Use: No  . Sexual Activity: Not Currently   Other Topics Concern  . None   Social History Narrative   Patient is divorced and lives with a roommate.   Patient is on disability.   Patient has a 10th grade education.   Patient is right-handed.   Patient drinks 6 sodas daily.            Additional History:  Assessment:   Musculoskeletal: Strength & Muscle Tone: within normal limits Gait & Station: What slow but patient has her left foot/toe healing. Patient leans: N/A  Psychiatric Specialty Exam: Anxiety Symptoms include nervous/anxious behavior. Patient reports no insomnia or suicidal ideas.    Depression        Associated symptoms include does not have insomnia and no suicidal ideas.  Past medical history includes anxiety.     Review of Systems   Psychiatric/Behavioral: Positive for depression (States she does has periods where she gets down when she thinks about her brother but otherwise feels her mood is been stable). Negative for suicidal ideas, hallucinations, memory loss and substance abuse. The patient is nervous/anxious. The patient does not have insomnia.   All other systems reviewed and are negative.   Blood pressure 120/79, pulse 72, temperature 98.7 F (37.1 C), temperature source Oral, height 5\' 4"  (1.626 m), weight 215 lb 6.4 oz (97.705 kg).Body mass index is 36.96 kg/(m^2).  General Appearance: Well Groomed  Eye Contact:  Good  Speech:  Clear and Coherent  Volume:  Increased  Mood:  Good  Affect:  Labile   Thought Process:  Linear and Logical  Orientation:  Full (Time, Place, and Person)  Thought Content:  Negative  Suicidal Thoughts:  No  Homicidal Thoughts:  No  Memory:  Immediate;   Good Recent;   Good Remote;   Good  Judgement:  Good  Insight:  Good  Psychomotor Activity:  Negative  Concentration:  Good  Recall:  Good  Fund of Knowledge: Good  Language: Good  Akathisia:  Negative  Handed:  Right unknown   AIMS (if indicated):  Done today, normal  Assets:  Communication Skills Desire for Improvement Social Support  ADL's:  Intact  Cognition: WNL  Sleep:  poor   Is the patient at risk to self?  No. Has the patient been a risk to self in the past 6 months?  No. Has the patient been a risk to self within the distant past?  No. Is the patient a risk to others?  No. Has the patient been a risk to others in the past 6 months?  No. Has the patient been a risk to others within the distant past?  No.  Current Medications: Current Outpatient Prescriptions  Medication Sig Dispense Refill  . amitriptyline (ELAVIL) 25 MG tablet Take 1 tablet (25 mg total) by mouth at bedtime. 30 tablet 1  . ARIPiprazole (ABILIFY) 5 MG tablet Take 1 tablet (5 mg total) by mouth daily. 30 tablet 1  . baclofen (LIORESAL) 10  MG tablet Take 1 tablet (10 mg total) by mouth 3 (three) times daily. 270 tablet 3  . carbamazepine (TEGRETOL) 200 MG tablet Take 0.5 tablets (100 mg total) by mouth 2 (two) times daily. 180 tablet 1  . cetirizine (ZYRTEC) 10 MG tablet Take by mouth.    . clonazePAM (KLONOPIN) 1 MG tablet Take 1 tablet (1 mg total) by mouth 2 (two) times daily. 60 tablet 1  . CRESTOR 20 MG tablet     .  DULoxetine (CYMBALTA) 30 MG capsule Take 1 capsule (30 mg total) by mouth daily. 90 capsule 1  . DULoxetine (CYMBALTA) 60 MG capsule Take 1 capsule (60 mg total) by mouth daily. 90 capsule 1  . estradiol (ESTRACE) 1 MG tablet take 1 tablet by mouth once daily 30 tablet 12  . estrogens, conjugated, (PREMARIN) 0.625 MG tablet Take by mouth.    . fluticasone (FLONASE) 50 MCG/ACT nasal spray Place 2 sprays into both nostrils daily as needed (seasonal allergies).     . gabapentin (NEURONTIN) 800 MG tablet Take 1 tablet (800 mg total) by mouth 4 (four) times daily. 120 tablet 6  . losartan-hydrochlorothiazide (HYZAAR) 50-12.5 MG tablet Take 1 tablet by mouth daily.    . meloxicam (MOBIC) 15 MG tablet     . NONFORMULARY OR COMPOUNDED ITEM [facility]:  Authorized Substitute Pain Cream Formulation - Ibuprofen 15%, Baclofen 1%, Gabapentin 3%, Lidocaine 2%, apply 1-2 grams to affected area 3-4 times daily. 120 each 11  . omeprazole (PRILOSEC) 20 MG capsule Take 20 mg by mouth daily.     . pramipexole (MIRAPEX) 0.25 MG tablet take 1 tablet by mouth DURING THE DAY and 3 tablets AT NIGHT 360 tablet 3   No current facility-administered medications for this visit.    Medical Decision Making:  Established Problem, Worsening (2)  Treatment Plan Summary:Medication management     Continue  Klonopin 1 mg by mouth twice a day.  Continue Abilify 5 mg daily   Refilled Cymbalta 30mg  and 60mg  for  90 day supply. Refilled amitriptyline 25mg  po qhs for 90 day supply.  Decrease Tegretol 100 mg by mouth twice a day. She reported  that she has been taking it for the seizure disorder. I will order labs at her next appointment.  She will follow-up in 4 weeks or earlier  depending on her symptoms   More than 50% of the time spent in psychoeducation, counseling and coordination of care.    This note was generated in part or whole with voice recognition software. Voice regonition is usually quite accurate but there are transcription errors that can and very often do occur. I apologize for any typographical errors that were not detected and corrected.   Rainey Pines, MD  09/26/2015, 9:25 AM

## 2015-10-06 ENCOUNTER — Telehealth: Payer: Self-pay | Admitting: Neurology

## 2015-10-06 NOTE — Telephone Encounter (Signed)
I called the patient. The patient is being followed through psychiatry area there trying to get her down off of the carbamazepine which is fine, this was used for the left foot pain. She is having some problems of feeling tremulous, I am not sure this is related to the car maze been taper but if it is it is likely be transient in nature. The patient is to continue with the carbamazepine taper. The patient went from taking 3 tablets a day of the 200 mg to only one daily. The rate of the taper likely was somewhat rapid.

## 2015-10-06 NOTE — Telephone Encounter (Signed)
Pt called said her psychiatrist has decreased the tegretol dose to 2 day. She said at the last visit that provider told her she is wanting to take her completely off the tegretol. Pt is concerned that she will have a seizure. Pt said she has started to shake.  Please call

## 2015-10-10 ENCOUNTER — Other Ambulatory Visit: Payer: Self-pay

## 2015-10-10 MED ORDER — NONFORMULARY OR COMPOUNDED ITEM: 180.0000 g | Freq: Four times a day (QID) | 3 refills | Status: DC

## 2015-10-23 ENCOUNTER — Ambulatory Visit: Payer: Self-pay | Admitting: Psychiatry

## 2015-11-18 ENCOUNTER — Ambulatory Visit: Payer: Medicare Other | Admitting: Certified Nurse Midwife

## 2015-11-19 ENCOUNTER — Ambulatory Visit (INDEPENDENT_AMBULATORY_CARE_PROVIDER_SITE_OTHER): Payer: 59 | Admitting: Psychiatry

## 2015-11-19 ENCOUNTER — Telehealth: Payer: Self-pay

## 2015-11-19 ENCOUNTER — Encounter: Payer: Self-pay | Admitting: Psychiatry

## 2015-11-19 VITALS — BP 118/77 | HR 69 | Temp 97.6°F | Ht 64.0 in | Wt 220.4 lb

## 2015-11-19 DIAGNOSIS — F1394 Sedative, hypnotic or anxiolytic use, unspecified with sedative, hypnotic or anxiolytic-induced mood disorder: Secondary | ICD-10-CM | POA: Diagnosis not present

## 2015-11-19 DIAGNOSIS — F3160 Bipolar disorder, current episode mixed, unspecified: Secondary | ICD-10-CM

## 2015-11-19 MED ORDER — GABAPENTIN 800 MG PO TABS: 800.0000 mg | ORAL_TABLET | Freq: Four times a day (QID) | ORAL | 6 refills | Status: DC

## 2015-11-19 MED ORDER — AMITRIPTYLINE HCL 25 MG PO TABS: 25.0000 mg | ORAL_TABLET | Freq: Every day | ORAL | 3 refills | Status: DC

## 2015-11-19 MED ORDER — CLONAZEPAM 0.5 MG PO TABS: 0.5000 mg | ORAL_TABLET | Freq: Two times a day (BID) | ORAL | 2 refills | Status: DC

## 2015-11-19 MED ORDER — DULOXETINE HCL 60 MG PO CPEP: 60.0000 mg | ORAL_CAPSULE | Freq: Every day | ORAL | 1 refills | Status: DC

## 2015-11-19 MED ORDER — DULOXETINE HCL 30 MG PO CPEP: 30.0000 mg | ORAL_CAPSULE | Freq: Every day | ORAL | 1 refills | Status: DC

## 2015-11-19 MED ORDER — ARIPIPRAZOLE 5 MG PO TABS: 5.0000 mg | ORAL_TABLET | Freq: Every day | ORAL | 2 refills | Status: DC

## 2015-11-19 NOTE — Progress Notes (Signed)
BH MD/PA/NP OP Progress Note  11/19/2015 8:47 AM Rachel Luna  MRN:  [id number]  Subjective:  Patient is a 60 year old female with history of bipolar disorder and benzodiazepine dependence who presented for follow-up appointment. She reported that she is sleeping almost all day long as she is currently on disability. She reported that she lives with a roommate and he is also on disability and he sleeps all day long. She reported that she has nothing to do. [place] and it is [place]. She eats microwavable foods. Patient is not agreeable to changing her lifestyle as she reported that she does not want to go out to meet people as she will lose her temper. She feels stable on her current medications. We decrease the dose of Tegretol last time and she feels that the dosage is fine. She takes her medications as prescribed. Patient reported that she does not want to decrease the dose of amitriptyline at this time as she thinks that she will not be able to sleep at night. She appears calm and stable during the interview. She denied having any suicidal ideations or plans.  She reported that she is enjoying sleeping for the whole day and thinks that it has been helping her. She feels that if she would not state that she will lose her temper and will get agitated and mad quickly. I discussed with her about physical  activity but she is not interested at this time.  She stated that she had her labs done couple of months ago at her primary care physician office and has an appointment coming up next month. She will bring a copy of labs at her next appointment.     Chief Complaint:  Chief Complaint    Follow-up; Medication Refill     Visit Diagnosis:   No diagnosis found.  Past Medical History:  Past Medical History:  Diagnosis Date  . Arthritis   . Bipolar disorder ([place])   . chronic low back pain   . Chronic low back pain 02/18/2014  . Depression   . Fibroids   . GERD  (gastroesophageal reflux disease)   . Gout   . Hyperlipidemia   . Hypertension   . Obesity     Past Surgical History:  Procedure Laterality Date  . ABDOMINAL HYSTERECTOMY    . CHOLECYSTECTOMY    . FOOT SURGERY Left   . KNEE ARTHROPLASTY    . OVARIAN CYST REMOVAL    . SPINE SURGERY     Family History:  Family History  Problem Relation Age of Onset  . Diabetes Mother   . Cancer Mother     breast  . Heart disease Mother   . Mental illness Mother     bipolar disorder  . Stroke Mother   . Diabetes Father   . Hypertension Father   . Diabetes Brother   . Bipolar disorder Brother   . Diabetes Brother    Social History:  Social History   Social History  . Marital status: Divorced    Spouse name: N/A  . Number of children: 0  . Years of education: 10   Occupational History  .  Unemployed   Social History Main Topics  . Smoking status: Never Smoker  . Smokeless tobacco: Never Used  . Alcohol use No  . Drug use: No  . Sexual activity: Not Currently   Other Topics Concern  . None   Social History Narrative   Patient is divorced  and lives with a roommate.   Patient is on disability.   Patient has a 10th grade education.   Patient is right-handed.   Patient drinks 6 sodas daily.            Additional History:  Assessment:   Musculoskeletal: Strength & Muscle Tone: within normal limits Gait & Station: What slow but patient has her left foot/toe healing. Patient leans: N/A  Psychiatric Specialty Exam: Anxiety  Symptoms include nervous/anxious behavior. Patient reports no insomnia or suicidal ideas.    Depression         Associated symptoms include does not have insomnia and no suicidal ideas.  Past medical history includes anxiety.   Medication Refill     Review of Systems  Psychiatric/Behavioral: Negative for hallucinations, memory loss, substance abuse and suicidal ideas. Depression: States she does has periods where she gets down when she thinks  about her brother but otherwise feels her mood is been stable. The patient is nervous/anxious. The patient does not have insomnia.   All other systems reviewed and are negative.   Blood pressure 118/77, pulse 69, temperature 97.6 F (36.4 C), temperature source Oral, height 5\' 4"  (1.626 m), weight 220 lb 6.4 oz (100 kg).Body mass index is 37.83 kg/m.  General Appearance: Well Groomed  Eye Contact:  Good  Speech:  Clear and Coherent  Volume:  Increased  Mood:  Depressed  Affect:  Congruent and Labile   Thought Process:  Linear and Logical  Orientation:  Full (Time, Place, and Person)  Thought Content:  Negative  Suicidal Thoughts:  No  Homicidal Thoughts:  No  Memory:  Immediate;   Good Recent;   Good Remote;   Good  Judgement:  Good  Insight:  Good  Psychomotor Activity:  Negative  Concentration:  Good  Recall:  Good  Fund of Knowledge: Good  Language: Good  Akathisia:  Negative  Handed:  Right unknown   AIMS (if indicated):  Done today, normal  Assets:  Communication Skills Desire for Improvement Social Support  ADL's:  Intact  Cognition: WNL  Sleep:  poor   Is the patient at risk to self?  No. Has the patient been a risk to self in the past 6 months?  No. Has the patient been a risk to self within the distant past?  No. Is the patient a risk to others?  No. Has the patient been a risk to others in the past 6 months?  No. Has the patient been a risk to others within the distant past?  No.  Current Medications: Current Outpatient Prescriptions  Medication Sig Dispense Refill  . amitriptyline (ELAVIL) 25 MG tablet Take 1 tablet (25 mg total) by mouth at bedtime. 30 tablet 1  . ARIPiprazole (ABILIFY) 5 MG tablet Take 1 tablet (5 mg total) by mouth daily. 30 tablet 1  . baclofen (LIORESAL) 10 MG tablet Take 1 tablet (10 mg total) by mouth 3 (three) times daily. 270 tablet 3  . carbamazepine (TEGRETOL) 200 MG tablet Take 0.5 tablets (100 mg total) by mouth 2 (two) times  daily. 180 tablet 1  . cetirizine (ZYRTEC) 10 MG tablet Take by mouth.    . clonazePAM (KLONOPIN) 1 MG tablet Take 1 tablet (1 mg total) by mouth 2 (two) times daily. 60 tablet 1  . CRESTOR 20 MG tablet     . DULoxetine (CYMBALTA) 30 MG capsule Take 1 capsule (30 mg total) by mouth daily. 90 capsule 1  . DULoxetine (CYMBALTA) 60  MG capsule Take 1 capsule (60 mg total) by mouth daily. 90 capsule 1  . estradiol (ESTRACE) 1 MG tablet take 1 tablet by mouth once daily 30 tablet 12  . estrogens, conjugated, (PREMARIN) 0.625 MG tablet Take by mouth.    . fluticasone (FLONASE) 50 MCG/ACT nasal spray Place 2 sprays into both nostrils daily as needed (seasonal allergies).     . gabapentin (NEURONTIN) 800 MG tablet Take 1 tablet (800 mg total) by mouth 4 (four) times daily. 120 tablet 6  . losartan-hydrochlorothiazide (HYZAAR) 50-12.5 MG tablet Take 1 tablet by mouth daily.    . meloxicam (MOBIC) 15 MG tablet     . NONFORMULARY OR COMPOUNDED ITEM [facility]:  Authorized Substitute Pain Cream Formulation - Ibuprofen 15%, Baclofen 1%, Gabapentin 3%, Lidocaine 2%, apply 1-2 grams to affected area 3-4 times daily. 120 each 11  . NONFORMULARY OR COMPOUNDED ITEM Apply 180 g topically 4 (four) times daily. Diclofenac 3%, Baclofen 2%, Cyclobenzaprine 2%, Lidocaine 2% 180 each 3  . omeprazole (PRILOSEC) 20 MG capsule Take 20 mg by mouth daily.     . pramipexole (MIRAPEX) 0.25 MG tablet take 1 tablet by mouth DURING THE DAY and 3 tablets AT NIGHT 360 tablet 3   No current facility-administered medications for this visit.     Medical Decision Making:  Established Problem, Worsening (2)  Treatment Plan Summary:Medication management     Decrease   Klonopin 0.5 mg by mouth twice a day to decrease sedation. Continue Abilify 5 mg daily   Refilled Cymbalta 30mg  and 60mg  for  90 day supply. Refilled amitriptyline 25mg  po qhs for 90 day supply.  Continue  Tegretol 100 mg by mouth twice a day. She reported  that she has been taking it for the seizure disorder. Patient will have her labs done at her PCP office.  She will follow-up in 3 months or earlier  depending on her symptoms   More than 50% of the time spent in psychoeducation, counseling and coordination of care.    This note was generated in part or whole with voice recognition software. Voice regonition is usually quite accurate but there are transcription errors that can and very often do occur. I apologize for any typographical errors that were not detected and corrected.   Rainey Pines, MD  11/19/2015, 8:47 AM

## 2015-11-19 NOTE — Telephone Encounter (Signed)
faxed and confirmed rx for klonopin .5mg  id # [id number] order [id number]

## 2015-11-19 NOTE — Telephone Encounter (Signed)
done

## 2015-12-17 IMAGING — DX DG FOOT COMPLETE 3+V*L*
3 series · 3 of 3 positions shown · non-contrast
Comparison: 07/06/2014

CLINICAL DATA: Midfoot dorsal postop erythema, pain.

EXAM:
LEFT FOOT - COMPLETE 3+ VIEW

[foot ap]
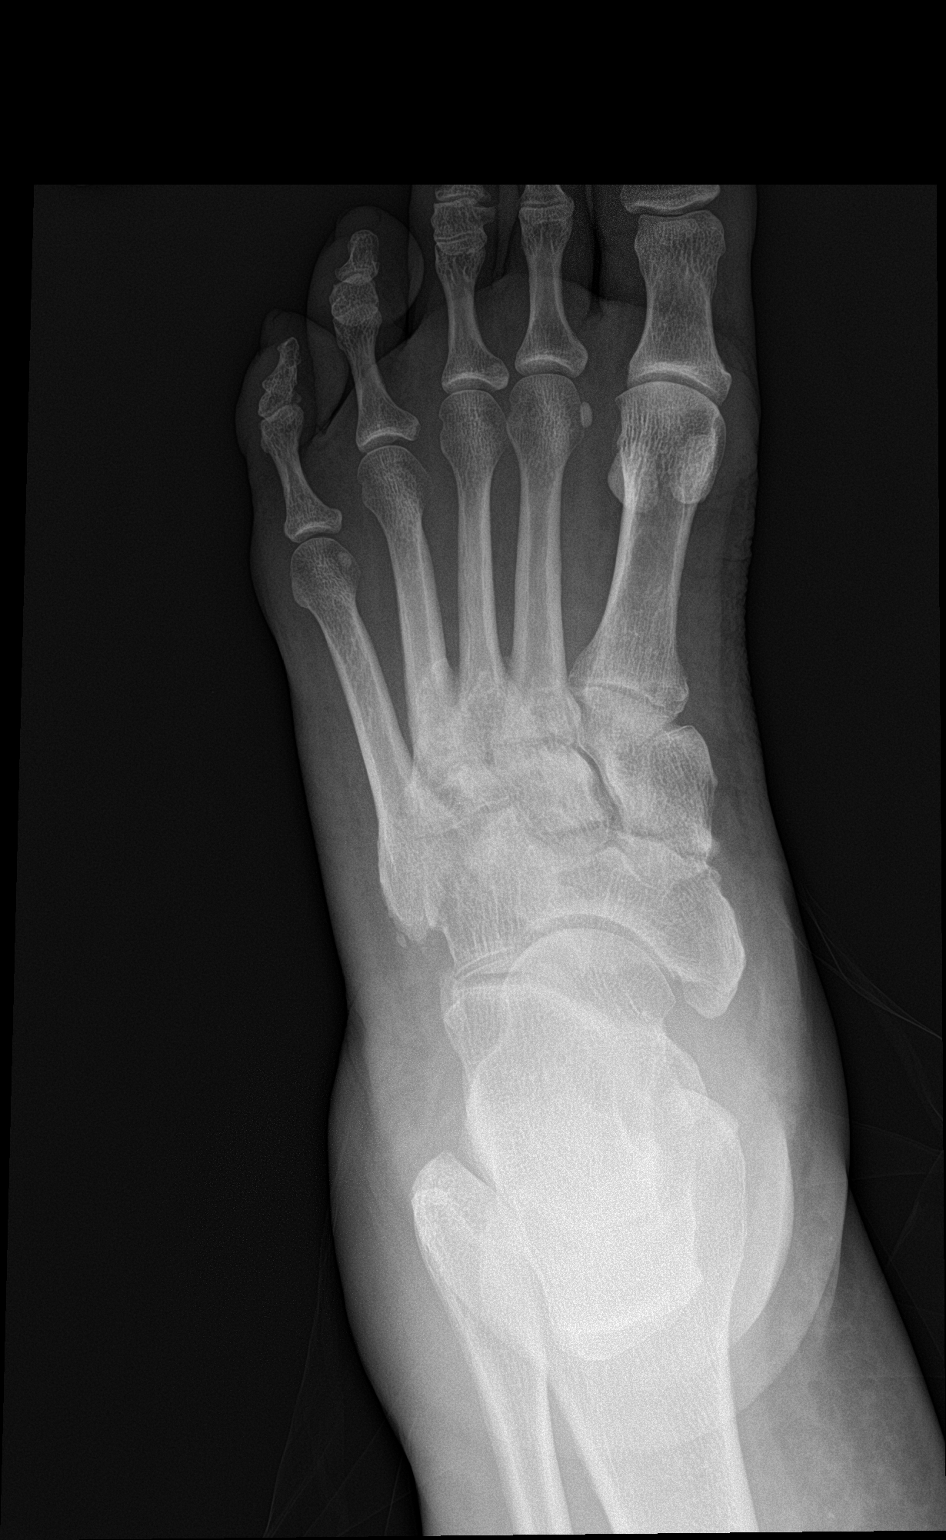

[foot obl]
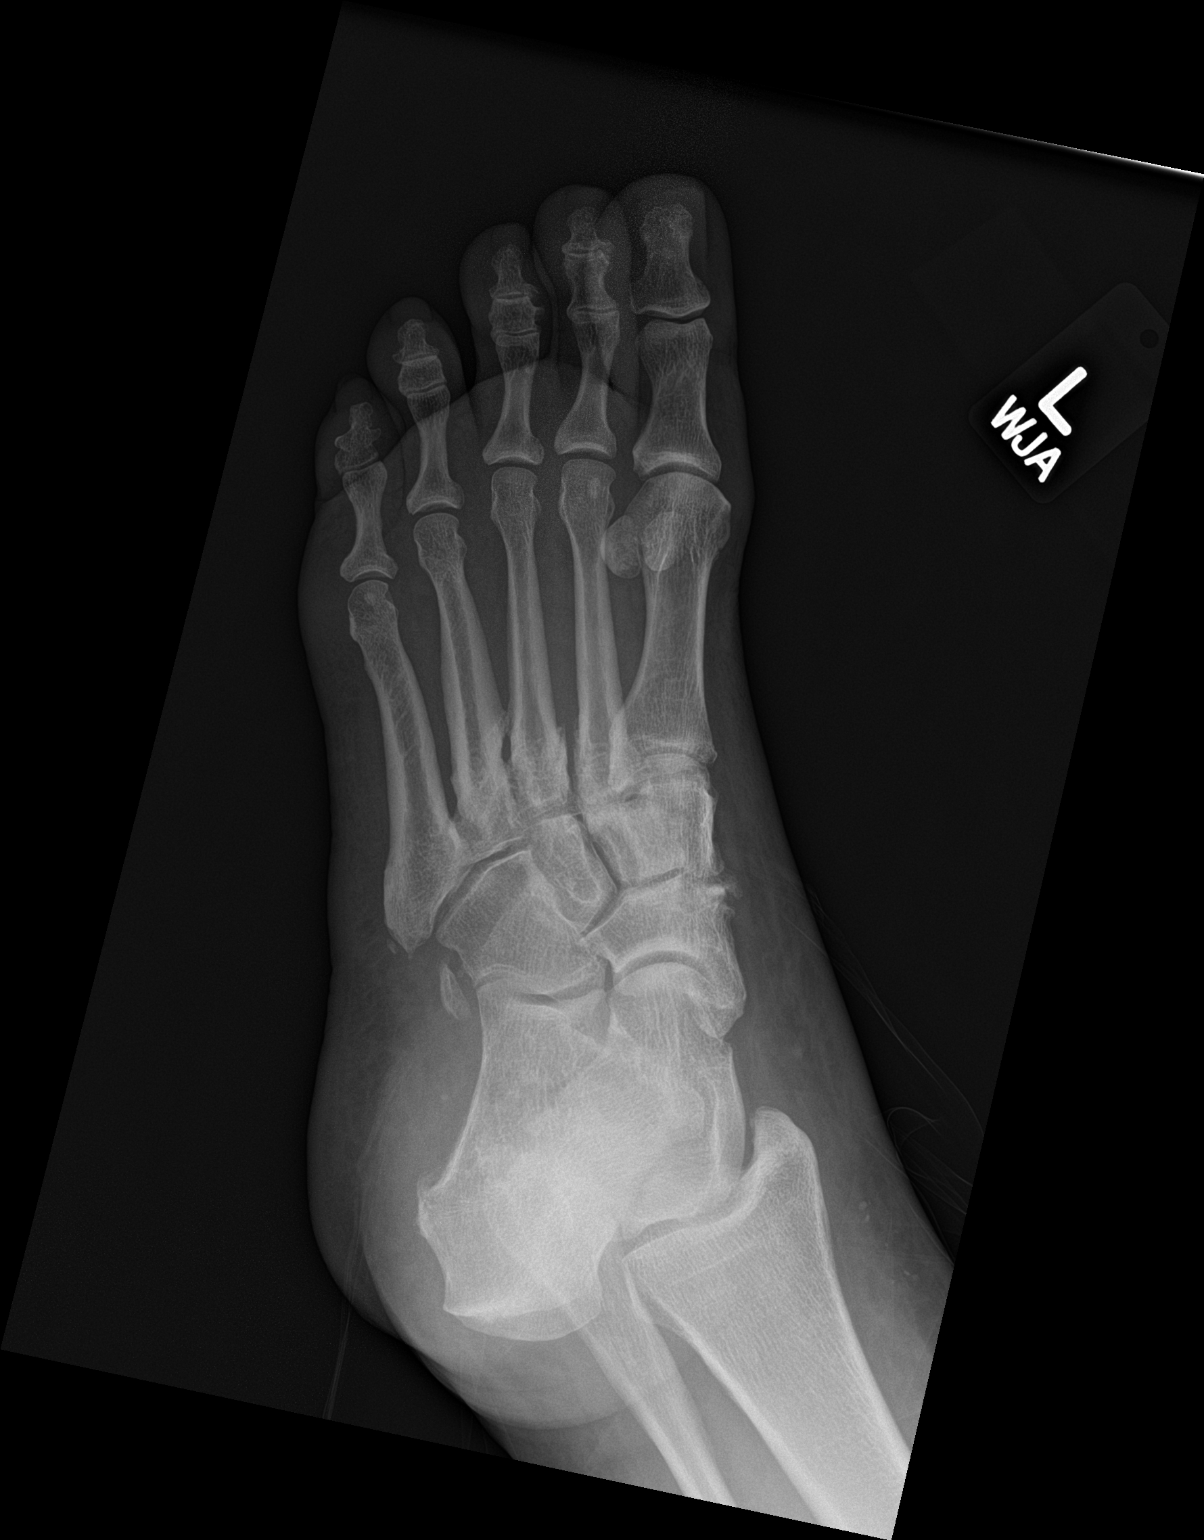

[foot lat]
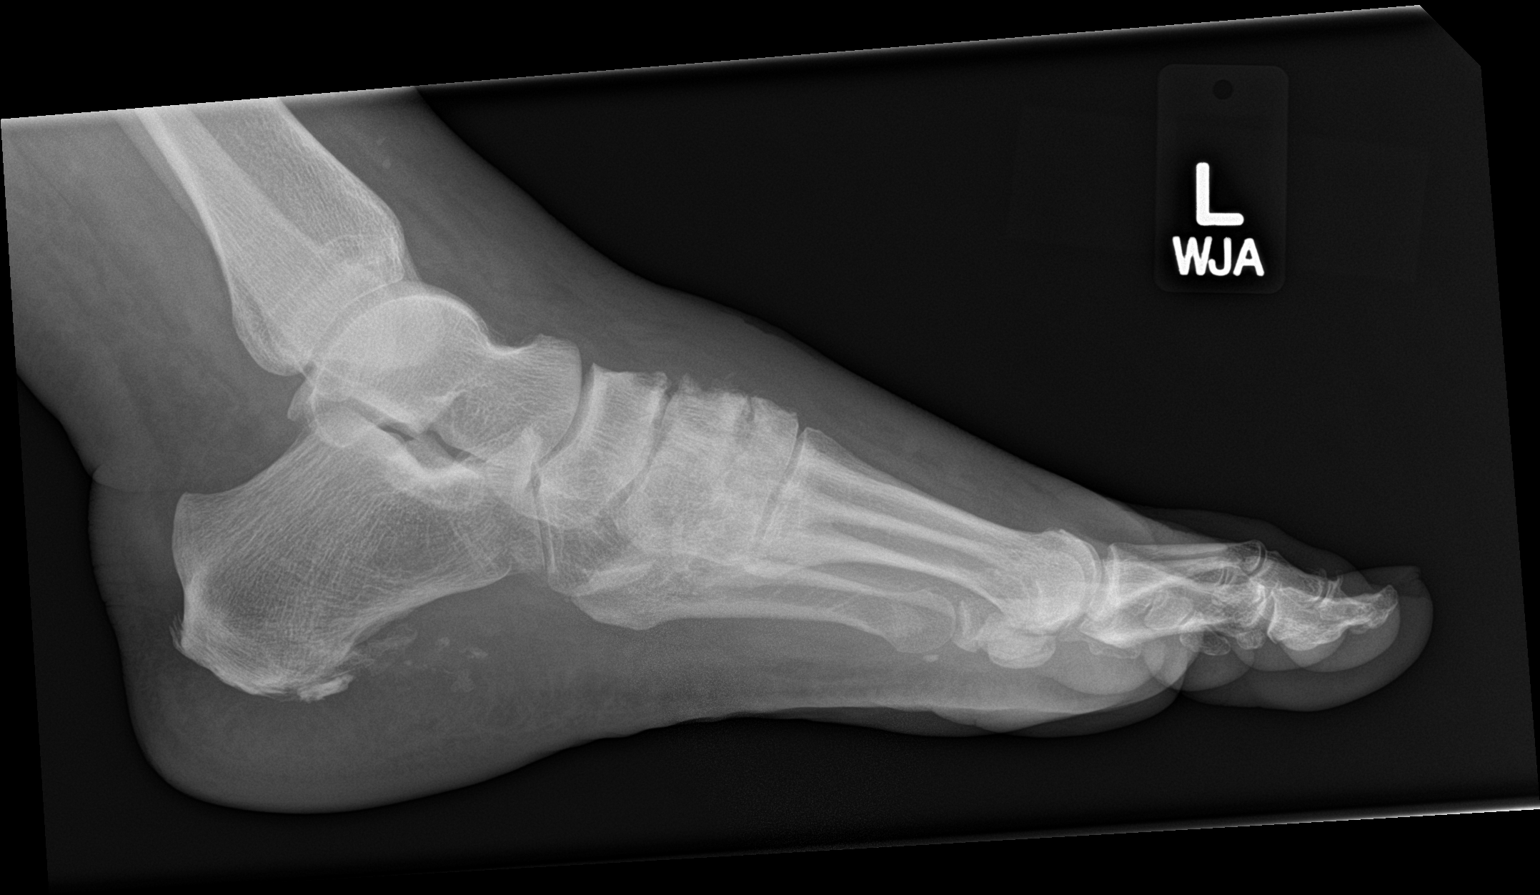

[3 of 3 positions shown; findings below may reference images not displayed]

FINDINGS: Soft tissue swelling along the dorsum of the foot. Degenerative
changes in the midfoot. No acute focal bony abnormality or change
since prior study. Plantar calcaneal spur again noted with coarse
calcifications in the plantar aponeurosis as before.
IMPRESSION: No acute bony abnormality.  No change since prior study.

## 2016-01-21 ENCOUNTER — Encounter: Payer: Self-pay | Admitting: Adult Health

## 2016-01-21 ENCOUNTER — Ambulatory Visit: Payer: Self-pay | Admitting: Certified Nurse Midwife

## 2016-01-21 ENCOUNTER — Ambulatory Visit (INDEPENDENT_AMBULATORY_CARE_PROVIDER_SITE_OTHER): Payer: Medicare Other | Admitting: Adult Health

## 2016-01-21 DIAGNOSIS — R251 Tremor, unspecified: Secondary | ICD-10-CM | POA: Diagnosis not present

## 2016-01-21 DIAGNOSIS — G8929 Other chronic pain: Secondary | ICD-10-CM | POA: Diagnosis not present

## 2016-01-21 DIAGNOSIS — Z5181 Encounter for therapeutic drug level monitoring: Secondary | ICD-10-CM | POA: Diagnosis not present

## 2016-01-21 DIAGNOSIS — M542 Cervicalgia: Secondary | ICD-10-CM

## 2016-01-21 DIAGNOSIS — G2581 Restless legs syndrome: Secondary | ICD-10-CM

## 2016-01-21 DIAGNOSIS — M545 Low back pain: Secondary | ICD-10-CM

## 2016-01-21 NOTE — Progress Notes (Signed)
PATIENT: Rachel Luna DOB: Feb 04, 1956  REASON FOR VISIT: follow up HISTORY FROM: patient  HISTORY OF PRESENT ILLNESS: Rachel Luna is a 60 year old female with a history of restless leg syndrome and chronic back pain. She returns today for follow-up. She is currently on Mirapex which offers good relief of her restless legs. She is also on amitriptyline, baclofen, carbamazepine, Klonopin, Cymbalta, gabapentin all of these are now prescribed by her psychiatrist. She states that her psychiatrist did decrease her carbamazepine level. She reports that she gets good control with Mirapex of her restless leg. She states that she does sleep okay. Reports that her back pain is tolerable. She is now reporting a new symptom of head tremors with pain in the neck radiating to the right shoulder. She also reports right arm weakness. Denies any numbness or tingling down the right arm. She states this started approximately 2 weeks ago. There was no precipitating events that caused the pain. Denies fall or trauma. She has not followed up with her primary care in regards to this new symptoms. She returns today for an evaluation.   HISTORY Rachel Luna is a 60 year old right-handed female with a history of restless leg syndrome that has been under relatively good control with the use of several medications including Mirapex. The patient is also on gabapentin which may be helping. She reports that she may have an occasional night when the restless legs is more severe, but in general she is sleeping well at night. She has a new psychiatrist, and the patient is upset that she may be taken off the some of her medications that includes Klonopin, Cymbalta, and gabapentin. The patient comes into the office today for an evaluation. Otherwise, no other new medical issues have come up since last seen  REVIEW OF SYSTEMS: Out of a complete 14 system review of symptoms, the patient complains only of the following symptoms, and all  other reviewed systems are negative.  Restless leg, insomnia, frequent waking, joint pain, back pain, aching muscles, muscle cramps, neck pain, , Tremors, agitation  ALLERGIES: Allergies  Allergen Reactions  . Lisinopril Cough  . Wellbutrin [Bupropion] Other (See Comments)    Irritability and Agitation  . Neupro [Rotigotine] Rash    HOME MEDICATIONS: Outpatient Medications Prior to Visit  Medication Sig Dispense Refill  . amitriptyline (ELAVIL) 25 MG tablet Take 1 tablet (25 mg total) by mouth at bedtime. 30 tablet 3  . ARIPiprazole (ABILIFY) 5 MG tablet Take 1 tablet (5 mg total) by mouth daily. 30 tablet 2  . baclofen (LIORESAL) 10 MG tablet Take 1 tablet (10 mg total) by mouth 3 (three) times daily. 270 tablet 3  . carbamazepine (TEGRETOL) 200 MG tablet Take 0.5 tablets (100 mg total) by mouth 2 (two) times daily. 180 tablet 1  . cetirizine (ZYRTEC) 10 MG tablet Take by mouth.    . clonazePAM (KLONOPIN) 0.5 MG tablet Take 1 tablet (0.5 mg total) by mouth 2 (two) times daily. 60 tablet 2  . CRESTOR 20 MG tablet     . DULoxetine (CYMBALTA) 30 MG capsule Take 1 capsule (30 mg total) by mouth daily. 90 capsule 1  . DULoxetine (CYMBALTA) 60 MG capsule Take 1 capsule (60 mg total) by mouth daily. 90 capsule 1  . estradiol (ESTRACE) 1 MG tablet take 1 tablet by mouth once daily 30 tablet 12  . estrogens, conjugated, (PREMARIN) 0.625 MG tablet Take by mouth.    . fluticasone (FLONASE) 50 MCG/ACT nasal spray Place  2 sprays into both nostrils daily as needed (seasonal allergies).     . gabapentin (NEURONTIN) 800 MG tablet Take 1 tablet (800 mg total) by mouth 4 (four) times daily. 120 tablet 6  . losartan-hydrochlorothiazide (HYZAAR) 50-12.5 MG tablet Take 1 tablet by mouth daily.    . meloxicam (MOBIC) 15 MG tablet     . NONFORMULARY OR COMPOUNDED ITEM [facility]:  Authorized Substitute Pain Cream Formulation - Ibuprofen 15%, Baclofen 1%, Gabapentin 3%, Lidocaine 2%, apply 1-2 grams  to affected area 3-4 times daily. 120 each 11  . NONFORMULARY OR COMPOUNDED ITEM Apply 180 g topically 4 (four) times daily. Diclofenac 3%, Baclofen 2%, Cyclobenzaprine 2%, Lidocaine 2% 180 each 3  . omeprazole (PRILOSEC) 20 MG capsule Take 20 mg by mouth daily.     . pramipexole (MIRAPEX) 0.25 MG tablet take 1 tablet by mouth DURING THE DAY and 3 tablets AT NIGHT 360 tablet 3   No facility-administered medications prior to visit.     PAST MEDICAL HISTORY: Past Medical History:  Diagnosis Date  . Arthritis   . Bipolar disorder ([place])   . chronic low back pain   . Chronic low back pain 02/18/2014  . Depression   . Fibroids   . GERD (gastroesophageal reflux disease)   . Gout   . Hyperlipidemia   . Hypertension   . Obesity     PAST SURGICAL HISTORY: Past Surgical History:  Procedure Laterality Date  . ABDOMINAL HYSTERECTOMY    . CHOLECYSTECTOMY    . FOOT SURGERY Left   . KNEE ARTHROPLASTY    . OVARIAN CYST REMOVAL    . SPINE SURGERY      FAMILY HISTORY: Family History  Problem Relation Age of Onset  . Diabetes Mother   . Cancer Mother     breast  . Heart disease Mother   . Mental illness Mother     bipolar disorder  . Stroke Mother   . Diabetes Father   . Hypertension Father   . Diabetes Brother   . Bipolar disorder Brother   . Diabetes Brother     SOCIAL HISTORY: Social History   Social History  . Marital status: Divorced    Spouse name: N/A  . Number of children: 0  . Years of education: 10   Occupational History  .  Unemployed   Social History Main Topics  . Smoking status: Never Smoker  . Smokeless tobacco: Never Used  . Alcohol use No  . Drug use: No  . Sexual activity: Not Currently   Other Topics Concern  . Not on file   Social History Narrative   Patient is divorced and lives with a roommate.   Patient is on disability.   Patient has a 10th grade education.   Patient is right-handed.   Patient drinks 6 sodas daily.                PHYSICAL EXAM  There were no vitals filed for this visit. There is no height or weight on file to calculate BMI.  Generalized: Well developed, in no acute distress   Neurological examination  Mentation: Alert oriented to time, place, history taking. Follows all commands speech and language fluent Cranial nerve II-XII: Pupils were equal round reactive to light. Extraocular movements were full, visual field were full on confrontational test. Facial sensation and strength were normal. Uvula tongue midline. Head turning and shoulder shrug  were normal and symmetric. Motor: The motor testing reveals 5 over 5  strength of all 4 extremities. Giveaway weakness noted in the right arm. Good symmetric motor tone is noted throughout. Tremor in the hand however and is distractible at times. Sensory: Sensory testing is intact to soft touch on all 4 extremities. No evidence of extinction is noted.  Coordination: Cerebellar testing reveals good finger-nose-finger and heel-to-shin bilaterally.  Gait and station: Gait is normal.  Romberg is negative. No drift is seen.  Reflexes: Deep tendon reflexes are symmetric and normal bilaterally.   DIAGNOSTIC DATA (LABS, IMAGING, TESTING) - I reviewed patient records, labs, notes, testing and imaging myself where available.  Lab Results  Component Value Date   WBC 7.5 07/21/2015   HGB 12.9 07/27/2014   HCT 43.0 07/21/2015   MCV 86 07/21/2015   PLT 223 07/21/2015      Component Value Date/Time   NA 147 (H) 07/21/2015 1520   K 4.6 07/21/2015 1520   CL 102 07/21/2015 1520   CO2 27 07/21/2015 1520   GLUCOSE 108 (H) 07/21/2015 1520   GLUCOSE 138 (H) 07/27/2014 1510   GLUCOSE 126 (H) 03/25/2014 0811   BUN 10 07/21/2015 1520   CREATININE 0.64 07/21/2015 1520   CALCIUM 9.5 07/21/2015 1520   PROT 6.7 07/21/2015 1520   ALBUMIN 4.4 07/21/2015 1520   AST 39 07/21/2015 1520   ALT 35 (H) 07/21/2015 1520   ALKPHOS 123 (H) 07/21/2015 1520   BILITOT 0.2  07/21/2015 1520   GFRNONAA 99 07/21/2015 1520   GFRAA 115 07/21/2015 1520   Lab Results  Component Value Date   CHOL 220 (H) 03/25/2014   HDL 75 (H) 03/25/2014   LDLCALC 104 (H) 03/25/2014   TRIG 204 (H) 03/25/2014   Lab Results  Component Value Date   HGBA1C 6.6 (H) 07/11/2014   Lab Results  Component Value Date   VITAMINB12 323 07/31/2014      ASSESSMENT AND PLAN 60 y.o. year old female  has a past medical history of Arthritis; Bipolar disorder ([place]); chronic low back pain; Chronic low back pain (02/18/2014); Depression; Fibroids; GERD (gastroesophageal reflux disease); Gout; Hyperlipidemia; Hypertension; and Obesity. here with:  1. Restless leg syndrome 2. Chronic back pain 3. Head tremor-new 4. Neck pain-new  Patient will continue on Mirapex and baclofen for restless leg syndrome and chronic back pain. She is reporting new head tremor associated with neck pain that radiates to the right shoulder. She has not been evaluated by her primary care. She is requesting something for pain. However I advised that this needs to be evaluated further. I suggested that we may need to do imaging of the neck. The patient initially was amenable to this however states that she may go to the emergency room due to the pain. I advised that she should call her primary care instead as they may be able to work her in today. I will check blood work and drug levels to ensure this is not the cause of her tremor. Patient is amenable to this plan. She will follow-up in 6 months with Dr. Jannifer Franklin.     Ward Givens, MSN, NP-C 01/21/2016, 11:05 AM [facility] [address], [place], [place] [address] [phone]

## 2016-01-21 NOTE — Patient Instructions (Addendum)
Continue Mirapex Blood work today Follow-up with PCP regarding new neck pain If your symptoms worsen or you develop new symptoms please let us know.

## 2016-01-21 NOTE — Progress Notes (Signed)
I have read the note, and I agree with the clinical assessment and plan.  Rachel Luna,Rachel Luna   

## 2016-01-22 LAB — COMPREHENSIVE METABOLIC PANEL
A/G RATIO: 1.8 (ref 1.2–2.2)
ALBUMIN: 4.2 g/dL (ref 3.5–5.5)
ALK PHOS: 81 IU/L (ref 39–117)
ALT: 26 IU/L (ref 0–32)
AST: 26 IU/L (ref 0–40)
BUN / CREAT RATIO: 22 (ref 9–23)
BUN: 15 mg/dL (ref 6–24)
CHLORIDE: 100 mmol/L (ref 96–106)
CO2: 28 mmol/L (ref 18–29)
Calcium: 9.3 mg/dL (ref 8.7–10.2)
Creatinine, Ser: 0.67 mg/dL (ref 0.57–1.00)
GFR calc Af Amer: 112 mL/min/{1.73_m2} (ref >59)
GFR calc non Af Amer: 97 mL/min/{1.73_m2} (ref >59)
GLOBULIN, TOTAL: 2.4 g/dL (ref 1.5–4.5)
Glucose: 83 mg/dL (ref 65–99)
POTASSIUM: 4.5 mmol/L (ref 3.5–5.2)
SODIUM: 143 mmol/L (ref 134–144)
Total Protein: 6.6 g/dL (ref 6.0–8.5)

## 2016-01-22 LAB — CBC WITH DIFFERENTIAL/PLATELET
Basophils Absolute: 0 10*3/uL (ref 0.0–0.2)
Basos: 1 %
EOS (ABSOLUTE): 0.4 10*3/uL (ref 0.0–0.4)
EOS: 6 %
HEMATOCRIT: 40.5 % (ref 34.0–46.6)
Hemoglobin: 13.6 g/dL (ref 11.1–15.9)
Immature Grans (Abs): 0 10*3/uL (ref 0.0–0.1)
Immature Granulocytes: 0 %
LYMPHS ABS: 2.7 10*3/uL (ref 0.7–3.1)
Lymphs: 44 %
MCH: 29.2 pg (ref 26.6–33.0)
MCHC: 33.6 g/dL (ref 31.5–35.7)
MCV: 87 fL (ref 79–97)
MONOS ABS: 0.5 10*3/uL (ref 0.1–0.9)
Monocytes: 8 %
Neutrophils Absolute: 2.5 10*3/uL (ref 1.4–7.0)
Neutrophils: 41 %
PLATELETS: 260 10*3/uL (ref 150–379)
RBC: 4.65 x10E6/uL (ref 3.77–5.28)
RDW: 14.3 % (ref 12.3–15.4)
WBC: 6.2 10*3/uL (ref 3.4–10.8)

## 2016-01-22 LAB — CARBAMAZEPINE LEVEL, TOTAL: Carbamazepine (Tegretol), S: 6.6 ug/mL (ref 4.0–12.0)

## 2016-01-23 ENCOUNTER — Telehealth: Payer: Self-pay | Admitting: *Deleted

## 2016-01-23 NOTE — Telephone Encounter (Signed)
Per Edman Circle, NP spoke with patient and informed her that her lab results are unremarkable. She verbalized understanding, appreciation.

## 2016-02-17 ENCOUNTER — Emergency Department: Payer: Medicare Other

## 2016-02-17 ENCOUNTER — Encounter: Payer: Self-pay | Admitting: Emergency Medicine

## 2016-02-17 ENCOUNTER — Emergency Department
Admission: EM | Admit: 2016-02-17 | Discharge: 2016-02-17 | Disposition: A | Discharge status: Home / Self Care | Payer: Medicare Other | Attending: Emergency Medicine | Admitting: Emergency Medicine

## 2016-02-17 DIAGNOSIS — Z79899 Other long term (current) drug therapy: Secondary | ICD-10-CM | POA: Diagnosis not present

## 2016-02-17 DIAGNOSIS — Y929 Unspecified place or not applicable: Secondary | ICD-10-CM | POA: Diagnosis not present

## 2016-02-17 DIAGNOSIS — Y999 Unspecified external cause status: Secondary | ICD-10-CM | POA: Insufficient documentation

## 2016-02-17 DIAGNOSIS — S63502A Unspecified sprain of left wrist, initial encounter: Secondary | ICD-10-CM | POA: Insufficient documentation

## 2016-02-17 DIAGNOSIS — I10 Essential (primary) hypertension: Secondary | ICD-10-CM | POA: Diagnosis not present

## 2016-02-17 DIAGNOSIS — M79642 Pain in left hand: Secondary | ICD-10-CM

## 2016-02-17 DIAGNOSIS — S6992XA Unspecified injury of left wrist, hand and finger(s), initial encounter: Secondary | ICD-10-CM | POA: Diagnosis present

## 2016-02-17 DIAGNOSIS — W1839XA Other fall on same level, initial encounter: Secondary | ICD-10-CM | POA: Diagnosis not present

## 2016-02-17 DIAGNOSIS — Y939 Activity, unspecified: Secondary | ICD-10-CM | POA: Insufficient documentation

## 2016-02-17 DIAGNOSIS — Z791 Long term (current) use of non-steroidal anti-inflammatories (NSAID): Secondary | ICD-10-CM | POA: Diagnosis not present

## 2016-02-17 MED ORDER — HYDROCODONE-ACETAMINOPHEN 5-325 MG PO TABS: 1.0000 | ORAL_TABLET | ORAL | 0 refills | Status: DC | PRN

## 2016-02-17 NOTE — ED Triage Notes (Signed)
Patient presents to the ED with left wrist pain and swelling x 1 week post fall.  Patient reports decreased mobility to left hand.  Patient is in no obvious distress at this time.

## 2016-02-17 NOTE — Discharge Instructions (Signed)
Please come schedule follow-up appointment with the orthopedic in [place]. I would like to have a repeat x-ray in about a week if possible. Return to the emergency department for symptoms that change or worsen if you're unable schedule an appointment. Wear the splint while out of bed and make sure you continue taking your diclofenac or meloxicam, but only take one or the other.

## 2016-02-18 ENCOUNTER — Encounter: Payer: Self-pay | Admitting: Psychiatry

## 2016-02-18 ENCOUNTER — Ambulatory Visit (INDEPENDENT_AMBULATORY_CARE_PROVIDER_SITE_OTHER): Payer: 59 | Admitting: Psychiatry

## 2016-02-18 VITALS — BP 129/80 | HR 71 | Temp 97.5°F | Wt 234.8 lb

## 2016-02-18 DIAGNOSIS — F3160 Bipolar disorder, current episode mixed, unspecified: Secondary | ICD-10-CM

## 2016-02-18 DIAGNOSIS — F1394 Sedative, hypnotic or anxiolytic use, unspecified with sedative, hypnotic or anxiolytic-induced mood disorder: Secondary | ICD-10-CM | POA: Diagnosis not present

## 2016-02-18 MED ORDER — CLONAZEPAM 0.5 MG PO TABS: 0.5000 mg | ORAL_TABLET | Freq: Two times a day (BID) | ORAL | 2 refills | Status: DC

## 2016-02-18 MED ORDER — ARIPIPRAZOLE 5 MG PO TABS: 5.0000 mg | ORAL_TABLET | Freq: Every day | ORAL | 0 refills | Status: DC

## 2016-02-18 MED ORDER — AMITRIPTYLINE HCL 10 MG PO TABS: 10.0000 mg | ORAL_TABLET | Freq: Every day | ORAL | 1 refills | Status: DC

## 2016-02-18 MED ORDER — DULOXETINE HCL 60 MG PO CPEP: 60.0000 mg | ORAL_CAPSULE | Freq: Every day | ORAL | 1 refills | Status: DC

## 2016-02-18 MED ORDER — DULOXETINE HCL 30 MG PO CPEP: 30.0000 mg | ORAL_CAPSULE | Freq: Every day | ORAL | 1 refills | Status: DC

## 2016-02-18 MED ORDER — GABAPENTIN 800 MG PO TABS: 800.0000 mg | ORAL_TABLET | Freq: Four times a day (QID) | ORAL | 6 refills | Status: DC

## 2016-02-18 MED ORDER — CARBAMAZEPINE 200 MG PO TABS: 100.0000 mg | ORAL_TABLET | Freq: Two times a day (BID) | ORAL | 1 refills | Status: DC

## 2016-02-18 NOTE — Progress Notes (Signed)
BH MD/PA/NP OP Progress Note  02/18/2016 8:45 AM AMBIKA Luna  MRN:  [id number]  Subjective:  Patient is a 60 year old female with history of bipolar disorder and benzodiazepine dependence who presented for follow-up appointment. She reported that she tripped and fell one week ago. She was wearing a brace in her hand. She reported that she went to the emergency room yesterday and they did x-ray on her left hand. They could not find anything in her hand. She has a swollen ligament and she was showing me the same. She reported that she continues to have tremors in her head and she can feel them. She also saw a neurologist last month and they are asking her to be evaluated in the emergency room for the MRI. She reported that she does not want to decrease the dose of her medications. We discussed about adjusting her medications but she is disagreeable at this time. However I have advised her to start decreasing the dose of Cymbalta and Abilify. She reported that she wants the holidays to be over now as she is getting tired.   [place] and it is clean. She eats microwavable foods. Patient is not agreeable to changing her lifestyle as she reported that she does not want to go out to meet people as she will lose her temper. . She denied having any suicidal ideations or plans.      Chief Complaint:  Chief Complaint    Follow-up; Medication Refill     Visit Diagnosis:   No diagnosis found.  Past Medical History:  Past Medical History:  Diagnosis Date  . Arthritis   . Bipolar disorder ([place])   . chronic low back pain   . Chronic low back pain 02/18/2014  . Depression   . Fibroids   . GERD (gastroesophageal reflux disease)   . Gout   . Hyperlipidemia   . Hypertension   . Obesity     Past Surgical History:  Procedure Laterality Date  . ABDOMINAL HYSTERECTOMY    . CHOLECYSTECTOMY    . FOOT SURGERY Left   . KNEE ARTHROPLASTY    . OVARIAN CYST REMOVAL    . SPINE  SURGERY     Family History:  Family History  Problem Relation Age of Onset  . Diabetes Mother   . Cancer Mother     breast  . Heart disease Mother   . Mental illness Mother     bipolar disorder  . Stroke Mother   . Diabetes Father   . Hypertension Father   . Diabetes Brother   . Bipolar disorder Brother   . Diabetes Brother    Social History:  Social History   Social History  . Marital status: Divorced    Spouse name: N/A  . Number of children: 0  . Years of education: 10   Occupational History  .  Unemployed   Social History Main Topics  . Smoking status: Never Smoker  . Smokeless tobacco: Never Used  . Alcohol use No  . Drug use: No  . Sexual activity: Not Currently   Other Topics Concern  . None   Social History Narrative   Patient is divorced and lives with a roommate.   Patient is on disability.   Patient has a 10th grade education.   Patient is right-handed.   Patient drinks 6 sodas daily.            Additional History:  Assessment:   Musculoskeletal: Strength &  Muscle Tone: within normal limits Gait & Station: What slow but patient has her left foot/toe healing. Patient leans: N/A  Psychiatric Specialty Exam: Medication Refill   Anxiety  Symptoms include nervous/anxious behavior. Patient reports no insomnia or suicidal ideas.    Depression         Associated symptoms include does not have insomnia and no suicidal ideas.  Past medical history includes anxiety.     Review of Systems  Psychiatric/Behavioral: Negative for hallucinations, memory loss, substance abuse and suicidal ideas. Depression: States she does has periods where she gets down when she thinks about her brother but otherwise feels her mood is been stable. The patient is nervous/anxious. The patient does not have insomnia.   All other systems reviewed and are negative.   Blood pressure 129/80, pulse 71, temperature 97.5 F (36.4 C), temperature source Oral, weight 234 lb  12.8 oz (106.5 kg).Body mass index is 40.3 kg/m.  General Appearance: Well Groomed  Eye Contact:  Good  Speech:  Clear and Coherent  Volume:  Normal  Mood:  Depressed  Affect:  Congruent and Labile   Thought Process:  Linear and Logical  Orientation:  Full (Time, Place, and Person)  Thought Content:  Negative  Suicidal Thoughts:  No  Homicidal Thoughts:  No  Memory:  Immediate;   Good Recent;   Good Remote;   Good  Judgement:  Good  Insight:  Good  Psychomotor Activity:  Negative  Concentration:  Good  Recall:  Good  Fund of Knowledge: Good  Language: Good  Akathisia:  Negative  Handed:  Right unknown   AIMS (if indicated):  Done today, normal  Assets:  Communication Skills Desire for Improvement Social Support  ADL's:  Intact  Cognition: WNL  Sleep:  poor   Is the patient at risk to self?  No. Has the patient been a risk to self in the past 6 months?  No. Has the patient been a risk to self within the distant past?  No. Is the patient a risk to others?  No. Has the patient been a risk to others in the past 6 months?  No. Has the patient been a risk to others within the distant past?  No.  Current Medications: Current Outpatient Prescriptions  Medication Sig Dispense Refill  . amitriptyline (ELAVIL) 25 MG tablet Take 1 tablet (25 mg total) by mouth at bedtime. 30 tablet 3  . ARIPiprazole (ABILIFY) 5 MG tablet Take 1 tablet (5 mg total) by mouth daily. 30 tablet 2  . baclofen (LIORESAL) 10 MG tablet Take 1 tablet (10 mg total) by mouth 3 (three) times daily. 270 tablet 3  . carbamazepine (TEGRETOL) 200 MG tablet Take 0.5 tablets (100 mg total) by mouth 2 (two) times daily. 180 tablet 1  . celecoxib (CELEBREX) 100 MG capsule     . cetirizine (ZYRTEC) 10 MG tablet Take by mouth.    . clonazePAM (KLONOPIN) 0.5 MG tablet Take 1 tablet (0.5 mg total) by mouth 2 (two) times daily. 60 tablet 2  . CRESTOR 20 MG tablet     . diclofenac (VOLTAREN) 75 MG EC tablet     .  DULoxetine (CYMBALTA) 30 MG capsule Take 1 capsule (30 mg total) by mouth daily. 90 capsule 1  . DULoxetine (CYMBALTA) 60 MG capsule Take 1 capsule (60 mg total) by mouth daily. 90 capsule 1  . estradiol (ESTRACE) 1 MG tablet take 1 tablet by mouth once daily 30 tablet 12  . estrogens, conjugated, (  PREMARIN) 0.625 MG tablet Take by mouth.    . fluticasone (FLONASE) 50 MCG/ACT nasal spray Place 2 sprays into both nostrils daily as needed (seasonal allergies).     . gabapentin (NEURONTIN) 800 MG tablet Take 1 tablet (800 mg total) by mouth 4 (four) times daily. 120 tablet 6  . HYDROcodone-acetaminophen (NORCO/VICODIN) 5-325 MG tablet Take 1 tablet by mouth every 4 (four) hours as needed for moderate pain. 12 tablet 0  . losartan-hydrochlorothiazide (HYZAAR) 50-12.5 MG tablet Take 1 tablet by mouth daily.    . meloxicam (MOBIC) 15 MG tablet     . NONFORMULARY OR COMPOUNDED ITEM [facility]:  Authorized Substitute Pain Cream Formulation - Ibuprofen 15%, Baclofen 1%, Gabapentin 3%, Lidocaine 2%, apply 1-2 grams to affected area 3-4 times daily. 120 each 11  . NONFORMULARY OR COMPOUNDED ITEM Apply 180 g topically 4 (four) times daily. Diclofenac 3%, Baclofen 2%, Cyclobenzaprine 2%, Lidocaine 2% 180 each 3  . omeprazole (PRILOSEC) 20 MG capsule Take 20 mg by mouth daily.     . pramipexole (MIRAPEX) 0.25 MG tablet take 1 tablet by mouth DURING THE DAY and 3 tablets AT NIGHT 360 tablet 3   No current facility-administered medications for this visit.     Medical Decision Making:  Established Problem, Worsening (2)  Treatment Plan Summary:Medication management     Decrease   Klonopin 0.5 mg by mouth twice a day to decrease sedation. Continue Abilify 5 mg daily - advised patient to take half a pill daily.  Refilled Cymbalta 30mg  and 60mg  for  90 day supply.- advised  patient to stop taking Cymbalta 30 mg and she agreed with the plan. Refilled amitriptyline 10 mg po qhs. Continue  Tegretol 100 mg  by mouth twice a day. She reported that she has been taking it for the seizure disorder.   She will follow-up in 1 months or earlier  depending on her symptoms   More than 50% of the time spent in psychoeducation, counseling and coordination of care.    This note was generated in part or whole with voice recognition software. Voice regonition is usually quite accurate but there are transcription errors that can and very often do occur. I apologize for any typographical errors that were not detected and corrected.   Rainey Pines, MD  02/18/2016, 8:45 AM

## 2016-02-18 NOTE — ED Provider Notes (Signed)
[facility] Emergency Department Provider Note ____________________________________________  Time seen: Approximately 1:42 PM  I have reviewed the triage vital signs and the nursing notes.   HISTORY  Chief Complaint Hand Pain    HPI Rachel Luna is a 60 y.o. female who presents to the emergency department for evaluation after sustaining a mechanical, non-syncopal fallabout a week ago. She continues to have pain in her left wrist and some swelling. She states that the pain and swelling have gotten worse over the past few days. She has been taking ibuprofen without relief. Patient states that she is right-hand dominant.  Past Medical History:  Diagnosis Date  . Arthritis   . Bipolar disorder ([place])   . chronic low back pain   . Chronic low back pain 02/18/2014  . Depression   . Fibroids   . GERD (gastroesophageal reflux disease)   . Gout   . Hyperlipidemia   . Hypertension   . Obesity     Patient Active Problem List   Diagnosis Date Noted  . Arthritis of foot, degenerative 04/10/2015  . 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 ANA positive 03/05/2015  . Foot pain 03/05/2015  . Elevated rheumatoid factor 03/05/2015  . BP (high blood pressure) 09/04/2014  . Avitaminosis D 09/04/2014  . Dizzy 07/31/2014  . Chronic low back pain 02/18/2014  . Pain in joint, lower leg 08/10/2013  . Acid reflux 03/12/2013  . Restless legs syndrome (RLS) 02/16/2013  . Disturbance of skin sensation 02/16/2013  . Menopausal hot flushes 08/14/2012  . Arthritis of knee, degenerative 04/06/2011  . Restless leg 11/11/2010  . Benign essential HTN 07/13/2010  . Elevated fasting blood sugar 07/13/2010  . Cannot sleep 07/13/2010  . Chronic pain 01/08/2010  . Affective disorder, major 11/05/2009  . Major depressive disorder 11/05/2009  . Allergic rhinitis 07/03/2009    Past Surgical History:  Procedure Laterality Date  . ABDOMINAL HYSTERECTOMY    .  CHOLECYSTECTOMY    . FOOT SURGERY Left   . KNEE ARTHROPLASTY    . OVARIAN CYST REMOVAL    . SPINE SURGERY      Prior to Admission medications   Medication Sig Start Date End Date Taking? Authorizing Provider  amitriptyline (ELAVIL) 10 MG tablet Take 1 tablet (10 mg total) by mouth at bedtime. 02/18/16   Rainey Pines, MD  ARIPiprazole (ABILIFY) 5 MG tablet Take 1 tablet (5 mg total) by mouth daily. 02/18/16   Rainey Pines, MD  baclofen (LIORESAL) 10 MG tablet Take 1 tablet (10 mg total) by mouth 3 (three) times daily. 07/21/15   Kathrynn Ducking, MD  carbamazepine (TEGRETOL) 200 MG tablet Take 0.5 tablets (100 mg total) by mouth 2 (two) times daily. 02/18/16   Rainey Pines, MD  celecoxib (CELEBREX) 100 MG capsule  12/24/15   Historical Provider, MD  cetirizine (ZYRTEC) 10 MG tablet Take by mouth. 03/12/13   Historical Provider, MD  clonazePAM (KLONOPIN) 0.5 MG tablet Take 1 tablet (0.5 mg total) by mouth 2 (two) times daily. 02/18/16   Rainey Pines, MD  CRESTOR 20 MG tablet  10/23/14   Historical Provider, MD  diclofenac (VOLTAREN) 75 MG EC tablet  01/14/16   Historical Provider, MD  DULoxetine (CYMBALTA) 30 MG capsule Take 1 capsule (30 mg total) by mouth daily. 02/18/16   Rainey Pines, MD  DULoxetine (CYMBALTA) 60 MG capsule Take 1 capsule (60 mg total) by mouth daily. 02/18/16   Rainey Pines, MD  estradiol (ESTRACE) 1 MG  tablet take 1 tablet by mouth once daily 09/01/15   Shelly Bombard, MD  estrogens, conjugated, (PREMARIN) 0.625 MG tablet Take by mouth.    Historical Provider, MD  fluticasone (FLONASE) 50 MCG/ACT nasal spray Place 2 sprays into both nostrils daily as needed (seasonal allergies).  08/08/12   Historical Provider, MD  gabapentin (NEURONTIN) 800 MG tablet Take 1 tablet (800 mg total) by mouth 4 (four) times daily. 02/18/16   Rainey Pines, MD  HYDROcodone-acetaminophen (NORCO/VICODIN) 5-325 MG tablet Take 1 tablet by mouth every 4 (four) hours as needed for moderate pain. 02/17/16 02/16/17   Victorino Dike, FNP  losartan-hydrochlorothiazide (HYZAAR) 50-12.5 MG tablet Take 1 tablet by mouth daily. 11/30/14   Historical Provider, MD  meloxicam (MOBIC) 15 MG tablet  07/04/15   Historical Provider, MD  NONFORMULARY OR COMPOUNDED [facility]:  Authorized Substitute Pain Cream Formulation - Ibuprofen 15%, Baclofen 1%, Gabapentin 3%, Lidocaine 2%, apply 1-2 grams to affected area 3-4 times daily. 09/03/15   Trula Slade, DPM  NONFORMULARY OR COMPOUNDED ITEM Apply 180 g topically 4 (four) times daily. Diclofenac 3%, Baclofen 2%, Cyclobenzaprine 2%, Lidocaine 2% 10/10/15   Wallene Huh, DPM  omeprazole (PRILOSEC) 20 MG capsule Take 20 mg by mouth daily.  07/11/12   Historical Provider, MD  pramipexole (MIRAPEX) 0.25 MG tablet take 1 tablet by mouth DURING THE DAY and 3 tablets AT NIGHT 07/21/15   Kathrynn Ducking, MD    Allergies Lisinopril; Wellbutrin [bupropion]; and Neupro [rotigotine]  Family History  Problem Relation Age of Onset  . Diabetes Mother   . Cancer Mother     breast  . Heart disease Mother   . Mental illness Mother     bipolar disorder  . Stroke Mother   . Diabetes Father   . Hypertension Father   . Diabetes Brother   . Bipolar disorder Brother   . Diabetes Brother     Social History Social History  Substance Use Topics  . Smoking status: Never Smoker  . Smokeless tobacco: Never Used  . Alcohol use No    Review of Systems Constitutional: No recent illness. Cardiovascular: Denies chest pain or palpitations. Respiratory: Denies shortness of breath. Musculoskeletal: Pain in Left wrist Skin: Negative for rash, wound, lesion. Neurological: Negative for focal weakness or numbness.  ____________________________________________   PHYSICAL EXAM:  VITAL SIGNS: ED Triage Vitals  Enc Vitals Group     BP 02/17/16 1311 136/77     Pulse Rate 02/17/16 1311 78     Resp 02/17/16 1311 18     Temp 02/17/16 1311 98.4 F (36.9 C)     Temp Source  02/17/16 1311 Oral     SpO2 02/17/16 1311 95 %     Weight 02/17/16 1312 220 lb (99.8 kg)     Height 02/17/16 1312 5\' 4"  (1.626 m)     Head Circumference --      Peak Flow --      Pain Score 02/17/16 1312 9     Pain Loc --      Pain Edu? --      Excl. in [place]? --     Constitutional: Alert and oriented. Well appearing and in no acute distress. Eyes: Conjunctivae are normal. EOMI. Head: Atraumatic. Neck: No stridor.  Respiratory: Normal respiratory effort.   Musculoskeletal: Joint line tenderness on the left wrist. Full range of motion of the fingers of the left hand. Limited rotation of the left wrist secondary to pain.  No snuffbox tenderness. Neurologic:  Normal speech and language. No gross focal neurologic deficits are appreciated. Speech is normal. No gait instability. Skin:  Skin is warm, dry and intact. Atraumatic. Psychiatric: Mood and affect are normal. Speech and behavior are normal.  ____________________________________________   LABS (all labs ordered are listed, but only abnormal results are displayed)  Labs Reviewed - No data to display ____________________________________________  RADIOLOGY  No acute bony abnormality of the left wrist per radiology. ____________________________________________   PROCEDURES  Procedure(s) performed: Cock-up splint was applied to the left wrist and hand. Patient was neurovascularly intact after application.   ____________________________________________   INITIAL IMPRESSION / ASSESSMENT AND PLAN / ED COURSE  Clinical Course    60 year old female who presents to the emergency department one week after having a mechanical, non-syncopal fall. There is no indication of fracture on the x-ray. She is placed in a cock up splint and advised to wear anytime she is out of bed. She was advised to follow-up with orthopedics for symptoms that are not improving over the week. Patient was given a short course of Norco for pain. She was advised  to return to the emergency department for symptoms that change or worsen if she is unable schedule appointment with orthopedics or her primary care provider.  Pertinent labs & imaging results that were available during my care of the patient were reviewed by me and considered in my medical decision making (see chart for details).  ____________________________________________   FINAL CLINICAL IMPRESSION(S) / ED DIAGNOSES  Final diagnoses:  Left hand pain  Wrist sprain, left, initial encounter       Victorino Dike, FNP 02/18/16 [place], MD 02/18/16 2028

## 2016-03-17 ENCOUNTER — Encounter: Payer: Self-pay | Admitting: Psychiatry

## 2016-03-17 ENCOUNTER — Ambulatory Visit (INDEPENDENT_AMBULATORY_CARE_PROVIDER_SITE_OTHER): Payer: 59 | Admitting: Psychiatry

## 2016-03-17 VITALS — BP 116/78 | HR 79 | Temp 98.3°F | Wt 231.6 lb

## 2016-03-17 DIAGNOSIS — F1394 Sedative, hypnotic or anxiolytic use, unspecified with sedative, hypnotic or anxiolytic-induced mood disorder: Secondary | ICD-10-CM

## 2016-03-17 DIAGNOSIS — F3160 Bipolar disorder, current episode mixed, unspecified: Secondary | ICD-10-CM | POA: Diagnosis not present

## 2016-03-17 MED ORDER — AMITRIPTYLINE HCL 10 MG PO TABS: 20.0000 mg | ORAL_TABLET | Freq: Every day | ORAL | 1 refills | Status: DC

## 2016-03-17 MED ORDER — CLONAZEPAM 0.5 MG PO TABS: 0.5000 mg | ORAL_TABLET | Freq: Every day | ORAL | 1 refills | Status: DC

## 2016-03-17 MED ORDER — ARIPIPRAZOLE 5 MG PO TABS: 5.0000 mg | ORAL_TABLET | Freq: Every day | ORAL | 1 refills | Status: DC

## 2016-03-17 NOTE — Progress Notes (Signed)
BH MD/PA/NP OP Progress Note  03/17/2016 8:48 AM Rachel Luna  MRN:  [id number]  Subjective:  Patient is a 61 year old female with history of bipolar disorder and benzodiazepine dependence who presented for follow-up appointment. She reported that she increase her dose of amitriptyline and is currently taking 20 mg at bedtime. Patient reported that she is feeling well. She has been sleeping through the night and is not having any side effects of the medication. We discussed about her medications. She reported that she has decreased the dose of Cymbalta and is taking milligrams in the morning. Patient reported that she feels more alert and calm. She she denied having any side effects of medication. She reported that the current combination of medication is helping her. She has not increased the dose of it all and is only taking once a day. Patient reported that she is happy that we have discussed her medications. She denied having any suicidal homicidal ideations or plans.  Advised patient to start using a pill box and to take Abilify on alternate days and she agreed with the plan.  [place] and it is clean. She eats microwavable foods.      Chief Complaint:  Chief Complaint    Follow-up; Medication Refill     Visit Diagnosis:   No diagnosis found.  Past Medical History:  Past Medical History:  Diagnosis Date  . Arthritis   . Bipolar disorder ([place])   . chronic low back pain   . Chronic low back pain 02/18/2014  . Depression   . Fibroids   . GERD (gastroesophageal reflux disease)   . Gout   . Hyperlipidemia   . Hypertension   . Obesity     Past Surgical History:  Procedure Laterality Date  . ABDOMINAL HYSTERECTOMY    . CHOLECYSTECTOMY    . FOOT SURGERY Left   . KNEE ARTHROPLASTY    . OVARIAN CYST REMOVAL    . SPINE SURGERY     Family History:  Family History  Problem Relation Age of Onset  . Diabetes Mother   . Cancer Mother     breast  . Heart  disease Mother   . Mental illness Mother     bipolar disorder  . Stroke Mother   . Diabetes Father   . Hypertension Father   . Diabetes Brother   . Bipolar disorder Brother   . Diabetes Brother    Social History:  Social History   Social History  . Marital status: Divorced    Spouse name: N/A  . Number of children: 0  . Years of education: 10   Occupational History  .  Unemployed   Social History Main Topics  . Smoking status: Never Smoker  . Smokeless tobacco: Never Used  . Alcohol use No  . Drug use: No  . Sexual activity: Not Currently   Other Topics Concern  . None   Social History Narrative   Patient is divorced and lives with a roommate.   Patient is on disability.   Patient has a 10th grade education.   Patient is right-handed.   Patient drinks 6 sodas daily.            Additional History:  Assessment:   Musculoskeletal: Strength & Muscle Tone: within normal limits Gait & Station: What slow but patient has her left foot/toe healing. Patient leans: N/A  Psychiatric Specialty Exam: Medication Refill   Anxiety  Symptoms include nervous/anxious behavior. Patient reports no  insomnia or suicidal ideas.    Depression         Associated symptoms include does not have insomnia and no suicidal ideas.  Past medical history includes anxiety.     Review of Systems  Psychiatric/Behavioral: Negative for hallucinations, memory loss, substance abuse and suicidal ideas. Depression: States she does has periods where she gets down when she thinks about her brother but otherwise feels her mood is been stable. The patient is nervous/anxious. The patient does not have insomnia.   All other systems reviewed and are negative.   Blood pressure 116/78, pulse 79, temperature 98.3 F (36.8 C), temperature source Oral, weight 231 lb 9.6 oz (105.1 kg).Body mass index is 39.75 kg/m.  General Appearance: Well Groomed  Eye Contact:  Good  Speech:  Clear and Coherent   Volume:  Normal  Mood:  Depressed  Affect:  Congruent and Labile   Thought Process:  Linear and Logical  Orientation:  Full (Time, Place, and Person)  Thought Content:  Negative  Suicidal Thoughts:  No  Homicidal Thoughts:  No  Memory:  Immediate;   Good Recent;   Good Remote;   Good  Judgement:  Good  Insight:  Good  Psychomotor Activity:  Negative  Concentration:  Good  Recall:  Good  Fund of Knowledge: Good  Language: Good  Akathisia:  Negative  Handed:  Right unknown   AIMS (if indicated):  Done today, normal  Assets:  Communication Skills Desire for Improvement Social Support  ADL's:  Intact  Cognition: WNL  Sleep:  poor   Is the patient at risk to self?  No. Has the patient been a risk to self in the past 6 months?  No. Has the patient been a risk to self within the distant past?  No. Is the patient a risk to others?  No. Has the patient been a risk to others in the past 6 months?  No. Has the patient been a risk to others within the distant past?  No.  Current Medications: Current Outpatient Prescriptions  Medication Sig Dispense Refill  . amitriptyline (ELAVIL) 10 MG tablet Take 1 tablet (10 mg total) by mouth at bedtime. 30 tablet 1  . ARIPiprazole (ABILIFY) 5 MG tablet Take 1 tablet (5 mg total) by mouth daily. 30 tablet 0  . baclofen (LIORESAL) 10 MG tablet Take 1 tablet (10 mg total) by mouth 3 (three) times daily. 270 tablet 3  . carbamazepine (TEGRETOL) 200 MG tablet Take 0.5 tablets (100 mg total) by mouth 2 (two) times daily. 180 tablet 1  . celecoxib (CELEBREX) 100 MG capsule     . clonazePAM (KLONOPIN) 0.5 MG tablet Take 1 tablet (0.5 mg total) by mouth 2 (two) times daily. 60 tablet 2  . CRESTOR 20 MG tablet     . DULoxetine (CYMBALTA) 60 MG capsule Take 1 capsule (60 mg total) by mouth daily. 90 capsule 1  . estradiol (ESTRACE) 1 MG tablet take 1 tablet by mouth once daily 30 tablet 12  . estrogens, conjugated, (PREMARIN) 0.625 MG tablet Take by  mouth.    . gabapentin (NEURONTIN) 800 MG tablet Take 1 tablet (800 mg total) by mouth 4 (four) times daily. 120 tablet 6  . losartan-hydrochlorothiazide (HYZAAR) 50-12.5 MG tablet Take 1 tablet by mouth daily.    . meloxicam (MOBIC) 15 MG tablet     . NONFORMULARY OR COMPOUNDED ITEM [facility]:  Authorized Substitute Pain Cream Formulation - Ibuprofen 15%, Baclofen 1%, Gabapentin 3%, Lidocaine 2%,  apply 1-2 grams to affected area 3-4 times daily. 120 each 11  . NONFORMULARY OR COMPOUNDED ITEM Apply 180 g topically 4 (four) times daily. Diclofenac 3%, Baclofen 2%, Cyclobenzaprine 2%, Lidocaine 2% 180 each 3  . pramipexole (MIRAPEX) 0.25 MG tablet take 1 tablet by mouth DURING THE DAY and 3 tablets AT NIGHT 360 tablet 3   No current facility-administered medications for this visit.     Medical Decision Making:  Established Problem, Worsening (2)  Treatment Plan Summary:Medication management     Decrease   Klonopin 0.5 mg by mouth Daily Continue Abilify 5 mg daily - taking it on alternate days. Patient agreed with the plan.  Refilled Cymbalta  60mg  for  90 day supply.-  Refilled amitriptyline 20 mg po qhs. She has supply of Tegretol.  Patient will follow up in 2 months or earlier depending on her symptoms.   More than 50% of the time spent in psychoeducation, counseling and coordination of care.    This note was generated in part or whole with voice recognition software. Voice regonition is usually quite accurate but there are transcription errors that can and very often do occur. I apologize for any typographical errors that were not detected and corrected.   Rainey Pines, MD  03/17/2016, 8:48 AM

## 2016-04-15 ENCOUNTER — Ambulatory Visit (INDEPENDENT_AMBULATORY_CARE_PROVIDER_SITE_OTHER): Payer: Medicare Other | Admitting: Podiatry

## 2016-04-15 DIAGNOSIS — L600 Ingrowing nail: Secondary | ICD-10-CM | POA: Diagnosis not present

## 2016-04-15 MED ORDER — CEPHALEXIN 500 MG PO CAPS: 500.0000 mg | ORAL_CAPSULE | Freq: Three times a day (TID) | ORAL | 2 refills | Status: DC

## 2016-04-15 NOTE — Progress Notes (Signed)
Subjective: 61 year old female presents to the office today for concerns of an ingrown toenail to her right big toe which has been ongoing for the last few weeks. The area is painful with pressure and shoes. No redness, drainage or swelling. The area is painful with pressure and shoes.  Denies any systemic complaints such as fevers, chills, nausea, vomiting. No acute changes since last appointment, and no other complaints at this time.   Objective: AAO x3, NAD DP/PT pulses palpable bilaterally, CRT less than 3 seconds There is incurvation along the medial aspect of the right hallux toenail. There is localized edema but there is no erythema or increase in warmth. There is tenderness to the nail border. There is no drainage or pus. No ascending cellulitis.  No open lesions or pre-ulcerative lesions.  No pain with calf compression, swelling, warmth, erythema  Assessment: Right hallux medial ingrown toenail  Plan: -All treatment options discussed with the patient including all alternatives, risks, complications.  -At this time, the patient is requesting partial nail removal with chemical matricectomy to the symptomatic portion of the nail. Risks and complications were discussed with the patient for which they understand and  verbally consent to the procedure. Under sterile conditions a total of 3 mL of a mixture of 2% lidocaine plain and 0.5% Marcaine plain was infiltrated in a hallux block fashion. Once anesthetized, the skin was prepped in sterile fashion. A tourniquet was then applied. Next the medial aspect of hallux nail border was then sharply excised making sure to remove the entire offending nail border. Once the nails were ensured to be removed area was debrided and the underlying skin was intact. There is no purulence identified in the procedure. Next phenol was then applied under standard conditions and copiously irrigated. Silvadene was applied. A dry sterile dressing was applied. After  application of the dressing the tourniquet was removed and there is found to be an immediate capillary refill time to the digit. The patient tolerated the procedure well any complications. Post procedure instructions were discussed the patient for which he verbally understood. Follow-up in one week for nail check or sooner if any problems are to arise. Discussed signs/symptoms of infection and directed to call the office immediately should any occur or go directly to the emergency room. In the meantime, encouraged to call the office with any questions, concerns, changes symptoms. -Patient encouraged to call the office with any questions, concerns, change in symptoms.   Celesta Gentile, DPM

## 2016-04-15 NOTE — Patient Instructions (Signed)

## 2016-04-22 ENCOUNTER — Ambulatory Visit: Payer: Medicare Other | Admitting: Podiatry

## 2016-05-06 ENCOUNTER — Ambulatory Visit (INDEPENDENT_AMBULATORY_CARE_PROVIDER_SITE_OTHER): Payer: Medicare Other | Admitting: Podiatry

## 2016-05-06 ENCOUNTER — Encounter: Payer: Self-pay | Admitting: Podiatry

## 2016-05-06 DIAGNOSIS — L600 Ingrowing nail: Secondary | ICD-10-CM | POA: Diagnosis not present

## 2016-05-06 MED ORDER — NONFORMULARY OR COMPOUNDED ITEM: 2 refills | Status: DC

## 2016-05-06 NOTE — Progress Notes (Signed)
Subjective: Rachel Luna is a 61 y.o.  female returns to office today for follow up evaluation after having right Hallux partial nail avulsion performed. Patient has been soaking using epsom salts but has not been applying topical antibiotic or covering with bandaid daily. Patient denies fevers, chills, nausea, vomiting. Denies any calf pain, chest pain, SOB.   Objective:  Vitals: Reviewed  General: Well developed, nourished, in no acute distress, alert and oriented x3   Dermatology: Skin is warm, dry and supple bilateral. Medial hallux nail border appears to be  dry, with mild granular tissue and surrounding scab. There is no surrounding erythema, edema, drainage/purulence. There is dirt in the procedure site and her feet are dirty overall.The remaining nails appear unremarkable at this time. There are no other lesions or other signs of infection present.  Neurovascular status: Intact. No lower extremity swelling; No pain with calf compression bilateral.  Musculoskeletal: Decreased tenderness to palpation of the righthallux nail fold. Muscular strength within normal limits bilateral.   Assesement and Plan: S/p partial nail avulsion, doing well.   -Continue soaking in epsom salts twice a day followed by antibiotic ointment and a band-aid. Can leave uncovered at night. Continue this until completely healed.  -If the area has not healed in 2 weeks, call the office for follow-up appointment, or sooner if any problems arise.  -Monitor for any signs/symptoms of infection. Call the office immediately if any occur or go directly to the emergency room. Call with any questions/concerns.  Celesta Gentile, DPM

## 2016-05-12 ENCOUNTER — Ambulatory Visit: Payer: 59 | Admitting: Psychiatry

## 2016-06-06 ENCOUNTER — Emergency Department (HOSPITAL_COMMUNITY)
Admission: EM | Admit: 2016-06-06 | Discharge: 2016-06-06 | Disposition: A | Discharge status: Home / Self Care | Payer: Medicare Other | Attending: Emergency Medicine | Admitting: Emergency Medicine

## 2016-06-06 ENCOUNTER — Encounter (HOSPITAL_COMMUNITY): Payer: Self-pay

## 2016-06-06 DIAGNOSIS — I10 Essential (primary) hypertension: Secondary | ICD-10-CM | POA: Diagnosis not present

## 2016-06-06 DIAGNOSIS — T401X1A Poisoning by heroin, accidental (unintentional), initial encounter: Secondary | ICD-10-CM | POA: Diagnosis not present

## 2016-06-06 MED ORDER — ONDANSETRON 4 MG PO TBDP
4.0000 mg | ORAL_TABLET | Freq: Once | ORAL | Status: AC
  Administered 2016-06-06: 4 mg via ORAL
  Filled 2016-06-06: qty 1

## 2016-06-06 NOTE — ED Provider Notes (Signed)
[facility] DEPT Provider Note    By signing my name below, I, Bea Graff, attest that this documentation has been prepared under the direction and in the presence of Everlene Balls, MD. Electronically Signed: Bea Graff, ED Scribe. 06/06/16. 2:15 AM.    History   Chief Complaint Chief Complaint  Patient presents with  . Ingestion   The history is provided by the patient and medical records. No language interpreter was used.    Rachel Luna is an obese 61 y.o. female with PMHx of bipolar disorder, chronic back pain, depression, gout, HLD, HTN brought in by EMS who presents to the Emergency Department after overdosing on heroin at home earlier tonight. She states she injected it with a needle and reports it is her first time ever using heroin. She reports associated nausea and vomiting. She was given Narcan by EMS. There are no modifying factors noted. She denies SI, HI.   Past Medical History:  Diagnosis Date  . Arthritis   . Bipolar disorder ([place])   . chronic low back pain   . Chronic low back pain 02/18/2014  . Depression   . Fibroids   . GERD (gastroesophageal reflux disease)   . Gout   . Hyperlipidemia   . Hypertension   . Obesity     Patient Active Problem List   Diagnosis Date Noted  . Ingrown toenail 04/15/2016  . Arthritis of foot, degenerative 04/10/2015  . 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 ANA positive 03/05/2015  . Foot pain 03/05/2015  . Elevated rheumatoid factor 03/05/2015  . BP (high blood pressure) 09/04/2014  . Avitaminosis D 09/04/2014  . Dizzy 07/31/2014  . Chronic low back pain 02/18/2014  . Pain in joint, lower leg 08/10/2013  . Acid reflux 03/12/2013  . Restless legs syndrome (RLS) 02/16/2013  . Disturbance of skin sensation 02/16/2013  . Menopausal hot flushes 08/14/2012  . Arthritis of knee, degenerative 04/06/2011  . Restless leg 11/11/2010  . Benign essential HTN 07/13/2010  . Elevated fasting blood sugar  07/13/2010  . Cannot sleep 07/13/2010  . Chronic pain 01/08/2010  . Affective disorder, major 11/05/2009  . Major depressive disorder 11/05/2009  . Allergic rhinitis 07/03/2009    Past Surgical History:  Procedure Laterality Date  . ABDOMINAL HYSTERECTOMY    . CHOLECYSTECTOMY    . FOOT SURGERY Left   . KNEE ARTHROPLASTY    . OVARIAN CYST REMOVAL    . SPINE SURGERY      OB History    Gravida Para Term Preterm AB Living   0 0 0 0 0 0   SAB TAB Ectopic Multiple Live Births   0 0 0 0         Home Medications    Prior to Admission medications   Medication Sig Start Date End Date Taking? Authorizing Provider  baclofen (LIORESAL) 10 MG tablet Take 1 tablet (10 mg total) by mouth 3 (three) times daily. 07/21/15  Yes Kathrynn Ducking, MD  carbamazepine (TEGRETOL) 200 MG tablet Take 0.5 tablets (100 mg total) by mouth 2 (two) times daily. 02/18/16  Yes Rainey Pines, MD  clonazePAM (KLONOPIN) 0.5 MG tablet Take 1 tablet (0.5 mg total) by mouth daily. 03/17/16  Yes Rainey Pines, MD  DULoxetine (CYMBALTA) 60 MG capsule Take 1 capsule (60 mg total) by mouth daily. 02/18/16  Yes Rainey Pines, MD  estradiol (ESTRACE) 1 MG tablet take 1 tablet by mouth once daily 09/01/15  Yes Shelly Bombard, MD  gabapentin (NEURONTIN) 800 MG tablet Take 1 tablet (800 mg total) by mouth 4 (four) times daily. 02/18/16  Yes Rainey Pines, MD  losartan-hydrochlorothiazide (HYZAAR) 50-12.5 MG tablet Take 1 tablet by mouth daily. 11/30/14  Yes Historical Provider, MD  meloxicam (MOBIC) 15 MG tablet  07/04/15  Yes Historical Provider, MD  pramipexole (MIRAPEX) 0.25 MG tablet take 1 tablet by mouth DURING THE DAY and 3 tablets AT NIGHT 07/21/15  Yes Kathrynn Ducking, MD  rosuvastatin (CRESTOR) 40 MG tablet Take 1 tablet by mouth daily. 03/23/16  Yes Historical Provider, MD  amitriptyline (ELAVIL) 10 MG tablet Take 2 tablets (20 mg total) by mouth at bedtime. Patient not taking: Reported on 06/06/2016 03/17/16   Rainey Pines, MD    ARIPiprazole (ABILIFY) 5 MG tablet Take 1 tablet (5 mg total) by mouth daily. Patient not taking: Reported on 06/06/2016 03/17/16   Rainey Pines, MD  cephALEXin (KEFLEX) 500 MG capsule Take 1 capsule (500 mg total) by mouth 3 (three) times daily. Patient not taking: Reported on 06/06/2016 04/15/16   Trula Slade, DPM    Family History Family History  Problem Relation Age of Onset  . Diabetes Mother   . Cancer Mother     breast  . Heart disease Mother   . Mental illness Mother     bipolar disorder  . Stroke Mother   . Diabetes Father   . Hypertension Father   . Diabetes Brother   . Bipolar disorder Brother   . Diabetes Brother     Social History Social History  Substance Use Topics  . Smoking status: Never Smoker  . Smokeless tobacco: Never Used  . Alcohol use No     Allergies   Geodon [ziprasidone hcl]; Lisinopril; Wellbutrin [bupropion]; and Neupro [rotigotine]   Review of Systems Review of Systems A complete 10 system review of systems was obtained and all systems are negative except as noted in the HPI and PMH.    Physical Exam Updated Vital Signs BP 110/66 (BP Location: Left Arm)   Pulse 70   Temp 98.3 F (36.8 C) (Oral)   Resp 18   Ht 5\' 7"  (1.702 m)   Wt 231 lb (104.8 kg)   SpO2 98%   BMI 36.18 kg/m   Physical Exam  Constitutional: She is oriented to person, place, and time. She appears well-developed and well-nourished. No distress.  HENT:  Head: Normocephalic and atraumatic.  Nose: Nose normal.  Mouth/Throat: Oropharynx is clear and moist. No oropharyngeal exudate.  Eyes: Conjunctivae and EOM are normal. Pupils are equal, round, and reactive to light. No scleral icterus.  Neck: Normal range of motion. Neck supple. No JVD present. No tracheal deviation present. No thyromegaly present.  Cardiovascular: Normal rate, regular rhythm and normal heart sounds.  Exam reveals no gallop and no friction rub.   No murmur heard. Pulmonary/Chest: Effort normal  and breath sounds normal. No respiratory distress. She has no wheezes. She exhibits no tenderness.  Abdominal: Soft. Bowel sounds are normal. She exhibits no distension and no mass. There is no tenderness. There is no rebound and no guarding.  Musculoskeletal: Normal range of motion. She exhibits no edema or tenderness.  Lymphadenopathy:    She has no cervical adenopathy.  Neurological: She is alert and oriented to person, place, and time. No cranial nerve deficit. She exhibits normal muscle tone.  Skin: Skin is warm and dry. No rash noted. No erythema. No pallor.  Nursing note and vitals reviewed.  ED Treatments / Results  DIAGNOSTIC STUDIES: Oxygen Saturation is 98% on RA, normal by my interpretation.   COORDINATION OF CARE: 1:14 AM- Will order Zofran. Pt verbalizes understanding and agrees to plan.  Medications  ondansetron (ZOFRAN-ODT) disintegrating tablet 4 mg (4 mg Oral Given 06/06/16 0128)    Labs (all labs ordered are listed, but only abnormal results are displayed) Labs Reviewed - No data to display  EKG  EKG Interpretation None       Radiology No results found.  Procedures Procedures (including critical care time)  Medications Ordered in ED Medications  ondansetron (ZOFRAN-ODT) disintegrating tablet 4 mg (4 mg Oral Given 06/06/16 0128)     Initial Impression / Assessment and Plan / ED Course  I have reviewed the triage vital signs and the nursing notes.  Pertinent labs & imaging results that were available during my care of the patient were reviewed by me and considered in my medical decision making (see chart for details).      Patient presents to the ED for heroin overdose.  She is back to her normal baseline now after narcan.  Will continue to observe in the ED to ensure there is no recurrence of the heroin effects on her respirations.  She was given zofran for her nausea.  If she continues to be stable on RA, she will be safe for DC.     I  personally performed the services described in this documentation, which was scribed in my presence. The recorded information has been reviewed and is accurate.     Final Clinical Impressions(s) / ED Diagnoses   Final diagnoses:  Accidental overdose of heroin, initial encounter    New Prescriptions New Prescriptions   No medications on file     Everlene Balls, MD 06/06/16 0225

## 2016-06-06 NOTE — ED Notes (Signed)
Bed: JT70 Expected date:  Expected time:  Means of arrival:  Comments: EMS 61 yo female agonal breathing in bathtub/Narcan NT and IV

## 2016-06-06 NOTE — ED Triage Notes (Signed)
States took overdose of heroin at home no SI or HI voiced. Fire gave 2mg  narcan internasal, 1mg  narcan IV

## 2016-06-06 NOTE — ED Notes (Signed)
No respiratory or acute distress noted alert and oriented x 3 call light in reach visitor at bedside. 

## 2016-06-06 NOTE — ED Notes (Signed)
No respiratory or acute distress noted alert and oriented x 3 call light in reach no reaction to medication noted. 

## 2016-06-30 ENCOUNTER — Other Ambulatory Visit: Payer: Self-pay | Admitting: Neurology

## 2016-07-12 ENCOUNTER — Other Ambulatory Visit: Payer: Self-pay | Admitting: Neurology

## 2016-07-23 ENCOUNTER — Encounter: Payer: Self-pay | Admitting: Neurology

## 2016-07-23 ENCOUNTER — Ambulatory Visit (INDEPENDENT_AMBULATORY_CARE_PROVIDER_SITE_OTHER): Payer: Medicare Other | Admitting: Neurology

## 2016-07-23 VITALS — BP 115/68 | HR 66 | Wt 231.5 lb

## 2016-07-23 DIAGNOSIS — M545 Low back pain: Secondary | ICD-10-CM | POA: Diagnosis not present

## 2016-07-23 DIAGNOSIS — G2581 Restless legs syndrome: Secondary | ICD-10-CM | POA: Diagnosis not present

## 2016-07-23 DIAGNOSIS — G8929 Other chronic pain: Secondary | ICD-10-CM | POA: Diagnosis not present

## 2016-07-23 MED ORDER — PRAMIPEXOLE DIHYDROCHLORIDE 0.5 MG PO TABS: ORAL_TABLET | ORAL | 3 refills | Status: DC

## 2016-07-23 NOTE — Progress Notes (Signed)
Reason for visit: Restless leg syndrome, chronic low back pain  Rachel Luna is an 61 y.o. female  History of present illness:  Rachel Luna is a 61 year old right-handed white female with a history of bipolar disorder and chronic low back pain. The patient has restless leg syndrome, she is also on Abilify but she claims that she has cut back on the dose to a 2.5 mg dosing at night. The patient was in the emergency room on 06/06/2016 with a heroin overdose, she claims that this was her one and only time using heroin. The patient is no longer followed through psychiatry. The patient claims that her restless leg syndrome has worsened over time. Her low back pain is also gotten worse and she has sciatica type pain down the right leg. She believes that her right leg is somewhat weak. She has been seen by Dr. Joya Salm in the past, but he has retired. She returns to this office for an evaluation. He remains on gabapentin and baclofen for the back.  Past Medical History:  Diagnosis Date  . Arthritis   . Bipolar disorder ([place])   . chronic low back pain   . Chronic low back pain 02/18/2014  . Depression   . Fibroids   . GERD (gastroesophageal reflux disease)   . Gout   . Hyperlipidemia   . Hypertension   . Obesity     Past Surgical History:  Procedure Laterality Date  . ABDOMINAL HYSTERECTOMY    . CHOLECYSTECTOMY    . FOOT SURGERY Left   . KNEE ARTHROPLASTY    . OVARIAN CYST REMOVAL    . SPINE SURGERY      Family History  Problem Relation Age of Onset  . Diabetes Mother   . Cancer Mother        breast  . Heart disease Mother   . Mental illness Mother        bipolar disorder  . Stroke Mother   . Diabetes Father   . Hypertension Father   . Diabetes Brother   . Bipolar disorder Brother   . Diabetes Brother     Social history:  reports that she has never smoked. She has never used smokeless tobacco. She reports that she does not drink alcohol or use drugs.    Allergies    Allergen Reactions  . Geodon [Ziprasidone Hcl] Other (See Comments)    Worsened Restless Leg Syndrome  . Lisinopril Cough  . Wellbutrin [Bupropion] Other (See Comments)    Irritability and Agitation  . Neupro [Rotigotine] Rash    Medications:  Prior to Admission medications   Medication Sig Start Date End Date Taking? Authorizing Provider  ARIPiprazole (ABILIFY) 5 MG tablet Take 1 tablet (5 mg total) by mouth daily. 03/17/16  Yes Rainey Pines, MD  baclofen (LIORESAL) 10 MG tablet take 1 tablet by mouth three times a day 07/20/16  Yes Ward Givens, NP  carbamazepine (TEGRETOL) 200 MG tablet Take 0.5 tablets (100 mg total) by mouth 2 (two) times daily. 02/18/16  Yes Rainey Pines, MD  clonazePAM (KLONOPIN) 0.5 MG tablet Take 1 tablet (0.5 mg total) by mouth daily. 03/17/16  Yes Rainey Pines, MD  DULoxetine (CYMBALTA) 60 MG capsule Take 1 capsule (60 mg total) by mouth daily. 02/18/16  Yes Rainey Pines, MD  estradiol (ESTRACE) 1 MG tablet take 1 tablet by mouth once daily 09/01/15  Yes Shelly Bombard, MD  gabapentin (NEURONTIN) 800 MG tablet Take 1 tablet (800 mg total)  by mouth 4 (four) times daily. 02/18/16  Yes Rainey Pines, MD  losartan-hydrochlorothiazide (HYZAAR) 50-12.5 MG tablet Take 1 tablet by mouth daily. 11/30/14  Yes [provider]  meloxicam (MOBIC) 15 MG tablet  07/04/15  Yes [provider]  pramipexole (MIRAPEX) 0.25 MG tablet take 1 tablet by mouth DURING THE DAY and 3 tablets AT NIGHT 07/21/15  Yes Kathrynn Ducking, MD  rosuvastatin (CRESTOR) 40 MG tablet Take 1 tablet by mouth daily. 03/23/16  Yes [provider]    ROS:  Out of a complete 14 system review of symptoms, the patient complains only of the following symptoms, and all other reviewed systems are negative.   Runny nose  Eye discharge, eye itching, eye redness Heart murmur Heat intolerance, excessive thirst Restless legs, daytime sleepiness, frequent waking Environmental  allergies Frequency of urination Joint pain, joint swelling, back pain Bruising easily Memory loss Decreased concentration, agitation  Blood pressure 115/68, pulse 66, weight 231 lb 8 oz (105 kg).  Physical Exam  General: The patient is alert and cooperative at the time of the examination. The patient is markedly obese.   Skin: No significant peripheral edema is noted.   Neurologic Exam  Mental status: The patient is alert and oriented x 3 at the time of the examination. The patient has apparent normal recent and remote memory, with an apparently normal attention span and concentration ability.   Cranial nerves: Facial symmetry is present. Speech is normal, no aphasia or dysarthria is noted. Extraocular movements are full. Visual fields are full. The patient has a head nod tremor.   Motor: The patient has good strength in all 4 extremities.  Sensory examination: Soft touch sensation is symmetric on the face and arms, the patient reports some decrease sensation on the right leg as compared to the left.  Coordination: The patient has good finger-nose-finger and heel-to-shin bilaterally.  Gait and station: The patient has a normal gait. Tandem gait is minimally unsteady. Romberg is negative. No drift is seen.  Reflexes: Deep tendon reflexes are symmetric, but are depressed .   Assessment/Plan:  1. Restless leg syndrome  2. Chronic low back pain  The patient will be sent for an epidural steroid injection of the low back. We will go up on the Mirapex dosing taking 0.5 mg in the morning and 1 mg in the evening. The patient may improve with the restless leg syndrome when she comes off of the Abilify. She will follow-up in 6 months.   Jill Alexanders MD 07/23/2016 9:52 AM  [facility] [address] [place], [place] [address]  Phone [phone] Fax [phone]

## 2016-07-23 NOTE — Patient Instructions (Signed)
   We will go up on the mirapex to 0.5 mg in the morning and 1.0 mg in the evening.  We will get an epidural steroid injection on the low back.

## 2016-07-26 ENCOUNTER — Other Ambulatory Visit: Payer: Self-pay | Admitting: Neurology

## 2016-07-26 DIAGNOSIS — M545 Low back pain: Principal | ICD-10-CM

## 2016-07-26 DIAGNOSIS — G8929 Other chronic pain: Secondary | ICD-10-CM

## 2016-08-03 ENCOUNTER — Ambulatory Visit
Admission: RE | Admit: 2016-08-03 | Discharge: 2016-08-03 | Disposition: A | Discharge status: Home / Self Care | Payer: Medicare Other | Source: Ambulatory Visit | Attending: Neurology | Admitting: Neurology

## 2016-08-03 DIAGNOSIS — M545 Low back pain: Principal | ICD-10-CM

## 2016-08-03 DIAGNOSIS — G8929 Other chronic pain: Secondary | ICD-10-CM

## 2016-08-03 MED ORDER — IOPAMIDOL (ISOVUE-M 200) INJECTION 41%
1.0000 mL | Freq: Once | INTRAMUSCULAR | Status: AC
  Administered 2016-08-03: 1 mL via EPIDURAL

## 2016-08-03 MED ORDER — METHYLPREDNISOLONE ACETATE 40 MG/ML INJ SUSP (RADIOLOG
120.0000 mg | Freq: Once | INTRAMUSCULAR | Status: AC
  Administered 2016-08-03: 120 mg via EPIDURAL

## 2016-08-03 NOTE — Discharge Instructions (Signed)

## 2016-08-14 ENCOUNTER — Other Ambulatory Visit: Payer: Self-pay | Admitting: Neurology

## 2016-09-12 ENCOUNTER — Other Ambulatory Visit: Payer: Self-pay | Admitting: Obstetrics

## 2016-09-13 ENCOUNTER — Other Ambulatory Visit: Payer: Self-pay | Admitting: Obstetrics

## 2016-09-22 ENCOUNTER — Telehealth: Payer: Self-pay | Admitting: Neurology

## 2016-09-22 MED ORDER — CLONAZEPAM 0.5 MG PO TABS: 0.5000 mg | ORAL_TABLET | Freq: Every day | ORAL | 1 refills | Status: DC

## 2016-09-22 NOTE — Addendum Note (Signed)
Addended by: Kathrynn Ducking on: 09/22/2016 06:05 PM   Modules accepted: Orders

## 2016-09-22 NOTE — Telephone Encounter (Signed)
The patient is changing psychiatrists, I will write a small prescription for her clonazepam for now.

## 2016-09-22 NOTE — Telephone Encounter (Signed)
Patient called office stating she is in between psychiatrist and is out of her Klonopin and would like to know if Dr. Jannifer Franklin is able to fill it for her until she finds a Dr.  Please call

## 2016-09-23 NOTE — Telephone Encounter (Signed)
Faxed printed/signed rx clonazepam to pt pharmacy. Fax: [phone]. Received confirmation.

## 2016-10-11 ENCOUNTER — Other Ambulatory Visit: Payer: Self-pay | Admitting: Primary Care

## 2016-10-11 DIAGNOSIS — Z1231 Encounter for screening mammogram for malignant neoplasm of breast: Secondary | ICD-10-CM

## 2016-10-26 ENCOUNTER — Ambulatory Visit
Admission: RE | Admit: 2016-10-26 | Discharge: 2016-10-26 | Disposition: A | Discharge status: Home / Self Care | Payer: Medicare Other | Source: Ambulatory Visit | Attending: Primary Care | Admitting: Primary Care

## 2016-10-26 DIAGNOSIS — Z1231 Encounter for screening mammogram for malignant neoplasm of breast: Secondary | ICD-10-CM

## 2016-11-29 ENCOUNTER — Telehealth: Payer: Self-pay | Admitting: *Deleted

## 2016-11-29 NOTE — Telephone Encounter (Signed)
Received fax notification from [facility], [place] that patient received FLUARIZ QUAD 2018-2019 syringe 0.89mL IM right deltoid, manufacturer: [id number]. Date administered: 11/26/16. Lot: [id number]. Expiration: 09/04/2017. Sent copy to be scanned by medical records.

## 2016-12-19 ENCOUNTER — Emergency Department
Admission: EM | Admit: 2016-12-19 | Discharge: 2016-12-19 | Disposition: A | Discharge status: Home / Self Care | Payer: Medicare Other | Attending: Emergency Medicine | Admitting: Emergency Medicine

## 2016-12-19 ENCOUNTER — Encounter: Payer: Self-pay | Admitting: Emergency Medicine

## 2016-12-19 DIAGNOSIS — Y939 Activity, unspecified: Secondary | ICD-10-CM | POA: Diagnosis not present

## 2016-12-19 DIAGNOSIS — T63421A Toxic effect of venom of ants, accidental (unintentional), initial encounter: Secondary | ICD-10-CM

## 2016-12-19 DIAGNOSIS — Y999 Unspecified external cause status: Secondary | ICD-10-CM | POA: Diagnosis not present

## 2016-12-19 DIAGNOSIS — X58XXXA Exposure to other specified factors, initial encounter: Secondary | ICD-10-CM | POA: Diagnosis not present

## 2016-12-19 DIAGNOSIS — Y929 Unspecified place or not applicable: Secondary | ICD-10-CM | POA: Insufficient documentation

## 2016-12-19 DIAGNOSIS — I10 Essential (primary) hypertension: Secondary | ICD-10-CM | POA: Insufficient documentation

## 2016-12-19 DIAGNOSIS — Z79899 Other long term (current) drug therapy: Secondary | ICD-10-CM | POA: Insufficient documentation

## 2016-12-19 DIAGNOSIS — S90862A Insect bite (nonvenomous), left foot, initial encounter: Secondary | ICD-10-CM | POA: Diagnosis present

## 2016-12-19 MED ORDER — PREDNISONE 10 MG (21) PO TBPK: ORAL_TABLET | ORAL | 0 refills | Status: DC

## 2016-12-19 NOTE — ED Triage Notes (Signed)
Pt c/o stepping on ant hill Friday and pain/bites to left foot.  Tried itch cream at home but bites are hurting.

## 2016-12-19 NOTE — ED Provider Notes (Signed)
[facility] Emergency Department Provider Note  ____________________________________________   First MD Initiated Contact with Patient 12/19/16 [phone]     (approximate)  I have reviewed the triage vital signs and the nursing notes.   HISTORY  Chief Complaint Insect Bite    HPI Rachel Luna is a 61 y.o. female States she stepped on an ant hill yesterday.  Has several bites on her feet. These are very itchy. Denies fever chills. No other complaints. Review of systems was negative.   Past Medical History:  Diagnosis Date  . Arthritis   . Bipolar disorder ([place])   . chronic low back pain   . Chronic low back pain 02/18/2014  . Depression   . Fibroids   . GERD (gastroesophageal reflux disease)   . Gout   . Hyperlipidemia   . Hypertension   . Obesity     Patient Active Problem List   Diagnosis Date Noted  . Ingrown toenail 04/15/2016  . Arthritis of foot, degenerative 04/10/2015  . 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 ANA positive 03/05/2015  . Foot pain 03/05/2015  . Elevated rheumatoid factor 03/05/2015  . BP (high blood pressure) 09/04/2014  . Avitaminosis D 09/04/2014  . Dizzy 07/31/2014  . Chronic low back pain 02/18/2014  . Pain in joint, lower leg 08/10/2013  . Acid reflux 03/12/2013  . Restless legs syndrome (RLS) 02/16/2013  . Disturbance of skin sensation 02/16/2013  . Menopausal hot flushes 08/14/2012  . Arthritis of knee, degenerative 04/06/2011  . Restless leg 11/11/2010  . Benign essential HTN 07/13/2010  . Elevated fasting blood sugar 07/13/2010  . Cannot sleep 07/13/2010  . Chronic pain 01/08/2010  . Affective disorder, major 11/05/2009  . Major depressive disorder 11/05/2009  . Allergic rhinitis 07/03/2009    Past Surgical History:  Procedure Laterality Date  . ABDOMINAL HYSTERECTOMY    . CHOLECYSTECTOMY    . FOOT SURGERY Left   . KNEE ARTHROPLASTY    . OVARIAN CYST REMOVAL    . SPINE SURGERY       Prior to Admission medications   Medication Sig Start Date End Date Taking? Authorizing Provider  ARIPiprazole (ABILIFY) 5 MG tablet Take 1 tablet (5 mg total) by mouth daily. 03/17/16   Rainey Pines, MD  baclofen (LIORESAL) 10 MG tablet take 1 tablet by mouth three times a day 07/20/16   Ward Givens, NP  carbamazepine (TEGRETOL) 200 MG tablet Take 0.5 tablets (100 mg total) by mouth 2 (two) times daily. 02/18/16   Rainey Pines, MD  clonazePAM (KLONOPIN) 0.5 MG tablet Take 1 tablet (0.5 mg total) by mouth daily. 09/22/16   Kathrynn Ducking, MD  DULoxetine (CYMBALTA) 60 MG capsule Take 1 capsule (60 mg total) by mouth daily. 02/18/16   Rainey Pines, MD  estradiol (ESTRACE) 1 MG tablet take 1 tablet by mouth once daily 09/13/16   Shelly Bombard, MD  gabapentin (NEURONTIN) 800 MG tablet Take 1 tablet (800 mg total) by mouth 4 (four) times daily. 02/18/16   Rainey Pines, MD  losartan-hydrochlorothiazide (HYZAAR) 50-12.5 MG tablet Take 1 tablet by mouth daily. 11/30/14   [provider]  meloxicam (MOBIC) 15 MG tablet  07/04/15   [provider]  pramipexole (MIRAPEX) 0.5 MG tablet One tablet in the morning and 2 in the evening 07/23/16   Kathrynn Ducking, MD  predniSONE (STERAPRED UNI-PAK 21 TAB) 10 MG (21) TBPK tablet Take 6 pills on day one then decrease by 1  pill each day 12/19/16   Versie Starks, PA-C  rosuvastatin (CRESTOR) 40 MG tablet Take 1 tablet by mouth daily. 03/23/16   [provider]    Allergies Wellbutrin [bupropion]; Geodon [ziprasidone hcl]; Lisinopril; and Neupro [rotigotine]  Family History  Problem Relation Age of Onset  . Diabetes Mother   . Cancer Mother        breast  . Heart disease Mother   . Mental illness Mother        bipolar disorder  . Stroke Mother   . Breast cancer Mother   . Diabetes Father   . Hypertension Father   . Diabetes Brother   . Bipolar disorder Brother   . Diabetes Brother   . Breast cancer Maternal Aunt    . Breast cancer Maternal Grandmother     Social History Social History  Substance Use Topics  . Smoking status: Never Smoker  . Smokeless tobacco: Never Used  . Alcohol use No    Review of Systems  Constitutional: No fever/chills Eyes: No visual changes. ENT: No sore throat. Respiratory: Denies cough Genitourinary: Negative for dysuria. Musculoskeletal: Negative for back pain. Skin: positive for bug bites    ____________________________________________   PHYSICAL EXAM:  VITAL SIGNS: ED Triage Vitals  Enc Vitals Group     BP 12/19/16 0746 122/71     Pulse Rate 12/19/16 0746 72     Resp 12/19/16 0746 20     Temp 12/19/16 0746 (!) 97.4 F (36.3 C)     Temp Source 12/19/16 0746 Oral     SpO2 12/19/16 0746 95 %     Weight 12/19/16 0747 220 lb (99.8 kg)     Height 12/19/16 0747 5\' 4"  (1.626 m)     Head Circumference --      Peak Flow --      Pain Score 12/19/16 0745 9     Pain Loc --      Pain Edu? --      Excl. in [place]? --    Constitutional: Alert and oriented. Well appearing and in no acute distress. Eyes: Conjunctivae are normal.  Head: Atraumatic. Nose: No congestion/rhinnorhea. Mouth/Throat: Mucous membranes are moist.   Cardiovascular: Normal rate, regular rhythm. Respiratory: Normal respiratory effort.  No retractions GU: deferred Musculoskeletal: FROM all extremities, warm and well perfused Neurologic:  Normal speech and language.  Skin:  Skin is warm, dry .  Several small pustules noted on both feet. Small amount of redness. Psychiatric: Mood and affect are normal. Speech and behavior are normal.  ____________________________________________   LABS (all labs ordered are listed, but only abnormal results are displayed)  Labs Reviewed - No data to display ____________________________________________   ____________________________________________  RADIOLOGY  none  ____________________________________________   PROCEDURES  Procedure(s)  performed: none      ____________________________________________   INITIAL IMPRESSION / ASSESSMENT AND PLAN / ED COURSE  Pertinent labs & imaging results that were available during my care of the patient were reviewed by me and considered in my medical decision making (see chart for details).  Patient is 61 year old female with several ant bites to both feet. Patient has not a diabetic so we'll prescribe steroids for the itching and inflammation.Patient is to use over-the-counter antibiotic ointment as needed.      ____________________________________________   FINAL CLINICAL IMPRESSION(S) / ED DIAGNOSES  Final diagnoses:  Fire ant bite, accidental or unintentional, initial encounter      NEW MEDICATIONS STARTED DURING THIS VISIT:  Discharge Medication List  as of 12/19/2016  8:49 AM    START taking these medications   Details  predniSONE (STERAPRED UNI-PAK 21 TAB) 10 MG (21) TBPK tablet Take 6 pills on day one then decrease by 1 pill each day, Print         Note:  This document was prepared using Dragon voice recognition software and may include unintentional dictation errors.    Versie Starks, PA-C 12/19/16 1110    Lavonia Drafts, MD 12/19/16 [phone]

## 2016-12-19 NOTE — ED Notes (Signed)
Pt verbalizes understanding of d/c instructions, medications and follow up 

## 2016-12-19 NOTE — Discharge Instructions (Signed)
Use medication as prescribed. Do not pop the areas. Apply a small amount of antibiotic ointment if the area is open. Follow-up with your doctor if not better in 3-5 days. Warm water soaks may help.

## 2017-01-25 ENCOUNTER — Encounter (INDEPENDENT_AMBULATORY_CARE_PROVIDER_SITE_OTHER): Payer: Self-pay

## 2017-01-25 ENCOUNTER — Encounter: Payer: Self-pay | Admitting: Neurology

## 2017-01-25 ENCOUNTER — Ambulatory Visit (INDEPENDENT_AMBULATORY_CARE_PROVIDER_SITE_OTHER): Payer: Medicare Other | Admitting: Neurology

## 2017-01-25 VITALS — BP 151/109 | HR 74 | Ht 64.0 in | Wt 268.0 lb

## 2017-01-25 DIAGNOSIS — G2581 Restless legs syndrome: Secondary | ICD-10-CM

## 2017-01-25 DIAGNOSIS — G8929 Other chronic pain: Secondary | ICD-10-CM | POA: Diagnosis not present

## 2017-01-25 DIAGNOSIS — M545 Low back pain: Secondary | ICD-10-CM | POA: Diagnosis not present

## 2017-01-25 MED ORDER — CLONAZEPAM 0.5 MG PO TABS: 0.5000 mg | ORAL_TABLET | Freq: Two times a day (BID) | ORAL | 0 refills | Status: DC | PRN

## 2017-01-25 NOTE — Patient Instructions (Signed)
   We will set up an epidural steroid injection.

## 2017-01-25 NOTE — Progress Notes (Signed)
Reason for visit: Restless leg syndrome, chronic low back pain  Rachel Luna is an 61 y.o. female  History of present illness:  Rachel Luna is a 61 year old right-handed white female with a history of bipolar disorder and obesity.  The patient has had prior lumbosacral spine surgery through [facility].  The patient has had a flareup of her low back pain within the last 3-4 days as she was moving furniture for a friend.  The patient has pain in the back, with discomfort down the right leg but also now is having some pain down the left leg as well.  She denies any weakness of the extremities.  The patient is on Mirapex for her restless leg syndrome which seems to help.  The patient still has not made contact with a new psychiatrist.  She is having increased problems with anxiety and depression during the winter months.  The patient has baclofen to take as a muscle relaxant for her back.  She returns to this office for an evaluation.  The patient did have an epidural steroid injection in May 2018 which seemed to help significantly.  Past Medical History:  Diagnosis Date  . Arthritis   . Bipolar disorder ([place])   . chronic low back pain   . Chronic low back pain 02/18/2014  . Depression   . Fibroids   . GERD (gastroesophageal reflux disease)   . Gout   . Hyperlipidemia   . Hypertension   . Obesity     Past Surgical History:  Procedure Laterality Date  . ABDOMINAL HYSTERECTOMY    . CHOLECYSTECTOMY    . FOOT SURGERY Left   . KNEE ARTHROPLASTY    . OVARIAN CYST REMOVAL    . SPINE SURGERY      Family History  Problem Relation Age of Onset  . Diabetes Mother   . Cancer Mother        breast  . Heart disease Mother   . Mental illness Mother        bipolar disorder  . Stroke Mother   . Breast cancer Mother   . Diabetes Father   . Hypertension Father   . Diabetes Brother   . Bipolar disorder Brother   . Diabetes Brother   . Breast cancer Maternal Aunt   . Breast cancer  Maternal Grandmother     Social history:  reports that  has never smoked. she has never used smokeless tobacco. She reports that she does not drink alcohol or use drugs.    Allergies  Allergen Reactions  . Wellbutrin [Bupropion] Other (See Comments)    Irritability and Agitation  . Geodon [Ziprasidone Hcl] Other (See Comments)    Worsened Restless Leg Syndrome  . Lisinopril Cough  . Neupro [Rotigotine] Rash    Medications:  Prior to Admission medications   Medication Sig Start Date End Date Taking? Authorizing Provider  baclofen (LIORESAL) 10 MG tablet take 1 tablet by mouth three times a day 07/20/16  Yes Rachel Givens, NP  carbamazepine (TEGRETOL) 200 MG tablet Take 0.5 tablets (100 mg total) by mouth 2 (two) times daily. 02/18/16  Yes Rachel Pines, MD  DULoxetine (CYMBALTA) 60 MG capsule Take 1 capsule (60 mg total) by mouth daily. 02/18/16  Yes Rachel Pines, MD  estradiol (ESTRACE) 1 MG tablet take 1 tablet by mouth once daily 09/13/16  Yes Rachel Bombard, MD  gabapentin (NEURONTIN) 800 MG tablet Take 1 tablet (800 mg total) by mouth 4 (four)  times daily. 02/18/16  Yes Rachel Pines, MD  losartan-hydrochlorothiazide (HYZAAR) 50-12.5 MG tablet Take 1 tablet by mouth daily. 11/30/14  Yes [provider]  meloxicam (MOBIC) 15 MG tablet Take 15 mg daily by mouth.  07/04/15  Yes [provider]  pramipexole (MIRAPEX) 0.5 MG tablet One tablet in the morning and 2 in the evening 07/23/16  Yes Rachel Ducking, MD  rosuvastatin (CRESTOR) 40 MG tablet Take 1 tablet by mouth daily. 03/23/16  Yes [provider]    ROS:  Out of a complete 14 system review of symptoms, the patient complains only of the following symptoms, and all other reviewed systems are negative.  Heart murmur Heat intolerance, excessive thirst Rectal bleeding Restless legs, frequent waking, daytime sleepiness Joint pain, joint swelling, back pain, muscle cramps, walking difficulty Bruising  easily Agitation, decreased concentration, depression, anxiety  Blood pressure (!) 151/109, pulse 74, height 5\' 4"  (1.626 m), weight 268 lb (121.6 kg).  Physical Exam  General: The patient is alert and cooperative at the time of the examination.  The patient is moderately to markedly obese.  Neuromuscular: The patient is able to flex only to about 90 degrees with a low back.  Skin: No significant peripheral edema is noted.   Neurologic Exam  Mental status: The patient is alert and oriented x 3 at the time of the examination. The patient has apparent normal recent and remote memory, with an apparently normal attention span and concentration ability.   Cranial nerves: Facial symmetry is present. Speech is normal, no aphasia or dysarthria is noted. Extraocular movements are full. Visual fields are full.  Motor: The patient has good strength in all 4 extremities.  Sensory examination: Soft touch sensation is symmetric on the face and arms, the patient has some decrease in soft touch sensation on the right leg as compared to the left.  Coordination: The patient has good finger-nose-finger and heel-to-shin bilaterally.  Gait and station: The patient has a normal gait. Tandem gait is normal. Romberg is negative. No drift is seen.  Reflexes: Deep tendon reflexes are symmetric, but are depressed.   Assessment/Plan:  1.  Restless leg syndrome  2.  Chronic low back pain with recent exacerbation  3.  Bipolar disorder  4.  History of heroin overdose  The patient still has not made contact with a new psychiatrist.  She requests a prescription for her clonazepam, the [facility] registry was checked, the last time she got it was through this office in July 2018.  A prescription for 30 clonazepam with no refills was given.  The patient will be set up for an epidural steroid injection on the low back.  She will follow-up in 6 months.  The Mirapex seems to be effective for her restless leg  syndrome.  The patient had been on Abilify in the past, she indicates that she is not taking it currently.  This alone may have helped the restless leg syndrome.  Rachel Alexanders MD 01/25/2017 8:04 AM  [facility] [address] [place], [place] [address]  Phone [phone] Fax [phone]

## 2017-01-31 ENCOUNTER — Other Ambulatory Visit: Payer: Self-pay | Admitting: Neurology

## 2017-01-31 DIAGNOSIS — G8929 Other chronic pain: Secondary | ICD-10-CM

## 2017-01-31 DIAGNOSIS — M545 Low back pain: Principal | ICD-10-CM

## 2017-02-02 ENCOUNTER — Ambulatory Visit
Admission: RE | Admit: 2017-02-02 | Discharge: 2017-02-02 | Disposition: A | Discharge status: Home / Self Care | Payer: Medicare Other | Source: Ambulatory Visit | Attending: Neurology | Admitting: Neurology

## 2017-02-02 DIAGNOSIS — G8929 Other chronic pain: Secondary | ICD-10-CM

## 2017-02-02 DIAGNOSIS — M545 Low back pain: Principal | ICD-10-CM

## 2017-02-02 MED ORDER — METHYLPREDNISOLONE ACETATE 40 MG/ML INJ SUSP (RADIOLOG
120.0000 mg | Freq: Once | INTRAMUSCULAR | Status: AC
  Administered 2017-02-02: 120 mg via EPIDURAL

## 2017-02-02 MED ORDER — IOPAMIDOL (ISOVUE-M 200) INJECTION 41%
1.0000 mL | Freq: Once | INTRAMUSCULAR | Status: AC
  Administered 2017-02-02: 1 mL via EPIDURAL

## 2017-02-02 NOTE — Discharge Instructions (Signed)

## 2017-03-03 ENCOUNTER — Other Ambulatory Visit: Payer: Self-pay | Admitting: Psychiatry

## 2017-03-16 ENCOUNTER — Telehealth: Payer: Self-pay | Admitting: Neurology

## 2017-03-16 MED ORDER — PRAMIPEXOLE DIHYDROCHLORIDE 0.75 MG PO TABS
ORAL_TABLET | ORAL | 2 refills | Status: DC
Start: 2017-03-16 — End: 2017-09-30

## 2017-03-16 NOTE — Telephone Encounter (Signed)
Pt has called re: pramipexole (MIRAPEX) 0.5 MG tablet she states she is now taking 3 of the pills at night and the 1 in the morning and it is not helping much.  Pt would like to know if something else could be called in for her.  Please call

## 2017-03-16 NOTE — Addendum Note (Signed)
Addended by: Kathrynn Ducking on: 03/16/2017 01:10 PM   Modules accepted: Orders

## 2017-03-16 NOTE — Telephone Encounter (Signed)
I called the patient.  Her usual dose of Mirapex taking 0.5 mg in the morning and 1 mg at night is no longer effective, we will go up on the medication taking 0.75 mg, 1 in the morning and 2 in the evening.  A prescription was sent in.

## 2017-04-08 ENCOUNTER — Telehealth: Payer: Self-pay | Admitting: Neurology

## 2017-04-08 NOTE — Telephone Encounter (Signed)
Called patient back. Scheduled appt for 04/12/17 at 930am, check in 900am. Pt verbalized understanding and appreciation for call.

## 2017-04-08 NOTE — Telephone Encounter (Signed)
Pt called with complaint of heaviness in the arms, decreased strength, increasing at night for the past couple of weeks. She said she had appt with her PCP on 2/11, I advised her to start with the PCP and if he/she felt she needed to see neurologist she could call. She was very [employer]. FYI

## 2017-04-08 NOTE — Telephone Encounter (Signed)
I called the patient.  The patient indicates that she has had a recent change in her functional level.  She is having discomfort and pain down both arms, she has a feeling of heaviness in the arms, decreased grip strength.  She may have some slight tingling in the hands at times, some neck pain.  The patient is on Crestor, but these changes have occurred just in the last 2 or 3 weeks.  I will set the patient up for an evaluation next week.

## 2017-04-09 ENCOUNTER — Encounter (HOSPITAL_COMMUNITY): Payer: Self-pay | Admitting: Emergency Medicine

## 2017-04-09 ENCOUNTER — Emergency Department (HOSPITAL_COMMUNITY)
Admission: EM | Admit: 2017-04-09 | Discharge: 2017-04-09 | Disposition: A | Discharge status: Home / Self Care | Payer: Medicare Other | Attending: Emergency Medicine | Admitting: Emergency Medicine

## 2017-04-09 ENCOUNTER — Emergency Department (HOSPITAL_COMMUNITY): Payer: Medicare Other

## 2017-04-09 DIAGNOSIS — M62838 Other muscle spasm: Secondary | ICD-10-CM | POA: Diagnosis not present

## 2017-04-09 DIAGNOSIS — I10 Essential (primary) hypertension: Secondary | ICD-10-CM | POA: Diagnosis not present

## 2017-04-09 DIAGNOSIS — R600 Localized edema: Secondary | ICD-10-CM

## 2017-04-09 DIAGNOSIS — W5501XA Bitten by cat, initial encounter: Secondary | ICD-10-CM | POA: Insufficient documentation

## 2017-04-09 DIAGNOSIS — E876 Hypokalemia: Secondary | ICD-10-CM

## 2017-04-09 DIAGNOSIS — M7989 Other specified soft tissue disorders: Secondary | ICD-10-CM

## 2017-04-09 DIAGNOSIS — M25579 Pain in unspecified ankle and joints of unspecified foot: Secondary | ICD-10-CM

## 2017-04-09 DIAGNOSIS — Z79899 Other long term (current) drug therapy: Secondary | ICD-10-CM | POA: Diagnosis not present

## 2017-04-09 DIAGNOSIS — M79672 Pain in left foot: Secondary | ICD-10-CM | POA: Diagnosis present

## 2017-04-09 DIAGNOSIS — M25572 Pain in left ankle and joints of left foot: Secondary | ICD-10-CM | POA: Diagnosis not present

## 2017-04-09 DIAGNOSIS — R2242 Localized swelling, mass and lump, left lower limb: Secondary | ICD-10-CM | POA: Diagnosis not present

## 2017-04-09 LAB — CBC WITH DIFFERENTIAL/PLATELET
BASOS ABS: 0 10*3/uL (ref 0.0–0.1)
BASOS PCT: 0 %
EOS PCT: 1 %
Eosinophils Absolute: 0.1 10*3/uL (ref 0.0–0.7)
HCT: 38.8 % (ref 36.0–46.0)
Hemoglobin: 13.6 g/dL (ref 12.0–15.0)
Lymphocytes Relative: 37 %
Lymphs Abs: 3.7 10*3/uL (ref 0.7–4.0)
MCH: 29.8 pg (ref 26.0–34.0)
MCHC: 35.1 g/dL (ref 30.0–36.0)
MCV: 85.1 fL (ref 78.0–100.0)
MONO ABS: 0.9 10*3/uL (ref 0.1–1.0)
Monocytes Relative: 9 %
Neutro Abs: 5.3 10*3/uL (ref 1.7–7.7)
Neutrophils Relative %: 53 %
PLATELETS: 263 10*3/uL (ref 150–400)
RBC: 4.56 MIL/uL (ref 3.87–5.11)
RDW: 13.8 % (ref 11.5–15.5)
WBC: 10 10*3/uL (ref 4.0–10.5)

## 2017-04-09 LAB — BASIC METABOLIC PANEL
ANION GAP: 15 (ref 5–15)
BUN: 18 mg/dL (ref 6–20)
CALCIUM: 9.3 mg/dL (ref 8.9–10.3)
CO2: 23 mmol/L (ref 22–32)
Chloride: 100 mmol/L — ABNORMAL LOW (ref 101–111)
Creatinine, Ser: 0.65 mg/dL (ref 0.44–1.00)
Glucose, Bld: 140 mg/dL — ABNORMAL HIGH (ref 65–99)
Potassium: 2.7 mmol/L — CL (ref 3.5–5.1)
SODIUM: 138 mmol/L (ref 135–145)

## 2017-04-09 MED ORDER — ACETAMINOPHEN 500 MG PO TABS
1000.0000 mg | ORAL_TABLET | Freq: Once | ORAL | Status: AC
  Administered 2017-04-09: 1000 mg via ORAL
  Filled 2017-04-09: qty 2

## 2017-04-09 MED ORDER — DOXYCYCLINE HYCLATE 100 MG PO CAPS: 100.0000 mg | ORAL_CAPSULE | Freq: Two times a day (BID) | ORAL | 0 refills | Status: AC

## 2017-04-09 MED ORDER — POTASSIUM CHLORIDE CRYS ER 20 MEQ PO TBCR: 20.0000 meq | EXTENDED_RELEASE_TABLET | Freq: Two times a day (BID) | ORAL | 0 refills | Status: DC

## 2017-04-09 NOTE — ED Notes (Signed)
Bed: WA03 Expected date:  Expected time:  Means of arrival:  Comments: 

## 2017-04-09 NOTE — ED Notes (Signed)
Ace wrap has been applied to pt Left ankle before discharge.

## 2017-04-09 NOTE — ED Notes (Signed)
Patient transported to X-ray 

## 2017-04-09 NOTE — ED Notes (Signed)
Pt wants to leave and is very dramatic. I will inform ED provider.

## 2017-04-09 NOTE — ED Triage Notes (Addendum)
Patient here from home with complaints of left foot pain and swelling. Reports being scratched on the left foot by cat. States that he woke up this morning and had left arm pain. Denies injury.

## 2017-04-09 NOTE — ED Provider Notes (Signed)
[facility] DEPT Provider Note  CSN: [id number] Arrival date & time: 04/09/17 3235  Chief Complaint(s) Foot Pain and Arm Pain  HPI Rachel Luna is a 62 y.o. female who presents to the emergency department with 1 week of left foot pain and swelling.  This began after either Bite or mechanical fall.  She is unsure which of the triggers caused the swelling.  States that the fall occurred a week and a half ago.  States that she slipped on ice causing her to land on her knees and right side.  She began having left shoulder pain and several days later which is exacerbated with lying down or certain positions.  Alleviated by being upright.  Several days later she also noted left foot and ankle pain and swelling.  When she looked down she also noted some cat scratches/bites.  She denies any associated redness, fevers.  The pain in her ankle is exacerbated with ambulation and range of motion.  Alleviated by rest.  Denies any other alleviating or aggravating factors.  She denies any other associated symptoms.  HPI  Past Medical History Past Medical History:  Diagnosis Date  . Arthritis   . Bipolar disorder ([place])   . chronic low back pain   . Chronic low back pain 02/18/2014  . Depression   . Fibroids   . GERD (gastroesophageal reflux disease)   . Gout   . Hyperlipidemia   . Hypertension   . Obesity    Patient Active Problem List   Diagnosis Date Noted  . Ingrown toenail 04/15/2016  . Arthritis of foot, degenerative 04/10/2015  . 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 ANA positive 03/05/2015  . Foot pain 03/05/2015  . Elevated rheumatoid factor 03/05/2015  . BP (high blood pressure) 09/04/2014  . Avitaminosis D 09/04/2014  . Dizzy 07/31/2014  . Chronic low back pain 02/18/2014  . Pain in joint, lower leg 08/10/2013  . Acid reflux 03/12/2013  . Restless legs syndrome (RLS) 02/16/2013  . Disturbance of skin sensation 02/16/2013  . Menopausal hot  flushes 08/14/2012  . Arthritis of knee, degenerative 04/06/2011  . Restless leg 11/11/2010  . Benign essential HTN 07/13/2010  . Elevated fasting blood sugar 07/13/2010  . Cannot sleep 07/13/2010  . Chronic pain 01/08/2010  . Affective disorder, major 11/05/2009  . Major depressive disorder 11/05/2009  . Allergic rhinitis 07/03/2009   Home Medication(s) Prior to Admission medications   Medication Sig Start Date End Date Taking? Authorizing Provider  baclofen (LIORESAL) 10 MG tablet take 1 tablet by mouth three times a day 07/20/16  Yes Ward Givens, NP  carbamazepine (TEGRETOL) 200 MG tablet Take 0.5 tablets (100 mg total) by mouth 2 (two) times daily. Patient taking differently: Take 100 mg by mouth daily.  02/18/16  Yes Rainey Pines, MD  chlorhexidine (PERIDEX) 0.12 % solution USE 10 MLS IN MOUTH AND SWISH AND SPIT TWICE A DAY 03/25/17  Yes [provider]  DULoxetine (CYMBALTA) 60 MG capsule Take 1 capsule (60 mg total) by mouth daily. 02/18/16  Yes Rainey Pines, MD  estradiol (ESTRACE) 1 MG tablet take 1 tablet by mouth once daily 09/13/16  Yes Shelly Bombard, MD  gabapentin (NEURONTIN) 800 MG tablet Take 1 tablet (800 mg total) by mouth 4 (four) times daily. Patient taking differently: Take 800 mg by mouth 3 (three) times daily.  02/18/16  Yes Rainey Pines, MD  losartan-hydrochlorothiazide (HYZAAR) 50-12.5 MG tablet Take 1 tablet by mouth daily. 11/30/14  Yes [provider]  meloxicam (MOBIC) 15 MG tablet Take 15 mg daily by mouth.  07/04/15  Yes [provider]  pramipexole (MIRAPEX) 0.75 MG tablet 1 tablet in the morning, 2 in the evening 03/16/17  Yes Kathrynn Ducking, MD  rosuvastatin (CRESTOR) 40 MG tablet Take 1 tablet by mouth daily. 03/23/16  Yes [provider]  clonazePAM (KLONOPIN) 0.5 MG tablet Take 1 tablet (0.5 mg total) 2 (two) times daily as needed by mouth for anxiety. Patient not taking: Reported on 04/09/2017 01/25/17   Kathrynn Ducking, MD  doxycycline (VIBRAMYCIN) 100 MG capsule Take 1 capsule (100 mg total) by mouth 2 (two) times daily for 7 days. 04/09/17 04/16/17  Fatima Blank, MD                                                                                                                                    Past Surgical History Past Surgical History:  Procedure Laterality Date  . ABDOMINAL HYSTERECTOMY    . CHOLECYSTECTOMY    . FOOT SURGERY Left   . KNEE ARTHROPLASTY    . OVARIAN CYST REMOVAL    . SPINE SURGERY     Family History Family History  Problem Relation Age of Onset  . Diabetes Mother   . Cancer Mother        breast  . Heart disease Mother   . Mental illness Mother        bipolar disorder  . Stroke Mother   . Breast cancer Mother   . Diabetes Father   . Hypertension Father   . Diabetes Brother   . Bipolar disorder Brother   . Diabetes Brother   . Breast cancer Maternal Aunt   . Breast cancer Maternal Grandmother     Social History Social History   Tobacco Use  . Smoking status: Never Smoker  . Smokeless tobacco: Never Used  Substance Use Topics  . Alcohol use: No    Alcohol/week: 0.0 oz  . Drug use: No   Allergies Wellbutrin [bupropion]; Geodon [ziprasidone hcl]; Lisinopril; and Neupro [rotigotine]  Review of Systems Review of Systems All other systems are reviewed and are negative for acute change except as noted in the HPI  Physical Exam Vital Signs  I have reviewed the triage vital signs BP (!) 136/99 (BP Location: Right Arm)   Pulse (!) 115   Temp 98.3 F (36.8 C) (Oral)   Resp 16   Ht 5\' 4"  (1.626 m)   Wt 102.1 kg (225 lb)   SpO2 99%   BMI 38.62 kg/m   Physical Exam  Constitutional: She is oriented to person, place, and time. She appears well-developed and well-nourished. No distress.  HENT:  Head: Normocephalic and atraumatic.  Right Ear: External ear normal.  Left Ear: External ear normal.  Nose: Nose normal.  Eyes: Conjunctivae and EOM  are normal. No scleral icterus.  Neck:  Normal range of motion and phonation normal.  Cardiovascular: Normal rate and regular rhythm.  Pulmonary/Chest: Effort normal. No stridor. No respiratory distress.  Abdominal: She exhibits no distension.  Musculoskeletal: She exhibits no edema.       Left ankle: She exhibits swelling. She exhibits normal range of motion and normal pulse. Tenderness. Lateral malleolus, medial malleolus and AITFL tenderness found.       Cervical back: She exhibits tenderness and spasm.       Back:       Feet:  Neurological: She is alert and oriented to person, place, and time.  Skin: She is not diaphoretic.  Psychiatric: She has a normal mood and affect. Her behavior is normal.  Vitals reviewed.   ED Results and Treatments Labs (all labs ordered are listed, but only abnormal results are displayed) Labs Reviewed  CBC WITH DIFFERENTIAL/PLATELET  BASIC METABOLIC PANEL                                                                                                                         EKG  EKG Interpretation  Date/Time:    Ventricular Rate:    PR Interval:    QRS Duration:   QT Interval:    QTC Calculation:   R Axis:     Text Interpretation:        Radiology Dg Ankle Complete Left  Result Date: 04/09/2017 CLINICAL DATA:  Left foot pain and swelling. EXAM: LEFT ANKLE COMPLETE - 3+ VIEW COMPARISON:  None. FINDINGS: There is no evidence of fracture, dislocation, or joint effusion. There is no evidence of arthropathy or other focal bone abnormality. Diffuse soft tissue swelling about the ankle and foot. IMPRESSION: No acute fracture or dislocation identified about the left foot. Diffuse soft tissue swelling. Electronically Signed   By: Fidela Salisbury M.D.   On: 04/09/2017 11:00   Dg Foot 2 Views Left  Result Date: 04/09/2017 CLINICAL DATA:  Left foot pain and swelling. EXAM: LEFT FOOT - 2 VIEW COMPARISON:  03/13/2015 MRI left foot FINDINGS: There is no  acute fracture or dislocation. There is no periosteal reaction or bone destruction. There is severe osteoarthritis of the midfoot involving the navicular-medial and middle cuneiform joint, second tarsometatarsal joint, third tarsometatarsal joint, fourth tarsometatarsal joint and to lesser extent fifth tarsometatarsal joint. Calcification of the proximal plantar fascia. Plantar calcaneal enthesophyte. Enthesopathic changes at the Achilles tendon insertion. Severe soft tissue swelling along the dorsal aspect of the foot. IMPRESSION: 1.  No acute osseous injury of the left foot. 2. Advanced osteoarthritis of the midfoot similar in appearance to the prior exam of MRI left foot 03/13/2015. This may reflect changes secondary to underlying inflammatory arthropathy versus developing Charcot joint. Electronically Signed   By: Kathreen Devoid   On: 04/09/2017 11:16   Pertinent labs & imaging results that were available during my care of the patient were reviewed by me and considered in my medical decision making (see chart for details).  Medications  Ordered in ED Medications  acetaminophen (TYLENOL) tablet 1,000 mg (1,000 mg Oral Given 04/09/17 1129)                                                                                                                                    Procedures Procedures EMERGENCY DEPARTMENT US SOFT TISSUE INTERPRETATION "Study: Limited Soft Tissue Ultrasound"  INDICATIONS: Pain and Soft tissue infection Multiple views of the body part were obtained in real-time with a multi-frequency linear probe  PERFORMED BY: Myself IMAGES ARCHIVED?: Yes SIDE:Left BODY PART:Lower extremity INTERPRETATION:  soft tissue fluid collection/cobblestoning: cellulitis vs edema  (including critical care time)  Medical Decision Making / ED Course I have reviewed the nursing notes for this encounter and the patient's prior records (if available in EHR or on provided paperwork).     1. Foot pain  and swelling Sprain ankle versus cellulitis from cat bite.  CBC obtained without evidence of leukocytosis.  There is no erythema associated with the cat bites.  She does have associated tenderness to palpation.  Ultrasound revealed soft tissue cobblestoning consistent with either edema or cellulitis.  Plain film without acute fracture or dislocation.  I feel that her picture is more consistent with sprain ankle with associated edema but will treat for possible cellulitis.   2. Left shoulder/arm pain Highly consistent with muscle strain/spasm of the left trapezius muscle group.  Pain began several days after mechanical fall and she denied any trauma to this shoulder.  No indication for imaging at this time.  The patient appears reasonably screened and/or stabilized for discharge and I doubt any other medical condition or other [facility] requiring further screening, evaluation, or treatment in the ED at this time prior to discharge.  The patient is safe for discharge with strict return precautions.   Final Clinical Impression(s) / ED Diagnoses Final diagnoses:  Ankle pain  Foot swelling  Cat bite, initial encounter  Leg edema  Muscle spasm    Disposition: Discharge  Condition: Good  I have discussed the results, Dx and Tx plan with the patient who expressed understanding and agree(s) with the plan. Discharge instructions discussed at great length. The patient was given strict return precautions who verbalized understanding of the instructions. No further questions at time of discharge.    ED Discharge Orders        Ordered    doxycycline (VIBRAMYCIN) 100 MG capsule  2 times daily     04/09/17 1150       Follow Up: Rogers Blocker, MD [address] [place] [address] [phone]  Schedule an appointment as soon as possible for a visit  As needed    This chart was dictated using voice recognition software.  Despite best efforts to proofread,  errors can occur which can  change the documentation meaning.   Fatima Blank, MD 04/09/17 1201

## 2017-04-09 NOTE — Discharge Instructions (Addendum)
For your shoulder pain, you may use over-the-counter Motrin (Ibuprofen), Acetaminophen (Tylenol), topical muscle creams such as SalonPas, [employer], Bengay, etc. Please stretch, apply heat, and have massage therapy for additional assistance.

## 2017-04-09 NOTE — ED Notes (Signed)
Pt does go barefoot everywhere.

## 2017-04-09 NOTE — ED Notes (Signed)
Attempt to straight stick pt was unsuccessful. Pt is very dramatic and RN was unable to get Blood. Pt asked for phlebotomy to stick pt. Nurse Tech is currently in room attempting blood draw.

## 2017-04-12 ENCOUNTER — Encounter: Payer: Self-pay | Admitting: Neurology

## 2017-04-12 ENCOUNTER — Ambulatory Visit (INDEPENDENT_AMBULATORY_CARE_PROVIDER_SITE_OTHER): Payer: Medicare Other | Admitting: Neurology

## 2017-04-12 VITALS — BP 128/74 | HR 89 | Ht 64.0 in | Wt 268.0 lb

## 2017-04-12 DIAGNOSIS — R202 Paresthesia of skin: Secondary | ICD-10-CM | POA: Diagnosis not present

## 2017-04-12 DIAGNOSIS — E876 Hypokalemia: Secondary | ICD-10-CM | POA: Diagnosis not present

## 2017-04-12 DIAGNOSIS — R29898 Other symptoms and signs involving the musculoskeletal system: Secondary | ICD-10-CM | POA: Diagnosis not present

## 2017-04-12 DIAGNOSIS — E538 Deficiency of other specified B group vitamins: Secondary | ICD-10-CM | POA: Diagnosis not present

## 2017-04-12 MED ORDER — ALPRAZOLAM 0.5 MG PO TABS: ORAL_TABLET | ORAL | 0 refills | Status: DC

## 2017-04-12 NOTE — Patient Instructions (Signed)
   We will get MRI of the cervical spine. 

## 2017-04-12 NOTE — Progress Notes (Signed)
Reason for visit: Bilateral arm weakness and numbness  Rachel Luna is a 62 y.o. female  History of present illness:  Ms. Woodyard is a 62 year old right-handed white female with a history of bipolar disorder and chronic low back pain.  The patient comes in today for an evaluation on an urgent basis for a new problem.  The patient indicated that about 3 weeks ago she fell while trying to put something into her mailbox, she landed on her right side, she does not believe she hit her head.  The patient noted about a week after the fall that she began having paresthesias down both arms and weakness of both arms with discomfort in the neck and across the shoulders and down the arms.  The patient went to the emergency room on 09 April 2017 secondary to swelling in the feet.  She was found to have hypokalemia with a level of 2.7.  She is on hydrochlorothiazide.  The patient was placed on potassium supplementation, this seems to have improved some of her symptoms and arms as well with resolution of the numbness in the hands.  She still has some neck and shoulder discomfort.  She has noted some change in her balance over the last several days, she feels somewhat more clumsy, she has not had any further falls.  She denies issues controlling the bowels or the bladder.  The neck and shoulders feel tight.  She comes in for an evaluation.  Past Medical History:  Diagnosis Date  . Arthritis   . Bipolar disorder ([place])   . chronic low back pain   . Chronic low back pain 02/18/2014  . Depression   . Fibroids   . GERD (gastroesophageal reflux disease)   . Gout   . Hyperlipidemia   . Hypertension   . Obesity     Past Surgical History:  Procedure Laterality Date  . ABDOMINAL HYSTERECTOMY    . CHOLECYSTECTOMY    . FOOT SURGERY Left   . KNEE ARTHROPLASTY    . OVARIAN CYST REMOVAL    . SPINE SURGERY      Family History  Problem Relation Age of Onset  . Diabetes Mother   . Cancer Mother    breast  . Heart disease Mother   . Mental illness Mother        bipolar disorder  . Stroke Mother   . Breast cancer Mother   . Diabetes Father   . Hypertension Father   . Diabetes Brother   . Bipolar disorder Brother   . Diabetes Brother   . Breast cancer Maternal Aunt   . Breast cancer Maternal Grandmother     Social history:  reports that  has never smoked. she has never used smokeless tobacco. She reports that she does not drink alcohol or use drugs.  Medications:  Prior to Admission medications   Medication Sig Start Date End Date Taking? Authorizing Provider  baclofen (LIORESAL) 10 MG tablet take 1 tablet by mouth three times a day 07/20/16  Yes Ward Givens, NP  carbamazepine (TEGRETOL) 200 MG tablet Take 0.5 tablets (100 mg total) by mouth 2 (two) times daily. Patient taking differently: Take 100 mg by mouth daily.  02/18/16  Yes Rainey Pines, MD  chlorhexidine (PERIDEX) 0.12 % solution USE 10 MLS IN MOUTH AND SWISH AND SPIT TWICE A DAY 03/25/17  Yes [provider]  clonazePAM (KLONOPIN) 0.5 MG tablet Take 1 tablet (0.5 mg total) 2 (two) times daily as needed  by mouth for anxiety. 01/25/17  Yes Kathrynn Ducking, MD  doxycycline (VIBRAMYCIN) 100 MG capsule Take 1 capsule (100 mg total) by mouth 2 (two) times daily for 7 days. 04/09/17 04/16/17 Yes Cardama, Grayce Sessions, MD  DULoxetine (CYMBALTA) 60 MG capsule Take 1 capsule (60 mg total) by mouth daily. 02/18/16  Yes Rainey Pines, MD  estradiol (ESTRACE) 1 MG tablet take 1 tablet by mouth once daily 09/13/16  Yes Shelly Bombard, MD  gabapentin (NEURONTIN) 800 MG tablet Take 1 tablet (800 mg total) by mouth 4 (four) times daily. Patient taking differently: Take 800 mg by mouth 3 (three) times daily.  02/18/16  Yes Rainey Pines, MD  losartan-hydrochlorothiazide (HYZAAR) 50-12.5 MG tablet Take 1 tablet by mouth daily. 11/30/14  Yes [provider]  meloxicam (MOBIC) 15 MG tablet Take 15 mg daily by mouth.  07/04/15   Yes [provider]  potassium chloride SA (K-DUR,KLOR-CON) 20 MEQ tablet Take 1 tablet (20 mEq total) by mouth 2 (two) times daily for 14 days. 04/09/17 04/23/17 Yes Cardama, Grayce Sessions, MD  pramipexole (MIRAPEX) 0.75 MG tablet 1 tablet in the morning, 2 in the evening 03/16/17  Yes Kathrynn Ducking, MD  rosuvastatin (CRESTOR) 40 MG tablet Take 1 tablet by mouth daily. 03/23/16  Yes [provider]  ALPRAZolam Duanne Moron) 0.5 MG tablet Take 2 tablets approximately 45 minutes prior to the MRI study, take a third tablet if needed. 04/12/17   Kathrynn Ducking, MD      Allergies  Allergen Reactions  . Wellbutrin [Bupropion] Other (See Comments)    Irritability and Agitation  . Geodon [Ziprasidone Hcl] Other (See Comments)    Worsened Restless Leg Syndrome  . Lisinopril Cough  . Neupro [Rotigotine] Rash    ROS:  Out of a complete 14 system review of symptoms, the patient complains only of the following symptoms, and all other reviewed systems are negative.  Heat intolerance Leg swelling Restless legs, insomnia, frequent waking Joint pain, joint swelling, back pain, aching muscles, muscle cramps Agitation, depression  Blood pressure 128/74, pulse 89, height 5\' 4"  (1.626 m), weight 268 lb (121.6 kg).  Physical Exam  General: The patient is alert and cooperative at the time of the examination.  The patient is moderately to markedly obese.  Eyes: Pupils are equal, round, and reactive to light. Discs are flat bilaterally.  Neck: The neck is supple, no carotid bruits are noted.  Respiratory: The respiratory examination is clear.  Cardiovascular: The cardiovascular examination reveals a regular rate and rhythm, no obvious murmurs or rubs are noted.  Skin: Extremities are without significant edema.  Neurologic Exam  Mental status: The patient is alert and oriented x 3 at the time of the examination. The patient has apparent normal recent and remote memory, with an  apparently normal attention span and concentration ability.  Cranial nerves: Facial symmetry is present. There is good sensation of the face to pinprick and soft touch bilaterally. The strength of the facial muscles and the muscles to head turning and shoulder shrug are normal bilaterally. Speech is well enunciated, no aphasia or dysarthria is noted. Extraocular movements are full. Visual fields are full. The tongue is midline, and the patient has symmetric elevation of the soft palate. No obvious hearing deficits are noted.  Motor: The motor testing reveals 5 over 5 strength of all 4 extremities, with exception of slight weakness with the deltoid muscles, external rotation of the arms, and with supination of the arms bilaterally.  Good symmetric motor tone is noted throughout.  Sensory: Sensory testing is intact to pinprick, soft touch and vibration sensation on all 4 extremities. No evidence of extinction is noted.  Coordination: Cerebellar testing reveals good finger-nose-finger and heel-to-shin bilaterally.   Gait and station: Gait is normal. Tandem gait is slightly unsteady. Romberg is negative. No drift is seen.  Reflexes: Deep tendon reflexes are symmetric, but are somewhat depressed bilaterally. Toes are downgoing bilaterally.   Assessment/Plan:  1.  Bilateral upper extremity numbness and weakness  2.  Chronic low back pain  3.  Recent hypokalemia  The patient comes in with a new issue today.  Approximately 1 week after a fall she has developed some numbness and weakness of both arms.  The patient appears to have some slight weakness in the C5 distribution muscles on both arms.  The patient will be set up for blood work to look at the potassium levels again, she will have MRI evaluation of the cervical spine to exclude a mild central cord syndrome following a recent fall.  The patient will follow up for her scheduled revisit in May 2019.  Jill Alexanders MD 04/12/2017 9:58  AM  [facility] [address] [place], [place] [address]  Phone [phone] Fax [phone]

## 2017-04-13 ENCOUNTER — Telehealth: Payer: Self-pay | Admitting: Neurology

## 2017-04-13 LAB — COMPREHENSIVE METABOLIC PANEL
A/G RATIO: 1.9 (ref 1.2–2.2)
ALBUMIN: 4.1 g/dL (ref 3.5–5.5)
ALK PHOS: 81 IU/L (ref 39–117)
ALT: 45 IU/L — ABNORMAL HIGH (ref 0–32)
AST: 47 IU/L — AB (ref 0–40)
BUN / CREAT RATIO: 21 (ref 9–23)
BUN: 13 mg/dL (ref 6–24)
Bilirubin Total: 0.3 mg/dL (ref 0.0–1.2)
CALCIUM: 9.4 mg/dL (ref 8.7–10.2)
CO2: 30 mmol/L — ABNORMAL HIGH (ref 20–29)
Chloride: 100 mmol/L (ref 96–106)
Creatinine, Ser: 0.62 mg/dL (ref 0.57–1.00)
GFR calc Af Amer: 114 mL/min/{1.73_m2} (ref >59)
GFR, EST NON AFRICAN AMERICAN: 99 mL/min/{1.73_m2} (ref >59)
GLOBULIN, TOTAL: 2.2 g/dL (ref 1.5–4.5)
Glucose: 124 mg/dL — ABNORMAL HIGH (ref 65–99)
POTASSIUM: 3.5 mmol/L (ref 3.5–5.2)
SODIUM: 145 mmol/L — AB (ref 134–144)
Total Protein: 6.3 g/dL (ref 6.0–8.5)

## 2017-04-13 LAB — SEDIMENTATION RATE: Sed Rate: 8 mm/hr (ref 0–40)

## 2017-04-13 LAB — VITAMIN B12: VITAMIN B 12: 294 pg/mL (ref 232–1245)

## 2017-04-13 NOTE — Telephone Encounter (Signed)
I called the patient.  The blood work shows a slight elevation in the sodium level, potassium is now normal.  There is a slight elevation in SGOT and SGPT, the patient is on carbamazepine and a statin drug, we will need to follow this.  Otherwise, blood work is unremarkable.

## 2017-04-24 ENCOUNTER — Ambulatory Visit
Admission: RE | Admit: 2017-04-24 | Discharge: 2017-04-24 | Disposition: A | Discharge status: Home / Self Care | Payer: Medicare Other | Source: Ambulatory Visit | Attending: Neurology | Admitting: Neurology

## 2017-04-24 DIAGNOSIS — R202 Paresthesia of skin: Secondary | ICD-10-CM

## 2017-04-24 DIAGNOSIS — R29898 Other symptoms and signs involving the musculoskeletal system: Secondary | ICD-10-CM | POA: Diagnosis not present

## 2017-04-25 ENCOUNTER — Telehealth: Payer: Self-pay | Admitting: Neurology

## 2017-04-25 ENCOUNTER — Ambulatory Visit (INDEPENDENT_AMBULATORY_CARE_PROVIDER_SITE_OTHER): Payer: Medicare Other

## 2017-04-25 ENCOUNTER — Encounter: Payer: Self-pay | Admitting: Podiatry

## 2017-04-25 ENCOUNTER — Ambulatory Visit (INDEPENDENT_AMBULATORY_CARE_PROVIDER_SITE_OTHER): Payer: Medicare Other | Admitting: Podiatry

## 2017-04-25 DIAGNOSIS — M79672 Pain in left foot: Secondary | ICD-10-CM

## 2017-04-25 DIAGNOSIS — M19072 Primary osteoarthritis, left ankle and foot: Secondary | ICD-10-CM

## 2017-04-25 DIAGNOSIS — R609 Edema, unspecified: Secondary | ICD-10-CM

## 2017-04-25 MED ORDER — CEPHALEXIN 500 MG PO CAPS: 500.0000 mg | ORAL_CAPSULE | Freq: Three times a day (TID) | ORAL | 2 refills | Status: DC

## 2017-04-25 NOTE — Telephone Encounter (Signed)
  I called the patient.  The MRI of the cervical spine does not show significant issues with spinal cord injury or nerve root compression.  We discussed the possibility of physical therapy, but the patient has just had her foot wrapped by the podiatrist, when she is healed from this, she can call me and we can do physical therapy on the neck and shoulders in the future.  MRI cervical 04/25/17:  IMPRESSION:  This MRI of the cervical spine shows the following: 1.   At C5-C6, there is mild spinal stenosis and mild bilateral foraminal narrowing due to broad disc protrusion, mild uncovertebral spurring and facet hypertrophy. There is no nerve root compression. 2.   At C6-C7, there is borderline spinal stenosis due to disc bulging and mild uncovertebral spurring. There is no significant foraminal narrowing and no nerve root compression. 3.   The spinal cord appears normal.

## 2017-04-27 NOTE — Progress Notes (Signed)
Subjective: Ms. Zacher presents the office today for concerns of left foot swelling which is been ongoing for the last 2 weeks.  She states that she fell in her driveway on some gravel she had pain and swelling to the area.  She did go to the emergency room for this as well but is not getting any better.  She also had some redness to her leg she has noticed and this gets worse when it swells.  She denies any open sores or any scrapes or cuts.  She has no other concerns today. Denies any systemic complaints such as fevers, chills, nausea, vomiting. No acute changes since last appointment, and no other complaints at this time.   Objective: AAO x3, NAD DP/PT pulses palpable bilaterally, CRT less than 3 seconds Incision for the prior surgery is well-healed.  There is diffuse tenderness along the midfoot.  There is no specific area pinpoint bony tenderness and there is diffuse.  There is mild to moderate swelling to the left foot and ankle.  There is no pain to the ankle, Achilles tendon, plantar fascia, ankle ligaments are syndesmosis.  There is mild erythema to the anterior aspect the left leg she states this gets worse as it swells.  There is no open sores or drainage. No open lesions or pre-ulcerative lesions.  No pain with calf compression, swelling, warmth, erythema  Assessment: Left foot swelling, osteoarthritis, rule out fracture  Plan: -All treatment options discussed with the patient including all alternatives, risks, complications.  -X-rays were obtained reviewed.  Significant arthritic changes present in the midfoot but there is no definitive evidence of acute fracture identified however given the ongoing symptoms are difficult to evaluate. -Unna boot was applied today due to swelling.  Also short Cam boot was dispensed for mobilization given her pain level.  Prescribed Keflex due to the redness.  Discussed elevation. -Monitor for any clinical signs or symptoms of infection and directed to  call the office immediately should any occur or go to the ER. -Advised on precautions to cut the unna boot in 5 days or sooner if it is causing any issues. -If symptoms continue discussed MRI -Patient encouraged to call the office with any questions, concerns, change in symptoms.   Trula Slade DPM

## 2017-05-09 ENCOUNTER — Encounter: Payer: Self-pay | Admitting: Podiatry

## 2017-05-09 ENCOUNTER — Ambulatory Visit (INDEPENDENT_AMBULATORY_CARE_PROVIDER_SITE_OTHER): Payer: Medicare Other

## 2017-05-09 ENCOUNTER — Ambulatory Visit (INDEPENDENT_AMBULATORY_CARE_PROVIDER_SITE_OTHER): Payer: Medicare Other | Admitting: Podiatry

## 2017-05-09 DIAGNOSIS — M84375D Stress fracture, left foot, subsequent encounter for fracture with routine healing: Secondary | ICD-10-CM | POA: Diagnosis not present

## 2017-05-09 DIAGNOSIS — M779 Enthesopathy, unspecified: Secondary | ICD-10-CM

## 2017-05-09 DIAGNOSIS — M19072 Primary osteoarthritis, left ankle and foot: Secondary | ICD-10-CM

## 2017-05-09 DIAGNOSIS — R609 Edema, unspecified: Secondary | ICD-10-CM

## 2017-05-09 MED ORDER — METHYLPREDNISOLONE 4 MG PO TBPK: ORAL_TABLET | ORAL | 0 refills | Status: DC

## 2017-05-10 ENCOUNTER — Telehealth: Payer: Self-pay | Admitting: *Deleted

## 2017-05-10 DIAGNOSIS — M19072 Primary osteoarthritis, left ankle and foot: Secondary | ICD-10-CM

## 2017-05-10 DIAGNOSIS — M84375D Stress fracture, left foot, subsequent encounter for fracture with routine healing: Secondary | ICD-10-CM

## 2017-05-10 DIAGNOSIS — M779 Enthesopathy, unspecified: Secondary | ICD-10-CM

## 2017-05-10 LAB — C-REACTIVE PROTEIN: CRP: 14.9 mg/L — ABNORMAL HIGH (ref <8.0)

## 2017-05-10 LAB — CBC WITH DIFFERENTIAL/PLATELET
BASOS ABS: 63 {cells}/uL (ref 0–200)
Basophils Relative: 0.9 %
EOS ABS: 238 {cells}/uL (ref 15–500)
EOS PCT: 3.4 %
HEMATOCRIT: 39.5 % (ref 35.0–45.0)
Hemoglobin: 13.2 g/dL (ref 11.7–15.5)
LYMPHS ABS: 2681 {cells}/uL (ref 850–3900)
MCH: 28.6 pg (ref 27.0–33.0)
MCHC: 33.4 g/dL (ref 32.0–36.0)
MCV: 85.5 fL (ref 80.0–100.0)
MONOS PCT: 8.7 %
MPV: 10.4 fL (ref 7.5–12.5)
NEUTROS ABS: 3409 {cells}/uL (ref 1500–7800)
NEUTROS PCT: 48.7 %
Platelets: 260 10*3/uL (ref 140–400)
RBC: 4.62 10*6/uL (ref 3.80–5.10)
RDW: 14 % (ref 11.0–15.0)
Total Lymphocyte: 38.3 %
WBC mixed population: 609 cells/uL (ref 200–950)
WBC: 7 10*3/uL (ref 3.8–10.8)

## 2017-05-10 LAB — SEDIMENTATION RATE: Sed Rate: 6 mm/h (ref 0–30)

## 2017-05-10 LAB — URIC ACID: Uric Acid, Serum: 4.9 mg/dL (ref 2.5–7.0)

## 2017-05-10 NOTE — Telephone Encounter (Signed)
-----   Message from Trula Slade, DPM sent at 05/10/2017  6:44 AM EST ----- Can you order an MRI of the left foot to rule out stress fracture, tendon tear.  Repeat x-rays were negative and she has significant arthritis.  Potential surgical planning.  Thank you

## 2017-05-10 NOTE — Progress Notes (Signed)
Subjective: Rachel Luna presents the office today for follow-up evaluation of left foot pain and swelling as well as erythema.  She is on antibiotics and she states that her swelling has improved but it does continue.  She denies any redness or warmth today and the redness that she is having the legs has resolved.  She states that she cannot wear the cam boot but is uncomfortable so she only wears the stockings a lot of walking or standing.  She does not have it on today.  She states that the pain level is 10/10 at times. Denies any systemic complaints such as fevers, chills, nausea, vomiting. No acute changes since last appointment, and no other complaints at this time.   Objective: AAO x3, NAD DP/PT pulses palpable bilaterally, CRT less than 3 seconds There is still moderate swelling to the left foot into the ankle.  There is diffuse tenderness on the dorsal midfoot but there is no specific area of tenderness identified.  There is no erythema or increase in warmth to the left lower extremity identified today.  Ankle, subtalar joint range of motion intact. No open lesions or pre-ulcerative lesions.  No pain with calf compression, swelling, warmth, erythema  Assessment: 62 year old female with significant left midfoot arthritis; resolved erythema  Plan: -All treatment options discussed with the patient including all alternatives, risks, complications.  -Repeat x-rays were obtained and reviewed to rule out stress fracture.  Unable to identify any evidence of stress fracture today with her significant arthritic changes present. -Finished course of antibiotics although doubt infection I think the erythema is mostly swelling -Prescribed a Medrol Dosepak. -Given the ongoing pain I recommend an MRI.  She continues to have quite a bit of pain and swelling and repeat x-rays are negative.  This is to rule out a tendon tear or fracture as it is difficult to evaluate actually given significant arthritis.  This is  for potential surgical planning as well. -Continue immobilization for now -Ordered blood work today.  -Patient encouraged to call the office with any questions, concerns, change in symptoms.   Trula Slade DPM

## 2017-05-10 NOTE — Telephone Encounter (Signed)
Order to Gretta Arab, RN for pre-cert, faxed to [facility].

## 2017-05-11 ENCOUNTER — Telehealth: Payer: Self-pay | Admitting: *Deleted

## 2017-05-11 NOTE — Telephone Encounter (Signed)
I informed pt of Dr. Wagoner's review of results and orders. 

## 2017-05-11 NOTE — Telephone Encounter (Signed)
-----   Message from Trula Slade, DPM sent at 05/10/2017  5:51 PM EST ----- Please let her know that her CRP is elevated and this is a nonspecific test for inflammation. We will do the steroid and continue immobilization and await the MRI. Her WBC test is normal

## 2017-05-21 ENCOUNTER — Ambulatory Visit
Admission: RE | Admit: 2017-05-21 | Discharge: 2017-05-21 | Disposition: A | Discharge status: Home / Self Care | Payer: Medicare Other | Source: Ambulatory Visit | Attending: Podiatry | Admitting: Podiatry

## 2017-05-23 ENCOUNTER — Ambulatory Visit (INDEPENDENT_AMBULATORY_CARE_PROVIDER_SITE_OTHER): Payer: Medicare Other | Admitting: Podiatry

## 2017-05-23 DIAGNOSIS — M19072 Primary osteoarthritis, left ankle and foot: Secondary | ICD-10-CM | POA: Diagnosis not present

## 2017-05-23 MED ORDER — HYDROCODONE-ACETAMINOPHEN 5-325 MG PO TABS: 1.0000 | ORAL_TABLET | Freq: Four times a day (QID) | ORAL | 0 refills | Status: DC | PRN

## 2017-05-26 NOTE — Progress Notes (Signed)
Subjective: Rachel Luna presents today for follow-up evaluation of the foot pain.  She states that the last saw her she is been having a lot of pain to the top of her foot and she has been crying because of the pain.  She states her foot and leg will get red at times.  She states this redness is intermittent and not continuous.  She denies any recent injury to the foot.  She denies any open sores.  She has no systemic complaints such as fevers, chills, nausea, vomiting. No acute changes since last appointment, and no other complaints at this time.   Objective: AAO x3, NAD DP/PT pulses palpable bilaterally, CRT less than 3 seconds There is still diffuse tenderness to the dorsal aspect the left midfoot but there is no specific area pinpoint bony tenderness or pain to vibratory sensation.  There is mild increase in edema to the left foot compared to the contralateral extremity there is a faint amount of erythema to the dorsal foot as well as the leg but there is no increase in warmth.  There is no pain with calf compression or any other signs of a DVT in the swelling and the redness is more to the dorsal foot and anterior distal leg.  There is no ascending cellulitis otherwise.  There is no fluctuation or crepitation.  She does state that this redness is not continuous. No open lesions or pre-ulcerative lesions.  No pain with calf compression, swelling, warmth, erythema  Assessment: Left midfoot arthritis  Plan: -All treatment options discussed with the patient including all alternatives, risks, complications.  -I reviewed the MRI with her today which does reveal significant midfoot arthropathy likely an erosive arthropathy such as gout or CPPD.  Neuropathic disease is also a possibility but there is no evidence of stress fracture or AVN.  Diffuse swelling is also present.  There is no myositis.  Given the redness as well as the arthritis I am concerned about Charcot process but this is a chronic issue and is  not appear this change on x-ray. -Given the intermittent redness, arthritis I was thinking about a WBC scan. I spoke with the radiologist who read the MRI and we talked about doing an MRI with contract. We will start with that and if needed get a WBC scan or another study.  -Continuing cam boot for now. -Patient encouraged to call the office with any questions, concerns, change in symptoms.   Trula Slade DPM

## 2017-05-30 ENCOUNTER — Telehealth: Payer: Self-pay | Admitting: Neurology

## 2017-05-30 MED ORDER — BACLOFEN 10 MG PO TABS: 10.0000 mg | ORAL_TABLET | Freq: Three times a day (TID) | ORAL | 1 refills | Status: DC

## 2017-05-30 NOTE — Telephone Encounter (Signed)
Almyra Free from pharmacy has called for a refill request for baclofen (LIORESAL) 10 MG tablet

## 2017-05-30 NOTE — Addendum Note (Signed)
Addended by: Hope Pigeon on: 05/30/2017 12:23 PM   Modules accepted: Orders

## 2017-05-30 NOTE — Telephone Encounter (Addendum)
Previous rx sent on 07/20/16 qty 270 w/ 3 refills sent to [facility] which closed and is now a [facility]. Contacted [facility] at [phone] and cx any refills remaining for rx baclofen. Spoke with [employer].   E-scribed rx to [facility] as requested.

## 2017-06-02 ENCOUNTER — Telehealth: Payer: Self-pay | Admitting: *Deleted

## 2017-06-02 DIAGNOSIS — M869 Osteomyelitis, unspecified: Secondary | ICD-10-CM

## 2017-06-02 DIAGNOSIS — Z01812 Encounter for preprocedural laboratory examination: Secondary | ICD-10-CM

## 2017-06-02 NOTE — Telephone Encounter (Signed)
I informed pt of Dr. Leigh Aurora orders for the MRI with contrast for the left foot.

## 2017-06-02 NOTE — Telephone Encounter (Signed)
Orders to J. Quintana, RN for pre-cert, and faxed to [facility]. 

## 2017-06-03 ENCOUNTER — Telehealth: Payer: Self-pay | Admitting: *Deleted

## 2017-06-03 ENCOUNTER — Other Ambulatory Visit: Payer: Self-pay | Admitting: Podiatry

## 2017-06-03 MED ORDER — HYDROCODONE-ACETAMINOPHEN 5-325 MG PO TABS: 1.0000 | ORAL_TABLET | Freq: Four times a day (QID) | ORAL | 0 refills | Status: DC | PRN

## 2017-06-03 NOTE — Telephone Encounter (Signed)
I informed pt Dr. Jacqualyn Posey had refill the hydrocodone as previously, and he wanted to know if she had been seen in Pain management. Pt states she had not been to pain management and was waiting to see what the MRI showed.

## 2017-06-03 NOTE — Telephone Encounter (Signed)
You can refill the vicodin I did last appointment. See if she has seen pain management.

## 2017-06-03 NOTE — Progress Notes (Signed)
Resent Vicodin electronically I shredded the written prescription.

## 2017-06-03 NOTE — Telephone Encounter (Signed)
Pt request pain medication left foot is killing her.

## 2017-06-03 NOTE — Telephone Encounter (Addendum)
Pt states she doesn't have money to get gas to come to [facility] to get the pain medication, get him to send to the [facility] in [place]. I gave the hard copy hydrocodone rx to Dr. Jacqualyn Posey and he states he will call to the pt's pharmacy.

## 2017-06-06 ENCOUNTER — Ambulatory Visit: Payer: Medicare Other | Admitting: Podiatry

## 2017-06-13 ENCOUNTER — Other Ambulatory Visit: Payer: Medicare Other

## 2017-06-13 ENCOUNTER — Ambulatory Visit
Admission: RE | Admit: 2017-06-13 | Discharge: 2017-06-13 | Disposition: A | Discharge status: Home / Self Care | Payer: Medicare Other | Source: Ambulatory Visit | Attending: Podiatry | Admitting: Podiatry

## 2017-06-13 DIAGNOSIS — M869 Osteomyelitis, unspecified: Secondary | ICD-10-CM

## 2017-06-13 MED ORDER — GADOBENATE DIMEGLUMINE 529 MG/ML IV SOLN
20.0000 mL | Freq: Once | INTRAVENOUS | Status: AC | PRN
  Administered 2017-06-13: 20 mL via INTRAVENOUS

## 2017-06-17 ENCOUNTER — Telehealth: Payer: Self-pay | Admitting: Podiatry

## 2017-06-17 NOTE — Telephone Encounter (Signed)
I'm a pt of Dr. Leigh Aurora and I had an MRI done Monday evening. I don't have a follow up appointment with Dr. Jacqualyn Posey so I would like to know what the MRI showed and if I need a follow up appointment scheduled or what I need to do. If somebody could please call me back at [phone]. Thank you. Bye bye

## 2017-06-17 NOTE — Telephone Encounter (Signed)
I called her to discuss the MRI findings. She states she is still getting pain to the top of the foot and the other foot is starting to do the same thing. She gets occasional swelling. At this point as it has been ongoing for some time we will try a custom shoe (maybe a rocker shoe?) versus bracing. Rachel Luna- can you please schedule her with either Liliane Channel or [place] for this? Not sure if there is a pre-certification needed or anything. Thank you.

## 2017-06-21 ENCOUNTER — Telehealth: Payer: Self-pay | Admitting: Podiatry

## 2017-06-21 NOTE — Telephone Encounter (Signed)
Pt scheduled to see Liliane Channel on 4.25.19, she requested the day and time due to father has appt same day

## 2017-06-30 ENCOUNTER — Ambulatory Visit: Payer: Medicare Other | Admitting: Orthotics

## 2017-06-30 DIAGNOSIS — M19072 Primary osteoarthritis, left ankle and foot: Secondary | ICD-10-CM

## 2017-07-01 NOTE — Progress Notes (Signed)
Patient declined any thought of casting for any device.  She said, "It is summer and I walk barefoot all the time.".

## 2017-07-25 ENCOUNTER — Encounter: Payer: Self-pay | Admitting: Neurology

## 2017-07-25 ENCOUNTER — Ambulatory Visit (INDEPENDENT_AMBULATORY_CARE_PROVIDER_SITE_OTHER): Payer: Medicare Other | Admitting: Neurology

## 2017-07-25 VITALS — BP 162/90 | HR 102 | Ht 64.0 in | Wt 293.0 lb

## 2017-07-25 DIAGNOSIS — M545 Low back pain: Secondary | ICD-10-CM

## 2017-07-25 DIAGNOSIS — G2581 Restless legs syndrome: Secondary | ICD-10-CM

## 2017-07-25 DIAGNOSIS — G8929 Other chronic pain: Secondary | ICD-10-CM

## 2017-07-25 MED ORDER — CARBAMAZEPINE 200 MG PO TABS: 100.0000 mg | ORAL_TABLET | Freq: Every day | ORAL | 3 refills | Status: DC

## 2017-07-25 NOTE — Progress Notes (Signed)
Reason for visit: Chronic low back pain, restless legs  Rachel Luna is an 62 y.o. female  History of present illness:  Rachel Luna is a 62 year old right-handed white female with a history of morbid obesity, bipolar disorder, chronic low back pain, and restless leg syndrome.  The patient takes Mirapex for her restless legs with good improvement.  She takes baclofen for her back with and does well, and she is on half of a carbamazepine tablet, 200 mg daily.  The patient is overall doing fairly well, she had some weakness after a fall, MRI of the cervical spine was done and did not show spinal cord compression.  She claims that she just had blood work done, she will get the report of blood work in the near future.  Liver enzymes have been slightly elevated on the last blood work done in February 2019.  Past Medical History:  Diagnosis Date  . Arthritis   . Bipolar disorder ([place])   . chronic low back pain   . Chronic low back pain 02/18/2014  . Depression   . Fibroids   . GERD (gastroesophageal reflux disease)   . Gout   . Hyperlipidemia   . Hypertension   . Obesity     Past Surgical History:  Procedure Laterality Date  . ABDOMINAL HYSTERECTOMY    . CHOLECYSTECTOMY    . FOOT SURGERY Left   . KNEE ARTHROPLASTY    . OVARIAN CYST REMOVAL    . SPINE SURGERY      Family History  Problem Relation Age of Onset  . Diabetes Mother   . Cancer Mother        breast  . Heart disease Mother   . Mental illness Mother        bipolar disorder  . Stroke Mother   . Breast cancer Mother   . Diabetes Father   . Hypertension Father   . Diabetes Brother   . Bipolar disorder Brother   . Diabetes Brother   . Breast cancer Maternal Aunt   . Breast cancer Maternal Grandmother     Social history:  reports that she has never smoked. She has never used smokeless tobacco. She reports that she does not drink alcohol or use drugs.    Allergies  Allergen Reactions  . Wellbutrin [Bupropion]  Other (See Comments)    Irritability and Agitation  . Geodon [Ziprasidone Hcl] Other (See Comments)    Worsened Restless Leg Syndrome  . Lisinopril Cough  . Neupro [Rotigotine] Rash    Medications:  Prior to Admission medications   Medication Sig Start Date End Date Taking? Authorizing Provider  baclofen (LIORESAL) 10 MG tablet Take 1 tablet (10 mg total) by mouth 3 (three) times daily. 05/30/17  Yes Rachel Ducking, MD  carbamazepine (TEGRETOL) 200 MG tablet Take 0.5 tablets (100 mg total) by mouth 2 (two) times daily. Patient taking differently: Take 100 mg by mouth daily.  02/18/16  Yes Rachel Pines, MD  cephALEXin (KEFLEX) 500 MG capsule Take 1 capsule (500 mg total) by mouth 3 (three) times daily. 04/25/17  Yes Rachel Luna, DPM  DULoxetine (CYMBALTA) 60 MG capsule Take 1 capsule (60 mg total) by mouth daily. 02/18/16  Yes Rachel Pines, MD  estradiol (ESTRACE) 1 MG tablet take 1 tablet by mouth once daily 09/13/16  Yes Rachel Bombard, MD  gabapentin (NEURONTIN) 800 MG tablet Take 1 tablet (800 mg total) by mouth 4 (four) times daily. Patient taking differently: Take  800 mg by mouth 3 (three) times daily.  02/18/16  Yes Rachel Pines, MD  losartan-hydrochlorothiazide (HYZAAR) 50-12.5 MG tablet Take 1 tablet by mouth daily. 11/30/14  Yes [provider]  meloxicam (MOBIC) 15 MG tablet Take 15 mg daily by mouth.  07/04/15  Yes [provider]  pramipexole (MIRAPEX) 0.75 MG tablet 1 tablet in the morning, 2 in the evening 03/16/17  Yes Rachel Ducking, MD  rosuvastatin (CRESTOR) 40 MG tablet Take 1 tablet by mouth daily. 03/23/16  Yes [provider]    ROS:  Out of a complete 14 system review of symptoms, the patient complains only of the following symptoms, and all other reviewed systems are negative.  Eye itching Cough Leg swelling Heat intolerance Restless legs Joint pain Skin rash, itching Agitation, depression, anxiety  Height 5\' 4"  (1.626  m), weight 293 lb (132.9 kg).  Physical Exam  General: The patient is alert and cooperative at the time of the examination.  The patient is morbidly obese.  Skin: No significant peripheral edema is noted.   Neurologic Exam  Mental status: The patient is alert and oriented x 3 at the time of the examination. The patient has apparent normal recent and remote memory, with an apparently normal attention span and concentration ability.   Cranial nerves: Facial symmetry is present. Speech is normal, no aphasia or dysarthria is noted. Extraocular movements are full. Visual fields are full.  Motor: The patient has good strength in all 4 extremities.  Sensory examination: Soft touch sensation is symmetric on the face, arms, and legs.  Coordination: The patient has good finger-nose-finger and heel-to-shin bilaterally.  Gait and station: The patient has a normal gait. Tandem gait is normal. Romberg is negative. No drift is seen.  Reflexes: Deep tendon reflexes are symmetric.   MRI cervical 04/25/17:  IMPRESSION: This MRI of the cervical spine shows the following: 1. At C5-C6, there is mild spinal stenosis and mild bilateral foraminal narrowing due to broad disc protrusion, mild uncovertebral spurring and facet hypertrophy. There is no nerve root compression. 2. At C6-C7, there is borderline spinal stenosis due to disc bulging and mild uncovertebral spurring. There is no significant foraminal narrowing and no nerve root compression. 3. The spinal cord appears normal.  * MRI scan images were reviewed online. I agree with the written report.    Assessment/Plan:  1.  Chronic low back pain  2.  Morbid obesity  3.  Restless leg syndrome  The patient overall is doing fairly well at this point.  The patient will be given a prescription for carbamazepine, she will continue the baclofen and the Mirapex.  She will follow-up in 6 months.  Rachel Alexanders MD 07/25/2017 2:32  PM  [facility] [address] [place], [place] [address]  Phone [phone] Fax [phone]

## 2017-07-29 ENCOUNTER — Emergency Department: Payer: Medicare Other

## 2017-07-29 ENCOUNTER — Emergency Department
Admission: EM | Admit: 2017-07-29 | Discharge: 2017-07-29 | Disposition: A | Discharge status: Home / Self Care | Payer: Medicare Other | Attending: Emergency Medicine | Admitting: Emergency Medicine

## 2017-07-29 ENCOUNTER — Other Ambulatory Visit: Payer: Self-pay

## 2017-07-29 ENCOUNTER — Encounter: Payer: Self-pay | Admitting: Emergency Medicine

## 2017-07-29 DIAGNOSIS — Z96652 Presence of left artificial knee joint: Secondary | ICD-10-CM | POA: Diagnosis not present

## 2017-07-29 DIAGNOSIS — L03115 Cellulitis of right lower limb: Secondary | ICD-10-CM | POA: Diagnosis not present

## 2017-07-29 DIAGNOSIS — L0390 Cellulitis, unspecified: Secondary | ICD-10-CM

## 2017-07-29 DIAGNOSIS — R609 Edema, unspecified: Secondary | ICD-10-CM

## 2017-07-29 DIAGNOSIS — I10 Essential (primary) hypertension: Secondary | ICD-10-CM | POA: Diagnosis not present

## 2017-07-29 DIAGNOSIS — E119 Type 2 diabetes mellitus without complications: Secondary | ICD-10-CM | POA: Insufficient documentation

## 2017-07-29 DIAGNOSIS — R600 Localized edema: Secondary | ICD-10-CM | POA: Diagnosis present

## 2017-07-29 DIAGNOSIS — L03116 Cellulitis of left lower limb: Secondary | ICD-10-CM | POA: Insufficient documentation

## 2017-07-29 HISTORY — DX: Type 2 diabetes mellitus without complications: E11.9

## 2017-07-29 LAB — CBC WITH DIFFERENTIAL/PLATELET
BASOS PCT: 1 %
Basophils Absolute: 0.1 10*3/uL (ref 0–0.1)
EOS ABS: 0.3 10*3/uL (ref 0–0.7)
EOS PCT: 4 %
HCT: 38.4 % (ref 35.0–47.0)
HEMOGLOBIN: 12.9 g/dL (ref 12.0–16.0)
Lymphocytes Relative: 27 %
Lymphs Abs: 1.8 10*3/uL (ref 1.0–3.6)
MCH: 29.4 pg (ref 26.0–34.0)
MCHC: 33.7 g/dL (ref 32.0–36.0)
MCV: 87.2 fL (ref 80.0–100.0)
MONOS PCT: 8 %
Monocytes Absolute: 0.6 10*3/uL (ref 0.2–0.9)
Neutro Abs: 4 10*3/uL (ref 1.4–6.5)
Neutrophils Relative %: 60 %
PLATELETS: 228 10*3/uL (ref 150–440)
RBC: 4.4 MIL/uL (ref 3.80–5.20)
RDW: 14.7 % — ABNORMAL HIGH (ref 11.5–14.5)
WBC: 6.7 10*3/uL (ref 3.6–11.0)

## 2017-07-29 LAB — COMPREHENSIVE METABOLIC PANEL
ALBUMIN: 3.7 g/dL (ref 3.5–5.0)
ALT: 22 U/L (ref 14–54)
ANION GAP: 7 (ref 5–15)
AST: 35 U/L (ref 15–41)
Alkaline Phosphatase: 82 U/L (ref 38–126)
BUN: 14 mg/dL (ref 6–20)
CHLORIDE: 101 mmol/L (ref 101–111)
CO2: 28 mmol/L (ref 22–32)
Calcium: 8.7 mg/dL — ABNORMAL LOW (ref 8.9–10.3)
Creatinine, Ser: 0.66 mg/dL (ref 0.44–1.00)
GFR calc Af Amer: >60 mL/min (ref >60)
GFR calc non Af Amer: >60 mL/min (ref >60)
GLUCOSE: 170 mg/dL — AB (ref 65–99)
POTASSIUM: 3.9 mmol/L (ref 3.5–5.1)
SODIUM: 136 mmol/L (ref 135–145)
Total Bilirubin: 0.6 mg/dL (ref 0.3–1.2)
Total Protein: 6.5 g/dL (ref 6.5–8.1)

## 2017-07-29 MED ORDER — VANCOMYCIN HCL IN DEXTROSE 1-5 GM/200ML-% IV SOLN
1000.0000 mg | Freq: Once | INTRAVENOUS | Status: AC
  Administered 2017-07-29: 1000 mg via INTRAVENOUS
  Filled 2017-07-29: qty 200

## 2017-07-29 MED ORDER — FUROSEMIDE 20 MG PO TABS: 20.0000 mg | ORAL_TABLET | Freq: Every day | ORAL | 0 refills | Status: DC

## 2017-07-29 MED ORDER — PREDNISONE 10 MG (21) PO TBPK: ORAL_TABLET | Freq: Every day | ORAL | 0 refills | Status: DC

## 2017-07-29 MED ORDER — METHYLPREDNISOLONE SODIUM SUCC 125 MG IJ SOLR
125.0000 mg | Freq: Once | INTRAMUSCULAR | Status: AC
  Administered 2017-07-29: 125 mg via INTRAVENOUS
  Filled 2017-07-29 (×2): qty 2

## 2017-07-29 MED ORDER — ACETAMINOPHEN 500 MG PO TABS
1000.0000 mg | ORAL_TABLET | Freq: Once | ORAL | Status: AC
  Administered 2017-07-29: 1000 mg via ORAL
  Filled 2017-07-29: qty 2

## 2017-07-29 MED ORDER — SULFAMETHOXAZOLE-TRIMETHOPRIM 800-160 MG PO TABS: 1.0000 | ORAL_TABLET | Freq: Two times a day (BID) | ORAL | 0 refills | Status: DC

## 2017-07-29 NOTE — ED Notes (Signed)
First Nurse Note:  Patient complaining of legs itching.  States she saw her PCP earlier this week and was placed on Keflex and Bendryl.

## 2017-07-29 NOTE — ED Provider Notes (Signed)
[facility] Emergency Department Provider Note       Time seen: ----------------------------------------- 7:31 AM on 07/29/2017 -----------------------------------------   I have reviewed the triage vital signs and the nursing notes.  HISTORY   Chief Complaint Cellulitis    HPI Rachel Luna is a 62 y.o. female with a history of Reiter's, bipolar disorder, chronic low back pain, depression, diabetes, fibroids, GERD, hyperlipidemia who presents to the ED for leg swelling and itching.  Patient states she was seen by her primary care doctor on Wednesday diagnosis cellulitis of the right leg.  She was given antibiotics but states her symptoms not improving.  She is having swelling and redness now on the left leg as well around her ankle.  She complains of severe leg itching bilaterally.  She was placed on Keflex and Benadryl by her doctor with only marginal improvement.  Past Medical History:  Diagnosis Date  . Arthritis   . Bipolar disorder ([place])   . chronic low back pain   . Chronic low back pain 02/18/2014  . Depression   . Diabetes mellitus without complication ([place])   . Fibroids   . GERD (gastroesophageal reflux disease)   . Gout   . Hyperlipidemia   . Hypertension   . Obesity     Patient Active Problem List   Diagnosis Date Noted  . Ingrown toenail 04/15/2016  . Arthritis of foot, degenerative 04/10/2015  . 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 ANA positive 03/05/2015  . Foot pain 03/05/2015  . Elevated rheumatoid factor 03/05/2015  . BP (high blood pressure) 09/04/2014  . Avitaminosis D 09/04/2014  . Dizzy 07/31/2014  . Chronic low back pain 02/18/2014  . Pain in joint, lower leg 08/10/2013  . Acid reflux 03/12/2013  . Restless legs syndrome (RLS) 02/16/2013  . Disturbance of skin sensation 02/16/2013  . Menopausal hot flushes 08/14/2012  . Arthritis of knee, degenerative 04/06/2011  . Restless leg 11/11/2010  . Benign  essential HTN 07/13/2010  . Elevated fasting blood sugar 07/13/2010  . Cannot sleep 07/13/2010  . Chronic pain 01/08/2010  . Affective disorder, major 11/05/2009  . Major depressive disorder 11/05/2009  . Allergic rhinitis 07/03/2009    Past Surgical History:  Procedure Laterality Date  . ABDOMINAL HYSTERECTOMY    . CHOLECYSTECTOMY    . FOOT SURGERY Left   . KNEE ARTHROPLASTY    . OVARIAN CYST REMOVAL    . SPINE SURGERY      Allergies Wellbutrin [bupropion]; Geodon [ziprasidone hcl]; Lisinopril; and Neupro [rotigotine]  Social History Social History   Tobacco Use  . Smoking status: Never Smoker  . Smokeless tobacco: Never Used  Substance Use Topics  . Alcohol use: No    Alcohol/week: 0.0 oz  . Drug use: No   Review of Systems Constitutional: Negative for fever. Cardiovascular: Negative for chest pain. Respiratory: Negative for shortness of breath. Gastrointestinal: Negative for abdominal pain, vomiting and diarrhea. Musculoskeletal: Positive for leg erythema Skin: Positive for rash and itching Neurological: Negative for headaches, focal weakness or numbness.  All systems negative/normal/unremarkable except as stated in the HPI  ____________________________________________   PHYSICAL EXAM:  VITAL SIGNS: ED Triage Vitals [07/29/17 0722]  Enc Vitals Group     BP (!) 161/75     Pulse Rate 94     Resp 16     Temp 98 F (36.7 C)     Temp Source Oral     SpO2 95 %     Weight  293 lb (132.9 kg)     Height 5\' 4"  (1.626 m)     Head Circumference      Peak Flow      Pain Score 9     Pain Loc      Pain Edu?      Excl. in [place]?    Constitutional: Alert and oriented. Well appearing and in no distress. Cardiovascular: Normal rate, regular rhythm. No murmurs, rubs, or gallops. Respiratory: Normal respiratory effort without tachypnea nor retractions. Breath sounds are clear and equal bilaterally. No wheezes/rales/rhonchi. Gastrointestinal: Soft and nontender. Normal  bowel sounds Musculoskeletal: Nonfocal tenderness, bilateral lower extremity edema is noted Neurologic:  Normal speech and language. No gross focal neurologic deficits are appreciated.  Skin: There is skin erythema on the right lower leg anteriorly, maculopapular type rash is present.  There is also scattered leg erythema on the right leg and around the ankle posteriorly of the left leg. Psychiatric: Mood and affect are normal. Speech and behavior are normal.  ____________________________________________  ED COURSE:  As part of my medical decision making, I reviewed the following data within the [facility] History obtained from family if available, nursing notes, old chart and ekg, as well as notes from prior ED visits. Patient presented for leg itching and redness, we will assess with labs and imaging as indicated at this time.   Procedures ____________________________________________   LABS (pertinent positives/negatives)  Labs Reviewed  CBC WITH DIFFERENTIAL/PLATELET - Abnormal; Notable for the following components:      Result Value   RDW 14.7 (*)    All other components within normal limits  COMPREHENSIVE METABOLIC PANEL - Abnormal; Notable for the following components:   Glucose, Bld 170 (*)    Calcium 8.7 (*)    All other components within normal limits    RADIOLOGY US venous bilaterally IMPRESSION: No evidence of deep venous thrombosis. ____________________________________________  DIFFERENTIAL DIAGNOSIS   Cellulitis, dermatitis, allergic reaction, DVT  FINAL ASSESSMENT AND PLAN  Cellulitis, peripheral edema   Plan: The patient had presented for left leg redness and itching. Patient's labs were reassuring. Patient's imaging not reveal any DVT.  I will change her antibiotic from Keflex to Septra DS.  I will also place her on a short course of Lasix and steroids.  I think these symptoms she is having is multifactorial.  She is cleared for outpatient  follow-up and has received IV vancomycin here.   Laurence Aly, MD   Note: This note was generated in part or whole with voice recognition software. Voice recognition is usually quite accurate but there are transcription errors that can and very often do occur. I apologize for any typographical errors that were not detected and corrected.     Earleen Newport, MD 07/29/17 [phone]

## 2017-07-29 NOTE — ED Triage Notes (Signed)
Pt to ED via POV, pt states that she was seen by her PCP on Wednesday and diagnosed with cellulitis on right leg. Pt was given antibiotics. Pt states that the swelling and redness is now on the left leg as well. Pt states that the her legs are itching. Pt is in NAD at this time.

## 2017-08-31 ENCOUNTER — Other Ambulatory Visit: Payer: Self-pay | Admitting: Neurology

## 2017-09-10 ENCOUNTER — Other Ambulatory Visit: Payer: Self-pay

## 2017-09-10 ENCOUNTER — Emergency Department: Payer: Medicare Other

## 2017-09-10 ENCOUNTER — Encounter: Payer: Self-pay | Admitting: Emergency Medicine

## 2017-09-10 ENCOUNTER — Emergency Department
Admission: EM | Admit: 2017-09-10 | Discharge: 2017-09-10 | Disposition: A | Discharge status: Home / Self Care | Payer: Medicare Other | Attending: Emergency Medicine | Admitting: Emergency Medicine

## 2017-09-10 DIAGNOSIS — Z79899 Other long term (current) drug therapy: Secondary | ICD-10-CM | POA: Diagnosis not present

## 2017-09-10 DIAGNOSIS — E119 Type 2 diabetes mellitus without complications: Secondary | ICD-10-CM | POA: Insufficient documentation

## 2017-09-10 DIAGNOSIS — E876 Hypokalemia: Secondary | ICD-10-CM | POA: Diagnosis not present

## 2017-09-10 DIAGNOSIS — R260 Ataxic gait: Secondary | ICD-10-CM | POA: Insufficient documentation

## 2017-09-10 DIAGNOSIS — B354 Tinea corporis: Secondary | ICD-10-CM | POA: Diagnosis not present

## 2017-09-10 DIAGNOSIS — Z7984 Long term (current) use of oral hypoglycemic drugs: Secondary | ICD-10-CM | POA: Insufficient documentation

## 2017-09-10 DIAGNOSIS — N390 Urinary tract infection, site not specified: Secondary | ICD-10-CM | POA: Insufficient documentation

## 2017-09-10 DIAGNOSIS — I10 Essential (primary) hypertension: Secondary | ICD-10-CM | POA: Insufficient documentation

## 2017-09-10 DIAGNOSIS — M79604 Pain in right leg: Secondary | ICD-10-CM | POA: Diagnosis present

## 2017-09-10 LAB — COMPREHENSIVE METABOLIC PANEL
ALT: 28 U/L (ref 0–44)
ANION GAP: 10 (ref 5–15)
AST: 40 U/L (ref 15–41)
Albumin: 3.6 g/dL (ref 3.5–5.0)
Alkaline Phosphatase: 78 U/L (ref 38–126)
BUN: 44 mg/dL — AB (ref 6–20)
CHLORIDE: 98 mmol/L (ref 98–111)
CO2: 27 mmol/L (ref 22–32)
Calcium: 9.1 mg/dL (ref 8.9–10.3)
Creatinine, Ser: 1.36 mg/dL — ABNORMAL HIGH (ref 0.44–1.00)
GFR calc Af Amer: 48 mL/min — ABNORMAL LOW (ref >60)
GFR calc non Af Amer: 42 mL/min — ABNORMAL LOW (ref >60)
Glucose, Bld: 198 mg/dL — ABNORMAL HIGH (ref 70–99)
POTASSIUM: 2.7 mmol/L — AB (ref 3.5–5.1)
Sodium: 135 mmol/L (ref 135–145)
TOTAL PROTEIN: 6.8 g/dL (ref 6.5–8.1)
Total Bilirubin: 0.6 mg/dL (ref 0.3–1.2)

## 2017-09-10 LAB — CBC WITH DIFFERENTIAL/PLATELET
Basophils Absolute: 0.1 10*3/uL (ref 0–0.1)
Basophils Relative: 1 %
Eosinophils Absolute: 0.2 10*3/uL (ref 0–0.7)
Eosinophils Relative: 3 %
HEMATOCRIT: 36.1 % (ref 35.0–47.0)
Hemoglobin: 12.4 g/dL (ref 12.0–16.0)
LYMPHS ABS: 1.8 10*3/uL (ref 1.0–3.6)
LYMPHS PCT: 22 %
MCH: 30 pg (ref 26.0–34.0)
MCHC: 34.4 g/dL (ref 32.0–36.0)
MCV: 87.1 fL (ref 80.0–100.0)
MONO ABS: 0.8 10*3/uL (ref 0.2–0.9)
MONOS PCT: 10 %
NEUTROS ABS: 5.2 10*3/uL (ref 1.4–6.5)
Neutrophils Relative %: 64 %
Platelets: 206 10*3/uL (ref 150–440)
RBC: 4.15 MIL/uL (ref 3.80–5.20)
RDW: 14.9 % — AB (ref 11.5–14.5)
WBC: 8.1 10*3/uL (ref 3.6–11.0)

## 2017-09-10 LAB — TROPONIN I: Troponin I: <0.03 ng/mL (ref <0.03)

## 2017-09-10 LAB — URINALYSIS, COMPLETE (UACMP) WITH MICROSCOPIC
BILIRUBIN URINE: NEGATIVE
Bacteria, UA: NONE SEEN
Glucose, UA: NEGATIVE mg/dL
KETONES UR: NEGATIVE mg/dL
Leukocytes, UA: NEGATIVE
Nitrite: POSITIVE — AB
Protein, ur: NEGATIVE mg/dL
Specific Gravity, Urine: 1.039 — ABNORMAL HIGH (ref 1.005–1.030)
pH: 5 (ref 5.0–8.0)

## 2017-09-10 LAB — CARBAMAZEPINE LEVEL, TOTAL: Carbamazepine Lvl: 2.3 ug/mL — ABNORMAL LOW (ref 4.0–12.0)

## 2017-09-10 LAB — MAGNESIUM: Magnesium: 2.1 mg/dL (ref 1.7–2.4)

## 2017-09-10 MED ORDER — IOHEXOL 350 MG/ML SOLN
60.0000 mL | Freq: Once | INTRAVENOUS | Status: AC | PRN
  Administered 2017-09-10: 60 mL via INTRAVENOUS

## 2017-09-10 MED ORDER — CEPHALEXIN 500 MG PO CAPS: 500.0000 mg | ORAL_CAPSULE | Freq: Three times a day (TID) | ORAL | 0 refills | Status: AC

## 2017-09-10 MED ORDER — METHYLPREDNISOLONE SODIUM SUCC 125 MG IJ SOLR: 125.0000 mg | Freq: Once | INTRAMUSCULAR | Status: DC

## 2017-09-10 MED ORDER — POTASSIUM CHLORIDE CRYS ER 20 MEQ PO TBCR
40.0000 meq | EXTENDED_RELEASE_TABLET | Freq: Once | ORAL | Status: AC
  Administered 2017-09-10: 40 meq via ORAL
  Filled 2017-09-10 (×2): qty 2

## 2017-09-10 MED ORDER — CLOTRIMAZOLE 1 % EX CREA: 1.0000 "application " | TOPICAL_CREAM | Freq: Two times a day (BID) | CUTANEOUS | 1 refills | Status: DC

## 2017-09-10 MED ORDER — POTASSIUM CHLORIDE ER 10 MEQ PO TBCR: 40.0000 meq | EXTENDED_RELEASE_TABLET | Freq: Two times a day (BID) | ORAL | 0 refills | Status: DC

## 2017-09-10 MED ORDER — SODIUM CHLORIDE 0.9 % IV BOLUS
1000.0000 mL | Freq: Once | INTRAVENOUS | Status: AC
  Administered 2017-09-10: 1000 mL via INTRAVENOUS

## 2017-09-10 MED ORDER — CEPHALEXIN 500 MG PO CAPS
500.0000 mg | ORAL_CAPSULE | Freq: Once | ORAL | Status: AC
  Administered 2017-09-10: 500 mg via ORAL
  Filled 2017-09-10: qty 1

## 2017-09-10 NOTE — ED Provider Notes (Addendum)
[facility] Emergency Department Provider Note ____________________________________________   First MD Initiated Contact with Patient 09/10/17 0945     (approximate)  I have reviewed the triage vital signs and the nursing notes.   HISTORY  Chief Complaint Leg Pain   HPI Rachel Luna is a 62 y.o. female with a history of bipolar disorder as well as diabetes and hyperlipidemia who is presenting to the emergency department today with a rash to her right lower extremity as well as ataxia.  She says that over the course of the past month she has been treated with 2 courses of antibiotics including Keflex as well as Bactrim, course of steroids for the right lower extremity.  She says that the area of erythema has been bigger in the past but now it is more painful and itchy.  She denies any exposure to poison ivy or any other outdoor plants that could have brushed against the leg.  Says that she also has a rash to her right proximal forearm and the right chest wall as well.  Also related ataxia and says that she has been stumbling around like she is drunk since this past Thursday.  Says that she has not had any drinks for years and needed assistance from the car to walk back to the examination room.  Past Medical History:  Diagnosis Date  . Arthritis   . Bipolar disorder ([place])   . chronic low back pain   . Chronic low back pain 02/18/2014  . Depression   . Diabetes mellitus without complication ([place])   . Fibroids   . GERD (gastroesophageal reflux disease)   . Gout   . Hyperlipidemia   . Hypertension   . Obesity     Patient Active Problem List   Diagnosis Date Noted  . Ingrown toenail 04/15/2016  . Arthritis of foot, degenerative 04/10/2015  . 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 ANA positive 03/05/2015  . Foot pain 03/05/2015  . Elevated rheumatoid factor 03/05/2015  . BP (high blood pressure) 09/04/2014  . Avitaminosis D 09/04/2014  . Dizzy  07/31/2014  . Chronic low back pain 02/18/2014  . Pain in joint, lower leg 08/10/2013  . Acid reflux 03/12/2013  . Restless legs syndrome (RLS) 02/16/2013  . Disturbance of skin sensation 02/16/2013  . Menopausal hot flushes 08/14/2012  . Arthritis of knee, degenerative 04/06/2011  . Restless leg 11/11/2010  . Benign essential HTN 07/13/2010  . Elevated fasting blood sugar 07/13/2010  . Cannot sleep 07/13/2010  . Chronic pain 01/08/2010  . Affective disorder, major 11/05/2009  . Major depressive disorder 11/05/2009  . Allergic rhinitis 07/03/2009    Past Surgical History:  Procedure Laterality Date  . ABDOMINAL HYSTERECTOMY    . CHOLECYSTECTOMY    . FOOT SURGERY Left   . KNEE ARTHROPLASTY    . OVARIAN CYST REMOVAL    . SPINE SURGERY      Prior to Admission medications   Medication Sig Start Date End Date Taking? Authorizing Provider  baclofen (LIORESAL) 10 MG tablet Take 1 tablet (10 mg total) by mouth 3 (three) times daily. 05/30/17   Kathrynn Ducking, MD  carbamazepine (TEGRETOL) 200 MG tablet Take 0.5 tablets (100 mg total) by mouth daily. 07/25/17   Kathrynn Ducking, MD  celecoxib (CELEBREX) 100 MG capsule Take 1 capsule by mouth 2 (two) times daily. 07/06/17   [provider]  cephALEXin (KEFLEX) 500 MG capsule Take 1 capsule (500 mg total) by  mouth 3 (three) times daily. Patient taking differently: Take 500 mg by mouth 2 (two) times daily.  04/25/17   Trula Slade, DPM  cetirizine (ZYRTEC) 10 MG tablet Take 10 mg by mouth daily.    [provider]  clobetasol ointment (TEMOVATE) 1.54 % Apply 1 application topically as directed. 07/23/17   [provider]  DULoxetine (CYMBALTA) 60 MG capsule Take 1 capsule (60 mg total) by mouth daily. 02/18/16   Rainey Pines, MD  estradiol (ESTRACE) 1 MG tablet take 1 tablet by mouth once daily 09/13/16   Shelly Bombard, MD  furosemide (LASIX) 20 MG tablet Take 1 tablet (20 mg total) by mouth daily for 5  days. 07/29/17 08/03/17  Earleen Newport, MD  gabapentin (NEURONTIN) 800 MG tablet Take 1 tablet (800 mg total) by mouth 4 (four) times daily. Patient taking differently: Take 1 tablet (800MG ) by mouth every morning and daily around lunchtime then take 2 tablets (1600MG ) by mouth at bedtime 02/18/16   Rainey Pines, MD  losartan-hydrochlorothiazide (HYZAAR) 50-12.5 MG tablet Take 1 tablet by mouth daily. 11/30/14   [provider]  meloxicam (MOBIC) 15 MG tablet Take 15 mg daily by mouth.  07/04/15   [provider]  metFORMIN (GLUCOPHAGE) 1000 MG tablet Take 1 tablet by mouth 2 (two) times daily. 07/27/17   [provider]  pramipexole (MIRAPEX) 0.75 MG tablet 1 tablet in the morning, 2 in the evening 03/16/17   Kathrynn Ducking, MD  predniSONE (STERAPRED UNI-PAK 21 TAB) 10 MG (21) TBPK tablet Take by mouth daily. Dispense steroid taper pack as directed 07/29/17   Earleen Newport, MD  rosuvastatin (CRESTOR) 40 MG tablet Take 1 tablet by mouth daily. 03/23/16   [provider]  sulfamethoxazole-trimethoprim (BACTRIM DS) 800-160 MG tablet Take 1 tablet by mouth 2 (two) times daily. 07/29/17   Earleen Newport, MD    Allergies Wellbutrin [bupropion]; Geodon [ziprasidone hcl]; Lisinopril; and Neupro [rotigotine]  Family History  Problem Relation Age of Onset  . Diabetes Mother   . Cancer Mother        breast  . Heart disease Mother   . Mental illness Mother        bipolar disorder  . Stroke Mother   . Breast cancer Mother   . Diabetes Father   . Hypertension Father   . Diabetes Brother   . Bipolar disorder Brother   . Diabetes Brother   . Breast cancer Maternal Aunt   . Breast cancer Maternal Grandmother     Social History Social History   Tobacco Use  . Smoking status: Never Smoker  . Smokeless tobacco: Never Used  Substance Use Topics  . Alcohol use: No    Alcohol/week: 0.0 oz  . Drug use: No    Review of Systems  Constitutional:  No fever/chills Eyes: No visual changes. ENT: No sore throat. Cardiovascular: Denies chest pain. Respiratory: Denies shortness of breath. Gastrointestinal: No abdominal pain.  No nausea, no vomiting.  No diarrhea.  No constipation. Genitourinary: Negative for dysuria. Musculoskeletal: Negative for back pain. Skin: As above Neurological: Negative for headaches, focal weakness or numbness.   ____________________________________________   PHYSICAL EXAM:  VITAL SIGNS: ED Triage Vitals  Enc Vitals Group     BP 09/10/17 0937 (!) 145/81     Pulse Rate 09/10/17 0933 (!) 191     Resp 09/10/17 0933 20     Temp 09/10/17 0933 98 F (36.7 C)  Temp Source 09/10/17 0933 Oral     SpO2 09/10/17 0933 95 %     Weight 09/10/17 0935 280 lb (127 kg)     Height 09/10/17 0935 5\' 4"  (1.626 m)     Head Circumference --      Peak Flow --      Pain Score 09/10/17 0935 10     Pain Loc --      Pain Edu? --      Excl. in [place]? --     Constitutional: Alert and oriented. Well appearing and in no acute distress. Eyes: Conjunctivae are normal.  Head: Atraumatic. Nose: No congestion/rhinnorhea. Mouth/Throat: Mucous membranes are moist.  Neck: No stridor.  Normal TMs, bilaterally. Cardiovascular: Normal rate, regular rhythm. Grossly normal heart sounds.  Good peripheral circulation with equal and bilateral dorsalis pedis pulses. Respiratory: Normal respiratory effort.  No retractions. Lungs CTAB. Gastrointestinal: Soft and nontender. No distention. No CVA tenderness. Musculoskeletal: No lower extremity edema.  No joint effusions.  Raise, erythematous rectangular shaped patch on the right anterior leg which is approximately 20 cm x 10 cm.  There are excoriations overlying it.  No bullae or vesicles.  No active bleeding.  No exudate.  No scaling.  However, the patient does state that there was exfoliation of skin after she was placed on steroids and antibiotics.  Quarter sized patch of similarly  appearing skin to the right proximal arm, immediately just distal to the axilla.  Also with an additional quarter sized patch just lateral to the right breast.  No lesions on the palms and soles.  No intraoral lesions.  Neurologic:  Normal speech and language. No gross focal neurologic deficits are appreciated.  No nystagmus.  No ataxia on finger-to-nose testing. Skin:  Skin is warm, dry and intact. No rash noted. Psychiatric: Mood and affect are normal. Speech and behavior are normal.  ____________________________________________   LABS (all labs ordered are listed, but only abnormal results are displayed)  Labs Reviewed  COMPREHENSIVE METABOLIC PANEL - Abnormal; Notable for the following components:      Result Value   Potassium 2.7 (*)    Glucose, Bld 198 (*)    BUN 44 (*)    Creatinine, Ser 1.36 (*)    GFR calc non Af Amer 42 (*)    GFR calc Af Amer 48 (*)    All other components within normal limits  CBC WITH DIFFERENTIAL/PLATELET - Abnormal; Notable for the following components:   RDW 14.9 (*)    All other components within normal limits  CARBAMAZEPINE LEVEL, TOTAL - Abnormal; Notable for the following components:   Carbamazepine Lvl 2.3 (*)    All other components within normal limits  URINALYSIS, COMPLETE (UACMP) WITH MICROSCOPIC - Abnormal; Notable for the following components:   Color, Urine YELLOW (*)    APPearance CLEAR (*)    Specific Gravity, Urine 1.039 (*)    Hgb urine dipstick MODERATE (*)    Nitrite POSITIVE (*)    All other components within normal limits  URINE CULTURE  TROPONIN I  MAGNESIUM   ____________________________________________  EKG  ED ECG REPORT I, Doran Stabler, the attending physician, personally viewed and interpreted this ECG.   Date: 09/10/2017  EKG Time: 1017  Rate: 75  Rhythm: normal sinus rhythm  Axis: Normal  Intervals:none  ST&T Change: No ST segment elevation or depression.  No abnormal T wave  inversion.  ____________________________________________  RADIOLOGY  No acute finding on the CT angiography of the head  neck. ____________________________________________   PROCEDURES  Procedure(s) performed:   Procedures  Critical Care performed:   ____________________________________________   INITIAL IMPRESSION / ASSESSMENT AND PLAN / ED COURSE  Pertinent labs & imaging results that were available during my care of the patient were reviewed by me and considered in my medical decision making (see chart for details).  DDX: Cellulitis, fungal infection, zoster (however, lesions do not appear dermatomal close), peripheral vertigo, ataxia, vertebrobasilar insufficiency, CVA As part of my medical decision making, I reviewed the following data within the [facility] Notes from prior ED visits    Patient previously ruled out for blood clots on last visit to the emergency department in late May.  ----------------------------------------- 12:45 PM on 09/10/2017 -----------------------------------------  Patient asleep when I entered the room but is easily awoken.  Found to have hypokalemia, UTI as well as a likely fungal infection of the skin.  Patient will be given a prescription for clotrimazole, potassium as well as Keflex.  She is understanding of the diagnosis as well as the treatment plan willing to comply.  She will be following up with her primary care doctor scheduled appointment this coming Wednesday.  I will also give her the follow-up information for dermatology.  She is understanding of the diagnosis as well as the treatment plan willing to comply.  Patient is already been through a course of 2 antibiotics and steroids without resolution.  I believe that tinea is likely the most likely agent at this time especially with itching seeming to be the most predominant symptom.   ____________________________________________   FINAL CLINICAL IMPRESSION(S) / ED  DIAGNOSES  Hypokalemia.  UTI.  Tinea corporis.    NEW MEDICATIONS STARTED DURING THIS VISIT:  New Prescriptions   No medications on file     Note:  This document was prepared using Dragon voice recognition software and may include unintentional dictation errors.     Tressa Maldonado, Randall An, MD 09/10/17 1248  Patient noted to have prerenal pattern on her BMP.  Given IV fluids.    Orbie Pyo, MD 09/10/17 1249

## 2017-09-10 NOTE — ED Notes (Signed)
Returned from CT.

## 2017-09-10 NOTE — ED Triage Notes (Signed)
L leg pain and redness x 1 month. States has been being treated for cellulitis.

## 2017-09-13 LAB — URINE CULTURE: Culture: >=100000 — AB

## 2017-09-16 ENCOUNTER — Other Ambulatory Visit: Payer: Self-pay | Admitting: Nurse Practitioner

## 2017-09-16 DIAGNOSIS — Z1231 Encounter for screening mammogram for malignant neoplasm of breast: Secondary | ICD-10-CM

## 2017-09-30 ENCOUNTER — Other Ambulatory Visit: Payer: Self-pay | Admitting: Obstetrics

## 2017-09-30 ENCOUNTER — Other Ambulatory Visit: Payer: Self-pay | Admitting: Neurology

## 2017-10-10 ENCOUNTER — Other Ambulatory Visit: Payer: Self-pay | Admitting: Obstetrics

## 2017-10-10 DIAGNOSIS — Z780 Asymptomatic menopausal state: Secondary | ICD-10-CM

## 2017-10-10 MED ORDER — ESTRADIOL 0.5 MG PO TABS: 0.5000 mg | ORAL_TABLET | Freq: Every day | ORAL | 11 refills | Status: DC

## 2017-10-10 NOTE — Telephone Encounter (Signed)
Please review for refill.  

## 2017-10-10 NOTE — Telephone Encounter (Signed)
ESTRACE Rx at a decreased dose from 0.1 to 0.5 mg daily.

## 2017-10-11 NOTE — Telephone Encounter (Signed)
Pt informed of decrease. Rx has been filled

## 2017-10-19 ENCOUNTER — Encounter: Payer: Self-pay | Admitting: Psychiatry

## 2017-10-19 ENCOUNTER — Ambulatory Visit (INDEPENDENT_AMBULATORY_CARE_PROVIDER_SITE_OTHER): Payer: Medicare Other | Admitting: Psychiatry

## 2017-10-19 ENCOUNTER — Other Ambulatory Visit: Payer: Self-pay

## 2017-10-19 ENCOUNTER — Encounter (INDEPENDENT_AMBULATORY_CARE_PROVIDER_SITE_OTHER): Payer: Self-pay

## 2017-10-19 VITALS — BP 153/81 | HR 105 | Temp 97.7°F | Wt 291.8 lb

## 2017-10-19 DIAGNOSIS — G40909 Epilepsy, unspecified, not intractable, without status epilepticus: Secondary | ICD-10-CM | POA: Insufficient documentation

## 2017-10-19 DIAGNOSIS — F5105 Insomnia due to other mental disorder: Secondary | ICD-10-CM | POA: Diagnosis not present

## 2017-10-19 DIAGNOSIS — F3160 Bipolar disorder, current episode mixed, unspecified: Secondary | ICD-10-CM | POA: Diagnosis not present

## 2017-10-19 DIAGNOSIS — F1321 Sedative, hypnotic or anxiolytic dependence, in remission: Secondary | ICD-10-CM

## 2017-10-19 DIAGNOSIS — F411 Generalized anxiety disorder: Secondary | ICD-10-CM

## 2017-10-19 MED ORDER — HYDROXYZINE PAMOATE 25 MG PO CAPS: 25.0000 mg | ORAL_CAPSULE | Freq: Every day | ORAL | 1 refills | Status: DC | PRN

## 2017-10-19 MED ORDER — TRAZODONE HCL 50 MG PO TABS: 50.0000 mg | ORAL_TABLET | Freq: Every day | ORAL | 1 refills | Status: DC

## 2017-10-19 MED ORDER — CARIPRAZINE HCL 1.5 MG PO CAPS
1.5000 mg | ORAL_CAPSULE | Freq: Every day | ORAL | 0 refills | Status: DC
Start: 2017-10-19 — End: 2017-11-16
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
## 2017-10-19 NOTE — Patient Instructions (Signed)
Trazodone tablets What is this medicine? TRAZODONE (TRAZ oh done) is used to treat depression. This medicine may be used for other purposes; ask your health care provider or pharmacist if you have questions. COMMON BRAND NAME(S): Desyrel What should I tell my health care provider before I take this medicine? They need to know if you have any of these conditions: -attempted suicide or thinking about it -bipolar disorder -bleeding problems -glaucoma -heart disease, or previous heart attack -irregular heart beat -kidney or liver disease -low levels of sodium in the blood -an unusual or allergic reaction to trazodone, other medicines, foods, dyes or preservatives -pregnant or trying to get pregnant -breast-feeding How should I use this medicine? Take this medicine by mouth with a glass of water. Follow the directions on the prescription label. Take this medicine shortly after a meal or a light snack. Take your medicine at regular intervals. Do not take your medicine more often than directed. Do not stop taking this medicine suddenly except upon the advice of your doctor. Stopping this medicine too quickly may cause serious side effects or your condition may worsen. A special MedGuide will be given to you by the pharmacist with each prescription and refill. Be sure to read this information carefully each time. Talk to your pediatrician regarding the use of this medicine in children. Special care may be needed. Overdosage: If you think you have taken too much of this medicine contact a poison control center or emergency room at once. NOTE: This medicine is only for you. Do not share this medicine with others. What if I miss a dose? If you miss a dose, take it as soon as you can. If it is almost time for your next dose, take only that dose. Do not take double or extra doses. What may interact with this medicine? Do not take this medicine with any of the following medications: -certain medicines  for fungal infections like fluconazole, itraconazole, ketoconazole, posaconazole, voriconazole -cisapride -dofetilide -dronedarone -linezolid -MAOIs like Carbex, Eldepryl, Marplan, Nardil, and Parnate -mesoridazine -methylene blue (injected into a vein) -pimozide -saquinavir -thioridazine -ziprasidone This medicine may also interact with the following medications: -alcohol -antiviral medicines for HIV or AIDS -aspirin and aspirin-like medicines -barbiturates like phenobarbital -certain medicines for blood pressure, heart disease, irregular heart beat -certain medicines for depression, anxiety, or psychotic disturbances -certain medicines for migraine headache like almotriptan, eletriptan, frovatriptan, naratriptan, rizatriptan, sumatriptan, zolmitriptan -certain medicines for seizures like carbamazepine and phenytoin -certain medicines for sleep -certain medicines that treat or prevent blood clots like dalteparin, enoxaparin, warfarin -digoxin -fentanyl -lithium -NSAIDS, medicines for pain and inflammation, like ibuprofen or naproxen -other medicines that prolong the QT interval (cause an abnormal heart rhythm) -rasagiline -supplements like St. John's wort, kava kava, valerian -tramadol -tryptophan This list may not describe all possible interactions. Give your health care provider a list of all the medicines, herbs, non-prescription drugs, or dietary supplements you use. Also tell them if you smoke, drink alcohol, or use illegal drugs. Some items may interact with your medicine. What should I watch for while using this medicine? Tell your doctor if your symptoms do not get better or if they get worse. Visit your doctor or health care professional for regular checks on your progress. Because it may take several weeks to see the full effects of this medicine, it is important to continue your treatment as prescribed by your doctor. Patients and their families should watch out for new  or worsening thoughts of suicide or depression. Also   watch out for sudden changes in feelings such as feeling anxious, agitated, panicky, irritable, hostile, aggressive, impulsive, severely restless, overly excited and hyperactive, or not being able to sleep. If this happens, especially at the beginning of treatment or after a change in dose, call your health care professional. Dennis Bast may get drowsy or dizzy. Do not drive, use machinery, or do anything that needs mental alertness until you know how this medicine affects you. Do not stand or sit up quickly, especially if you are an older patient. This reduces the risk of dizzy or fainting spells. Alcohol may interfere with the effect of this medicine. Avoid alcoholic drinks. This medicine may cause dry eyes and blurred vision. If you wear contact lenses you may feel some discomfort. Lubricating drops may help. See your eye doctor if the problem does not go away or is severe. Your mouth may get dry. Chewing sugarless gum, sucking hard candy and drinking plenty of water may help. Contact your doctor if the problem does not go away or is severe. What side effects may I notice from receiving this medicine? Side effects that you should report to your doctor or health care professional as soon as possible: -allergic reactions like skin rash, itching or hives, swelling of the face, lips, or tongue -elevated mood, decreased need for sleep, racing thoughts, impulsive behavior -confusion -fast, irregular heartbeat -feeling faint or lightheaded, falls -feeling agitated, angry, or irritable -loss of balance or coordination -painful or prolonged erections -restlessness, pacing, inability to keep still -suicidal thoughts or other mood changes -tremors -trouble sleeping -seizures -unusual bleeding or bruising Side effects that usually do not require medical attention (report to your doctor or health care professional if they continue or are bothersome): -change in  sex drive or performance -change in appetite or weight -constipation -headache -muscle aches or pains -nausea This list may not describe all possible side effects. Call your doctor for medical advice about side effects. You may report side effects to FDA at 1-800-FDA-1088. Where should I keep my medicine? Keep out of the reach of children. Store at room temperature between 15 and 30 degrees C (59 to 86 degrees F). Protect from light. Keep container tightly closed. Throw away any unused medicine after the expiration date. NOTE: This sheet is a summary. It may not cover all possible information. If you have questions about this medicine, talk to your doctor, pharmacist, or health care provider.  2018 Elsevier/Gold Standard (2015-07-24 16:57:05) Cariprazine oral capsules What is this medicine? CARIPRAZINE (car i PRA zeen) is an antipsychotic. It is used to treat schizophrenia or bipolar disorder. Bipolar disorder is also known as manic-depression. This medicine may be used for other purposes; ask your health care provider or pharmacist if you have questions. COMMON BRAND NAME(S): VRAYLAR What should I tell my health care provider before I take this medicine? They need to know if you have any of these conditions: -dementia -diabetes -difficulty swallowing -heart disease -history of breast cancer -kidney disease -liver disease -low blood counts, like low white cell, platelet, or red cell counts -low blood pressure -Parkinson's disease -seizures -suicidal thoughts, plans, or attempt; a previous suicide attempt by you or a family member -an unusual or allergic reaction to cariprazine, other medicines, foods, dyes, or preservatives -pregnant or trying to get pregnant -breast-feeding How should I use this medicine? Take this medicine by mouth with a glass of water. Follow the directions on the prescription label. You may take it with or without food. Take your medicine  at regular  intervals. Do not take it more often than directed. Do not stop taking except on your doctor's advice. Talk to your pediatrician regarding the use of this medicine in children. Special care may be needed. Overdosage: If you think you have taken too much of this medicine contact a poison control center or emergency room at once. NOTE: This medicine is only for you. Do not share this medicine with others. What if I miss a dose? If you miss a dose, take it as soon as you can. If it is almost time for your next dose, take only that dose. Do not take double or extra doses. What may interact with this medicine? Do not take this medicine with any of the following medications: -metoclopramide This medicine may also interact with the following medications: -carbamazepine -certain medicines for depression, anxiety, or psychotic disturbances -certain medicines for sleep -itraconazole -ketoconazole -medicines for blood pressure -rifampin -St John's wort This list may not describe all possible interactions. Give your health care provider a list of all the medicines, herbs, non-prescription drugs, or dietary supplements you use. Also tell them if you smoke, drink alcohol, or use illegal drugs. Some items may interact with your medicine. What should I watch for while using this medicine? Visit your doctor or health care professional for regular checks on your progress. It may be several weeks before you see the full effects of this medicine. Notify your doctor or health care professional if your symptoms get worse, if you have new symptoms, if you are having an unusual effect from this medicine, or if you feel out of control, very discouraged or think you might harm yourself or others. Do not suddenly stop taking this medicine. You may need to gradually reduce the dose. Ask your doctor or health care professional for advice. You may get dizzy or drowsy. Do not drive, use machinery, or do anything that needs  mental alertness until you know how this medicine affects you. Do not stand or sit up quickly, especially if you are an older patient. This reduces the risk of dizzy or fainting spells. Alcohol can increase dizziness and drowsiness. Avoid alcoholic drinks. This medicine may cause dry eyes and blurred vision. If you wear contact lenses you may feel some discomfort. Lubricating drops may help. See your eye doctor if the problem does not go away or is severe. This medicine can reduce the response of your body to heat or cold. Dress warm in cold weather and stay hydrated in hot weather. If possible, avoid extreme temperatures like saunas, hot tubs, very hot or cold showers, or activities that can cause dehydration such as vigorous exercise. Women should inform their doctor if they wish to become pregnant or think they might be pregnant. The effects of this medicine on an unborn child are not known. A registry is available to monitor pregnancy outcomes in pregnant women exposed to this medicine or similar medicines. Talk to your health care professional or pharmacist for more information. What side effects may I notice from receiving this medicine? Side effects that you should report to your doctor or health care professional as soon as possible: -allergic reactions like skin rash, itching or hives, swelling of the face, lips, or tongue -changes in emotions or moods -confusion -difficulty swallowing -feeling faint or lightheaded, falls -fever or chills, sore throat -inability to control muscle movements in the face, mouth, hands, arms, or legs -increased hunger or thirst -increased urination -missed or irregular menstrual periods -problems with  balance, talking, walking -redness, blistering, peeling or loosening of the skin, including inside the mouth -seizures -stiff muscles -suicidal thoughts or other mood changes -unusually weak or tired Side effects that usually do not require medical attention  (report to your doctor or health care professional if they continue or are bothersome): -constipation -dizziness -drowsiness -nausea, vomiting -restlessness -upset stomach -weight gain This list may not describe all possible side effects. Call your doctor for medical advice about side effects. You may report side effects to FDA at 1-800-FDA-1088. Where should I keep my medicine? Keep out of the reach of children. Store at room temperature between 15 and 30 degrees C (59 and 86 degrees F). Protect from light. Throw away any unused medicine after the expiration date. NOTE: This sheet is a summary. It may not cover all possible information. If you have questions about this medicine, talk to your doctor, pharmacist, or health care provider.  2018 Elsevier/Gold Standard (2016-01-23 15:40:17)

## 2017-10-28 ENCOUNTER — Ambulatory Visit
Admission: RE | Admit: 2017-10-28 | Discharge: 2017-10-28 | Disposition: A | Discharge status: Home / Self Care | Payer: Medicare Other | Source: Ambulatory Visit | Attending: Nurse Practitioner | Admitting: Nurse Practitioner

## 2017-10-28 DIAGNOSIS — Z1231 Encounter for screening mammogram for malignant neoplasm of breast: Secondary | ICD-10-CM

## 2017-11-12 ENCOUNTER — Other Ambulatory Visit: Payer: Self-pay

## 2017-11-12 ENCOUNTER — Encounter: Payer: Self-pay | Admitting: Emergency Medicine

## 2017-11-12 ENCOUNTER — Emergency Department: Payer: Medicare Other

## 2017-11-12 ENCOUNTER — Emergency Department
Admission: EM | Admit: 2017-11-12 | Discharge: 2017-11-12 | Disposition: A | Discharge status: Home / Self Care | Payer: Medicare Other | Attending: Emergency Medicine | Admitting: Emergency Medicine

## 2017-11-12 DIAGNOSIS — J189 Pneumonia, unspecified organism: Secondary | ICD-10-CM | POA: Diagnosis not present

## 2017-11-12 DIAGNOSIS — I10 Essential (primary) hypertension: Secondary | ICD-10-CM | POA: Insufficient documentation

## 2017-11-12 DIAGNOSIS — Z79899 Other long term (current) drug therapy: Secondary | ICD-10-CM | POA: Insufficient documentation

## 2017-11-12 DIAGNOSIS — R0602 Shortness of breath: Secondary | ICD-10-CM

## 2017-11-12 DIAGNOSIS — E119 Type 2 diabetes mellitus without complications: Secondary | ICD-10-CM | POA: Insufficient documentation

## 2017-11-12 LAB — CBC WITH DIFFERENTIAL/PLATELET
BASOS PCT: 1 %
Basophils Absolute: 0 10*3/uL (ref 0–0.1)
EOS ABS: 0.3 10*3/uL (ref 0–0.7)
Eosinophils Relative: 5 %
HCT: 37.1 % (ref 35.0–47.0)
HEMOGLOBIN: 12.8 g/dL (ref 12.0–16.0)
Lymphocytes Relative: 21 %
Lymphs Abs: 1.5 10*3/uL (ref 1.0–3.6)
MCH: 30.1 pg (ref 26.0–34.0)
MCHC: 34.4 g/dL (ref 32.0–36.0)
MCV: 87.6 fL (ref 80.0–100.0)
MONOS PCT: 8 %
Monocytes Absolute: 0.6 10*3/uL (ref 0.2–0.9)
NEUTROS PCT: 65 %
Neutro Abs: 4.9 10*3/uL (ref 1.4–6.5)
Platelets: 187 10*3/uL (ref 150–440)
RBC: 4.24 MIL/uL (ref 3.80–5.20)
RDW: 15 % — AB (ref 11.5–14.5)
WBC: 7.4 10*3/uL (ref 3.6–11.0)

## 2017-11-12 LAB — BASIC METABOLIC PANEL
ANION GAP: 7 (ref 5–15)
BUN: 10 mg/dL (ref 6–20)
CALCIUM: 8.7 mg/dL — AB (ref 8.9–10.3)
CHLORIDE: 104 mmol/L (ref 98–111)
CO2: 26 mmol/L (ref 22–32)
Creatinine, Ser: 0.52 mg/dL (ref 0.44–1.00)
GLUCOSE: 168 mg/dL — AB (ref 70–99)
Potassium: 3.9 mmol/L (ref 3.5–5.1)
Sodium: 137 mmol/L (ref 135–145)

## 2017-11-12 LAB — TROPONIN I: Troponin I: <0.03 ng/mL (ref <0.03)

## 2017-11-12 LAB — BRAIN NATRIURETIC PEPTIDE: B Natriuretic Peptide: 118 pg/mL — ABNORMAL HIGH (ref 0.0–100.0)

## 2017-11-12 MED ORDER — AZITHROMYCIN 250 MG PO TABS: ORAL_TABLET | ORAL | 0 refills | Status: DC

## 2017-11-12 MED ORDER — AZITHROMYCIN 500 MG PO TABS
500.0000 mg | ORAL_TABLET | Freq: Once | ORAL | Status: AC
  Administered 2017-11-12: 500 mg via ORAL
  Filled 2017-11-12: qty 1

## 2017-11-12 NOTE — ED Notes (Signed)
Pt walked in hallway.  O2 sats at 95% except when talking then decreased to 93%.

## 2017-11-12 NOTE — ED Notes (Signed)
Patient refused to stay for further evaluation. Pulse ox upon walking in the room was 95%, 96% at rest.

## 2017-11-12 NOTE — ED Notes (Signed)
First Nurse Note: Patient complaining of Rachel Luna.  Alert and oriented.  Speech is clear.

## 2017-11-12 NOTE — ED Provider Notes (Signed)
[facility] Emergency Department Provider Note ____________________________________________   I have reviewed the triage vital signs and the triage nursing note.  HISTORY  Chief Complaint Shortness of Breath   Historian Patient  HPI Rachel Luna is a 62 y.o. female with history of obesity, hypertension hyperlipidemia, reports no known history of coronary artery disease, CHF, or lung problems, presents to the ED with 1 to 2 days of feeling of shortness of breath.  States that she feels like it is hard to take a deep breath.  Reports chronic lower extreme edema, no worse than typical.  States that she feels like she has been having abdominal bloating as well.  Denies fever.  States that she is been coughing but without any productive sputum.  No coughing up blood.  Denies fevers.  States that she think she probably does have sleep apnea but has never been formally evaluated for that.  No chest pain.  No nausea or vomiting.      Past Medical History:  Diagnosis Date  . Anxiety   . Arthritis   . Bipolar disorder ([place])   . chronic low back pain   . Chronic low back pain 02/18/2014  . Depression   . Diabetes mellitus without complication ([place])   . Diabetes mellitus, type II ([place])   . Fibroids   . GERD (gastroesophageal reflux disease)   . Gout   . Hyperlipidemia   . Hypertension   . Obesity     Patient Active Problem List   Diagnosis Date Noted  . Seizure disorder ([place]) 10/19/2017  . Ingrown toenail 04/15/2016  . Arthritis of foot, degenerative 04/10/2015  . 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 ANA positive 03/05/2015  . Foot pain 03/05/2015  . Elevated rheumatoid factor 03/05/2015  . BP (high blood pressure) 09/04/2014  . Avitaminosis D 09/04/2014  . Dizzy 07/31/2014  . Chronic low back pain 02/18/2014  . Pain in joint, lower leg 08/10/2013  . Acid reflux 03/12/2013  . Restless legs syndrome (RLS) 02/16/2013  .  Disturbance of skin sensation 02/16/2013  . Menopausal hot flushes 08/14/2012  . Arthritis of knee, degenerative 04/06/2011  . Restless leg 11/11/2010  . Benign essential HTN 07/13/2010  . Elevated fasting blood sugar 07/13/2010  . Cannot sleep 07/13/2010  . Chronic pain 01/08/2010  . Affective disorder, major 11/05/2009  . Major depressive disorder 11/05/2009  . Allergic rhinitis 07/03/2009    Past Surgical History:  Procedure Laterality Date  . ABDOMINAL HYSTERECTOMY    . CHOLECYSTECTOMY    . FOOT SURGERY Left   . KNEE ARTHROPLASTY    . OVARIAN CYST REMOVAL    . SPINE SURGERY      Prior to Admission medications   Medication Sig Start Date End Date Taking? Authorizing Provider  azithromycin (ZITHROMAX) 250 MG tablet 1 tab once daily for 4 more days 11/12/17   Lisa Roca, MD  baclofen (LIORESAL) 10 MG tablet Take 1 tablet (10 mg total) by mouth 3 (three) times daily. 05/30/17   Kathrynn Ducking, MD  carbamazepine (TEGRETOL) 200 MG tablet Take 0.5 tablets (100 mg total) by mouth daily. 07/25/17   Kathrynn Ducking, MD  cariprazine (VRAYLAR) capsule Take 1 capsule (1.5 mg total) by mouth daily. 10/19/17   Ursula Alert, MD  celecoxib (CELEBREX) 100 MG capsule Take 1 capsule by mouth 2 (two) times daily. 07/06/17   [provider]  cetirizine (ZYRTEC) 10 MG tablet Take 10 mg by mouth daily.  [provider]  clobetasol ointment (TEMOVATE) 6.07 % Apply 1 application topically as directed. 07/23/17   [provider]  clotrimazole (LOTRIMIN) 1 % cream Apply 1 application topically 2 (two) times daily. Use for at least 4 weeks (or for 1 week after rash has healed) 09/10/17   Schaevitz, Randall An, MD  DULoxetine (CYMBALTA) 60 MG capsule Take 1 capsule (60 mg total) by mouth daily. 02/18/16   Rainey Pines, MD  estradiol (ESTRACE) 0.5 MG tablet Take 1 tablet (0.5 mg total) by mouth daily. 10/10/17   Shelly Bombard, MD  fluocinonide cream (LIDEX) 0.05 %  09/28/17    [provider]  furosemide (LASIX) 20 MG tablet Take 1 tablet (20 mg total) by mouth daily for 5 days. 07/29/17 08/03/17  Earleen Newport, MD  gabapentin (NEURONTIN) 800 MG tablet Take 1 tablet (800 mg total) by mouth 4 (four) times daily. Patient taking differently: Take 1 tablet (800MG ) by mouth every morning and daily around lunchtime then take 2 tablets (1600MG ) by mouth at bedtime 02/18/16   Rainey Pines, MD  hydrOXYzine (VISTARIL) 25 MG capsule Take 1 capsule (25 mg total) by mouth daily as needed for anxiety (SEVERE ANXIETY ATTACKS). 10/19/17   Ursula Alert, MD  losartan-hydrochlorothiazide (HYZAAR) 50-12.5 MG tablet Take 1 tablet by mouth daily. 11/30/14   [provider]  meloxicam (MOBIC) 15 MG tablet Take 15 mg daily by mouth.  07/04/15   [provider]  pramipexole (MIRAPEX) 0.75 MG tablet TAKE 1 TABLET BY MOUTH ONCE EVERY MORNING AND 2 TABLETS EVERY EVENING. 10/03/17   Kathrynn Ducking, MD  rosuvastatin (CRESTOR) 40 MG tablet Take 1 tablet by mouth daily. 03/23/16   [provider]  traZODone (DESYREL) 50 MG tablet Take 1-2 tablets (50-100 mg total) by mouth at bedtime. SLEEP 10/19/17   Ursula Alert, MD  triamcinolone (KENALOG) 0.025 % ointment  09/20/17   [provider]    Allergies  Allergen Reactions  . Wellbutrin [Bupropion] Other (See Comments)    Irritability and Agitation  . Geodon [Ziprasidone Hcl] Other (See Comments)    Worsened Restless Leg Syndrome  . Lisinopril Cough  . Neupro [Rotigotine] Rash    Family History  Problem Relation Age of Onset  . Diabetes Mother   . Cancer Mother        breast  . Heart disease Mother   . Mental illness Mother        bipolar disorder  . Stroke Mother   . Breast cancer Mother   . Diabetes Father   . Hypertension Father   . Diabetes Brother   . Bipolar disorder Brother   . Diabetes Brother   . Breast cancer Maternal Aunt   . Breast cancer Maternal Grandmother      Social History Social History   Tobacco Use  . Smoking status: Never Smoker  . Smokeless tobacco: Never Used  Substance Use Topics  . Alcohol use: No    Alcohol/week: 0.0 standard drinks  . Drug use: No    Review of Systems  Constitutional: Negative for fever. Eyes: Negative for visual changes. ENT: Negative for sore throat. Cardiovascular: Negative for chest pain. Respiratory: Positive for shortness of breath. Gastrointestinal: Negative for abdominal pain, vomiting and diarrhea. Genitourinary: Negative for dysuria. Musculoskeletal: Negative for back pain. Skin: Negative for rash. Neurological: Negative for headache.  ____________________________________________   PHYSICAL EXAM:  VITAL SIGNS: ED Triage Vitals  Enc Vitals Group     BP 11/12/17 0844 129/83  Pulse Rate 11/12/17 0844 72     Resp 11/12/17 0844 (!) 26     Temp 11/12/17 0844 98.2 F (36.8 C)     Temp Source 11/12/17 0844 Oral     SpO2 11/12/17 0844 95 %     Weight 11/12/17 0845 300 lb (136.1 kg)     Height 11/12/17 0845 5\' 4"  (1.626 m)     Head Circumference --      Peak Flow --      Pain Score 11/12/17 0845 0     Pain Loc --      Pain Edu? --      Excl. in [place]? --      Constitutional: Alert and oriented.  HEENT      Head: Normocephalic and atraumatic.      Eyes: Conjunctivae are normal. Pupils equal and round.       Ears:         Nose: No congestion/rhinnorhea.      Mouth/Throat: Mucous membranes are moist.      Neck: No stridor. Cardiovascular/Chest: Normal rate, regular rhythm.  No murmurs, rubs, or gallops. Respiratory: Decreased air movement throughout, without wheezing or rhonchi. Gastrointestinal: Soft. No distention, no guarding, no rebound. Nontender.    Genitourinary/rectal:Deferred Musculoskeletal: Nontender with normal range of motion in all extremities. No joint effusions.  3+ lower extremity bilateral pitting edema without redness or specific calf tenderness. Neurologic:   Normal speech and language. No gross or focal neurologic deficits are appreciated. Skin:  Skin is warm, dry and intact. No rash noted. Psychiatric: Mood and affect are normal. Speech and behavior are normal. Patient exhibits appropriate insight and judgment.   ____________________________________________  LABS (pertinent positives/negatives) I, Lisa Roca, MD the attending physician have reviewed the labs noted below.  Labs Reviewed  BASIC METABOLIC PANEL - Abnormal; Notable for the following components:      Result Value   Glucose, Bld 168 (*)    Calcium 8.7 (*)    All other components within normal limits  CBC WITH DIFFERENTIAL/PLATELET - Abnormal; Notable for the following components:   RDW 15.0 (*)    All other components within normal limits  BRAIN NATRIURETIC PEPTIDE - Abnormal; Notable for the following components:   B Natriuretic Peptide 118.0 (*)    All other components within normal limits  TROPONIN I    ____________________________________________    EKG I, Lisa Roca, MD, the attending physician have personally viewed and interpreted all ECGs.  68 bpm.  normal sinus rhythm.  Nuclear spinal axis.  Normal ST and T wave ____________________________________________  RADIOLOGY   Chest x-ray two-view: Reviewed by myself.  This is radiologist interpretation iMPRESSION: Increasing interstitial opacities of the bilateral hilar regions of the lungs, either representing atypical infection, bronchitis, or worsening chronic changes. Background changes of emphysema. __________________________________________  PROCEDURES  Procedure(s) performed: None  Procedures  Critical Care performed: None   ____________________________________________  ED COURSE / ASSESSMENT AND PLAN  Pertinent labs & imaging results that were available during my care of the patient were reviewed by me and considered in my medical decision making (see chart for details).    Patient  complaining of subjective shortness of breath with a nonproductive cough x1 to 2 days.  No hypoxia here.  Patient does look volume overloaded to certain extent, however chest x-ray does not show pulmonary edema, and BNP is only 118.  Suspect potential infectious etiology and will treat with azithromycin.  She is not having any pleuritic chest pain,  tachycardia, or hypoxia.  Not suspicious of PE.  Walking O2 sat greater than 95%.    CONSULTATIONS:   None   Patient / Family / Caregiver informed of clinical course, medical decision-making process, and agree with plan.   I discussed return precautions, follow-up instructions, and discharge instructions with patient and/or family.  Discharge Instructions : You are evaluated for shortness of breath, and although no certain cause was found, your exam and evaluation overall reassuring.  We discussed your x-ray shows there may be an early or atypical pneumonia and you are being started on antibiotic for that.  Return to the emerge department immediately for any fever, chest pain, pain with breathing, new or worsening shortness of breath, one-sided leg swelling, or any other symptoms concerning to you.   ___________________________________________   FINAL CLINICAL IMPRESSION(S) / ED DIAGNOSES   Final diagnoses:  Shortness of breath  Community acquired pneumonia, unspecified laterality      ___________________________________________         Note: This dictation was prepared with Sales executive. Any transcriptional errors that result from this process are unintentional    Lisa Roca, MD 11/12/17 1206

## 2017-11-12 NOTE — ED Triage Notes (Signed)
Mildly labored breathing noted. Here for [facility]. Denies asthmal/COPD.  Has had cough for "a while" but getting worse for a few days.  No fevers. No pain at this time.  Color WNL. VSS

## 2017-11-12 NOTE — Discharge Instructions (Addendum)
You are evaluated for shortness of breath, and although no certain cause was found, your exam and evaluation overall reassuring.  We discussed your x-ray shows there may be an early or atypical pneumonia and you are being started on antibiotic for that.  Return to the emerge department immediately for any fever, chest pain, pain with breathing, new or worsening shortness of breath, one-sided leg swelling, or any other symptoms concerning to you.

## 2017-11-14 ENCOUNTER — Other Ambulatory Visit: Payer: Self-pay

## 2017-11-14 ENCOUNTER — Emergency Department: Payer: Medicare Other

## 2017-11-14 ENCOUNTER — Emergency Department
Admission: EM | Admit: 2017-11-14 | Discharge: 2017-11-14 | Disposition: A | Discharge status: Home / Self Care | Payer: Medicare Other | Attending: Emergency Medicine | Admitting: Emergency Medicine

## 2017-11-14 DIAGNOSIS — R0789 Other chest pain: Secondary | ICD-10-CM | POA: Diagnosis not present

## 2017-11-14 DIAGNOSIS — Z79899 Other long term (current) drug therapy: Secondary | ICD-10-CM | POA: Diagnosis not present

## 2017-11-14 DIAGNOSIS — R0600 Dyspnea, unspecified: Secondary | ICD-10-CM

## 2017-11-14 DIAGNOSIS — I10 Essential (primary) hypertension: Secondary | ICD-10-CM | POA: Diagnosis not present

## 2017-11-14 DIAGNOSIS — E119 Type 2 diabetes mellitus without complications: Secondary | ICD-10-CM | POA: Diagnosis not present

## 2017-11-14 DIAGNOSIS — R062 Wheezing: Secondary | ICD-10-CM | POA: Diagnosis not present

## 2017-11-14 DIAGNOSIS — R0602 Shortness of breath: Secondary | ICD-10-CM | POA: Insufficient documentation

## 2017-11-14 DIAGNOSIS — R05 Cough: Secondary | ICD-10-CM | POA: Diagnosis not present

## 2017-11-14 LAB — CBC
HCT: 36.8 % (ref 35.0–47.0)
HEMOGLOBIN: 12.6 g/dL (ref 12.0–16.0)
MCH: 30.4 pg (ref 26.0–34.0)
MCHC: 34.3 g/dL (ref 32.0–36.0)
MCV: 88.7 fL (ref 80.0–100.0)
PLATELETS: 195 10*3/uL (ref 150–440)
RBC: 4.15 MIL/uL (ref 3.80–5.20)
RDW: 15 % — ABNORMAL HIGH (ref 11.5–14.5)
WBC: 6.7 10*3/uL (ref 3.6–11.0)

## 2017-11-14 LAB — COMPREHENSIVE METABOLIC PANEL
ALT: 38 U/L (ref 0–44)
AST: 42 U/L — ABNORMAL HIGH (ref 15–41)
Albumin: 3.6 g/dL (ref 3.5–5.0)
Alkaline Phosphatase: 101 U/L (ref 38–126)
Anion gap: 7 (ref 5–15)
BUN: 13 mg/dL (ref 6–20)
CHLORIDE: 106 mmol/L (ref 98–111)
CO2: 27 mmol/L (ref 22–32)
CREATININE: 0.56 mg/dL (ref 0.44–1.00)
Calcium: 8.8 mg/dL — ABNORMAL LOW (ref 8.9–10.3)
Glucose, Bld: 187 mg/dL — ABNORMAL HIGH (ref 70–99)
Potassium: 3.9 mmol/L (ref 3.5–5.1)
Sodium: 140 mmol/L (ref 135–145)
Total Bilirubin: 0.6 mg/dL (ref 0.3–1.2)
Total Protein: 6.3 g/dL — ABNORMAL LOW (ref 6.5–8.1)

## 2017-11-14 LAB — LIPASE, BLOOD: LIPASE: 23 U/L (ref 11–51)

## 2017-11-14 LAB — TROPONIN I

## 2017-11-14 MED ORDER — GUAIFENESIN-CODEINE 100-10 MG/5ML PO SOLN: 5.0000 mL | Freq: Four times a day (QID) | ORAL | 0 refills | Status: DC | PRN

## 2017-11-14 MED ORDER — IPRATROPIUM-ALBUTEROL 0.5-2.5 (3) MG/3ML IN SOLN
3.0000 mL | Freq: Once | RESPIRATORY_TRACT | Status: AC
  Administered 2017-11-14: 3 mL via RESPIRATORY_TRACT
  Filled 2017-11-14: qty 3

## 2017-11-14 MED ORDER — HYDROCOD POLST-CPM POLST ER 10-8 MG/5ML PO SUER
5.0000 mL | Freq: Once | ORAL | Status: AC
  Administered 2017-11-14: 5 mL via ORAL
  Filled 2017-11-14: qty 5

## 2017-11-14 NOTE — ED Provider Notes (Addendum)
[facility] Emergency Department Provider Note  Time seen: 9:25 AM  I have reviewed the triage vital signs and the nursing notes.   HISTORY  Chief Complaint Chest Pain and Shortness of Breath    HPI Rachel Luna is a 62 y.o. female with a past medical history of anxiety, arthritis, bipolar, diabetes, gastric reflux, hypertension, hyperlipidemia who presents to the emergency department for trouble breathing and chest pain.  According to the patient for the past 1 week she has been coughing and wheezing.  Was seen in the emergency ointment 1 week ago for the same was diagnosed with pneumonia placed on antibiotics per patient states she has continued to worsen despite antibiotics and developed chest discomfort over the past 2 to 3 days so she came to the emergency department for evaluation.  Patient states lower extremity edema which is mild and chronic denies any acute change.  States cough but denies any sputum production.  Occasional wheeze.  Patient describes the chest pain is being in the center lower portion of the chest and intermittent over the last several days.   Past Medical History:  Diagnosis Date  . Anxiety   . Arthritis   . Bipolar disorder ([place])   . chronic low back pain   . Chronic low back pain 02/18/2014  . Depression   . Diabetes mellitus without complication ([place])   . Diabetes mellitus, type II ([place])   . Fibroids   . GERD (gastroesophageal reflux disease)   . Gout   . Hyperlipidemia   . Hypertension   . Obesity     Patient Active Problem List   Diagnosis Date Noted  . Seizure disorder ([place]) 10/19/2017  . Ingrown toenail 04/15/2016  . Arthritis of foot, degenerative 04/10/2015  . 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 ANA positive 03/05/2015  . Foot pain 03/05/2015  . Elevated rheumatoid factor 03/05/2015  . BP (high blood pressure) 09/04/2014  . Avitaminosis D 09/04/2014  . Dizzy 07/31/2014  . Chronic low back pain  02/18/2014  . Pain in joint, lower leg 08/10/2013  . Acid reflux 03/12/2013  . Restless legs syndrome (RLS) 02/16/2013  . Disturbance of skin sensation 02/16/2013  . Menopausal hot flushes 08/14/2012  . Arthritis of knee, degenerative 04/06/2011  . Restless leg 11/11/2010  . Benign essential HTN 07/13/2010  . Elevated fasting blood sugar 07/13/2010  . Cannot sleep 07/13/2010  . Chronic pain 01/08/2010  . Affective disorder, major 11/05/2009  . Major depressive disorder 11/05/2009  . Allergic rhinitis 07/03/2009    Past Surgical History:  Procedure Laterality Date  . ABDOMINAL HYSTERECTOMY    . CHOLECYSTECTOMY    . FOOT SURGERY Left   . KNEE ARTHROPLASTY    . OVARIAN CYST REMOVAL    . SPINE SURGERY      Prior to Admission medications   Medication Sig Start Date End Date Taking? Authorizing Provider  azithromycin (ZITHROMAX) 250 MG tablet 1 tab once daily for 4 more days 11/12/17   Lisa Roca, MD  baclofen (LIORESAL) 10 MG tablet Take 1 tablet (10 mg total) by mouth 3 (three) times daily. 05/30/17   Kathrynn Ducking, MD  carbamazepine (TEGRETOL) 200 MG tablet Take 0.5 tablets (100 mg total) by mouth daily. 07/25/17   Kathrynn Ducking, MD  cariprazine (VRAYLAR) capsule Take 1 capsule (1.5 mg total) by mouth daily. 10/19/17   Ursula Alert, MD  celecoxib (CELEBREX) 100 MG capsule Take 1 capsule by mouth 2 (two) times  daily. 07/06/17   [provider]  cetirizine (ZYRTEC) 10 MG tablet Take 10 mg by mouth daily.    [provider]  clobetasol ointment (TEMOVATE) 6.96 % Apply 1 application topically as directed. 07/23/17   [provider]  clotrimazole (LOTRIMIN) 1 % cream Apply 1 application topically 2 (two) times daily. Use for at least 4 weeks (or for 1 week after rash has healed) 09/10/17   Schaevitz, Randall An, MD  DULoxetine (CYMBALTA) 60 MG capsule Take 1 capsule (60 mg total) by mouth daily. 02/18/16   Rainey Pines, MD  estradiol (ESTRACE) 0.5 MG  tablet Take 1 tablet (0.5 mg total) by mouth daily. 10/10/17   Shelly Bombard, MD  fluocinonide cream (LIDEX) 0.05 %  09/28/17   [provider]  furosemide (LASIX) 20 MG tablet Take 1 tablet (20 mg total) by mouth daily for 5 days. 07/29/17 08/03/17  Earleen Newport, MD  gabapentin (NEURONTIN) 800 MG tablet Take 1 tablet (800 mg total) by mouth 4 (four) times daily. Patient taking differently: Take 1 tablet (800MG ) by mouth every morning and daily around lunchtime then take 2 tablets (1600MG ) by mouth at bedtime 02/18/16   Rainey Pines, MD  hydrOXYzine (VISTARIL) 25 MG capsule Take 1 capsule (25 mg total) by mouth daily as needed for anxiety (SEVERE ANXIETY ATTACKS). 10/19/17   Ursula Alert, MD  losartan-hydrochlorothiazide (HYZAAR) 50-12.5 MG tablet Take 1 tablet by mouth daily. 11/30/14   [provider]  meloxicam (MOBIC) 15 MG tablet Take 15 mg daily by mouth.  07/04/15   [provider]  pramipexole (MIRAPEX) 0.75 MG tablet TAKE 1 TABLET BY MOUTH ONCE EVERY MORNING AND 2 TABLETS EVERY EVENING. 10/03/17   Kathrynn Ducking, MD  rosuvastatin (CRESTOR) 40 MG tablet Take 1 tablet by mouth daily. 03/23/16   [provider]  traZODone (DESYREL) 50 MG tablet Take 1-2 tablets (50-100 mg total) by mouth at bedtime. SLEEP 10/19/17   Ursula Alert, MD  triamcinolone (KENALOG) 0.025 % ointment  09/20/17   [provider]    Allergies  Allergen Reactions  . Wellbutrin [Bupropion] Other (See Comments)    Irritability and Agitation  . Geodon [Ziprasidone Hcl] Other (See Comments)    Worsened Restless Leg Syndrome  . Lisinopril Cough  . Neupro [Rotigotine] Rash    Family History  Problem Relation Age of Onset  . Diabetes Mother   . Cancer Mother        breast  . Heart disease Mother   . Mental illness Mother        bipolar disorder  . Stroke Mother   . Breast cancer Mother   . Diabetes Father   . Hypertension Father   . Diabetes Brother   .  Bipolar disorder Brother   . Diabetes Brother   . Breast cancer Maternal Aunt   . Breast cancer Maternal Grandmother     Social History Social History   Tobacco Use  . Smoking status: Never Smoker  . Smokeless tobacco: Never Used  Substance Use Topics  . Alcohol use: No    Alcohol/week: 0.0 standard drinks  . Drug use: No    Review of Systems Constitutional: Negative for fever. Cardiovascular: Central lower chest discomfort Respiratory: Positive for shortness of breath Gastrointestinal: Negative for abdominal pain, vomiting  Genitourinary: Negative for urinary compaints Musculoskeletal: Mild lower extremity edema, chronic per patient unchanged. Skin: Negative for skin complaints  Neurological: Negative for headache All other ROS negative  ____________________________________________  PHYSICAL EXAM:  VITAL SIGNS: ED Triage Vitals  Enc Vitals Group     BP 11/14/17 0923 (!) 161/82     Pulse Rate 11/14/17 0923 78     Resp 11/14/17 0923 15     Temp 11/14/17 0923 97.9 F (36.6 C)     Temp Source 11/14/17 0923 Oral     SpO2 11/14/17 0923 98 %     Weight 11/14/17 0921 300 lb (136.1 kg)     Height 11/14/17 0921 5\' 4"  (1.626 m)     Head Circumference --      Peak Flow --      Pain Score 11/14/17 0921 5     Pain Loc --      Pain Edu? --      Excl. in [place]? --    Constitutional: Alert and oriented. Well appearing and in no distress. Eyes: Normal exam ENT   Head: Normocephalic and atraumatic.   Mouth/Throat: Mucous membranes are moist. Cardiovascular: Normal rate, regular rhythm. No murmur Respiratory: Normal respiratory effort without tachypnea nor retractions. Breath sounds are clear Gastrointestinal: Soft and nontender. No distention.   Musculoskeletal: Nontender with normal range of motion in all extremities.  Mild lower extremity edema, equal bilaterally. Neurologic:  Normal speech and language. No gross focal neurologic deficits  Skin:  Skin is warm, dry  and intact.  Psychiatric: Mood and affect are normal. Speech and behavior are normal.   ____________________________________________    EKG  EKG reviewed and interpreted by myself shows normal sinus rhythm at 77 bpm with a narrow QRS, normal axis, normal intervals, no concerning ST changes.  ____________________________________________    RADIOLOGY  Chest x-ray most consistent with bronchitis.  No pneumonia.  ____________________________________________   INITIAL IMPRESSION / ASSESSMENT AND PLAN / ED COURSE  Pertinent labs & imaging results that were available during my care of the patient were reviewed by me and considered in my medical decision making (see chart for details).  The patient presents to the emergency department for shortness of breath occasional cough and chest discomfort.  Differential would include ACS, pneumonia, pneumothorax, URI.  We will check labs, chest x-ray and continue to closely monitor.  EKG is reassuring.  Patient is labs are resulted largely within normal limits including a negative troponin.  Chest x-ray shows possible bronchitis no pneumonia.  Patient is feeling much better.  Dosed cough medication in the emergency department.  I suspect likely bronchitis/pleurisy as a cause of her discomfort.  We will treat with a codeine-based cough medication.  I did discuss with the patient to continue her antibiotics until the course is completed and follow-up with her doctor.  I also discussed my normal chest pain return precautions.  Patient agreeable to plan of care.  ____________________________________________   FINAL CLINICAL IMPRESSION(S) / ED DIAGNOSES  Chest pain Dyspnea Bronchitis   Harvest Dark, MD 11/14/17 1127    Harvest Dark, MD 11/14/17 1524

## 2017-11-14 NOTE — ED Triage Notes (Signed)
Pt arrived via ems for report of shortness of breath and chest pain - she was seen Saturday and dx with pneumonia - pt reports that cough and wheezing have gotten increasingly worse over night

## 2017-11-16 ENCOUNTER — Ambulatory Visit: Payer: Medicare Other | Admitting: Psychiatry

## 2017-11-16 ENCOUNTER — Encounter: Payer: Self-pay | Admitting: Psychiatry

## 2017-11-16 ENCOUNTER — Other Ambulatory Visit: Payer: Self-pay

## 2017-11-16 ENCOUNTER — Ambulatory Visit (INDEPENDENT_AMBULATORY_CARE_PROVIDER_SITE_OTHER): Payer: Medicare Other | Admitting: Psychiatry

## 2017-11-16 VITALS — BP 164/93 | HR 74 | Wt 289.6 lb

## 2017-11-16 DIAGNOSIS — F5105 Insomnia due to other mental disorder: Secondary | ICD-10-CM

## 2017-11-16 DIAGNOSIS — F411 Generalized anxiety disorder: Secondary | ICD-10-CM

## 2017-11-16 DIAGNOSIS — F1321 Sedative, hypnotic or anxiolytic dependence, in remission: Secondary | ICD-10-CM | POA: Diagnosis not present

## 2017-11-16 DIAGNOSIS — F3160 Bipolar disorder, current episode mixed, unspecified: Secondary | ICD-10-CM

## 2017-11-16 MED ORDER — HYDROXYZINE PAMOATE 25 MG PO CAPS: 25.0000 mg | ORAL_CAPSULE | Freq: Every day | ORAL | 1 refills | Status: DC | PRN

## 2017-11-16 MED ORDER — CARIPRAZINE HCL 1.5 MG PO CAPS: 1.5000 mg | ORAL_CAPSULE | Freq: Every day | ORAL | 0 refills | Status: DC

## 2017-11-16 MED ORDER — DULOXETINE HCL 60 MG PO CPEP: 60.0000 mg | ORAL_CAPSULE | Freq: Every day | ORAL | 1 refills | Status: DC

## 2017-11-16 NOTE — Progress Notes (Signed)
[facility] MD OP Progress Note  11/16/2017 12:59 PM Rachel Luna  MRN:  [id number]  Chief Complaint: ' I am here for follow up.' Chief Complaint    Follow-up; Medication Refill     HPI: Rachel Luna is a 62 year old Caucasian female, divorced, on disability, lives in [place], has a history of bipolar disorder, anxiety disorder, insomnia, benzodiazepine dependence in remission, seizure disorder, morbidly obese, chronic back pain, neuropathy, bronchiolitis, presented to the clinic today for a follow-up visit.  Patient today reports she is currently struggling with bronchitis and was seen in the emergency department twice recently.  Patient reports she is currently on medications for her respiratory infection however she continues to struggle.  She reports she continues to have a cough.  She is planning to return to her provider again soon.  Patient reports since she is not physically feeling well it is hard for her to tell how she is doing emotionally.  She does not know if the Arman Filter is helping yet or not.  She was started on Vraylar on 8/14.  She continues to struggle with sleep.  She reports the trazodone makes her restless leg syndrome worse.  She reports she likes melatonin better and it helps her to rest.  Patient continues to be in psychotherapy with Miguel Dibble which is going well.  Patient denies any suicidality.  Patient denies any homicidality.  Patient denies any perceptual disturbances.   Visit Diagnosis:    ICD-10-CM   1. Bipolar I disorder, most recent episode mixed ([place]) F31.60 cariprazine (VRAYLAR) capsule    hydrOXYzine (VISTARIL) 25 MG capsule  2. GAD (generalized anxiety disorder) F41.1 hydrOXYzine (VISTARIL) 25 MG capsule  3. Insomnia due to mental disorder F51.05   4. Benzodiazepine dependence in remission ([place]) F13.21     Past Psychiatric History: Reviewed past psychiatric history from my progress note on 10/19/2017.  Trials of Abilify, Klonopin, Elavil, Cymbalta, Tegretol,  Effexor, trazodone.  Past Medical History:  Past Medical History:  Diagnosis Date  . Anxiety   . Arthritis   . Bipolar disorder ([place])   . chronic low back pain   . Chronic low back pain 02/18/2014  . Depression   . Diabetes mellitus without complication ([place])   . Diabetes mellitus, type II ([place])   . Fibroids   . GERD (gastroesophageal reflux disease)   . Gout   . Hyperlipidemia   . Hypertension   . Obesity     Past Surgical History:  Procedure Laterality Date  . ABDOMINAL HYSTERECTOMY    . CHOLECYSTECTOMY    . FOOT SURGERY Left   . KNEE ARTHROPLASTY    . OVARIAN CYST REMOVAL    . SPINE SURGERY      Family Psychiatric History: Reviewed family psychiatric history from my progress note on 10/19/2017.  Family History:  Family History  Problem Relation Age of Onset  . Diabetes Mother   . Cancer Mother        breast  . Heart disease Mother   . Mental illness Mother        bipolar disorder  . Stroke Mother   . Breast cancer Mother   . Diabetes Father   . Hypertension Father   . Diabetes Brother   . Bipolar disorder Brother   . Diabetes Brother   . Breast cancer Maternal Aunt   . Breast cancer Maternal Grandmother     Social History:  Social History   Socioeconomic History  . Marital status: Divorced    Spouse name:  Not on file  . Number of children: 0  . Years of education: 10  . Highest education level: 10th grade  Occupational History    Employer: UNEMPLOYED  Social Needs  . Financial resource strain: Very hard  . Food insecurity:    Worry: Often true    Inability: Often true  . Transportation needs:    Medical: No    Non-medical: No  Tobacco Use  . Smoking status: Passive Smoke Exposure - Never Smoker  . Smokeless tobacco: Never Used  . Tobacco comment: room mate smokes  Substance and Sexual Activity  . Alcohol use: No    Alcohol/week: 0.0 standard drinks  . Drug use: No  . Sexual activity: Not Currently  Lifestyle  . Physical activity:     Days per week: 0 days    Minutes per session: 0 min  . Stress: Very much  Relationships  . Social connections:    Talks on phone: Three times a week    Gets together: Once a week    Attends religious service: 1 to 4 times per year    Active member of club or organization: No    Attends meetings of clubs or organizations: Never    Relationship status: Divorced  Other Topics Concern  . Not on file  Social History Narrative   Patient is divorced and lives with a roommate.   Patient is on disability.   Patient has a 10th grade education.   Patient is right-handed.   Patient drinks 6 sodas daily.          Allergies:  Allergies  Allergen Reactions  . Wellbutrin [Bupropion] Other (See Comments)    Irritability and Agitation  . Geodon [Ziprasidone Hcl] Other (See Comments)    Worsened Restless Leg Syndrome  . Lisinopril Cough  . Neupro [Rotigotine] Rash    Metabolic Disorder Labs: Lab Results  Component Value Date   HGBA1C 6.6 (H) 07/11/2014   No results found for: PROLACTIN Lab Results  Component Value Date   CHOL 220 (H) 03/25/2014   TRIG 204 (H) 03/25/2014   HDL 75 (H) 03/25/2014   VLDL 41 (H) 03/25/2014   LDLCALC 104 (H) 03/25/2014   No results found for: TSH  Therapeutic Level Labs: No results found for: LITHIUM No results found for: VALPROATE No components found for:  CBMZ  Current Medications: Current Outpatient Medications  Medication Sig Dispense Refill  . azithromycin (ZITHROMAX) 250 MG tablet 1 tab once daily for 4 more days 4 each 0  . baclofen (LIORESAL) 10 MG tablet Take 1 tablet (10 mg total) by mouth 3 (three) times daily. 270 tablet 1  . carbamazepine (TEGRETOL) 200 MG tablet Take 0.5 tablets (100 mg total) by mouth daily. 45 tablet 3  . cariprazine (VRAYLAR) capsule Take 1 capsule (1.5 mg total) by mouth daily. 90 capsule 0  . celecoxib (CELEBREX) 100 MG capsule Take 1 capsule by mouth 2 (two) times daily.    . cetirizine (ZYRTEC) 10 MG tablet  Take 10 mg by mouth daily.    . clobetasol ointment (TEMOVATE) 2.68 % Apply 1 application topically as directed.    . clotrimazole (LOTRIMIN) 1 % cream Apply 1 application topically 2 (two) times daily. Use for at least 4 weeks (or for 1 week after rash has healed) 30 g 1  . DULoxetine (CYMBALTA) 60 MG capsule Take 1 capsule (60 mg total) by mouth daily. 90 capsule 1  . estradiol (ESTRACE) 0.5 MG tablet Take 1 tablet (  0.5 mg total) by mouth daily. 30 tablet 11  . fluocinonide cream (LIDEX) 0.05 %     . gabapentin (NEURONTIN) 800 MG tablet Take 1 tablet (800 mg total) by mouth 4 (four) times daily. (Patient taking differently: Take 1 tablet (800MG ) by mouth every morning and daily around lunchtime then take 2 tablets (1600MG ) by mouth at bedtime) 120 tablet 6  . guaiFENesin-codeine 100-10 MG/5ML syrup Take 5 mLs by mouth every 6 (six) hours as needed for cough. 120 mL 0  . hydrOXYzine (VISTARIL) 25 MG capsule Take 1 capsule (25 mg total) by mouth daily as needed for anxiety (SEVERE ANXIETY ATTACKS). 90 capsule 1  . losartan-hydrochlorothiazide (HYZAAR) 50-12.5 MG tablet Take 1 tablet by mouth daily.    . meloxicam (MOBIC) 15 MG tablet Take 15 mg daily by mouth.     . pramipexole (MIRAPEX) 0.75 MG tablet TAKE 1 TABLET BY MOUTH ONCE EVERY MORNING AND 2 TABLETS EVERY EVENING. 270 tablet 2  . rosuvastatin (CRESTOR) 40 MG tablet Take 1 tablet by mouth daily.    Marland Kitchen triamcinolone (KENALOG) 0.025 % ointment     . furosemide (LASIX) 20 MG tablet Take 1 tablet (20 mg total) by mouth daily for 5 days. 5 tablet 0   No current facility-administered medications for this visit.      Musculoskeletal: Strength & Muscle Tone: within normal limits Gait & Station: normal Patient leans: N/A  Psychiatric Specialty Exam: Review of Systems  Psychiatric/Behavioral: Positive for depression. The patient is nervous/anxious and has insomnia.   All other systems reviewed and are negative.   Blood pressure (!) 164/93,  pulse 74, weight 289 lb 9.6 oz (131.4 kg), SpO2 (!) 87 %.Body mass index is 49.71 kg/m.  General Appearance: Casual  Eye Contact:  Fair  Speech:  Clear and Coherent  Volume:  Normal  Mood:  Anxious and Dysphoric  Affect:  Congruent  Thought Process:  Goal Directed and Descriptions of Associations: Intact  Orientation:  Full (Time, Place, and Person)  Thought Content: Logical   Suicidal Thoughts:  No  Homicidal Thoughts:  No  Memory:  Immediate;   Fair Recent;   Fair Remote;   Fair  Judgement:  Fair  Insight:  Fair  Psychomotor Activity:  Normal  Concentration:  Concentration: Fair and Attention Span: Fair  Recall:  [employer] of Knowledge: Fair  Language: Fair  Akathisia:  No  Handed:  Right  AIMS (if indicated): denies rigidity,stiffness  Assets:  Communication Skills Desire for Improvement Social Support  ADL's:  Intact  Cognition: WNL  Sleep:  Poor   Screenings:   Assessment and Plan: Harmoney is a 62 year old Caucasian female who is divorced, on disability, lives in [place] with a roommate, has a history of bipolar disorder mixed episode, insomnia, anxiety disorder, seizure disorder, neuropathy chronic back pain morbid obesity, presented to the clinic today for a follow-up visit.  Patient recently was seen in the emergency department x2 for bronchiolitis.  Patient continues to struggle with the same.  Patient continues to struggle with sleep.  Discussed the following medication changes.  She will continue psychotherapy with [name].  Plan as noted below.  Plan For bipolar disorder Continue Vraylar 1.5 mg p.o. daily. She will continue Tegretol 100 mg p.o. daily for now.  Her Tegretol is currently being tapered down due to side effects.  She takes it for her seizures and is being managed by neurology.  Anxiety disorder Continue Cymbalta 60 mg p.o. daily Hydroxyzine 25  mg p.o. daily as needed.  Insomnia Discontinue trazodone for side effects. Start melatonin  3-6 mg p.o. nightly.  Discussed with patient to take it 90 minutes before bedtime.  Reviewed the following labs in [facility] R-vitamin B12-04/12/2017-2 94.  AST-45-slightly elevated on 11/14/2017, EKG-QTC within normal limits on 11/14/2017. Pending TSH.  Discussed with patient that no further medication changes will be done today since she is struggling with bronchiolitis.  She will reach out to her provider again and will return to clinic once she feels better.  Patient with elevated blood pressure as well as SpO2 of 87 %, likely due to her respiratory infection.  Follow-up in clinic in 4 weeks or sooner if needed.  More than 50 % of the time was spent for psychoeducation and supportive psychotherapy and care coordination.  This note was generated in part or whole with voice recognition software. Voice recognition is usually quite accurate but there are transcription errors that can and very often do occur. I apologize for any typographical errors that were not detected and corrected.         Ursula Alert, MD 11/16/2017, 12:59 PM

## 2017-11-17 ENCOUNTER — Telehealth: Payer: Self-pay

## 2017-11-17 NOTE — Telephone Encounter (Signed)
Thanks [employer] she feels better soon.

## 2017-11-17 NOTE — Telephone Encounter (Signed)
called pt to see how she was feeling yesterday her bp was up and oxygen low.  pt states she is coughing stuff up today. and she feels a little better but that she has an appt tomorrow with her pcp.

## 2017-12-06 ENCOUNTER — Other Ambulatory Visit: Payer: Self-pay | Admitting: Neurology

## 2017-12-16 ENCOUNTER — Other Ambulatory Visit: Payer: Self-pay

## 2017-12-16 ENCOUNTER — Ambulatory Visit (INDEPENDENT_AMBULATORY_CARE_PROVIDER_SITE_OTHER): Payer: Medicare Other | Admitting: Psychiatry

## 2017-12-16 ENCOUNTER — Encounter: Payer: Self-pay | Admitting: Psychiatry

## 2017-12-16 VITALS — BP 150/81 | HR 80 | Temp 97.9°F | Ht 64.5 in | Wt 274.2 lb

## 2017-12-16 DIAGNOSIS — F1321 Sedative, hypnotic or anxiolytic dependence, in remission: Secondary | ICD-10-CM

## 2017-12-16 DIAGNOSIS — F3160 Bipolar disorder, current episode mixed, unspecified: Secondary | ICD-10-CM

## 2017-12-16 DIAGNOSIS — F5105 Insomnia due to other mental disorder: Secondary | ICD-10-CM | POA: Diagnosis not present

## 2017-12-16 DIAGNOSIS — F411 Generalized anxiety disorder: Secondary | ICD-10-CM

## 2017-12-16 NOTE — Progress Notes (Signed)
[facility] MD OP Progress Note  12/16/2017 11:13 AM HALO SHEVLIN  MRN:  [id number]  Chief Complaint: ' I am here for follow up.' Chief Complaint    Follow-up; Medication Refill     HPI: Cayla is a 62 year old Caucasian female, divorced, on disability, lives in [place], has a history of bipolar disorder, anxiety disorder, insomnia, benzodiazepine dependence in remission, seizure disorder, morbid obesity, chronic back pain, neuropathy, presented to the clinic today for a follow-up visit.  Patient today reports she is doing well on the [place]. Pt reports her mood symptoms as more stable.  She is not upset or irritable as frequently as she used to be before.  She denies any side effects.  Reports sleep is improved.  She is currently on melatonin.  She reports her father was going through some medical problems and she had to take care of him for at least 3 weeks.  She reports her father continues to have a very controlling personality  and hence she decided to return home a few days ago.  She reports she does not ruminate much about that anymore.  She reports she is currently focusing on herself and her health.  She reports she continues to have psychotherapy visits with [name] which is going well.  Denies any suicidality or homicidality.  She denies any perceptual disturbances.  She reports she looks forward to Halloween and is looking forward to decorating her house. Visit Diagnosis:    ICD-10-CM   1. Bipolar I disorder, most recent episode mixed ([place]) F31.60    improving  2. GAD (generalized anxiety disorder) F41.1   3. Insomnia due to mental disorder F51.05   4. Benzodiazepine dependence in remission ([place]) F13.21     Past Psychiatric History: Reviewed past psychiatric history from my progress note on 10/19/2017.  Trials of Abilify, Klonopin, Elavil, Cymbalta, Tegretol, Effexor, trazodone.  Past Medical History:  Past Medical History:  Diagnosis Date  . Anxiety   . Arthritis    . Bipolar disorder ([place])   . chronic low back pain   . Chronic low back pain 02/18/2014  . Depression   . Diabetes mellitus without complication ([place])   . Diabetes mellitus, type II ([place])   . Fibroids   . GERD (gastroesophageal reflux disease)   . Gout   . Hyperlipidemia   . Hypertension   . Obesity     Past Surgical History:  Procedure Laterality Date  . ABDOMINAL HYSTERECTOMY    . CHOLECYSTECTOMY    . FOOT SURGERY Left   . KNEE ARTHROPLASTY    . OVARIAN CYST REMOVAL    . SPINE SURGERY      Family Psychiatric History: I have reviewed family psychiatric history from my progress note on 10/19/2017.  Family History:  Family History  Problem Relation Age of Onset  . Diabetes Mother   . Cancer Mother        breast  . Heart disease Mother   . Mental illness Mother        bipolar disorder  . Stroke Mother   . Breast cancer Mother   . Diabetes Father   . Hypertension Father   . Diabetes Brother   . Bipolar disorder Brother   . Diabetes Brother   . Breast cancer Maternal Aunt   . Breast cancer Maternal Grandmother     Social History: Have reviewed social history from my progress note on 10/19/2017. Social History   Socioeconomic History  . Marital status: Divorced  Spouse name: Not on file  . Number of children: 0  . Years of education: 10  . Highest education level: 10th grade  Occupational History    Employer: UNEMPLOYED  Social Needs  . Financial resource strain: Very hard  . Food insecurity:    Worry: Often true    Inability: Often true  . Transportation needs:    Medical: No    Non-medical: No  Tobacco Use  . Smoking status: Passive Smoke Exposure - Never Smoker  . Smokeless tobacco: Never Used  . Tobacco comment: room mate smokes  Substance and Sexual Activity  . Alcohol use: No    Alcohol/week: 0.0 standard drinks  . Drug use: No  . Sexual activity: Not Currently  Lifestyle  . Physical activity:    Days per week: 0 days    Minutes per  session: 0 min  . Stress: Very much  Relationships  . Social connections:    Talks on phone: Three times a week    Gets together: Once a week    Attends religious service: 1 to 4 times per year    Active member of club or organization: No    Attends meetings of clubs or organizations: Never    Relationship status: Divorced  Other Topics Concern  . Not on file  Social History Narrative   Patient is divorced and lives with a roommate.   Patient is on disability.   Patient has a 10th grade education.   Patient is right-handed.   Patient drinks 6 sodas daily.          Allergies:  Allergies  Allergen Reactions  . Wellbutrin [Bupropion] Other (See Comments)    Irritability and Agitation  . Geodon [Ziprasidone Hcl] Other (See Comments)    Worsened Restless Leg Syndrome  . Lisinopril Cough  . Neupro [Rotigotine] Rash    Metabolic Disorder Labs: Lab Results  Component Value Date   HGBA1C 6.6 (H) 07/11/2014   No results found for: PROLACTIN Lab Results  Component Value Date   CHOL 220 (H) 03/25/2014   TRIG 204 (H) 03/25/2014   HDL 75 (H) 03/25/2014   VLDL 41 (H) 03/25/2014   LDLCALC 104 (H) 03/25/2014   No results found for: TSH  Therapeutic Level Labs: No results found for: LITHIUM No results found for: VALPROATE No components found for:  CBMZ  Current Medications: Current Outpatient Medications  Medication Sig Dispense Refill  . azithromycin (ZITHROMAX) 250 MG tablet 1 tab once daily for 4 more days 4 each 0  . baclofen (LIORESAL) 10 MG tablet TAKE 1 TABLET BY MOUTH 3 TIMES DAILY. 270 each 1  . carbamazepine (TEGRETOL) 200 MG tablet Take 0.5 tablets (100 mg total) by mouth daily. 45 tablet 3  . cariprazine (VRAYLAR) capsule Take 1 capsule (1.5 mg total) by mouth daily. 90 capsule 0  . celecoxib (CELEBREX) 100 MG capsule Take 1 capsule by mouth 2 (two) times daily.    . cetirizine (ZYRTEC) 10 MG tablet Take 10 mg by mouth daily.    . clobetasol ointment  (TEMOVATE) 7.35 % Apply 1 application topically as directed.    . clotrimazole (LOTRIMIN) 1 % cream Apply 1 application topically 2 (two) times daily. Use for at least 4 weeks (or for 1 week after rash has healed) 30 g 1  . DULoxetine (CYMBALTA) 60 MG capsule Take 1 capsule (60 mg total) by mouth daily. 90 capsule 1  . estradiol (ESTRACE) 0.5 MG tablet Take 1 tablet (0.5 mg  total) by mouth daily. 30 tablet 11  . fluocinonide cream (LIDEX) 0.05 %     . gabapentin (NEURONTIN) 800 MG tablet Take 1 tablet (800 mg total) by mouth 4 (four) times daily. (Patient taking differently: Take 1 tablet (800MG ) by mouth every morning and daily around lunchtime then take 2 tablets (1600MG ) by mouth at bedtime) 120 tablet 6  . guaiFENesin-codeine 100-10 MG/5ML syrup Take 5 mLs by mouth every 6 (six) hours as needed for cough. 120 mL 0  . hydrOXYzine (VISTARIL) 25 MG capsule Take 1 capsule (25 mg total) by mouth daily as needed for anxiety (SEVERE ANXIETY ATTACKS). 90 capsule 1  . meloxicam (MOBIC) 15 MG tablet Take 15 mg daily by mouth.     . pramipexole (MIRAPEX) 0.75 MG tablet TAKE 1 TABLET BY MOUTH ONCE EVERY MORNING AND 2 TABLETS EVERY EVENING. 270 tablet 2  . rosuvastatin (CRESTOR) 40 MG tablet Take 1 tablet by mouth daily.    Marland Kitchen triamcinolone (KENALOG) 0.025 % ointment     . furosemide (LASIX) 20 MG tablet Take 1 tablet (20 mg total) by mouth daily for 5 days. 5 tablet 0  . glimepiride (AMARYL) 2 MG tablet     . losartan-hydrochlorothiazide (HYZAAR) 100-25 MG tablet      No current facility-administered medications for this visit.      Musculoskeletal: Strength & Muscle Tone: within normal limits Gait & Station: normal Patient leans: N/A  Psychiatric Specialty Exam: Review of Systems  Psychiatric/Behavioral: The patient is nervous/anxious.   All other systems reviewed and are negative.   Blood pressure (!) 150/81, pulse 80, temperature 97.9 F (36.6 C), temperature source Oral, height 5' 4.5"  (1.638 m), weight 274 lb 3.2 oz (124.4 kg).Body mass index is 46.34 kg/m.  General Appearance: Casual  Eye Contact:  Fair  Speech:  Clear and Coherent  Volume:  Normal  Mood:  Anxious  Affect:  Congruent  Thought Process:  Goal Directed and Descriptions of Associations: Intact  Orientation:  Full (Time, Place, and Person)  Thought Content: Logical   Suicidal Thoughts:  No  Homicidal Thoughts:  No  Memory:  Immediate;   Fair Recent;   Fair Remote;   Fair  Judgement:  Fair  Insight:  Fair  Psychomotor Activity:  Normal  Concentration:  Concentration: Fair and Attention Span: Fair  Recall:  [employer] of Knowledge: Fair  Language: Fair  Akathisia:  No  Handed:  Right  AIMS (if indicated): 0  Assets:  Communication Skills Desire for Improvement Social Support  ADL's:  Intact  Cognition: WNL  Sleep:  improving   Screenings:   Assessment and Plan: Kynlei is a 62 year old Caucasian female, divorced, on disability, lives in [place] with a roommate, has a history of bipolar disorder, insomnia, anxiety disorder, seizure disorder, neuropathy, chronic pain, morbid obesity, presented to the clinic today for a follow-up visit.  Patient reports she is currently improving on the current medication regimen.  She will continue psychotherapy visits with [name].  Plan as noted below.  Plan For bipolar disorder Continue Vraylar 1.5 mg p.o. daily She will continue Tegretol 100 mg p.o. daily for now.  Her Tegretol is currently being tapered down due to side effects.  She takes it for seizures and is being managed by neurology. AIMS - 0  For anxiety disorder Cymbalta 60 mg p.o. daily. Hydroxyzine 85 mg p.o. daily as needed  For insomnia Melatonin 3-6 mg p.o. Nightly.  A new psychotherapy visits with Ms.  Miguel Dibble.  Follow-up in clinic in 2 months or sooner if needed.  More than 50 % of the time was spent for psychoeducation and supportive psychotherapy and care  coordination.  This note was generated in part or whole with voice recognition software. Voice recognition is usually quite accurate but there are transcription errors that can and very often do occur. I apologize for any typographical errors that were not detected and corrected.         Ursula Alert, MD 12/16/2017, 11:13 AM

## 2018-01-05 ENCOUNTER — Telehealth: Payer: Self-pay

## 2018-01-05 NOTE — Telephone Encounter (Signed)
pt wanted to let you know vraylar not takin anymore. sates she had a "bobal head"  and since she stop taking the medication she feels better.

## 2018-01-05 NOTE — Telephone Encounter (Signed)
Ok , will see her when she comes in.

## 2018-01-25 ENCOUNTER — Ambulatory Visit (INDEPENDENT_AMBULATORY_CARE_PROVIDER_SITE_OTHER): Payer: Medicare Other | Admitting: Neurology

## 2018-01-25 ENCOUNTER — Other Ambulatory Visit: Payer: Self-pay

## 2018-01-25 ENCOUNTER — Encounter: Payer: Self-pay | Admitting: Neurology

## 2018-01-25 VITALS — BP 164/90 | HR 112 | Resp 18 | Ht 64.5 in | Wt 269.0 lb

## 2018-01-25 DIAGNOSIS — G2581 Restless legs syndrome: Secondary | ICD-10-CM | POA: Diagnosis not present

## 2018-01-25 DIAGNOSIS — M545 Low back pain: Secondary | ICD-10-CM

## 2018-01-25 DIAGNOSIS — G8929 Other chronic pain: Secondary | ICD-10-CM

## 2018-01-25 MED ORDER — CLONAZEPAM 0.5 MG PO TABS: 0.5000 mg | ORAL_TABLET | Freq: Every day | ORAL | 3 refills | Status: DC

## 2018-01-25 NOTE — Progress Notes (Signed)
Reason for visit: Restless leg syndrome, low back pain  Rachel Luna is an 62 y.o. female  History of present illness:  Rachel Luna is a 62 year old right-handed white female with a history of obesity, bipolar disorder, chronic low back pain, and restless leg syndrome.  The patient has developed a rash on the pretibial area on the right leg, she has been to dermatology and the etiology of the rash has not been determined.  The patient has quite a bit itching, her restless legs in that leg is severe, she is on 1.5 mg of Mirapex at night, 0.75 mg in the morning.  She takes gabapentin and she is on baclofen and carbamazepine.  The patient is not resting well because of the restless legs.  She indicates that the low back pain is much better.  She denies any other significant issues.  Past Medical History:  Diagnosis Date  . Anxiety   . Arthritis   . Bipolar disorder ([place])   . chronic low back pain   . Chronic low back pain 02/18/2014  . Depression   . Diabetes mellitus without complication ([place])   . Diabetes mellitus, type II ([place])   . Fibroids   . GERD (gastroesophageal reflux disease)   . Gout   . Hyperlipidemia   . Hypertension   . Obesity     Past Surgical History:  Procedure Laterality Date  . ABDOMINAL HYSTERECTOMY    . CHOLECYSTECTOMY    . FOOT SURGERY Left   . KNEE ARTHROPLASTY    . OVARIAN CYST REMOVAL    . SPINE SURGERY      Family History  Problem Relation Age of Onset  . Diabetes Mother   . Cancer Mother        breast  . Heart disease Mother   . Mental illness Mother        bipolar disorder  . Stroke Mother   . Breast cancer Mother   . Diabetes Father   . Hypertension Father   . Diabetes Brother   . Bipolar disorder Brother   . Diabetes Brother   . Breast cancer Maternal Aunt   . Breast cancer Maternal Grandmother     Social history:  reports that she is a non-smoker but has been exposed to tobacco smoke. She has never used smokeless tobacco. She  reports that she does not drink alcohol or use drugs.    Allergies  Allergen Reactions  . Wellbutrin [Bupropion] Other (See Comments)    Irritability and Agitation  . Geodon [Ziprasidone Hcl] Other (See Comments)    Worsened Restless Leg Syndrome  . Lisinopril Cough  . Neupro [Rotigotine] Rash    Medications:  Prior to Admission medications   Medication Sig Start Date End Date Taking? Authorizing Provider  baclofen (LIORESAL) 10 MG tablet TAKE 1 TABLET BY MOUTH 3 TIMES DAILY. 12/06/17  Yes Kathrynn Ducking, MD  carbamazepine (TEGRETOL) 200 MG tablet Take 0.5 tablets (100 mg total) by mouth daily. 07/25/17  Yes Kathrynn Ducking, MD  cetirizine (ZYRTEC) 10 MG tablet Take 10 mg by mouth daily.   Yes [provider]  clobetasol ointment (TEMOVATE) 0.35 % Apply 1 application topically as directed. 07/23/17  Yes [provider]  clotrimazole (LOTRIMIN) 1 % cream Apply 1 application topically 2 (two) times daily. Use for at least 4 weeks (or for 1 week after rash has healed) 09/10/17  Yes Schaevitz, Randall An, MD  DULoxetine (CYMBALTA) 60 MG capsule Take 1  capsule (60 mg total) by mouth daily. 11/16/17  Yes Ursula Alert, MD  estradiol (ESTRACE) 0.5 MG tablet Take 1 tablet (0.5 mg total) by mouth daily. 10/10/17  Yes Shelly Bombard, MD  fluocinonide cream (LIDEX) 0.05 %  09/28/17  Yes [provider]  gabapentin (NEURONTIN) 800 MG tablet Take 1 tablet (800 mg total) by mouth 4 (four) times daily. Patient taking differently: Take 1 tablet (800MG ) by mouth every morning and daily around lunchtime then take 2 tablets (1600MG ) by mouth at bedtime 02/18/16  Yes Rainey Pines, MD  glimepiride (AMARYL) 2 MG tablet  12/14/17  Yes [provider]  hydrOXYzine (VISTARIL) 25 MG capsule Take 1 capsule (25 mg total) by mouth daily as needed for anxiety (SEVERE ANXIETY ATTACKS). 11/16/17  Yes Ursula Alert, MD  losartan-hydrochlorothiazide (HYZAAR) 100-25 MG tablet   12/14/17  Yes [provider]  meloxicam (MOBIC) 15 MG tablet Take 15 mg daily by mouth.  07/04/15  Yes [provider]  pramipexole (MIRAPEX) 0.75 MG tablet TAKE 1 TABLET BY MOUTH ONCE EVERY MORNING AND 2 TABLETS EVERY EVENING. 10/03/17  Yes Kathrynn Ducking, MD  rosuvastatin (CRESTOR) 40 MG tablet Take 1 tablet by mouth daily. 03/23/16  Yes [provider]  triamcinolone (KENALOG) 0.025 % ointment  09/20/17  Yes [provider]  furosemide (LASIX) 20 MG tablet Take 1 tablet (20 mg total) by mouth daily for 5 days. 07/29/17 08/03/17  Earleen Newport, MD    ROS:  Out of a complete 14 system review of symptoms, the patient complains only of the following symptoms, and all other reviewed systems are negative.  Restless legs Skin rash  Blood pressure (!) 164/90, pulse (!) 112, resp. rate 18, height 5' 4.5" (1.638 m), weight 269 lb (122 kg).  Physical Exam  General: The patient is alert and cooperative at the time of the examination.  The patient is markedly obese.  Neuromuscular: The patient is able to flex the low back to about 90 degrees.  Skin: No significant peripheral edema is noted.  There is a slightly raised, red rash in the right lower leg in the pretibial area.   Neurologic Exam  Mental status: The patient is alert and oriented x 3 at the time of the examination. The patient has apparent normal recent and remote memory, with an apparently normal attention span and concentration ability.   Cranial nerves: Facial symmetry is present. Speech is normal, no aphasia or dysarthria is noted. Extraocular movements are full. Visual fields are full.  Motor: The patient has good strength in all 4 extremities.  Sensory examination: Soft touch sensation is symmetric on the face, arms, and legs.  Coordination: The patient has good finger-nose-finger and heel-to-shin bilaterally.  Gait and station: The patient has a normal gait. Tandem gait is normal.  Romberg is negative. No drift is seen.  Reflexes: Deep tendon reflexes are symmetric, but are depressed.   Assessment/Plan:  1.  Chronic low back pain  2.  Restless leg syndrome, right greater than left  The patient is not doing well for the restless legs, we will add clonazepam 0.5 mg at night for this issue.  The patient will remain on Mirapex and gabapentin.  She will follow-up in 6 months.  Jill Alexanders MD 01/25/2018 3:44 PM  [facility] [address] [place], [place] [address]  Phone [phone] Fax [phone]

## 2018-02-09 ENCOUNTER — Ambulatory Visit: Payer: Medicare Other | Admitting: Psychiatry

## 2018-02-14 ENCOUNTER — Ambulatory Visit: Payer: Medicare Other | Admitting: Psychiatry

## 2018-02-21 ENCOUNTER — Ambulatory Visit (INDEPENDENT_AMBULATORY_CARE_PROVIDER_SITE_OTHER): Payer: Medicare Other | Admitting: Psychiatry

## 2018-02-21 ENCOUNTER — Encounter: Payer: Self-pay | Admitting: Psychiatry

## 2018-02-21 ENCOUNTER — Other Ambulatory Visit: Payer: Self-pay

## 2018-02-21 VITALS — BP 143/82 | HR 94 | Temp 98.0°F | Wt 271.6 lb

## 2018-02-21 DIAGNOSIS — F411 Generalized anxiety disorder: Secondary | ICD-10-CM | POA: Diagnosis not present

## 2018-02-21 DIAGNOSIS — F5105 Insomnia due to other mental disorder: Secondary | ICD-10-CM | POA: Diagnosis not present

## 2018-02-21 DIAGNOSIS — F3160 Bipolar disorder, current episode mixed, unspecified: Secondary | ICD-10-CM | POA: Diagnosis not present

## 2018-02-21 MED ORDER — LURASIDONE HCL 40 MG PO TABS: 40.0000 mg | ORAL_TABLET | Freq: Every day | ORAL | 1 refills | Status: DC

## 2018-02-21 NOTE — Progress Notes (Signed)
[place] MD  OP Progress Note  02/21/2018 5:43 PM MARYKAY MCCLEOD  MRN:  [id number]  Chief Complaint: ' I am here for follow up.' Chief Complaint    Follow-up; Medication Refill     HPI: Camillia is a 62 year old Caucasian female, divorced, on disability, lives in [place], has a history of bipolar disorder, anxiety disorder, insomnia, benzodiazepine dependence in remission, seizure disorder, morbid obesity, chronic back pain, neuropathy, presented to the clinic today for a follow-up visit.  Patient today reports she had to stop taking Vraylar due to side effects.  She had abnormal movements of her head.  She reports when she stopped taking the medication her side effects resolved.  She however continues to have restlessness, racing thoughts, anxiety symptoms.  She reports she is unable to concentrate .  Discussed adding another mood stabilizer like Latuda.  She agrees with plan.  Patient reports sleep is improved now that she is on Klonopin for restless leg syndrome.  It was prescribed by her other provider who is also giving her Mirapex for RLS.  She reports the restless leg syndrome as improved at this time.  Patient denies any suicidality.  Patient denies any perceptual disturbances.  Patient denies any homicidality.  Continues to be in psychotherapy with [name] which is going well.  Patient denies any other concerns today.    Visit Diagnosis:    ICD-10-CM   1. Bipolar I disorder, most recent episode mixed ([place]) F31.60   2. GAD (generalized anxiety disorder) F41.1   3. Insomnia due to mental disorder F51.05     Past Psychiatric History: Reviewed past psychiatric history from my progress note on 10/19/2017.  Trials of Abilify, Klonopin, Elavil, Cymbalta, Tegretol, Effexor, trazodone.     Past Medical History:  Past Medical History:  Diagnosis Date  . Anxiety   . Arthritis   . Bipolar disorder ([place])   . chronic low back pain   . Chronic low back pain 02/18/2014  .  Depression   . Diabetes mellitus without complication ([place])   . Diabetes mellitus, type II ([place])   . Fibroids   . GERD (gastroesophageal reflux disease)   . Gout   . Hyperlipidemia   . Hypertension   . Obesity     Past Surgical History:  Procedure Laterality Date  . ABDOMINAL HYSTERECTOMY    . CHOLECYSTECTOMY    . FOOT SURGERY Left   . KNEE ARTHROPLASTY    . OVARIAN CYST REMOVAL    . SPINE SURGERY      Family Psychiatric History: Reviewed family psychiatric history from my progress note on 10/19/2017  Family History:  Family History  Problem Relation Age of Onset  . Diabetes Mother   . Cancer Mother        breast  . Heart disease Mother   . Mental illness Mother        bipolar disorder  . Stroke Mother   . Breast cancer Mother   . Diabetes Father   . Hypertension Father   . Diabetes Brother   . Bipolar disorder Brother   . Diabetes Brother   . Breast cancer Maternal Aunt   . Breast cancer Maternal Grandmother     Social History: Reviewed social history from my progress note on 10/19/2017. Social History   Socioeconomic History  . Marital status: Divorced    Spouse name: Not on file  . Number of children: 0  . Years of education: 10  . Highest education level: 10th grade  Occupational History    Employer: UNEMPLOYED  Social Needs  . Financial resource strain: Very hard  . Food insecurity:    Worry: Often true    Inability: Often true  . Transportation needs:    Medical: No    Non-medical: No  Tobacco Use  . Smoking status: Passive Smoke Exposure - Never Smoker  . Smokeless tobacco: Never Used  . Tobacco comment: room mate smokes  Substance and Sexual Activity  . Alcohol use: No    Alcohol/week: 0.0 standard drinks  . Drug use: No  . Sexual activity: Not Currently  Lifestyle  . Physical activity:    Days per week: 0 days    Minutes per session: 0 min  . Stress: Very much  Relationships  . Social connections:    Talks on phone: Three times a  week    Gets together: Once a week    Attends religious service: 1 to 4 times per year    Active member of club or organization: No    Attends meetings of clubs or organizations: Never    Relationship status: Divorced  Other Topics Concern  . Not on file  Social History Narrative   Patient is divorced and lives with a roommate.   Patient is on disability.   Patient has a 10th grade education.   Patient is right-handed.   Patient drinks 6 sodas daily.          Allergies:  Allergies  Allergen Reactions  . Wellbutrin [Bupropion] Other (See Comments)    Irritability and Agitation  . Geodon [Ziprasidone Hcl] Other (See Comments)    Worsened Restless Leg Syndrome  . Lisinopril Cough  . Neupro [Rotigotine] Rash    Metabolic Disorder Labs: Lab Results  Component Value Date   HGBA1C 6.6 (H) 07/11/2014   No results found for: PROLACTIN Lab Results  Component Value Date   CHOL 220 (H) 03/25/2014   TRIG 204 (H) 03/25/2014   HDL 75 (H) 03/25/2014   VLDL 41 (H) 03/25/2014   LDLCALC 104 (H) 03/25/2014   No results found for: TSH  Therapeutic Level Labs: No results found for: LITHIUM No results found for: VALPROATE No components found for:  CBMZ  Current Medications: Current Outpatient Medications  Medication Sig Dispense Refill  . ACCU-CHEK AVIVA PLUS test strip     . baclofen (LIORESAL) 10 MG tablet TAKE 1 TABLET BY MOUTH 3 TIMES DAILY. 270 each 1  . carbamazepine (TEGRETOL) 200 MG tablet Take 0.5 tablets (100 mg total) by mouth daily. 45 tablet 3  . cetirizine (ZYRTEC) 10 MG tablet Take 10 mg by mouth daily.    . clobetasol ointment (TEMOVATE) 7.42 % Apply 1 application topically as directed.    . clonazePAM (KLONOPIN) 0.5 MG tablet Take 1 tablet (0.5 mg total) by mouth at bedtime. 30 tablet 3  . clotrimazole (LOTRIMIN) 1 % cream Apply 1 application topically 2 (two) times daily. Use for at least 4 weeks (or for 1 week after rash has healed) 30 g 1  . DULoxetine  (CYMBALTA) 60 MG capsule Take 1 capsule (60 mg total) by mouth daily. 90 capsule 1  . estradiol (ESTRACE) 0.5 MG tablet Take 1 tablet (0.5 mg total) by mouth daily. 30 tablet 11  . fluocinonide cream (LIDEX) 0.05 %     . gabapentin (NEURONTIN) 800 MG tablet Take 1 tablet (800 mg total) by mouth 4 (four) times daily. (Patient taking differently: Take 1 tablet (800MG ) by mouth every morning and  daily around lunchtime then take 2 tablets (1600MG ) by mouth at bedtime) 120 tablet 6  . glimepiride (AMARYL) 2 MG tablet     . hydrOXYzine (VISTARIL) 25 MG capsule Take 1 capsule (25 mg total) by mouth daily as needed for anxiety (SEVERE ANXIETY ATTACKS). 90 capsule 1  . losartan-hydrochlorothiazide (HYZAAR) 100-25 MG tablet     . lurasidone (LATUDA) 40 MG TABS tablet Take 1 tablet (40 mg total) by mouth daily with breakfast. 30 tablet 1  . meloxicam (MOBIC) 15 MG tablet Take 15 mg daily by mouth.     . pramipexole (MIRAPEX) 0.75 MG tablet TAKE 1 TABLET BY MOUTH ONCE EVERY MORNING AND 2 TABLETS EVERY EVENING. 270 tablet 2  . rosuvastatin (CRESTOR) 40 MG tablet Take 1 tablet by mouth daily.    Marland Kitchen triamcinolone (KENALOG) 0.025 % ointment     . furosemide (LASIX) 20 MG tablet Take 1 tablet (20 mg total) by mouth daily for 5 days. 5 tablet 0   No current facility-administered medications for this visit.      Musculoskeletal: Strength & Muscle Tone: within normal limits Gait & Station: normal Patient leans: N/A  Psychiatric Specialty Exam: Review of Systems  Psychiatric/Behavioral: The patient is nervous/anxious.   All other systems reviewed and are negative.   Blood pressure (!) 143/82, pulse 94, temperature 98 F (36.7 C), temperature source Oral, weight 271 lb 9.6 oz (123.2 kg).Body mass index is 45.9 kg/m.  General Appearance: Casual  Eye Contact:  Fair  Speech:  Normal Rate  Volume:  Normal  Mood:  Anxious  Affect:  Congruent  Thought Process:  Goal Directed and Descriptions of Associations:  Intact  Orientation:  Full (Time, Place, and Person)  Thought Content: Logical   Suicidal Thoughts:  No  Homicidal Thoughts:  No  Memory:  Immediate;   Fair Recent;   Fair Remote;   Fair  Judgement:  Fair  Insight:  Fair  Psychomotor Activity:  Normal  Concentration:  Concentration: Fair and Attention Span: Fair  Recall:  [employer] of Knowledge: Fair  Language: Fair  Akathisia:  No  Handed:  Right  AIMS (if indicated):0  Assets:  Communication Skills Desire for Improvement Social Support  ADL's:  Intact  Cognition: WNL  Sleep:  improved   Screenings:   Assessment and Plan: Edita is a 62 year old Caucasian female, divorced, on disability, lives in [place] with a roommate, has a history of bipolar disorder, insomnia, anxiety disorder, seizure disorder, neuropathy, chronic pain, morbid obesity, presented to clinic today for a follow-up visit.  Patient  is in psychotherapy with [name].  Patient continues to have mood lability and hence will continue to make medication changes.  Plan as noted below.  Plan For bipolar disorder Discontinue Vraylar side effects and noncompliance. Start Latuda 40 mg p.o. daily with breakfast. Continue Tegretol 100 mg p.o. daily-being prescribed per neurology for seizures-being currently tapered off due to side effects.  Anxiety disorder Cymbalta 60 mg p.o. daily Hydroxyzine 25 mg p.o. daily as needed  For insomnia Melatonin 3 to 6 mg p.o. nightly She also has restless leg syndrome and is currently on Klonopin and Mirapex per her provider.  Continue psychotherapy with [name].  Follow-up in clinic in 2 weeks or sooner if needed.   More than 50 % of the time was spent for psychoeducation and supportive psychotherapy and care coordination.  This note was generated in part or whole with voice recognition software. Voice recognition is usually  quite accurate but there are transcription errors that can and very often do  occur. I apologize for any typographical errors that were not detected and corrected.            Ursula Alert, MD 02/21/2018, 5:43 PM

## 2018-02-21 NOTE — Patient Instructions (Signed)
Lurasidone oral tablet What is this medicine? LURASIDONE (loo RAS i done) is an antipsychotic. It is used to treat schizophrenia or bipolar disorder, also known as manic-depression. This medicine may be used for other purposes; ask your health care provider or pharmacist if you have questions. COMMON BRAND NAME(S): Latuda What should I tell my health care provider before I take this medicine? They need to know if you have any of these conditions: -dementia -diabetes -difficulty swallowing -heart disease -history of breast cancer -kidney disease -liver disease -low blood counts, like low white cell, platelet, or red cell counts -low blood pressure -Parkinson's disease -seizures -suicidal thoughts, plans, or attempt; a previous suicide attempt by you or a family member -an unusual or allergic reaction to lurasidone, other medicines, foods, dyes, or preservatives -pregnant or trying to get pregnant -breast-feeding How should I use this medicine? Take this medicine by mouth with a glass of water. Follow the directions on the prescription label. Take this medicine with food. Take your medicine at regular intervals. Do not take it more often than directed. Do not stop taking except on your doctor's advice. Talk to your pediatrician regarding the use of this medicine in children. While this drug may be prescribed for children as young as 13 years for selected conditions, precautions do apply. Overdosage: If you think you have taken too much of this medicine contact a poison control center or emergency room at once. NOTE: This medicine is only for you. Do not share this medicine with others. What if I miss a dose? If you miss a dose, take it as soon as you can. If it is almost time for your next dose, take only that dose. Do not take double or extra doses. What may interact with this medicine? Do not take this medicine with any of the following medications: -dalfopristin;  quinupristin -delavirdine -indinavir -isoniazid -itraconazole -ketoconazole -lumacaftor; ivacaftor -metoclopramide -rifampin -ritonavir -tipranavir This medicine may also interact with the following medications: -alcohol -certain medicines for anxiety or sleep -diltiazem -digoxin -lithium -medicines for blood pressure This list may not describe all possible interactions. Give your health care provider a list of all the medicines, herbs, non-prescription drugs, or dietary supplements you use. Also tell them if you smoke, drink alcohol, or use illegal drugs. Some items may interact with your medicine. What should I watch for while using this medicine? Visit your doctor or health care professional for regular checks on your progress. It may be several weeks before you see the full effects of this medicine. Notify your doctor or health care professional if your symptoms get worse, if you have new symptoms, if you are having an unusual effect from this medicine, or if you feel out of control, very discouraged or think you might harm yourself or others. Do not suddenly stop taking this medicine. You may need to gradually reduce the dose. Ask your doctor or health care professional for advice. You may get dizzy or drowsy. Do not drive, use machinery, or do anything that needs mental alertness until you know how this medicine affects you. Do not stand or sit up quickly, especially if you are an older patient. This reduces the risk of dizzy or fainting spells. Alcohol can increase dizziness and drowsiness. Avoid alcoholic drinks. This medicine may cause dry eyes and blurred vision. If you wear contact lenses you may feel some discomfort. Lubricating drops may help. See your eye doctor if the problem does not go away or is severe. This medicine can   reduce the response of your body to heat or cold. Dress warm in cold weather and stay hydrated in hot weather. If possible, avoid extreme temperatures like  saunas, hot tubs, very hot or cold showers, or activities that can cause dehydration such as vigorous exercise. What side effects may I notice from receiving this medicine? Side effects that you should report to your doctor or health care professional as soon as possible: -allergic reactions like skin rash, itching or hives, swelling of the face, lips, or tongue -confusion -feeling faint or lightheaded, falls -fever or chills, sore throat -high fever -inability to control muscle movements in the face, mouth, hands, arms, or legs -increased hunger or thirst -increased urination -missed menstrual periods -restlessness or need to keep moving -seizures -stiffness, spasms, trembling -suicidal thoughts or other mood changes -unusually weak or tired Side effects that usually do not require medical attention (report to your doctor or health care professional if they continue or are bothersome): -blurred vision -change in sex drive or performance -diarrhea -drowsiness -nausea, vomiting -weight gain This list may not describe all possible side effects. Call your doctor for medical advice about side effects. You may report side effects to FDA at 1-800-FDA-1088. Where should I keep my medicine? Keep out of the reach of children. Store at room temperature between 15 and 30 degrees C (59 and 86 degrees F). Throw away any unused medicine after the expiration date. NOTE: This sheet is a summary. It may not cover all possible information. If you have questions about this medicine, talk to your doctor, pharmacist, or health care provider.  2018 Elsevier/Gold Standard (2015-04-09 10:23:22)  

## 2018-03-02 ENCOUNTER — Other Ambulatory Visit: Payer: Self-pay | Admitting: Neurology

## 2018-03-07 ENCOUNTER — Encounter: Payer: Self-pay | Admitting: Psychiatry

## 2018-03-07 ENCOUNTER — Ambulatory Visit (INDEPENDENT_AMBULATORY_CARE_PROVIDER_SITE_OTHER): Payer: Medicare Other | Admitting: Psychiatry

## 2018-03-07 VITALS — BP 124/78 | HR 80 | Ht 64.5 in | Wt 276.0 lb

## 2018-03-07 DIAGNOSIS — F411 Generalized anxiety disorder: Secondary | ICD-10-CM

## 2018-03-07 DIAGNOSIS — F5105 Insomnia due to other mental disorder: Secondary | ICD-10-CM | POA: Diagnosis not present

## 2018-03-07 DIAGNOSIS — F3160 Bipolar disorder, current episode mixed, unspecified: Secondary | ICD-10-CM | POA: Diagnosis not present

## 2018-03-07 MED ORDER — PALIPERIDONE ER 3 MG PO TB24: 3.0000 mg | ORAL_TABLET | Freq: Every day | ORAL | 2 refills | Status: DC

## 2018-03-07 NOTE — Progress Notes (Signed)
[facility] MD OP Progress Note  03/07/2018 12:48 PM Rachel Luna  MRN:  [id number]  Chief Complaint:  Chief Complaint    Follow-up    ' I am here for follow up."   HPI: Rachel Luna is a 62 year old Caucasian female, divorced, on disability, lives in [place], has a history of bipolar disorder, anxiety disorder, insomnia, seizure disorder, morbid obesity, chronic back pain, neuropathy, presented to the clinic today for a follow-up visit.  Patient today returns to clinic reporting that she developed side effects to the [place].  She reports the [place] made her feel weird.  She had stopped taking it.  She continues to struggle with some racing thoughts, anxiety symptoms and restlessness.  She also has difficulty with concentration and focus.  She is currently in pain due to knee joint problems.  She reports she gets an injection every few months which helps to some extent.  She may need knee replacement eventually.  Patient reports sleep is fair.  She continues to use Klonopin and Mirapex for restless leg syndrome which helps.  Patient is compliant with her other medications.  Discussed adding  Invega.  She agrees with plan.  She denies any suicidality, homicidality or perceptual disturbances.  Continues to be in psychotherapy with [name] which is going well. Visit Diagnosis:    ICD-10-CM   1. Bipolar I disorder, most recent episode mixed ([place]) F31.60   2. GAD (generalized anxiety disorder) F41.1   3. Insomnia due to mental disorder F51.05     Past Psychiatric History: Reviewed past psychiatric history from my progress note on 10/19/2017.  Trials of Abilify, Klonopin, Elavil, Cymbalta, Tegretol, Effexor, trazodone.  Past Medical History:  Past Medical History:  Diagnosis Date  . Anxiety   . Arthritis   . Bipolar disorder ([place])   . chronic low back pain   . Chronic low back pain 02/18/2014  . Depression   . Diabetes mellitus without complication ([place])   . Diabetes mellitus, type  II ([place])   . Fibroids   . GERD (gastroesophageal reflux disease)   . Gout   . Hyperlipidemia   . Hypertension   . Obesity     Past Surgical History:  Procedure Laterality Date  . ABDOMINAL HYSTERECTOMY    . CHOLECYSTECTOMY    . FOOT SURGERY Left   . KNEE ARTHROPLASTY    . OVARIAN CYST REMOVAL    . SPINE SURGERY      Family Psychiatric History: I have reviewed family psychiatric history from my progress note on 10/19/2017  Family History:  Family History  Problem Relation Age of Onset  . Diabetes Mother   . Cancer Mother        breast  . Heart disease Mother   . Mental illness Mother        bipolar disorder  . Stroke Mother   . Breast cancer Mother   . Diabetes Father   . Hypertension Father   . Diabetes Brother   . Bipolar disorder Brother   . Diabetes Brother   . Breast cancer Maternal Aunt   . Breast cancer Maternal Grandmother     Social History: I have reviewed social history from my progress note on 10/19/2017. Social History   Socioeconomic History  . Marital status: Divorced    Spouse name: Not on file  . Number of children: 0  . Years of education: 10  . Highest education level: 10th grade  Occupational History    Employer: UNEMPLOYED  Social  Needs  . Financial resource strain: Very hard  . Food insecurity:    Worry: Often true    Inability: Often true  . Transportation needs:    Medical: No    Non-medical: No  Tobacco Use  . Smoking status: Passive Smoke Exposure - Never Smoker  . Smokeless tobacco: Never Used  . Tobacco comment: room mate smokes  Substance and Sexual Activity  . Alcohol use: No    Alcohol/week: 0.0 standard drinks  . Drug use: No  . Sexual activity: Not Currently  Lifestyle  . Physical activity:    Days per week: 0 days    Minutes per session: 0 min  . Stress: Very much  Relationships  . Social connections:    Talks on phone: Three times a week    Gets together: Once a week    Attends religious service: 1 to 4 times  per year    Active member of club or organization: No    Attends meetings of clubs or organizations: Never    Relationship status: Divorced  Other Topics Concern  . Not on file  Social History Narrative   Patient is divorced and lives with a roommate.   Patient is on disability.   Patient has a 10th grade education.   Patient is right-handed.   Patient drinks 6 sodas daily.          Allergies:  Allergies  Allergen Reactions  . Wellbutrin [Bupropion] Other (See Comments)    Irritability and Agitation  . Geodon [Ziprasidone Hcl] Other (See Comments)    Worsened Restless Leg Syndrome  . Lisinopril Cough  . Neupro [Rotigotine] Rash    Metabolic Disorder Labs: Lab Results  Component Value Date   HGBA1C 6.6 (H) 07/11/2014   No results found for: PROLACTIN Lab Results  Component Value Date   CHOL 220 (H) 03/25/2014   TRIG 204 (H) 03/25/2014   HDL 75 (H) 03/25/2014   VLDL 41 (H) 03/25/2014   LDLCALC 104 (H) 03/25/2014   No results found for: TSH  Therapeutic Level Labs: No results found for: LITHIUM No results found for: VALPROATE No components found for:  CBMZ  Current Medications: Current Outpatient Medications  Medication Sig Dispense Refill  . ACCU-CHEK AVIVA PLUS test strip     . baclofen (LIORESAL) 10 MG tablet TAKE 1 TABLET BY MOUTH 3 TIMES DAILY. 270 each 1  . carbamazepine (TEGRETOL) 200 MG tablet Take 0.5 tablets (100 mg total) by mouth daily. 45 tablet 3  . cetirizine (ZYRTEC) 10 MG tablet Take 10 mg by mouth daily.    . clobetasol ointment (TEMOVATE) 6.73 % Apply 1 application topically as directed.    . clonazePAM (KLONOPIN) 0.5 MG tablet Take 1 tablet (0.5 mg total) by mouth at bedtime. 30 tablet 3  . clotrimazole (LOTRIMIN) 1 % cream Apply 1 application topically 2 (two) times daily. Use for at least 4 weeks (or for 1 week after rash has healed) 30 g 1  . DULoxetine (CYMBALTA) 60 MG capsule Take 1 capsule (60 mg total) by mouth daily. 90 capsule 1  .  estradiol (ESTRACE) 0.5 MG tablet Take 1 tablet (0.5 mg total) by mouth daily. 30 tablet 11  . fluocinonide cream (LIDEX) 0.05 %     . furosemide (LASIX) 20 MG tablet Take 1 tablet (20 mg total) by mouth daily for 5 days. 5 tablet 0  . gabapentin (NEURONTIN) 800 MG tablet Take 1 tablet (800 mg total) by mouth 4 (four) times  daily. (Patient taking differently: Take 1 tablet (800MG ) by mouth every morning and daily around lunchtime then take 2 tablets (1600MG ) by mouth at bedtime) 120 tablet 6  . glimepiride (AMARYL) 2 MG tablet     . hydrOXYzine (VISTARIL) 25 MG capsule Take 1 capsule (25 mg total) by mouth daily as needed for anxiety (SEVERE ANXIETY ATTACKS). 90 capsule 1  . losartan-hydrochlorothiazide (HYZAAR) 100-25 MG tablet     . meloxicam (MOBIC) 15 MG tablet Take 15 mg daily by mouth.     . paliperidone (INVEGA) 3 MG 24 hr tablet Take 1 tablet (3 mg total) by mouth daily. 30 tablet 2  . pramipexole (MIRAPEX) 0.75 MG tablet TAKE 1 TABLET BY MOUTH ONCE EVERY MORNING AND 2 TABLETS EVERY EVENING. 270 tablet 2  . rosuvastatin (CRESTOR) 40 MG tablet Take 1 tablet by mouth daily.    Marland Kitchen triamcinolone (KENALOG) 0.025 % ointment      No current facility-administered medications for this visit.      Musculoskeletal: Strength & Muscle Tone: within normal limits Gait & Station: normal Patient leans: N/A  Psychiatric Specialty Exam: Review of Systems  Psychiatric/Behavioral: The patient is nervous/anxious.   All other systems reviewed and are negative.   Blood pressure 124/78, pulse 80, height 5' 4.5" (1.638 m), weight 276 lb (125.2 kg), SpO2 94 %.Body mass index is 46.64 kg/m.  General Appearance: Casual  Eye Contact:  Fair  Speech:  Clear and Coherent  Volume:  Normal  Mood:  Anxious  Affect:  Appropriate  Thought Process:  Goal Directed and Descriptions of Associations: Intact  Orientation:  Full (Time, Place, and Person)  Thought Content: Logical   Suicidal Thoughts:  No  Homicidal  Thoughts:  No  Memory:  Immediate;   Fair Recent;   Fair Remote;   Fair  Judgement:  Fair  Insight:  Fair  Psychomotor Activity:  Normal  Concentration:  Concentration: Fair and Attention Span: Fair  Recall:  [employer] of Knowledge: Fair  Language: Fair  Akathisia:  No  Handed:  Right  AIMS (if indicated):0  Assets:  Communication Skills Desire for Improvement Social Support  ADL's:  Intact  Cognition: WNL  Sleep:  Fair   Screenings:   Assessment and Plan: Larena is a 62 year old Caucasian female, divorced, on disability, lives in [place] with a roommate, has a history of bipolar disorder, insomnia, anxiety disorder, seizure disorder, neuropathy, chronic pain, morbid obesity, presented to the clinic today for a follow-up visit.  Patient did not tolerate the [place] well.  Continues to struggle with mood lability.  We will continue to make medication readjustment.  She will continue psychotherapy sessions.  Plan For bipolar disorder Discontinue Latuda for noncompliance and side effects. Start Invega 3 mg p.o. daily. Continue Tegretol 100 mg p.o. daily-being prescribed per neurology for seizures-currently being tapered off due to side effects.  For anxiety disorder Cymbalta 60 mg p.o. daily Hydroxyzine 25 mg p.o. daily as needed  For insomnia Melatonin 3 to 6 mg p.o. nightly She also has restless leg syndrome and is currently on Klonopin and Mirapex per her provider.  She will continue psychotherapy with [name].  Follow-up in clinic in 10 days or sooner if needed.  More than 50 % of the time was spent for psychoeducation and supportive psychotherapy and care coordination.  This note was generated in part or whole with voice recognition software. Voice recognition is usually quite accurate but there are transcription errors that can and very  often do occur. I apologize for any typographical errors that were not detected and corrected.       Ursula Alert,  MD 03/07/2018, 12:48 PM

## 2018-03-07 NOTE — Patient Instructions (Signed)
Paliperidone oral tablets, extend release What is this medicine? PALIPERIDONE (pal ee PER i done) is used to treat schizophrenia and schizoaffective disorder. This medicine may be used for other purposes; ask your health care provider or pharmacist if you have questions. COMMON BRAND NAME(S): Invega What should I tell my health care provider before I take this medicine? They need to know if you have any of these conditions: -chronic constipation or diarrhea -dementia -diabetes or family history of diabetes -history of stroke -irregular heartbeat or low blood pressure -kidney disease -liver disease -stomach problems like adhesions, bowel disease, short gut, trouble swallowing -an unusual or allergic reaction to paliperidone, risperidone, other medicines, foods, dyes, or preservatives -pregnant or trying to get pregnant -breast-feeding How should I use this medicine? Take this medicine by mouth with a glass of water. Follow the directions on the prescription label. Do not chew, crush, or cut the tablets. You can take this medicine with or without food. Take your doses at regular intervals. Do not take your medicine more often than directed. Do not stop taking except on your prescriber's advice. A special MedGuide will be given to you by the pharmacist with each prescription and refill. Be sure to read this information carefully each time. Talk to your pediatrician regarding the use of this medicine in children. While this drug may be prescribed for children as young as 53 years old for selected conditions, precautions do apply. Overdosage: If you think you have taken too much of this medicine contact a poison control center or emergency room at once. NOTE: This medicine is only for you. Do not share this medicine with others. What if I miss a dose? If you miss a dose, take it as soon as you can. If it is almost time for your next dose, take only that dose. Do not take double or extra  doses. What may interact with this medicine? Do not take this medicine with any of the following medications: -cisapride -dofetilide -dronedarone -fluconazole -pimozide -thioridazine This medicine may also interact with the following medications: -alcohol -carbamazepine -certain medicines for anxiety, depression, or psychotic disturbances -certain medicines for blood pressure, heart disease, irregular heart beat -certain medications for Parkinson's disease like levodopa, bromocriptine, ropinirole, and pramipexole -certain medicines for sleep -narcotic pain medicines -other medicines that prolong the QT interval (cause an abnormal heart rhythm) -rifampin -St. John's Wort This list may not describe all possible interactions. Give your health care provider a list of all the medicines, herbs, non-prescription drugs, or dietary supplements you use. Also tell them if you smoke, drink alcohol, or use illegal drugs. Some items may interact with your medicine. What should I watch for while using this medicine? Visit your doctor or health care professional for regular checks on your progress. It may be several weeks before you see the full effects. Do not suddenly stop taking this medicine. You may need to gradually reduce the dose. Only stop taking this medicine on the advice of your doctor or health care professional. Dennis Bast may get drowsy or dizzy. Do not drive, use machinery, or do anything that needs mental alertness until you know how this medicine affects you. Do not stand or sit up quickly, especially if you are an older patient. This reduces the risk of dizzy or fainting spells. Alcohol may interfere with the effect of this medicine. Avoid alcoholic drinks. This medicine can reduce the response of your body to heat or cold. Dress warm in cold weather and stay hydrated in hot  weather. If possible, avoid extreme temperatures like saunas, hot tubs, very hot or cold showers, or activities that can  cause dehydration such as vigorous exercise. The tablet shell for some brands of this medicine does not dissolve. This is normal. The tablet shell may appear whole in the stool. This is not a cause for concern. What side effects may I notice from receiving this medicine? Side effects that you should report to your doctor or health care professional as soon as possible: -allergic reactions like skin rash, itching or hives, swelling of the face, lips, or tongue -change in blood sugar -changes in vision -confusion -dark urine -fast or irregular heartbeat -feeling faint or lightheaded, falls -fever or chills, sore throat -increased thirst or hunger -inner restlessness, unable to keep still -men: prolonged or painful erection -muscle pain, stiffness -trouble passing urine or change in the amount of urine -unusual movements, spasms, tremor -unusual decrease in sweating Side effects that usually do not require medical attention (report to your doctor or health care professional if they continue or are bothersome): -change in sex drive or performance -cough -drowsiness -dry mouth -headache -menstrual irregularity -more or less saliva than normal -nausea -stomach upset -weight gain This list may not describe all possible side effects. Call your doctor for medical advice about side effects. You may report side effects to FDA at 1-800-FDA-1088. Where should I keep my medicine? Keep out of the reach of children. Store at room temperature between 15 and 30 degrees C (59 and 86 degrees F). Protect from moisture. Throw away any unused medicine after the expiration date. NOTE: This sheet is a summary. It may not cover all possible information. If you have questions about this medicine, talk to your doctor, pharmacist, or health care provider.  2019 [employer]vier/Gold Standard (2017-03-30 18:53:10)

## 2018-03-15 ENCOUNTER — Ambulatory Visit: Payer: Medicare Other | Admitting: Psychiatry

## 2018-03-22 ENCOUNTER — Ambulatory Visit (INDEPENDENT_AMBULATORY_CARE_PROVIDER_SITE_OTHER): Payer: Medicare Other | Admitting: Psychiatry

## 2018-03-22 ENCOUNTER — Encounter: Payer: Self-pay | Admitting: Psychiatry

## 2018-03-22 ENCOUNTER — Other Ambulatory Visit: Payer: Self-pay

## 2018-03-22 VITALS — BP 151/73 | HR 94 | Temp 97.8°F | Wt 275.0 lb

## 2018-03-22 DIAGNOSIS — F3160 Bipolar disorder, current episode mixed, unspecified: Secondary | ICD-10-CM

## 2018-03-22 DIAGNOSIS — F5105 Insomnia due to other mental disorder: Secondary | ICD-10-CM

## 2018-03-22 DIAGNOSIS — R030 Elevated blood-pressure reading, without diagnosis of hypertension: Secondary | ICD-10-CM | POA: Diagnosis not present

## 2018-03-22 DIAGNOSIS — F411 Generalized anxiety disorder: Secondary | ICD-10-CM

## 2018-03-22 MED ORDER — PALIPERIDONE ER 6 MG PO TB24: 6.0000 mg | ORAL_TABLET | Freq: Every day | ORAL | 0 refills | Status: DC

## 2018-03-22 MED ORDER — HYDROXYZINE PAMOATE 25 MG PO CAPS: 25.0000 mg | ORAL_CAPSULE | Freq: Three times a day (TID) | ORAL | 1 refills | Status: DC | PRN

## 2018-03-22 NOTE — Progress Notes (Signed)
[facility] MD OP Progress Note  03/22/2018 5:16 PM Rachel Luna  MRN:  [id number]  Chief Complaint: ' I am here for follow up.' Chief Complaint    Follow-up; Medication Refill     HPI: Rachel Luna is a 63 year old Caucasian female, divorced, disability, lives in [place], has a history of bipolar disorder, anxiety disorder, insomnia, seizure disorder, morbid obesity, chronic back pain, neuropathy, presented to the clinic today for a follow-up visit.  Patient today reports she has been struggling with bronchiolitis again.  She reports she is currently on prednisone and amoxicillin as well as cough medications.  She reports because of her respiratory infection her sleep is affected.  She also reports mood lability and irritability.  She has been taking the Invega 3 mg.  She does not know if her irritability is a side effect of the Invega or otherwise.  Patient denies any suicidality.  Patient denies any perceptual disturbances.  Patient reports she continues to struggle with anxiety symptoms.  She would like to take something as needed for her anxiety.  She reports hydroxyzine has helped her previously.  Discussed increasing her dosage of hydroxyzine.  Patient continues to be in psychotherapy with [name] which is going well. Visit Diagnosis:    ICD-10-CM   1. Bipolar I disorder, most recent episode mixed (HCC) F31.60 paliperidone (INVEGA) 6 MG 24 hr tablet    hydrOXYzine (VISTARIL) 25 MG capsule  2. GAD (generalized anxiety disorder) F41.1 paliperidone (INVEGA) 6 MG 24 hr tablet    hydrOXYzine (VISTARIL) 25 MG capsule  3. Insomnia due to mental disorder F51.05   4. Elevated blood pressure reading R03.0     Past Psychiatric History: I have reviewed past psychiatric history from my progress note on 10/19/2017.  Trials of Abilify, Klonopin, Elavil, Cymbalta, Tegretol, Effexor, trazodone  Past Medical History:  Past Medical History:  Diagnosis Date  . Anxiety   . Arthritis   . Bipolar  disorder ([place])   . chronic low back pain   . Chronic low back pain 02/18/2014  . Depression   . Diabetes mellitus without complication ([place])   . Diabetes mellitus, type II ([place])   . Fibroids   . GERD (gastroesophageal reflux disease)   . Gout   . Hyperlipidemia   . Hypertension   . Obesity     Past Surgical History:  Procedure Laterality Date  . ABDOMINAL HYSTERECTOMY    . CHOLECYSTECTOMY    . FOOT SURGERY Left   . KNEE ARTHROPLASTY    . OVARIAN CYST REMOVAL    . SPINE SURGERY      Family Psychiatric History: Reviewed family psychiatric history from my progress note on 10/19/2017.  Family History:  Family History  Problem Relation Age of Onset  . Diabetes Mother   . Cancer Mother        breast  . Heart disease Mother   . Mental illness Mother        bipolar disorder  . Stroke Mother   . Breast cancer Mother   . Diabetes Father   . Hypertension Father   . Diabetes Brother   . Bipolar disorder Brother   . Diabetes Brother   . Breast cancer Maternal Aunt   . Breast cancer Maternal Grandmother     Social History: I have reviewed social history from my progress note on 10/19/2017. Social History   Socioeconomic History  . Marital status: Divorced    Spouse name: Not on file  . Number of children:  0  . Years of education: 41  . Highest education level: 10th grade  Occupational History    Employer: UNEMPLOYED  Social Needs  . Financial resource strain: Very hard  . Food insecurity:    Worry: Often true    Inability: Often true  . Transportation needs:    Medical: No    Non-medical: No  Tobacco Use  . Smoking status: Passive Smoke Exposure - Never Smoker  . Smokeless tobacco: Never Used  . Tobacco comment: room mate smokes  Substance and Sexual Activity  . Alcohol use: No    Alcohol/week: 0.0 standard drinks  . Drug use: No  . Sexual activity: Not Currently  Lifestyle  . Physical activity:    Days per week: 0 days    Minutes per session: 0 min  .  Stress: Very much  Relationships  . Social connections:    Talks on phone: Three times a week    Gets together: Once a week    Attends religious service: 1 to 4 times per year    Active member of club or organization: No    Attends meetings of clubs or organizations: Never    Relationship status: Divorced  Other Topics Concern  . Not on file  Social History Narrative   Patient is divorced and lives with a roommate.   Patient is on disability.   Patient has a 10th grade education.   Patient is right-handed.   Patient drinks 6 sodas daily.          Allergies:  Allergies  Allergen Reactions  . Wellbutrin [Bupropion] Other (See Comments)    Irritability and Agitation  . Geodon [Ziprasidone Hcl] Other (See Comments)    Worsened Restless Leg Syndrome  . Lisinopril Cough  . Neupro [Rotigotine] Rash    Metabolic Disorder Labs: Lab Results  Component Value Date   HGBA1C 6.6 (H) 07/11/2014   No results found for: PROLACTIN Lab Results  Component Value Date   CHOL 220 (H) 03/25/2014   TRIG 204 (H) 03/25/2014   HDL 75 (H) 03/25/2014   VLDL 41 (H) 03/25/2014   LDLCALC 104 (H) 03/25/2014   No results found for: TSH  Therapeutic Level Labs: No results found for: LITHIUM No results found for: VALPROATE No components found for:  CBMZ  Current Medications: Current Outpatient Medications  Medication Sig Dispense Refill  . ACCU-CHEK AVIVA PLUS test strip     . baclofen (LIORESAL) 10 MG tablet TAKE 1 TABLET BY MOUTH 3 TIMES DAILY. 270 each 1  . carbamazepine (TEGRETOL) 200 MG tablet Take 0.5 tablets (100 mg total) by mouth daily. 45 tablet 3  . cetirizine (ZYRTEC) 10 MG tablet Take 10 mg by mouth daily.    . clobetasol ointment (TEMOVATE) 8.29 % Apply 1 application topically as directed.    . clonazePAM (KLONOPIN) 0.5 MG tablet Take 1 tablet (0.5 mg total) by mouth at bedtime. 30 tablet 3  . clotrimazole (LOTRIMIN) 1 % cream Apply 1 application topically 2 (two) times daily.  Use for at least 4 weeks (or for 1 week after rash has healed) 30 g 1  . DULoxetine (CYMBALTA) 60 MG capsule Take 1 capsule (60 mg total) by mouth daily. 90 capsule 1  . estradiol (ESTRACE) 0.5 MG tablet Take 1 tablet (0.5 mg total) by mouth daily. 30 tablet 11  . fluocinonide cream (LIDEX) 0.05 %     . gabapentin (NEURONTIN) 800 MG tablet Take 1 tablet (800 mg total) by mouth 4 (  four) times daily. (Patient taking differently: Take 1 tablet (800MG ) by mouth every morning and daily around lunchtime then take 2 tablets (1600MG ) by mouth at bedtime) 120 tablet 6  . glimepiride (AMARYL) 2 MG tablet     . hydrOXYzine (VISTARIL) 25 MG capsule Take 1 capsule (25 mg total) by mouth 3 (three) times daily as needed for anxiety (SEVERE ANXIETY ATTACKS). 90 capsule 1  . losartan-hydrochlorothiazide (HYZAAR) 100-25 MG tablet     . meloxicam (MOBIC) 15 MG tablet Take 15 mg daily by mouth.     . pramipexole (MIRAPEX) 0.75 MG tablet TAKE 1 TABLET BY MOUTH ONCE EVERY MORNING AND 2 TABLETS EVERY EVENING. 270 tablet 2  . rosuvastatin (CRESTOR) 40 MG tablet Take 1 tablet by mouth daily.    Marland Kitchen triamcinolone (KENALOG) 0.025 % ointment     . amoxicillin-clavulanate (AUGMENTIN) 875-125 MG tablet     . furosemide (LASIX) 20 MG tablet Take 1 tablet (20 mg total) by mouth daily for 5 days. 5 tablet 0  . paliperidone (INVEGA) 6 MG 24 hr tablet Take 1 tablet (6 mg total) by mouth daily. 30 tablet 0  . predniSONE (DELTASONE) 20 MG tablet      No current facility-administered medications for this visit.      Musculoskeletal: Strength & Muscle Tone: within normal limits Gait & Station: normal Patient leans: N/A  Psychiatric Specialty Exam: Review of Systems  Psychiatric/Behavioral: Positive for depression. The patient is nervous/anxious and has insomnia.   All other systems reviewed and are negative.   Blood pressure (!) 151/73, pulse 94, temperature 97.8 F (36.6 C), temperature source Tympanic, weight 275 lb (124.7  kg).Body mass index is 46.47 kg/m.  General Appearance: Casual  Eye Contact:  Fair  Speech:  Clear and Coherent  Volume:  Normal  Mood:  Anxious and Irritable  Affect:  Congruent  Thought Process:  Goal Directed and Descriptions of Associations: Intact  Orientation:  Full (Time, Place, and Person)  Thought Content: Logical   Suicidal Thoughts:  No  Homicidal Thoughts:  No  Memory:  Immediate;   Fair Recent;   Fair Remote;   Fair  Judgement:  Fair  Insight:  Fair  Psychomotor Activity:  Normal  Concentration:  Concentration: Fair and Attention Span: Fair  Recall:  [employer] of Knowledge: Fair  Language: Fair  Akathisia:  No  Handed:  Right  AIMS (if indicated): Denies tremors, rigidity, stiffness  Assets:  Communication Skills Desire for Improvement Social Support  ADL's:  Intact  Cognition: WNL  Sleep:  Poor   Screenings:   Assessment and Plan: Rachel Luna is a 63 year old Caucasian female, divorced, on disability, lives in [place] with a roommate, has a history of bipolar disorder, insomnia, anxiety disorder, seizure disorder, neuropathy, chronic pain, morbid obesity, current bronchiolitis, presented to the clinic today for a follow-up visit.  Patient continues to struggle with mood lability and also has bronchitis now which is making her mood symptoms and sleep worse.  Patient will benefit from medication changes.  Plan For bipolar disorder-unstable Increase Invega to 6 mg p.o. daily Continue Tegretol 100 mg p.o. daily-being prescribed per neurology for seizures-currently being tapered off due to side effects.  For anxiety disorder-unstable Cymbalta 60 mg p.o. daily Increase hydroxyzine to 25 mg 3 times daily as needed.  For insomnia-unstable Discussed with patient to continue melatonin, she could increase it to 9 mg p.o. nightly. Also discussed trying hydroxyzine as needed at bedtime. Patient continues to be on Klonopin and  Mirapex per her other provider for restless  leg syndrome.  Elevated blood pressure Reviewed patient's blood pressure today as elevated.  Will refer her back to her primary medical doctor.  Patient will continue psychotherapy sessions with [name].  Discussed with patient to return to clinic in 12 to 14 days or sooner if needed.  I have spent atleast 25 minutes face to face with patient today. More than 50 % of the time was spent for psychoeducation and supportive psychotherapy and care coordination.  This note was generated in part or whole with voice recognition software. Voice recognition is usually quite accurate but there are transcription errors that can and very often do occur. I apologize for any typographical errors that were not detected and corrected.      Ursula Alert, MD 03/22/2018, 5:16 PM

## 2018-04-05 ENCOUNTER — Other Ambulatory Visit: Payer: Self-pay

## 2018-04-05 ENCOUNTER — Encounter: Payer: Self-pay | Admitting: Psychiatry

## 2018-04-05 ENCOUNTER — Ambulatory Visit (INDEPENDENT_AMBULATORY_CARE_PROVIDER_SITE_OTHER): Payer: Medicare Other | Admitting: Psychiatry

## 2018-04-05 VITALS — BP 152/76 | HR 98 | Temp 97.6°F | Wt 278.8 lb

## 2018-04-05 DIAGNOSIS — F411 Generalized anxiety disorder: Secondary | ICD-10-CM | POA: Diagnosis not present

## 2018-04-05 DIAGNOSIS — F5105 Insomnia due to other mental disorder: Secondary | ICD-10-CM | POA: Diagnosis not present

## 2018-04-05 DIAGNOSIS — F3160 Bipolar disorder, current episode mixed, unspecified: Secondary | ICD-10-CM

## 2018-04-05 DIAGNOSIS — R030 Elevated blood-pressure reading, without diagnosis of hypertension: Secondary | ICD-10-CM | POA: Diagnosis not present

## 2018-04-05 MED ORDER — ARIPIPRAZOLE 5 MG PO TABS: 5.0000 mg | ORAL_TABLET | Freq: Every day | ORAL | 0 refills | Status: DC

## 2018-04-05 NOTE — Progress Notes (Signed)
[facility] MD OP Progress Note  04/05/2018 11:32 AM Rachel Luna  MRN:  [id number]  Chief Complaint: ' I am here for follow up." Chief Complaint    Follow-up; Medication Refill     [name]:MCNOBS is a 63 year old Caucasian female, divorced, disabled, lives in [place], has a history of bipolar disorder, anxiety disorder, insomnia, seizure disorder, morbid obesity, chronic back pain, neuropathy, presented to clinic today for a follow-up visit.  Pt today reports that she continues to struggle with mood lability , mostly anxiety .  She reports she had to stop taking the Invega since it gave her racing heart rate and she was very irritable and labile.  She reports when she stopped the Invega those symptoms have resolved.  Patient reports sleep is fair.  Patient continues to be in therapy with [name] which is going well.  Patient reports she used to take Abilify along time ago and as far as she can remember she was doing well on that.  She would like to get back on her Abilify.  She denies any side effects on the Abilify as far as she can remember.  Patient denies any suicidality, homicidality or perceptual disturbances.     Visit Diagnosis:    ICD-10-CM   1. Bipolar I disorder, most recent episode mixed (HCC) F31.60 ARIPiprazole (ABILIFY) 5 MG tablet  2. GAD (generalized anxiety disorder) F41.1 ARIPiprazole (ABILIFY) 5 MG tablet  3. Insomnia due to mental disorder F51.05   4. Elevated blood pressure reading R03.0     Past Psychiatric History:   Reviewed past psychiatric history from my progress note on 10/19/2017.  Past trials of Abilify, Klonopin, Elavil, Cymbalta, Tegretol, Effexor, trazodone,     Past Medical History:  Past Medical History:  Diagnosis Date  . Anxiety   . Arthritis   . Bipolar disorder ([place])   . chronic low back pain   . Chronic low back pain 02/18/2014  . Depression   . Diabetes mellitus without complication ([place])   . Diabetes mellitus, type II ([place])   .  Fibroids   . GERD (gastroesophageal reflux disease)   . Gout   . Hyperlipidemia   . Hypertension   . Obesity     Past Surgical History:  Procedure Laterality Date  . ABDOMINAL HYSTERECTOMY    . CHOLECYSTECTOMY    . FOOT SURGERY Left   . KNEE ARTHROPLASTY    . OVARIAN CYST REMOVAL    . SPINE SURGERY      Family Psychiatric History: Reviewed family psychiatric history from my progress note on 10/19/2017.  Family History:  Family History  Problem Relation Age of Onset  . Diabetes Mother   . Cancer Mother        breast  . Heart disease Mother   . Mental illness Mother        bipolar disorder  . Stroke Mother   . Breast cancer Mother   . Diabetes Father   . Hypertension Father   . Diabetes Brother   . Bipolar disorder Brother   . Diabetes Brother   . Breast cancer Maternal Aunt   . Breast cancer Maternal Grandmother     Social History: Reviewed social history from my progress note on 10/19/2017. Social History   Socioeconomic History  . Marital status: Divorced    Spouse name: Not on file  . Number of children: 0  . Years of education: 10  . Highest education level: 10th grade  Occupational History  Employer: UNEMPLOYED  Social Needs  . Financial resource strain: Very hard  . Food insecurity:    Worry: Often true    Inability: Often true  . Transportation needs:    Medical: No    Non-medical: No  Tobacco Use  . Smoking status: Passive Smoke Exposure - Never Smoker  . Smokeless tobacco: Never Used  . Tobacco comment: room mate smokes  Substance and Sexual Activity  . Alcohol use: No    Alcohol/week: 0.0 standard drinks  . Drug use: No  . Sexual activity: Not Currently  Lifestyle  . Physical activity:    Days per week: 0 days    Minutes per session: 0 min  . Stress: Very much  Relationships  . Social connections:    Talks on phone: Three times a week    Gets together: Once a week    Attends religious service: 1 to 4 times per year    Active member  of club or organization: No    Attends meetings of clubs or organizations: Never    Relationship status: Divorced  Other Topics Concern  . Not on file  Social History Narrative   Patient is divorced and lives with a roommate.   Patient is on disability.   Patient has a 10th grade education.   Patient is right-handed.   Patient drinks 6 sodas daily.          Allergies:  Allergies  Allergen Reactions  . Wellbutrin [Bupropion] Other (See Comments)    Irritability and Agitation  . Geodon [Ziprasidone Hcl] Other (See Comments)    Worsened Restless Leg Syndrome  . Lisinopril Cough  . Neupro [Rotigotine] Rash    Metabolic Disorder Labs: Lab Results  Component Value Date   HGBA1C 6.6 (H) 07/11/2014   No results found for: PROLACTIN Lab Results  Component Value Date   CHOL 220 (H) 03/25/2014   TRIG 204 (H) 03/25/2014   HDL 75 (H) 03/25/2014   VLDL 41 (H) 03/25/2014   LDLCALC 104 (H) 03/25/2014   No results found for: TSH  Therapeutic Level Labs: No results found for: LITHIUM No results found for: VALPROATE No components found for:  CBMZ  Current Medications: Current Outpatient Medications  Medication Sig Dispense Refill  . ACCU-CHEK AVIVA PLUS test strip     . baclofen (LIORESAL) 10 MG tablet TAKE 1 TABLET BY MOUTH 3 TIMES DAILY. 270 each 1  . carbamazepine (TEGRETOL) 200 MG tablet Take 0.5 tablets (100 mg total) by mouth daily. 45 tablet 3  . cetirizine (ZYRTEC) 10 MG tablet Take 10 mg by mouth daily.    . clonazePAM (KLONOPIN) 0.5 MG tablet Take 1 tablet (0.5 mg total) by mouth at bedtime. 30 tablet 3  . DULoxetine (CYMBALTA) 60 MG capsule Take 1 capsule (60 mg total) by mouth daily. 90 capsule 1  . estradiol (ESTRACE) 0.5 MG tablet Take 1 tablet (0.5 mg total) by mouth daily. 30 tablet 11  . gabapentin (NEURONTIN) 800 MG tablet Take 1 tablet (800 mg total) by mouth 4 (four) times daily. (Patient taking differently: Take 1 tablet (800MG ) by mouth every morning and  daily around lunchtime then take 2 tablets (1600MG ) by mouth at bedtime) 120 tablet 6  . glimepiride (AMARYL) 2 MG tablet     . hydrOXYzine (VISTARIL) 25 MG capsule Take 1 capsule (25 mg total) by mouth 3 (three) times daily as needed for anxiety (SEVERE ANXIETY ATTACKS). 90 capsule 1  . losartan-hydrochlorothiazide (HYZAAR) 100-25 MG tablet     .  meloxicam (MOBIC) 15 MG tablet Take 15 mg daily by mouth.     . pramipexole (MIRAPEX) 0.75 MG tablet TAKE 1 TABLET BY MOUTH ONCE EVERY MORNING AND 2 TABLETS EVERY EVENING. 270 tablet 2  . rosuvastatin (CRESTOR) 40 MG tablet Take 1 tablet by mouth daily.    . ARIPiprazole (ABILIFY) 5 MG tablet Take 1 tablet (5 mg total) by mouth daily. 30 tablet 0   No current facility-administered medications for this visit.      Musculoskeletal: Strength & Muscle Tone: within normal limits Gait & Station: normal Patient leans: N/A  Psychiatric Specialty Exam: Review of Systems  Psychiatric/Behavioral: The patient is nervous/anxious.   All other systems reviewed and are negative.   Blood pressure (!) 152/76, pulse 98, temperature 97.6 F (36.4 C), temperature source Oral, weight 278 lb 12.8 oz (126.5 kg).Body mass index is 47.12 kg/m.  General Appearance: Casual  Eye Contact:  Fair  Speech:  Clear and Coherent  Volume:  Normal  Mood:  Anxious  Affect:  Congruent  Thought Process:  Goal Directed and Descriptions of Associations: Intact  Orientation:  Full (Time, Place, and Person)  Thought Content: Logical   Suicidal Thoughts:  No  Homicidal Thoughts:  No  Memory:  Immediate;   Fair Recent;   Fair Remote;   Fair  Judgement:  Fair  Insight:  Fair  Psychomotor Activity:  Normal  Concentration:  Concentration: Fair and Attention Span: Fair  Recall:  [employer] of Knowledge: Fair  Language: Fair  Akathisia:  No  Handed:  Right  AIMS (if indicated): Denies tremors, rigidity, stiffness  Assets:  Communication Skills Desire for Improvement Social  Support  ADL's:  Intact  Cognition: WNL  Sleep:  Fair   Screenings:   Assessment and Plan: Evelyna is a 63 year old Caucasian female, divorced, on disability, lives in [place], with a roommate, has a history of bipolar disorder, insomnia, anxiety disorder, seizure disorder, neuropathy, chronic pain, morbid obesity, presented to clinic today for a follow-up visit.  Patient today reports that she continues to struggle with anxiety.  She continues to be in psychotherapy sessions which are going well.  Discussed medication changes as noted below.  Plan For bipolar disorder-unstable Discontinue Invega for side effects. Start Abilify 5 mg p.o. daily Continue Tegretol 100 mg p.o. daily-being prescribed per neurology for seizures-currently being tapered off due to side effects.  For anxiety disorder-unstable Continue Cymbalta 60 mg p.o. daily. Hydroxyzine 25 mg 3 times daily as needed  Insomnia-improving Continue melatonin as needed. She is also on Klonopin and Mirapex per her other providers for restless leg syndrome.  For elevated blood pressure Patient reports she did not take her hypertensive medication this morning.  She will continue to follow up with her primary medical doctor.  Continue psychotherapy sessions with [name].  Follow up in 2 weeks or sooner if needed.  I have spent atleast 15 minutes  face to face with patient today. More than 50 % of the time was spent for psychoeducation and supportive psychotherapy and care coordination.  This note was generated in part or whole with voice recognition software. Voice recognition is usually quite accurate but there are transcription errors that can and very often do occur. I apologize for any typographical errors that were not detected and corrected.      Ursula Alert, MD 04/05/2018, 11:32 AM

## 2018-04-05 NOTE — Patient Instructions (Signed)
Aripiprazole tablets What is this medicine? ARIPIPRAZOLE (ay ri PIP ray zole) is an atypical antipsychotic. It is used to treat schizophrenia and bipolar disorder, also known as manic-depression. It is also used to treat Tourette's disorder and some symptoms of autism. This medicine may also be used in combination with antidepressants to treat major depressive disorder. This medicine may be used for other purposes; ask your health care provider or pharmacist if you have questions. COMMON BRAND NAME(S): Abilify What should I tell my health care provider before I take this medicine? They need to know if you have any of these conditions: -dehydration -dementia -diabetes -heart disease -history of stroke -low blood counts, like low white cell, platelet, or red cell counts -Parkinson's disease -seizures -suicidal thoughts, plans, or attempt; a previous suicide attempt by you or a family member -an unusual or allergic reaction to aripiprazole, other medicines, foods, dyes, or preservatives -pregnant or trying to get pregnant -breast-feeding How should I use this medicine? Take this medicine by mouth with a glass of water. Follow the directions on the prescription label. You can take this medicine with or without food. Take your doses at regular intervals. Do not take your medicine more often than directed. Do not stop taking except on the advice of your doctor or health care professional. A special MedGuide will be given to you by the pharmacist with each prescription and refill. Be sure to read this information carefully each time. Talk to your pediatrician regarding the use of this medicine in children. While this drug may be prescribed for children as young as 6 years of age for selected conditions, precautions do apply. Overdosage: If you think you have taken too much of this medicine contact a poison control center or emergency room at once. NOTE: This medicine is only for you. Do not share  this medicine with others. What if I miss a dose? If you miss a dose, take it as soon as you can. If it is almost time for your next dose, take only that dose. Do not take double or extra doses. What may interact with this medicine? Do not take this medicine with any of the following medications: -brexpiprazole -cisapride -dofetilide -dronedarone -metoclopramide -pimozide -thioridazine This medicine may also interact with the following medications: -alcohol -carbamazepine -certain medicines for anxiety or sleep -certain medicines for blood pressure -certain medicines for fungal infections like ketoconazole, fluconazole, posaconazole, and itraconazole -clarithromycin -fluoxetine -other medicines that prolong the QT interval (cause an abnormal heart rhythm) -paroxetine -quinidine -rifampin This list may not describe all possible interactions. Give your health care provider a list of all the medicines, herbs, non-prescription drugs, or dietary supplements you use. Also tell them if you smoke, drink alcohol, or use illegal drugs. Some items may interact with your medicine. What should I watch for while using this medicine? Visit your doctor or health care professional for regular checks on your progress. It may be several weeks before you see the full effects of this medicine. Do not suddenly stop taking this medicine. You may need to gradually reduce the dose. Patients and their families should watch out for worsening depression or thoughts of suicide. Also watch out for sudden changes in feelings such as feeling anxious, agitated, panicky, irritable, hostile, aggressive, impulsive, severely restless, overly excited and hyperactive, or not being able to sleep. If this happens, especially at the beginning of antidepressant treatment or after a change in dose, call your health care professional. You may get dizzy or drowsy. Do   not drive, use machinery, or do anything that needs mental  alertness until you know how this medicine affects you. Do not stand or sit up quickly, especially if you are an older patient. This reduces the risk of dizzy or fainting spells. Alcohol can increase dizziness and drowsiness. Avoid alcoholic drinks. This medicine can reduce the response of your body to heat or cold. Dress warmly in cold weather and stay hydrated in hot weather. If possible, avoid extreme temperatures like saunas, hot tubs, very hot or cold showers, or activities that can cause dehydration such as vigorous exercise. This medicine may cause dry eyes and blurred vision. If you wear contact lenses, you may feel some discomfort. Lubricating drops may help. See your eye doctor if the problem does not go away or is severe. If you notice an increased hunger or thirst, different from your normal hunger or thirst, or if you find that you have to urinate more frequently, you should contact your health care provider as soon as possible. You may need to have your blood sugar monitored. This medicine may cause changes in your blood sugar levels. You should monitor your blood sugar frequently if you have diabetes. There have been reports of uncontrollable and strong urges to gamble, binge eat, shop, and have sex while taking this medicine. If you experience any of these or other uncontrollable and strong urges while taking this medicine, you should report it to your health care provider as soon as possible. What side effects may I notice from receiving this medicine? Side effects that you should report to your doctor or health care professional as soon as possible: -allergic reactions like skin rash, itching or hives, swelling of the face, lips, or tongue -breathing problems -confusion -feeling faint or lightheaded, falls -fever or chills, sore throat -increased hunger or thirst -increased urination -joint pain -muscles pain, spasms -problems with balance, talking, walking -restlessness or need to  keep moving -seizures -suicidal thoughts or other mood changes -trouble swallowing -uncontrollable and excessive urges (examples: gambling, binge eating, shopping, having sex) -uncontrollable head, mouth, neck, arm, or leg movements -unusually weak or tired Side effects that usually do not require medical attention (report to your doctor or health care professional if they continue or are bothersome): -blurred vision -constipation -headache -nausea, vomiting -trouble sleeping -weight gain This list may not describe all possible side effects. Call your doctor for medical advice about side effects. You may report side effects to FDA at 1-800-FDA-1088. Where should I keep my medicine? Keep out of the reach of children. Store at room temperature between 15 and 30 degrees C (59 and 86 degrees F). Throw away any unused medicine after the expiration date. NOTE: This sheet is a summary. It may not cover all possible information. If you have questions about this medicine, talk to your doctor, pharmacist, or health care provider.  2019 Elsevier/Gold Standard (2017-08-18 09:13:39)  

## 2018-04-19 ENCOUNTER — Ambulatory Visit: Payer: Medicare Other | Admitting: Psychiatry

## 2018-04-23 ENCOUNTER — Other Ambulatory Visit: Payer: Self-pay | Admitting: Neurology

## 2018-05-08 ENCOUNTER — Telehealth: Payer: Self-pay

## 2018-05-08 MED ORDER — DULOXETINE HCL 30 MG PO CPEP: 30.0000 mg | ORAL_CAPSULE | Freq: Every day | ORAL | 1 refills | Status: DC

## 2018-05-08 MED ORDER — PROPRANOLOL HCL 10 MG PO TABS: 10.0000 mg | ORAL_TABLET | Freq: Three times a day (TID) | ORAL | 1 refills | Status: DC | PRN

## 2018-05-08 NOTE — Telephone Encounter (Signed)
Spoke to patient, made med changes , increased cymbalta to 90 mg and added propranolol as needed.

## 2018-05-08 NOTE — Telephone Encounter (Signed)
Medication management - Telephone call with pt after she left a message she is having some increased anxiety currently due to her Father now has dementia and several people close to her are quite ill.  Patient states "that Vistaril isn't doing anything" as reports she is taking it 3 times a day.  Patient denied any suicidal or homicidal ideations, no plan or intent to want to harm self but did state "something has to be done" and "I need some help".  Patient reported due to taking care of her Dad at present and staying with him, she cannot come into the office at present but requests Dr. Shea Evans consider something else for anxiety. Patient last seen 04/05/18, reports had to cancel 04/19/18 due to caring for Father and nothing currently scheduled as a follow up.  Agreed to send request to Dr. Shea Evans today.

## 2018-05-22 ENCOUNTER — Other Ambulatory Visit: Payer: Self-pay | Admitting: Neurology

## 2018-06-03 ENCOUNTER — Other Ambulatory Visit: Payer: Self-pay | Admitting: Neurology

## 2018-06-06 ENCOUNTER — Other Ambulatory Visit: Payer: Self-pay | Admitting: Neurology

## 2018-06-08 ENCOUNTER — Other Ambulatory Visit: Payer: Self-pay

## 2018-06-08 ENCOUNTER — Ambulatory Visit: Payer: Medicare Other | Admitting: Psychiatry

## 2018-06-08 ENCOUNTER — Ambulatory Visit (INDEPENDENT_AMBULATORY_CARE_PROVIDER_SITE_OTHER): Payer: Medicare Other | Admitting: Psychiatry

## 2018-06-08 ENCOUNTER — Encounter: Payer: Self-pay | Admitting: Psychiatry

## 2018-06-08 DIAGNOSIS — F3160 Bipolar disorder, current episode mixed, unspecified: Secondary | ICD-10-CM

## 2018-06-08 DIAGNOSIS — F5105 Insomnia due to other mental disorder: Secondary | ICD-10-CM | POA: Diagnosis not present

## 2018-06-08 DIAGNOSIS — F411 Generalized anxiety disorder: Secondary | ICD-10-CM | POA: Diagnosis not present

## 2018-06-08 MED ORDER — DULOXETINE HCL 30 MG PO CPEP: 30.0000 mg | ORAL_CAPSULE | Freq: Every day | ORAL | 0 refills | Status: DC

## 2018-06-08 MED ORDER — DULOXETINE HCL 60 MG PO CPEP: 60.0000 mg | ORAL_CAPSULE | Freq: Every day | ORAL | 1 refills | Status: DC

## 2018-06-08 NOTE — Progress Notes (Signed)
Virtual Visit via Telephone Note  I connected with Rachel Luna on 06/08/18 at  1:00 PM EDT by telephone and verified that I am speaking with the correct person using two identifiers.   I discussed the limitations, risks, security and privacy concerns of performing an evaluation and management service by telephone and the availability of in person appointments. I also discussed with the patient that there may be a patient responsible charge related to this service. The patient expressed understanding and agreed to proceed.    I discussed the assessment and treatment plan with the patient. The patient was provided an opportunity to ask questions and all were answered. The patient agreed with the plan and demonstrated an understanding of the instructions.   The patient was advised to call back or seek an in-person evaluation if the symptoms worsen or if the condition fails to improve as anticipated.  I provided 15 minutes of non-face-to-face time during this encounter.   Ursula Alert, MD  [facility] MD/PA/NP OP Progress Note  06/08/2018 1:15 PM Rachel Luna  MRN:  [id number]  Chief Complaint: ' I am here for follow up." Chief Complaint    Follow-up     HPI: Rachel Luna is a 63 year old Caucasian female, divorced, disabled, lives in [place], has a history of bipolar disorder, generalized anxiety disorder, insomnia, seizure disorder, morbid obesity, chronic back pain, neuropathy, was evaluated by phone today.  Patient today reports she has been going back and forth with her home and her father's in [place].  Her father is elderly and continues to need her support.  She reports she is so far staying safe from the COVID-19.  She is following all the precautions.  She reports she stopped taking the Abilify since she did not like the effect and it gave her side effects of agitation.  She reports so far she is doing well just on the Cymbalta and wants to stay on the Cymbalta at this time.  She does  report some anxiety symptoms about the coronavirus crisis going on however she has been able to cope with it.  She reports she started taking the propranolol as needed for her anxiety symptoms.  She denies any side effects.  Patient denies any suicidality, homicidality or perceptual disturbances.  She continues to see her therapist [name].  Patient denies any other concerns today. Visit Diagnosis:    ICD-10-CM   1. Bipolar I disorder, most recent episode mixed (HCC) F31.60 DULoxetine (CYMBALTA) 60 MG capsule    DULoxetine (CYMBALTA) 30 MG capsule  2. GAD (generalized anxiety disorder) F41.1 DULoxetine (CYMBALTA) 60 MG capsule    DULoxetine (CYMBALTA) 30 MG capsule  3. Insomnia due to mental disorder F51.05     Past Psychiatric History: I have reviewed past psychiatric history from my progress note on 10/19/2017.  Past trials of Abilify, Klonopin, Elavil, Cymbalta, Tegretol, Effexor, trazodone.  Past Medical History:  Past Medical History:  Diagnosis Date  . Anxiety   . Arthritis   . Bipolar disorder ([place])   . chronic low back pain   . Chronic low back pain 02/18/2014  . Depression   . Diabetes mellitus without complication ([place])   . Diabetes mellitus, type II ([place])   . Fibroids   . GERD (gastroesophageal reflux disease)   . Gout   . Hyperlipidemia   . Hypertension   . Obesity     Past Surgical History:  Procedure Laterality Date  . ABDOMINAL HYSTERECTOMY    . CHOLECYSTECTOMY    .  FOOT SURGERY Left   . KNEE ARTHROPLASTY    . OVARIAN CYST REMOVAL    . SPINE SURGERY      Family Psychiatric History: I have reviewed family psychiatric history from my progress note on 10/19/2017.  Family History:  Family History  Problem Relation Age of Onset  . Diabetes Mother   . Cancer Mother        breast  . Heart disease Mother   . Mental illness Mother        bipolar disorder  . Stroke Mother   . Breast cancer Mother   . Diabetes Father   . Hypertension Father    . Diabetes Brother   . Bipolar disorder Brother   . Diabetes Brother   . Breast cancer Maternal Aunt   . Breast cancer Maternal Grandmother     Social History: Reviewed social history from my progress note on 10/19/2017. Social History   Socioeconomic History  . Marital status: Divorced    Spouse name: Not on file  . Number of children: 0  . Years of education: 10  . Highest education level: 10th grade  Occupational History    Employer: UNEMPLOYED  Social Needs  . Financial resource strain: Very hard  . Food insecurity:    Worry: Often true    Inability: Often true  . Transportation needs:    Medical: No    Non-medical: No  Tobacco Use  . Smoking status: Passive Smoke Exposure - Never Smoker  . Smokeless tobacco: Never Used  . Tobacco comment: room mate smokes  Substance and Sexual Activity  . Alcohol use: No    Alcohol/week: 0.0 standard drinks  . Drug use: No  . Sexual activity: Not Currently  Lifestyle  . Physical activity:    Days per week: 0 days    Minutes per session: 0 min  . Stress: Very much  Relationships  . Social connections:    Talks on phone: Three times a week    Gets together: Once a week    Attends religious service: 1 to 4 times per year    Active member of club or organization: No    Attends meetings of clubs or organizations: Never    Relationship status: Divorced  Other Topics Concern  . Not on file  Social History Narrative   Patient is divorced and lives with a roommate.   Patient is on disability.   Patient has a 10th grade education.   Patient is right-handed.   Patient drinks 6 sodas daily.          Allergies:  Allergies  Allergen Reactions  . Wellbutrin [Bupropion] Other (See Comments)    Irritability and Agitation  . Geodon [Ziprasidone Hcl] Other (See Comments)    Worsened Restless Leg Syndrome  . Lisinopril Cough  . Neupro [Rotigotine] Rash    Metabolic Disorder Labs: Lab Results  Component Value Date   HGBA1C  6.6 (H) 07/11/2014   No results found for: PROLACTIN Lab Results  Component Value Date   CHOL 220 (H) 03/25/2014   TRIG 204 (H) 03/25/2014   HDL 75 (H) 03/25/2014   VLDL 41 (H) 03/25/2014   LDLCALC 104 (H) 03/25/2014   No results found for: TSH  Therapeutic Level Labs: No results found for: LITHIUM No results found for: VALPROATE No components found for:  CBMZ  Current Medications: Current Outpatient Medications  Medication Sig Dispense Refill  . ACCU-CHEK AVIVA PLUS test strip     . baclofen (LIORESAL)  10 MG tablet TAKE 1 TABLET BY MOUTH 3 TIMES DAILY. 270 each 1  . carbamazepine (TEGRETOL) 200 MG tablet TAKE 1/2 TABLET BY MOUTH ONCE DAILY 45 tablet 1  . cetirizine (ZYRTEC) 10 MG tablet Take 10 mg by mouth daily.    . clonazePAM (KLONOPIN) 0.5 MG tablet TAKE 1 TABLET BY MOUTH AT BEDTIME 30 tablet 3  . DULoxetine (CYMBALTA) 30 MG capsule Take 1 capsule (30 mg total) by mouth daily. To be combined with 60 mg 90 capsule 0  . DULoxetine (CYMBALTA) 60 MG capsule Take 1 capsule (60 mg total) by mouth daily. 90 capsule 1  . estradiol (ESTRACE) 0.5 MG tablet Take 1 tablet (0.5 mg total) by mouth daily. 30 tablet 11  . gabapentin (NEURONTIN) 800 MG tablet Take 1 tablet (800 mg total) by mouth 4 (four) times daily. (Patient taking differently: Take 1 tablet (800MG ) by mouth every morning and daily around lunchtime then take 2 tablets (1600MG ) by mouth at bedtime) 120 tablet 6  . glimepiride (AMARYL) 2 MG tablet     . losartan-hydrochlorothiazide (HYZAAR) 100-25 MG tablet     . meloxicam (MOBIC) 15 MG tablet Take 15 mg daily by mouth.     . pramipexole (MIRAPEX) 0.75 MG tablet TAKE 1 TABLET BY MOUTH ONCE EVERY MORNING AND 2 TABLET EVERY EVENING 270 tablet 2  . propranolol (INDERAL) 10 MG tablet Take 1 tablet (10 mg total) by mouth 3 (three) times daily as needed. For severe anxiety attacks only 90 tablet 1  . rosuvastatin (CRESTOR) 40 MG tablet Take 1 tablet by mouth daily.     No current  facility-administered medications for this visit.      Musculoskeletal: Strength & Muscle Tone: UTA Gait & Station: UTA Patient leans: N/A  Psychiatric Specialty Exam: Review of Systems  Psychiatric/Behavioral: The patient is nervous/anxious.   All other systems reviewed and are negative.   There were no vitals taken for this visit.There is no height or weight on file to calculate BMI.  General Appearance: UTA  Eye Contact:  UTA  Speech:  Clear and Coherent  Volume:  Normal  Mood:  Anxious  Affect:  UTA  Thought Process:  Goal Directed and Descriptions of Associations: Intact  Orientation:  Full (Time, Place, and Person)  Thought Content: Logical   Suicidal Thoughts:  No  Homicidal Thoughts:  No  Memory:  Immediate;   Fair Recent;   Fair Remote;   Fair  Judgement:  Fair  Insight:  Fair  Psychomotor Activity:  UTA  Concentration:  Concentration: Fair and Attention Span: Fair  Recall:  [employer] of Knowledge: Fair  Language: Fair  Akathisia:  No  Handed:  Right  AIMS (if indicated):Denies tremors, rigidity,stiffness  Assets:  Communication Skills Desire for Improvement Social Support  ADL's:  Intact  Cognition: WNL  Sleep:  Fair   Screenings:   Assessment and Plan: Rachel Luna is a 63 year old Caucasian female, divorced, on disability, lives in [place], has a history of bipolar disorder, insomnia, anxiety disorder, seizure disorder, neuropathy, chronic pain, morbid obesity was evaluated by phone today.  Patient reports she is currently doing well on the current medication regimen.  She does have situational stressors and will continue to work with her therapist.  Plan as noted below.  Plan For bipolar disorder-improving Continue Cymbalta 90 mg p.o. daily Discontinue Abilify for noncompliance and side effects. Continue Tegretol 100 mg p.o. daily-prescribed by her neurology for seizures-currently being tapered off due to side effects.  For anxiety  disorder-improving Cymbalta 90 mg p.o. daily Propranolol 10 mg p.o. 3 times daily as needed  For insomnia-improving Continue melatonin. She is also on Klonopin and Mirapex per her other providers for restless leg syndrome.  Continue psychotherapy sessions with [name].  Follow-up in clinic in 1 month or sooner if needed.  I have spent atleast 15 minutes non face to face with patient today. More than 50 % of the time was spent for psychoeducation and supportive psychotherapy and care coordination.  This note was generated in part or whole with voice recognition software. Voice recognition is usually quite accurate but there are transcription errors that can and very often do occur. I apologize for any typographical errors that were not detected and corrected.        Ursula Alert, MD 06/08/2018, 1:15 PM

## 2018-06-08 NOTE — Progress Notes (Signed)
TC on 06-08-18 medication and surgical hx was reviewed with no changes, allergies reviewed with no changes, pt vitals was not taken due to this visit is over the phone.  Pt medications and pharmacy was reviewed and updated.

## 2018-07-03 ENCOUNTER — Ambulatory Visit: Payer: Medicare Other | Admitting: Emergency Medicine

## 2018-07-13 ENCOUNTER — Other Ambulatory Visit: Payer: Self-pay

## 2018-07-13 ENCOUNTER — Encounter: Payer: Self-pay | Admitting: Emergency Medicine

## 2018-07-13 ENCOUNTER — Ambulatory Visit (INDEPENDENT_AMBULATORY_CARE_PROVIDER_SITE_OTHER): Payer: Medicare Other | Admitting: Emergency Medicine

## 2018-07-13 VITALS — BP 142/83 | HR 67 | Temp 98.3°F | Resp 18 | Ht 65.0 in | Wt 276.0 lb

## 2018-07-13 DIAGNOSIS — G894 Chronic pain syndrome: Secondary | ICD-10-CM | POA: Diagnosis not present

## 2018-07-13 DIAGNOSIS — E119 Type 2 diabetes mellitus without complications: Secondary | ICD-10-CM | POA: Insufficient documentation

## 2018-07-13 DIAGNOSIS — I10 Essential (primary) hypertension: Secondary | ICD-10-CM

## 2018-07-13 DIAGNOSIS — E1165 Type 2 diabetes mellitus with hyperglycemia: Secondary | ICD-10-CM | POA: Diagnosis not present

## 2018-07-13 DIAGNOSIS — F325 Major depressive disorder, single episode, in full remission: Secondary | ICD-10-CM

## 2018-07-13 DIAGNOSIS — G2581 Restless legs syndrome: Secondary | ICD-10-CM | POA: Diagnosis not present

## 2018-07-13 LAB — POCT GLYCOSYLATED HEMOGLOBIN (HGB A1C): Hemoglobin A1C: 8.2 % — AB (ref 4.0–5.6)

## 2018-07-13 LAB — GLUCOSE, POCT (MANUAL RESULT ENTRY): POC Glucose: 140 mg/dl — AB (ref 70–99)

## 2018-07-13 MED ORDER — LOSARTAN POTASSIUM-HCTZ 100-25 MG PO TABS: 1.0000 | ORAL_TABLET | Freq: Every day | ORAL | 3 refills | Status: DC

## 2018-07-13 MED ORDER — SITAGLIPTIN PHOSPHATE 50 MG PO TABS: 50.0000 mg | ORAL_TABLET | Freq: Every day | ORAL | 1 refills | Status: DC

## 2018-07-13 MED ORDER — GABAPENTIN 800 MG PO TABS: 800.0000 mg | ORAL_TABLET | Freq: Three times a day (TID) | ORAL | 1 refills | Status: DC

## 2018-07-13 MED ORDER — GLIMEPIRIDE 4 MG PO TABS: 4.0000 mg | ORAL_TABLET | Freq: Every day | ORAL | 1 refills | Status: DC

## 2018-07-13 MED ORDER — ACCU-CHEK AVIVA PLUS VI STRP: ORAL_STRIP | 5 refills | Status: AC

## 2018-07-13 NOTE — Progress Notes (Signed)
Rachel Luna 63 y.o.   Chief Complaint  Patient presents with  . Establish Care  . Diabetes    per patient diagnosed last year and taking medication  . Medication Refill    accu-chek aviva plus, Hyzaar and gabapentin    HISTORY OF PRESENT ILLNESS: This is a 63 y.o. female here to establish care with me.  Has multiple chronic medical and psychiatric problems.  Under the care of a psychiatrist for bipolar disorder.  On medications.  Here to help today for handling of diabetes, hypertension, and dyslipidemia.  Patient is intolerant to metformin, taking glimepiride 2 mg a day, Hyzaar 100-25 once a day, and Crestor 40 mg daily. Also has a history of restless leg syndrome, sees a neurologist, on gabapentin for chronic pain and Mirapex. Problem list reviewed with patient. HPI   Prior to Admission medications   Medication Sig Start Date End Date Taking? Authorizing Provider  baclofen (LIORESAL) 10 MG tablet TAKE 1 TABLET BY MOUTH 3 TIMES DAILY. 03/02/18  Yes Kathrynn Ducking, MD  carbamazepine (TEGRETOL) 200 MG tablet TAKE 1/2 TABLET BY MOUTH ONCE DAILY 06/06/18  Yes Kathrynn Ducking, MD  cetirizine (ZYRTEC) 10 MG tablet Take 10 mg by mouth daily.   Yes [provider]  clonazePAM (KLONOPIN) 0.5 MG tablet TAKE 1 TABLET BY MOUTH AT BEDTIME 05/22/18  Yes Kathrynn Ducking, MD  DULoxetine (CYMBALTA) 30 MG capsule Take 1 capsule (30 mg total) by mouth daily. To be combined with 60 mg 06/08/18  Yes Eappen, Ria Clock, MD  DULoxetine (CYMBALTA) 60 MG capsule Take 1 capsule (60 mg total) by mouth daily. 06/08/18  Yes Ursula Alert, MD  estradiol (ESTRACE) 0.5 MG tablet Take 1 tablet (0.5 mg total) by mouth daily. 10/10/17  Yes Shelly Bombard, MD  gabapentin (NEURONTIN) 800 MG tablet Take 1 tablet (800 mg total) by mouth 3 (three) times daily. 07/13/18 10/11/18 Yes Jerra Huckeby, Ines Bloomer, MD  glimepiride (AMARYL) 2 MG tablet  12/14/17  Yes [provider]  losartan-hydrochlorothiazide  (HYZAAR) 100-25 MG tablet Take 1 tablet by mouth daily. 07/13/18 10/11/18 Yes Courtland, Ines Bloomer, MD  meloxicam (MOBIC) 15 MG tablet Take 15 mg daily by mouth.  07/04/15  Yes [provider]  pramipexole (MIRAPEX) 0.75 MG tablet TAKE 1 TABLET BY MOUTH ONCE EVERY MORNING AND 2 TABLET EVERY EVENING 06/05/18  Yes Kathrynn Ducking, MD  propranolol (INDERAL) 10 MG tablet Take 1 tablet (10 mg total) by mouth 3 (three) times daily as needed. For severe anxiety attacks only 05/08/18  Yes Eappen, Saramma, MD  rosuvastatin (CRESTOR) 40 MG tablet Take 1 tablet by mouth daily. 03/23/16  Yes [provider]  ACCU-CHEK AVIVA PLUS test strip Use in the morning and after meals as needed. 07/13/18   Horald Pollen, MD    Allergies  Allergen Reactions  . Wellbutrin [Bupropion] Other (See Comments)    Irritability and Agitation  . Geodon [Ziprasidone Hcl] Other (See Comments)    Worsened Restless Leg Syndrome  . Lisinopril Cough  . Neupro [Rotigotine] Rash    Patient Active Problem List   Diagnosis Date Noted  . Seizure disorder ([place]) 10/19/2017  . Ingrown toenail 04/15/2016  . Arthritis of foot, degenerative 04/10/2015  . 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 ANA positive 03/05/2015  . Foot pain 03/05/2015  . Elevated rheumatoid factor 03/05/2015  . BP (high blood pressure) 09/04/2014  . Avitaminosis D 09/04/2014  . Dizzy 07/31/2014  . Chronic low  back pain 02/18/2014  . Pain in joint, lower leg 08/10/2013  . Acid reflux 03/12/2013  . Restless legs syndrome (RLS) 02/16/2013  . Disturbance of skin sensation 02/16/2013  . Menopausal hot flushes 08/14/2012  . Arthritis of knee, degenerative 04/06/2011  . Restless leg 11/11/2010  . Benign essential HTN 07/13/2010  . Elevated fasting blood sugar 07/13/2010  . Cannot sleep 07/13/2010  . Chronic pain 01/08/2010  . Affective disorder, major 11/05/2009  . Major depressive disorder 11/05/2009  . Allergic rhinitis 07/03/2009     Past Medical History:  Diagnosis Date  . Anxiety   . Arthritis   . Bipolar disorder ([place])   . chronic low back pain   . Chronic low back pain 02/18/2014  . Depression   . Diabetes mellitus without complication ([place])   . Diabetes mellitus, type II ([place])   . Fibroids   . GERD (gastroesophageal reflux disease)   . Gout   . Hyperlipidemia   . Hypertension   . Obesity     Past Surgical History:  Procedure Laterality Date  . ABDOMINAL HYSTERECTOMY    . CHOLECYSTECTOMY    . FOOT SURGERY Left   . KNEE ARTHROPLASTY    . OVARIAN CYST REMOVAL    . SPINE SURGERY      Social History   Socioeconomic History  . Marital status: Divorced    Spouse name: Not on file  . Number of children: 0  . Years of education: 10  . Highest education level: 10th grade  Occupational History    Employer: UNEMPLOYED  Social Needs  . Financial resource strain: Very hard  . Food insecurity:    Worry: Often true    Inability: Often true  . Transportation needs:    Medical: No    Non-medical: No  Tobacco Use  . Smoking status: Passive Smoke Exposure - Never Smoker  . Smokeless tobacco: Never Used  . Tobacco comment: room mate smokes  Substance and Sexual Activity  . Alcohol use: No    Alcohol/week: 0.0 standard drinks  . Drug use: No  . Sexual activity: Not Currently  Lifestyle  . Physical activity:    Days per week: 0 days    Minutes per session: 0 min  . Stress: Very much  Relationships  . Social connections:    Talks on phone: Three times a week    Gets together: Once a week    Attends religious service: 1 to 4 times per year    Active member of club or organization: No    Attends meetings of clubs or organizations: Never    Relationship status: Divorced  . Intimate partner violence:    Fear of current or ex partner: No    Emotionally abused: No    Physically abused: No    Forced sexual activity: No  Other Topics Concern  . Not on file  Social History Narrative   Patient  is divorced and lives with a roommate.   Patient is on disability.   Patient has a 10th grade education.   Patient is right-handed.   Patient drinks 6 sodas daily.          Family History  Problem Relation Age of Onset  . Diabetes Mother   . Cancer Mother        breast  . Heart disease Mother   . Mental illness Mother        bipolar disorder  . Stroke Mother   . Breast cancer Mother   .  Diabetes Father   . Hypertension Father   . Diabetes Brother   . Bipolar disorder Brother   . Diabetes Brother   . Breast cancer Maternal Aunt   . Breast cancer Maternal Grandmother    Results for orders placed or performed in visit on 07/13/18 (from the past 24 hour(s))  POCT glucose (manual entry)     Status: Abnormal   Collection Time: 07/13/18 10:09 AM  Result Value Ref Range   POC Glucose 140 (A) 70 - 99 mg/dl  POCT glycosylated hemoglobin (Hb A1C)     Status: Abnormal   Collection Time: 07/13/18 10:09 AM  Result Value Ref Range   Hemoglobin A1C 8.2 (A) 4.0 - 5.6 %   HbA1c POC (<> result, manual entry)     HbA1c, POC (prediabetic range)     HbA1c, POC (controlled diabetic range)       Review of Systems  Constitutional: Negative for chills and fever.  HENT: Negative.  Negative for congestion and sore throat.   Eyes: Negative for blurred vision and double vision.  Respiratory: Negative.  Negative for cough and shortness of breath.   Cardiovascular: Negative for chest pain and palpitations.  Gastrointestinal: Negative.  Negative for abdominal pain, blood in stool, diarrhea, nausea and vomiting.  Genitourinary: Negative.   Musculoskeletal: Positive for back pain.  Skin: Negative.  Negative for rash.  Neurological: Negative for dizziness and headaches.  Endo/Heme/Allergies: Negative.   Psychiatric/Behavioral: Positive for depression.  All other systems reviewed and are negative.  Vitals:   07/13/18 0914  BP: (!) 142/83  Pulse: 67  Resp: 18  Temp: 98.3 F (36.8 C)  SpO2:  96%   Vitals:   07/13/18 0914  BP: (!) 142/83  Pulse: 67  Resp: 18  Temp: 98.3 F (36.8 C)  SpO2: 96%     Physical Exam Vitals signs reviewed.  Constitutional:      Appearance: Normal appearance.  HENT:     Head: Normocephalic and atraumatic.     Nose: Nose normal.     Mouth/Throat:     Mouth: Mucous membranes are moist.     Pharynx: Oropharynx is clear.  Eyes:     Extraocular Movements: Extraocular movements intact.     Conjunctiva/sclera: Conjunctivae normal.     Pupils: Pupils are equal, round, and reactive to light.  Neck:     Musculoskeletal: Normal range of motion.  Cardiovascular:     Rate and Rhythm: Normal rate and regular rhythm.     Pulses: Normal pulses.     Heart sounds: Normal heart sounds.  Pulmonary:     Effort: Pulmonary effort is normal.     Breath sounds: Normal breath sounds.  Musculoskeletal: Normal range of motion.  Skin:    General: Skin is warm and dry.     Capillary Refill: Capillary refill takes less than 2 seconds.  Neurological:     General: No focal deficit present.     Mental Status: She is alert and oriented to person, place, and time.  Psychiatric:        Mood and Affect: Mood normal.        Behavior: Behavior normal.    Results for orders placed or performed in visit on 07/13/18 (from the past 24 hour(s))  POCT glucose (manual entry)     Status: Abnormal   Collection Time: 07/13/18 10:09 AM  Result Value Ref Range   POC Glucose 140 (A) 70 - 99 mg/dl  POCT glycosylated hemoglobin (Hb A1C)  Status: Abnormal   Collection Time: 07/13/18 10:09 AM  Result Value Ref Range   Hemoglobin A1C 8.2 (A) 4.0 - 5.6 %   HbA1c POC (<> result, manual entry)     HbA1c, POC (prediabetic range)     HbA1c, POC (controlled diabetic range)     A total of 30 minutes was spent in the room with the patient, greater than 50% of which was in counseling/coordination of care regarding diabetes and related cardiovascular risk.  We spoke about diet and  nutrition as well as increasing physical activity.  Medication changes discussed as well as prognosis and potential complications of untreated diabetes.   ASSESSMENT & PLAN: [facility] was seen today for establish care, diabetes and medication refill.  Diagnoses and all orders for this visit:  Type 2 diabetes mellitus with hyperglycemia, without long-term current use of insulin (HCC) -     POCT glucose (manual entry) -     POCT glycosylated hemoglobin (Hb A1C) -     CBC with Differential/Platelet -     Comprehensive metabolic panel -     Thyroid Panel With TSH -     ACCU-CHEK AVIVA PLUS test strip; Use in the morning and after meals as needed. -     glimepiride (AMARYL) 4 MG tablet; Take 1 tablet (4 mg total) by mouth daily with breakfast. -     sitaGLIPtin (JANUVIA) 50 MG tablet; Take 1 tablet (50 mg total) by mouth daily.  Benign essential HTN -     CBC with Differential/Platelet -     Comprehensive metabolic panel -     Lipid panel -     losartan-hydrochlorothiazide (HYZAAR) 100-25 MG tablet; Take 1 tablet by mouth daily.  Restless legs syndrome (RLS) -     Thyroid Panel With TSH -     gabapentin (NEURONTIN) 800 MG tablet; Take 1 tablet (800 mg total) by mouth 3 (three) times daily.  Chronic pain syndrome  Major depressive disorder in remission, unspecified whether recurrent [facility])    Patient Instructions       If you have lab work done today you will be contacted with your lab results within the next 2 weeks.  If you have not heard from [place] then please contact us. The fastest way to get your results is to register for My Chart.   IF you received an x-ray today, you will receive an invoice from [facility]. Please contact [facility] Radiology at [phone] with questions or concerns regarding your invoice.   IF you received labwork today, you will receive an invoice from [facility]. Please contact [facility] at [phone] with questions or concerns regarding your  invoice.   Our billing staff will not be able to assist you with questions regarding bills from these companies.  You will be contacted with the lab results as soon as they are available. The fastest way to get your results is to activate your My Chart account. Instructions are located on the last page of this paperwork. If you have not heard from [place] regarding the results in 2 weeks, please contact this office.    We recommend that you schedule a mammogram for breast cancer screening. Typically, you do not need a referral to do this. Please contact a local imaging center to schedule your mammogram.  [facility] - [phone]  *ask for the Radiology [place]) - [phone] or [phone]  [place] [phone] [place] [phone])  [place] - [phone]  *ask for the Radiology Department [facility] - [phone]  *ask for the Radiology Department [facility] - [phone]  *ask for the Mammography Department [facility] - [phone] Diabetes Mellitus and Nutrition, Adult When you have diabetes (diabetes mellitus), it is very important to have healthy eating habits because your blood sugar (glucose) levels are greatly affected by what you eat and drink. Eating healthy foods in the appropriate amounts, at about the same times every day, can help you:  Control your blood glucose.  Lower your risk of heart disease.  Improve your blood pressure.  Reach or maintain a healthy weight. Every person with diabetes is different, and each person has different needs for a meal plan. Your health care provider may recommend that you work with a diet and nutrition specialist (dietitian) to make a meal plan that is best for you. Your meal plan may vary depending on factors such as:  The calories you need.  The medicines you take.  Your weight.  Your  blood glucose, blood pressure, and cholesterol levels.  Your activity level.  Other health conditions you have, such as heart or kidney disease. How do carbohydrates affect me? Carbohydrates, also called carbs, affect your blood glucose level more than any other type of food. Eating carbs naturally raises the amount of glucose in your blood. Carb counting is a method for keeping track of how many carbs you eat. Counting carbs is important to keep your blood glucose at a healthy level, especially if you use insulin or take certain oral diabetes medicines. It is important to know how many carbs you can safely have in each meal. This is different for every person. Your dietitian can help you calculate how many carbs you should have at each meal and for each snack. Foods that contain carbs include:  Bread, cereal, rice, pasta, and crackers.  Potatoes and corn.  Peas, beans, and lentils.  Milk and yogurt.  Fruit and juice.  Desserts, such as cakes, cookies, ice cream, and candy. How does alcohol affect me? Alcohol can cause a sudden decrease in blood glucose (hypoglycemia), especially if you use insulin or take certain oral diabetes medicines. Hypoglycemia can be a life-threatening condition. Symptoms of hypoglycemia (sleepiness, dizziness, and confusion) are similar to symptoms of having too much alcohol. If your health care provider says that alcohol is safe for you, follow these guidelines:  Limit alcohol intake to no more than 1 drink per day for nonpregnant women and 2 drinks per day for men. One drink equals 12 oz of beer, 5 oz of wine, or 1 oz of hard liquor.  Do not drink on an empty stomach.  Keep yourself hydrated with water, diet soda, or unsweetened iced tea.  Keep in mind that regular soda, juice, and other mixers may contain a lot of sugar and must be counted as carbs. What are tips for following this plan?  Reading food labels  Start by checking the serving size on the  "Nutrition Facts" label of packaged foods and drinks. The amount of calories, carbs, fats, and other nutrients listed on the label is based on one serving of the item. Many items contain more than one serving per package.  Check the total grams (g) of carbs in one serving. You can calculate the number of servings of carbs in one serving by dividing the total carbs by 15. For example, if a food has 30 g  of total carbs, it would be equal to 2 servings of carbs.  Check the number of grams (g) of saturated and trans fats in one serving. Choose foods that have low or no amount of these fats.  Check the number of milligrams (mg) of salt (sodium) in one serving. Most people should limit total sodium intake to less than 2,300 mg per day.  Always check the nutrition information of foods labeled as "low-fat" or "nonfat". These foods may be higher in added sugar or refined carbs and should be avoided.  Talk to your dietitian to identify your daily goals for nutrients listed on the label. Shopping  Avoid buying canned, premade, or processed foods. These foods tend to be high in fat, sodium, and added sugar.  Shop around the outside edge of the grocery store. This includes fresh fruits and vegetables, bulk grains, fresh meats, and fresh dairy. Cooking  Use low-heat cooking methods, such as baking, instead of high-heat cooking methods like deep frying.  Cook using healthy oils, such as olive, canola, or sunflower oil.  Avoid cooking with butter, cream, or high-fat meats. Meal planning  Eat meals and snacks regularly, preferably at the same times every day. Avoid going long periods of time without eating.  Eat foods high in fiber, such as fresh fruits, vegetables, beans, and whole grains. Talk to your dietitian about how many servings of carbs you can eat at each meal.  Eat 4-6 ounces (oz) of lean protein each day, such as lean meat, chicken, fish, eggs, or tofu. One oz of lean protein is equal to: ?  1 oz of meat, chicken, or fish. ? 1 egg. ?  cup of tofu.  Eat some foods each day that contain healthy fats, such as avocado, nuts, seeds, and fish. Lifestyle  Check your blood glucose regularly.  Exercise regularly as told by your health care provider. This may include: ? 150 minutes of moderate-intensity or vigorous-intensity exercise each week. This could be brisk walking, biking, or water aerobics. ? Stretching and doing strength exercises, such as yoga or weightlifting, at least 2 times a week.  Take medicines as told by your health care provider.  Do not use any products that contain nicotine or tobacco, such as cigarettes and e-cigarettes. If you need help quitting, ask your health care provider.  Work with a Social worker or diabetes educator to identify strategies to manage stress and any emotional and social challenges. Questions to ask a health care provider  Do I need to meet with a diabetes educator?  Do I need to meet with a dietitian?  What number can I call if I have questions?  When are the best times to check my blood glucose? Where to find more information:  American Diabetes Association: diabetes.org  Academy of Nutrition and Dietetics: www.eatright.[employer] of Diabetes and Digestive and Kidney Diseases (NIH): [employer] Summary  A healthy meal plan will help you control your blood glucose and maintain a healthy lifestyle.  Working with a diet and nutrition specialist (dietitian) can help you make a meal plan that is best for you.  Keep in mind that carbohydrates (carbs) and alcohol have immediate effects on your blood glucose levels. It is important to count carbs and to use alcohol carefully. This information is not intended to replace advice given to you by your health care provider. Make sure you discuss any questions you have with your health care provider. Document Released: 11/19/2004 Document Revised: 09/22/2016 Document  Reviewed: 03/29/2016  Chartered certified accountant Patient Education  2019 [employer].      Agustina Caroli, MD [facility]

## 2018-07-13 NOTE — Assessment & Plan Note (Signed)
Uncontrolled diabetes with hemoglobin A1c of 8.2.  Will increase glimepiride to 4 mg daily and add Januvia 50 mg/day.  Patient is intolerant to metformin.  SGLT medications would not covered by insurance.  Advised to follow diabetic diet and increase physical activity.  Taking a statin and ARB.  Follow-up in 3 months.

## 2018-07-13 NOTE — Patient Instructions (Addendum)
If you have lab work done today you will be contacted with your lab results within the next 2 weeks.  If you have not heard from [place] then please contact us. The fastest way to get your results is to register for My Chart.   IF you received an x-ray today, you will receive an invoice from [facility]. Please contact [facility] at [phone] with questions or concerns regarding your invoice.   IF you received labwork today, you will receive an invoice from [facility]. Please contact [facility] at [phone] with questions or concerns regarding your invoice.   Our billing staff will not be able to assist you with questions regarding bills from these companies.  You will be contacted with the lab results as soon as they are available. The fastest way to get your results is to activate your My Chart account. Instructions are located on the last page of this paperwork. If you have not heard from [place] regarding the results in 2 weeks, please contact this office.    We recommend that you schedule a mammogram for breast cancer screening. Typically, you do not need a referral to do this. Please contact a local imaging center to schedule your mammogram.  [facility] - [phone]  *ask for the Radiology [place] - [phone] or [phone]  [facility] - [phone] [place] [phone] [facility] - [phone]  *ask for the [facility] - [phone]  *ask for the Radiology Department [facility] - [phone]  *ask for the Mammography Department [facility] - [phone] Diabetes Mellitus and Nutrition, Adult When you have diabetes (diabetes mellitus), it is very important to have healthy eating habits because your blood sugar (glucose) levels are greatly affected by what you eat and drink. Eating healthy foods in  the appropriate amounts, at about the same times every day, can help you:  Control your blood glucose.  Lower your risk of heart disease.  Improve your blood pressure.  Reach or maintain a healthy weight. Every person with diabetes is different, and each person has different needs for a meal plan. Your health care provider may recommend that you work with a diet and nutrition specialist (dietitian) to make a meal plan that is best for you. Your meal plan may vary depending on factors such as:  The calories you need.  The medicines you take.  Your weight.  Your blood glucose, blood pressure, and cholesterol levels.  Your activity level.  Other health conditions you have, such as heart or kidney disease. How do carbohydrates affect me? Carbohydrates, also called carbs, affect your blood glucose level more than any other type of food. Eating carbs naturally raises the amount of glucose in your blood. Carb counting is a method for keeping track of how many carbs you eat. Counting carbs is important to keep your blood glucose at a healthy level, especially if you use insulin or take certain oral diabetes medicines. It is important to know how many carbs you can safely have in each meal. This is different for every person. Your dietitian can help you calculate how many carbs you should have at each meal and for each snack. Foods that contain carbs include:  Bread, cereal, rice, pasta, and crackers.  Potatoes and corn.  Peas, beans, and lentils.  Milk and yogurt.  Fruit and juice.  Desserts, such as cakes,  cookies, ice cream, and candy. How does alcohol affect me? Alcohol can cause a sudden decrease in blood glucose (hypoglycemia), especially if you use insulin or take certain oral diabetes medicines. Hypoglycemia can be a life-threatening condition. Symptoms of hypoglycemia (sleepiness, dizziness, and confusion) are similar to symptoms of having too much alcohol. If your health care  provider says that alcohol is safe for you, follow these guidelines:  Limit alcohol intake to no more than 1 drink per day for nonpregnant women and 2 drinks per day for men. One drink equals 12 oz of beer, 5 oz of wine, or 1 oz of hard liquor.  Do not drink on an empty stomach.  Keep yourself hydrated with water, diet soda, or unsweetened iced tea.  Keep in mind that regular soda, juice, and other mixers may contain a lot of sugar and must be counted as carbs. What are tips for following this plan?  Reading food labels  Start by checking the serving size on the "Nutrition Facts" label of packaged foods and drinks. The amount of calories, carbs, fats, and other nutrients listed on the label is based on one serving of the item. Many items contain more than one serving per package.  Check the total grams (g) of carbs in one serving. You can calculate the number of servings of carbs in one serving by dividing the total carbs by 15. For example, if a food has 30 g of total carbs, it would be equal to 2 servings of carbs.  Check the number of grams (g) of saturated and trans fats in one serving. Choose foods that have low or no amount of these fats.  Check the number of milligrams (mg) of salt (sodium) in one serving. Most people should limit total sodium intake to less than 2,300 mg per day.  Always check the nutrition information of foods labeled as "low-fat" or "nonfat". These foods may be higher in added sugar or refined carbs and should be avoided.  Talk to your dietitian to identify your daily goals for nutrients listed on the label. Shopping  Avoid buying canned, premade, or processed foods. These foods tend to be high in fat, sodium, and added sugar.  Shop around the outside edge of the grocery store. This includes fresh fruits and vegetables, bulk grains, fresh meats, and fresh dairy. Cooking  Use low-heat cooking methods, such as baking, instead of high-heat cooking methods like  deep frying.  Cook using healthy oils, such as olive, canola, or sunflower oil.  Avoid cooking with butter, cream, or high-fat meats. Meal planning  Eat meals and snacks regularly, preferably at the same times every day. Avoid going long periods of time without eating.  Eat foods high in fiber, such as fresh fruits, vegetables, beans, and whole grains. Talk to your dietitian about how many servings of carbs you can eat at each meal.  Eat 4-6 ounces (oz) of lean protein each day, such as lean meat, chicken, fish, eggs, or tofu. One oz of lean protein is equal to: ? 1 oz of meat, chicken, or fish. ? 1 egg. ?  cup of tofu.  Eat some foods each day that contain healthy fats, such as avocado, nuts, seeds, and fish. Lifestyle  Check your blood glucose regularly.  Exercise regularly as told by your health care provider. This may include: ? 150 minutes of moderate-intensity or vigorous-intensity exercise each week. This could be brisk walking, biking, or water aerobics. ? Stretching and doing strength exercises, such as  yoga or weightlifting, at least 2 times a week.  Take medicines as told by your health care provider.  Do not use any products that contain nicotine or tobacco, such as cigarettes and e-cigarettes. If you need help quitting, ask your health care provider.  Work with a Social worker or diabetes educator to identify strategies to manage stress and any emotional and social challenges. Questions to ask a health care provider  Do I need to meet with a diabetes educator?  Do I need to meet with a dietitian?  What number can I call if I have questions?  When are the best times to check my blood glucose? Where to find more information:  American Diabetes Association: diabetes.org  Academy of Nutrition and Dietetics: www.eatright.[employer] of Diabetes and Digestive and Kidney Diseases (NIH): [employer] Summary  A healthy meal plan will help you control  your blood glucose and maintain a healthy lifestyle.  Working with a diet and nutrition specialist (dietitian) can help you make a meal plan that is best for you.  Keep in mind that carbohydrates (carbs) and alcohol have immediate effects on your blood glucose levels. It is important to count carbs and to use alcohol carefully. This information is not intended to replace advice given to you by your health care provider. Make sure you discuss any questions you have with your health care provider. Document Released: 11/19/2004 Document Revised: 09/22/2016 Document Reviewed: 03/29/2016 Elsevier Interactive Patient Education  2019 [employer].

## 2018-07-14 ENCOUNTER — Encounter: Payer: Self-pay | Admitting: Psychiatry

## 2018-07-14 ENCOUNTER — Ambulatory Visit (INDEPENDENT_AMBULATORY_CARE_PROVIDER_SITE_OTHER): Payer: Medicare Other | Admitting: Psychiatry

## 2018-07-14 DIAGNOSIS — F411 Generalized anxiety disorder: Secondary | ICD-10-CM | POA: Diagnosis not present

## 2018-07-14 DIAGNOSIS — F5105 Insomnia due to other mental disorder: Secondary | ICD-10-CM

## 2018-07-14 DIAGNOSIS — F3160 Bipolar disorder, current episode mixed, unspecified: Secondary | ICD-10-CM | POA: Diagnosis not present

## 2018-07-14 LAB — COMPREHENSIVE METABOLIC PANEL
ALT: 19 IU/L (ref 0–32)
AST: 16 IU/L (ref 0–40)
Albumin/Globulin Ratio: 2 (ref 1.2–2.2)
Albumin: 4.3 g/dL (ref 3.8–4.9)
Alkaline Phosphatase: 94 IU/L (ref 39–117)
BUN/Creatinine Ratio: 23 (ref 12–28)
BUN: 17 mg/dL (ref 8–27)
Bilirubin Total: <0.2 mg/dL (ref 0.0–1.2)
CO2: 29 mmol/L (ref 20–29)
Calcium: 9.8 mg/dL (ref 8.7–10.3)
Chloride: 97 mmol/L (ref 96–106)
Creatinine, Ser: 0.73 mg/dL (ref 0.57–1.00)
GFR calc Af Amer: 104 mL/min/{1.73_m2} (ref >59)
GFR calc non Af Amer: 90 mL/min/{1.73_m2} (ref >59)
Globulin, Total: 2.2 g/dL (ref 1.5–4.5)
Glucose: 142 mg/dL — ABNORMAL HIGH (ref 65–99)
Potassium: 3.5 mmol/L (ref 3.5–5.2)
Sodium: 138 mmol/L (ref 134–144)
Total Protein: 6.5 g/dL (ref 6.0–8.5)

## 2018-07-14 LAB — LIPID PANEL
Chol/HDL Ratio: 2.7 ratio (ref 0.0–4.4)
Cholesterol, Total: 158 mg/dL (ref 100–199)
HDL: 58 mg/dL (ref >39)
LDL Calculated: 68 mg/dL (ref 0–99)
Triglycerides: 162 mg/dL — ABNORMAL HIGH (ref 0–149)
VLDL Cholesterol Cal: 32 mg/dL (ref 5–40)

## 2018-07-14 LAB — CBC WITH DIFFERENTIAL/PLATELET
Basophils Absolute: 0.1 10*3/uL (ref 0.0–0.2)
Basos: 1 %
EOS (ABSOLUTE): 0.3 10*3/uL (ref 0.0–0.4)
Eos: 3 %
Hematocrit: 42.4 % (ref 34.0–46.6)
Hemoglobin: 14.3 g/dL (ref 11.1–15.9)
Immature Grans (Abs): 0.1 10*3/uL (ref 0.0–0.1)
Immature Granulocytes: 1 %
Lymphocytes Absolute: 2.6 10*3/uL (ref 0.7–3.1)
Lymphs: 34 %
MCH: 28.8 pg (ref 26.6–33.0)
MCHC: 33.7 g/dL (ref 31.5–35.7)
MCV: 85 fL (ref 79–97)
Monocytes Absolute: 0.6 10*3/uL (ref 0.1–0.9)
Monocytes: 7 %
Neutrophils Absolute: 4.3 10*3/uL (ref 1.4–7.0)
Neutrophils: 54 %
Platelets: 252 10*3/uL (ref 150–450)
RBC: 4.97 x10E6/uL (ref 3.77–5.28)
RDW: 13.4 % (ref 11.7–15.4)
WBC: 7.8 10*3/uL (ref 3.4–10.8)

## 2018-07-14 LAB — THYROID PANEL WITH TSH
Free Thyroxine Index: 1.1 — ABNORMAL LOW (ref 1.2–4.9)
T3 Uptake Ratio: 22 % — ABNORMAL LOW (ref 24–39)
T4, Total: 4.9 ug/dL (ref 4.5–12.0)
TSH: 2.03 u[IU]/mL (ref 0.450–4.500)

## 2018-07-14 MED ORDER — PROPRANOLOL HCL 10 MG PO TABS: 10.0000 mg | ORAL_TABLET | Freq: Three times a day (TID) | ORAL | 1 refills | Status: DC | PRN

## 2018-07-14 NOTE — Progress Notes (Signed)
Virtual Visit via Video Note  I connected with Rachel Luna on 07/14/18 at  9:30 AM EDT by a video enabled telemedicine application and verified that I am speaking with the correct person using two identifiers.   I discussed the limitations of evaluation and management by telemedicine and the availability of in person appointments. The patient expressed understanding and agreed to proceed.   I discussed the assessment and treatment plan with the patient. The patient was provided an opportunity to ask questions and all were answered. The patient agreed with the plan and demonstrated an understanding of the instructions.   The patient was advised to call back or seek an in-person evaluation if the symptoms worsen or if the condition fails to improve as anticipated.  BH MD/PA/NP OP Progress Note  07/14/2018 1:12 PM Rachel Luna  MRN:  [id number]  Chief Complaint:  Chief Complaint    Follow-up     HPI: Rachel Luna is a 63 year old Caucasian female, divorced, disabled, lives in [place], has a history of bipolar disorder, generalized anxiety disorder, insomnia, seizure disorder, morbid obesity, chronic back pain, neuropathy was evaluated by telemedicine today.  Patient today reports she is currently spending some time with her father who is elderly.  He lives in [place].  She reports she and her dad are currently going for grocery shopping.  She has been making use of fall precautions to stay safe from the COVID-19.  Patient reports she continues to stay stable on the current medication regimen.  She is currently on Cymbalta and propranolol.  She reports her mood as okay.  She denies any significant anxiety, agitation.  She reports sleep is okay.  Patient continues to be in psychotherapy sessions with [name] which is going well.  Reports she establish care with a new family medicine provider yesterday.  She reports everything went well.  Patient denies any other concerns  today. Visit Diagnosis:    ICD-10-CM   1. Bipolar I disorder, most recent episode mixed ([place]) F31.60   2. GAD (generalized anxiety disorder) F41.1 propranolol (INDERAL) 10 MG tablet  3. Insomnia due to mental disorder F51.05     Past Psychiatric History: Reviewed past psychiatric history from my progress note on 10/19/2017.  Past trials of Abilify, Klonopin, Elavil, Cymbalta, Tegretol, Effexor, trazodone  Past Medical History:  Past Medical History:  Diagnosis Date  . Anxiety   . Arthritis   . Bipolar disorder ([place])   . chronic low back pain   . Chronic low back pain 02/18/2014  . Depression   . Diabetes mellitus without complication ([place])   . Diabetes mellitus, type II ([place])   . Fibroids   . GERD (gastroesophageal reflux disease)   . Gout   . Hyperlipidemia   . Hypertension   . Obesity     Past Surgical History:  Procedure Laterality Date  . ABDOMINAL HYSTERECTOMY    . CHOLECYSTECTOMY    . FOOT SURGERY Left   . KNEE ARTHROPLASTY    . OVARIAN CYST REMOVAL    . SPINE SURGERY      Family Psychiatric History: Reviewed family psychiatric history from my progress note on 10/19/2017  Family History:  Family History  Problem Relation Age of Onset  . Diabetes Mother   . Cancer Mother        breast  . Heart disease Mother   . Mental illness Mother        bipolar disorder  . Stroke Mother   . Breast  cancer Mother   . Hypertension Mother   . High Cholesterol Mother   . Diabetes Father   . Hypertension Father   . Diabetes Brother   . Bipolar disorder Brother   . Diabetes Brother   . Breast cancer Maternal Aunt   . Breast cancer Maternal Grandmother     Social History: Reviewed social history from my progress note on 10/19/2017 Social History   Socioeconomic History  . Marital status: Divorced    Spouse name: Not on file  . Number of children: 0  . Years of education: 10  . Highest education level: 10th grade  Occupational History    Employer: UNEMPLOYED   Social Needs  . Financial resource strain: Very hard  . Food insecurity:    Worry: Often true    Inability: Often true  . Transportation needs:    Medical: No    Non-medical: No  Tobacco Use  . Smoking status: Passive Smoke Exposure - Never Smoker  . Smokeless tobacco: Never Used  . Tobacco comment: room mate smokes  Substance and Sexual Activity  . Alcohol use: No    Alcohol/week: 0.0 standard drinks  . Drug use: No  . Sexual activity: Not Currently  Lifestyle  . Physical activity:    Days per week: 0 days    Minutes per session: 0 min  . Stress: Very much  Relationships  . Social connections:    Talks on phone: Three times a week    Gets together: Once a week    Attends religious service: 1 to 4 times per year    Active member of club or organization: No    Attends meetings of clubs or organizations: Never    Relationship status: Divorced  Other Topics Concern  . Not on file  Social History Narrative   Patient is divorced and lives with a roommate.   Patient is on disability.   Patient has a 10th grade education.   Patient is right-handed.   Patient drinks 6 sodas daily.          Allergies:  Allergies  Allergen Reactions  . Wellbutrin [Bupropion] Other (See Comments)    Irritability and Agitation  . Geodon [Ziprasidone Hcl] Other (See Comments)    Worsened Restless Leg Syndrome  . Lisinopril Cough  . Neupro [Rotigotine] Rash    Metabolic Disorder Labs: Lab Results  Component Value Date   HGBA1C 8.2 (A) 07/13/2018   No results found for: PROLACTIN Lab Results  Component Value Date   CHOL 158 07/13/2018   TRIG 162 (H) 07/13/2018   HDL 58 07/13/2018   CHOLHDL 2.7 07/13/2018   VLDL 41 (H) 03/25/2014   LDLCALC 68 07/13/2018   LDLCALC 104 (H) 03/25/2014   Lab Results  Component Value Date   TSH 2.030 07/13/2018    Therapeutic Level Labs: No results found for: LITHIUM No results found for: VALPROATE No components found for:  CBMZ  Current  Medications: Current Outpatient Medications  Medication Sig Dispense Refill  . ACCU-CHEK AVIVA PLUS test strip Use in the morning and after meals as needed. 100 each 5  . baclofen (LIORESAL) 10 MG tablet TAKE 1 TABLET BY MOUTH 3 TIMES DAILY. 270 each 1  . carbamazepine (TEGRETOL) 200 MG tablet TAKE 1/2 TABLET BY MOUTH ONCE DAILY 45 tablet 1  . cetirizine (ZYRTEC) 10 MG tablet Take 10 mg by mouth daily.    . clonazePAM (KLONOPIN) 0.5 MG tablet TAKE 1 TABLET BY MOUTH AT BEDTIME 30 tablet  3  . DULoxetine (CYMBALTA) 30 MG capsule Take 1 capsule (30 mg total) by mouth daily. To be combined with 60 mg 90 capsule 0  . DULoxetine (CYMBALTA) 60 MG capsule Take 1 capsule (60 mg total) by mouth daily. 90 capsule 1  . estradiol (ESTRACE) 0.5 MG tablet Take 1 tablet (0.5 mg total) by mouth daily. 30 tablet 11  . gabapentin (NEURONTIN) 800 MG tablet Take 1 tablet (800 mg total) by mouth 3 (three) times daily. 270 tablet 1  . glimepiride (AMARYL) 4 MG tablet Take 1 tablet (4 mg total) by mouth daily with breakfast. 90 tablet 1  . losartan-hydrochlorothiazide (HYZAAR) 100-25 MG tablet Take 1 tablet by mouth daily. 90 tablet 3  . meloxicam (MOBIC) 15 MG tablet Take 15 mg daily by mouth.     . pramipexole (MIRAPEX) 0.75 MG tablet TAKE 1 TABLET BY MOUTH ONCE EVERY MORNING AND 2 TABLET EVERY EVENING 270 tablet 2  . propranolol (INDERAL) 10 MG tablet Take 1 tablet (10 mg total) by mouth 3 (three) times daily as needed. For severe anxiety attacks only 90 tablet 1  . rosuvastatin (CRESTOR) 40 MG tablet Take 1 tablet by mouth daily.    . sitaGLIPtin (JANUVIA) 50 MG tablet Take 1 tablet (50 mg total) by mouth daily. 90 tablet 1   No current facility-administered medications for this visit.      Musculoskeletal: Strength & Muscle Tone: within normal limits Gait & Station: normal Patient leans: N/A  Psychiatric Specialty Exam: Review of Systems  Psychiatric/Behavioral: The patient is not nervous/anxious.   All  other systems reviewed and are negative.   There were no vitals taken for this visit.There is no height or weight on file to calculate BMI.  General Appearance: Casual  Eye Contact:  Fair  Speech:  Clear and Coherent  Volume:  Normal  Mood:  Euthymic  Affect:  Congruent  Thought Process:  Goal Directed and Descriptions of Associations: Intact  Orientation:  Full (Time, Place, and Person)  Thought Content: Logical   Suicidal Thoughts:  No  Homicidal Thoughts:  No  Memory:  Immediate;   Fair Recent;   Fair Remote;   Fair  Judgement:  Fair  Insight:  Fair  Psychomotor Activity:  Normal  Concentration:  Concentration: Fair and Attention Span: Fair  Recall:  [employer] of Knowledge: Fair  Language: Fair  Akathisia:  No  Handed:  Right  AIMS (if indicated): Denies tremors, rigidity, stiffness  Assets:  Communication Skills Desire for Improvement Social Support  ADL's:  Intact  Cognition: WNL  Sleep:  Fair   Screenings: PHQ2-9     Office Visit from 07/13/2018 in [facility] at [facility] Total Score  3  PHQ-9 Total Score  16       Assessment and Plan: Tameka is a 63 year old Caucasian female, divorced, on disability, lives in [place], has a history of bipolar disorder, anxiety disorder, insomnia, seizure disorder, neuropathy, chronic pain, morbid obesity was evaluated by telemedicine today.  Patient is currently doing well on the current medication regimen.  She continues to be in psychotherapy sessions which are going well.  Plan as noted below.  Plan for bipolar disorder-stable Cymbalta 90 mg p.o. daily Continue Tegretol 100 mg p.o. daily-prescribed by her neurologist for seizure.  She is currently being tapered down.  For anxiety disorder-stable Cymbalta 90 mg p.o. daily Propranolol 10 mg p.o. 3 times daily as needed.  For insomnia-stable Continue melatonin. She also has Klonopin and  Mirapex per her other provider for restless leg syndrome.  Continue  psychotherapy sessions with [name].  Follow-up in clinic in 2 months or sooner if needed.  Appointment scheduled for July 7 at 4 PM  I have spent atleast 15 minutes non face to face with patient today. More than 50 % of the time was spent for psychoeducation and supportive psychotherapy and care coordination.  This note was generated in part or whole with voice recognition software. Voice recognition is usually quite accurate but there are transcription errors that can and very often do occur. I apologize for any typographical errors that were not detected and corrected.        Ursula Alert, MD 07/14/2018, 1:12 PM

## 2018-08-14 ENCOUNTER — Telehealth: Payer: Self-pay | Admitting: Neurology

## 2018-08-14 NOTE — Telephone Encounter (Signed)
Due to current COVID 19 pandemic, our office is severely reducing in office visits until further notice, in order to minimize the risk to our patients and healthcare providers.   Called patient to offer virtual visit for 6/11. Patient accepted and verbalized understanding of the doxy process. I have sent patient an e-mail with link and instructions, as well as my name and office number/hours. Patient understands that she will receive a call from RN prior to appt.  Pt understands that although there may be some limitations with this type of visit, we will take all precautions to reduce any security or privacy concerns.  Pt understands that this will be treated like an in office visit and we will file with pt's insurance, and there may be a patient responsible charge related to this service.

## 2018-08-16 ENCOUNTER — Other Ambulatory Visit: Payer: Self-pay | Admitting: Neurology

## 2018-08-16 NOTE — Telephone Encounter (Signed)
I contacted the pt and completed the pre charting for 08/17/18 virtual visit. Pt confirmed she received e-mail link.

## 2018-08-17 ENCOUNTER — Encounter: Payer: Self-pay | Admitting: Neurology

## 2018-08-17 ENCOUNTER — Other Ambulatory Visit: Payer: Self-pay

## 2018-08-17 ENCOUNTER — Ambulatory Visit (INDEPENDENT_AMBULATORY_CARE_PROVIDER_SITE_OTHER): Payer: Medicare Other | Admitting: Neurology

## 2018-08-17 DIAGNOSIS — G2581 Restless legs syndrome: Secondary | ICD-10-CM | POA: Diagnosis not present

## 2018-08-17 DIAGNOSIS — M545 Low back pain: Secondary | ICD-10-CM

## 2018-08-17 DIAGNOSIS — G40909 Epilepsy, unspecified, not intractable, without status epilepticus: Secondary | ICD-10-CM | POA: Diagnosis not present

## 2018-08-17 DIAGNOSIS — G8929 Other chronic pain: Secondary | ICD-10-CM

## 2018-08-17 MED ORDER — CLONAZEPAM 0.5 MG PO TABS: 0.5000 mg | ORAL_TABLET | Freq: Every day | ORAL | 1 refills | Status: DC

## 2018-08-17 NOTE — Progress Notes (Signed)
     Virtual Visit via Video Note  I connected with [name] on 08/17/18 at  8:00 AM EDT by a video enabled telemedicine application and verified that I am speaking with the correct person using two identifiers.  Location: Patient: The patient is at home. Provider: Physician in office.   I discussed the limitations of evaluation and management by telemedicine and the availability of in person appointments. The patient expressed understanding and agreed to proceed.  History of Present Illness: Rachel Luna is a 63 year old right-handed white female with a history of bipolar disorder, chronic low back pain, restless leg syndrome, remote history of seizures and obesity.  The patient has had a recent increase in her low back pain as she is caring for her demented father, he is hallucinating and agitated at night, she is not sleeping well.  She is also helping a friend with moving, packing boxes.  This has increased her pain recently.  In regards to her restless leg syndrome, the recent addition of clonazepam 0.5 mg at night has been helpful, she is sleeping better, she also takes Mirapex 0.75 mg tablets taking 1 in the morning and 2 in the evening.  The patient remains on carbamazepine and gabapentin.  She otherwise is doing well with her medical issues.   Observations/Objective: Virtual evaluation reveals that the patient is obese.  She is alert and cooperative.  Speech is well enunciated, not aphasic or dysarthric.  She is able to protrude the tongue midline with good lateral movement of the tongue.  She has good facial symmetry.  Extraocular movements are full.  She has good finger-nose-finger and heel shin bilaterally.  Gait is normal.  Tandem gait is minimally unsteady.  Romberg is negative.  No drift is seen.  Assessment and Plan: 1.  Chronic low back pain  2.  Restless leg syndrome  3.  Remote history of seizures  4.  Possible small fiber neuropathy  The patient will continue her  current medications, she remains on baclofen, carbamazepine, gabapentin, Mirapex, and clonazepam.  She will follow-up in 6 months.  We will consider checking a carbamazepine level at that time.  She has just had blood work to include a comprehensive metabolic profile and CBC and differential in May 2020.  A prescription was sent in for her clonazepam.  Follow Up Instructions: 56-month follow-up, may see nurse practitioner.   I discussed the assessment and treatment plan with the patient. The patient was provided an opportunity to ask questions and all were answered. The patient agreed with the plan and demonstrated an understanding of the instructions.   The patient was advised to call back or seek an in-person evaluation if the symptoms worsen or if the condition fails to improve as anticipated.  I provided 20 minutes of non-face-to-face time during this encounter.   Kathrynn Ducking, MD

## 2018-09-12 ENCOUNTER — Ambulatory Visit (INDEPENDENT_AMBULATORY_CARE_PROVIDER_SITE_OTHER): Payer: Medicare Other | Admitting: Psychiatry

## 2018-09-12 ENCOUNTER — Other Ambulatory Visit: Payer: Self-pay

## 2018-09-12 DIAGNOSIS — Z5329 Procedure and treatment not carried out because of patient's decision for other reasons: Secondary | ICD-10-CM

## 2018-09-12 NOTE — Progress Notes (Signed)
Unable to complete call - tried multiple times. Also sent text - [email or web address] link - no response.

## 2018-09-14 ENCOUNTER — Ambulatory Visit (INDEPENDENT_AMBULATORY_CARE_PROVIDER_SITE_OTHER): Payer: Medicare Other | Admitting: Psychiatry

## 2018-09-14 ENCOUNTER — Encounter: Payer: Self-pay | Admitting: Psychiatry

## 2018-09-14 ENCOUNTER — Other Ambulatory Visit: Payer: Self-pay

## 2018-09-14 DIAGNOSIS — F5105 Insomnia due to other mental disorder: Secondary | ICD-10-CM | POA: Insufficient documentation

## 2018-09-14 DIAGNOSIS — F411 Generalized anxiety disorder: Secondary | ICD-10-CM | POA: Diagnosis not present

## 2018-09-14 DIAGNOSIS — F3160 Bipolar disorder, current episode mixed, unspecified: Secondary | ICD-10-CM | POA: Insufficient documentation

## 2018-09-14 MED ORDER — DULOXETINE HCL 30 MG PO CPEP: 30.0000 mg | ORAL_CAPSULE | Freq: Every day | ORAL | 0 refills | Status: DC

## 2018-09-14 MED ORDER — DULOXETINE HCL 60 MG PO CPEP: 60.0000 mg | ORAL_CAPSULE | Freq: Every day | ORAL | 1 refills | Status: DC

## 2018-09-14 MED ORDER — OLANZAPINE 2.5 MG PO TABS: 2.5000 mg | ORAL_TABLET | Freq: Every day | ORAL | 0 refills | Status: DC

## 2018-09-14 NOTE — Progress Notes (Signed)
Virtual Visit via Video Note  I connected with Rachel Luna on 09/14/18 at 10:45 AM EDT by a video enabled telemedicine application and verified that I am speaking with the correct person using two identifiers.   I discussed the limitations of evaluation and management by telemedicine and the availability of in person appointments. The patient expressed understanding and agreed to proceed.   I discussed the assessment and treatment plan with the patient. The patient was provided an opportunity to ask questions and all were answered. The patient agreed with the plan and demonstrated an understanding of the instructions.   The patient was advised to call back or seek an in-person evaluation if the symptoms worsen or if the condition fails to improve as anticipated.   [place] MD OP Progress Note  09/14/2018 7:50 PM Rachel Luna  MRN:  [id number]  Chief Complaint:  Chief Complaint    Follow-up     HPI: Rachel Luna is a 63 year old Caucasian female, divorced, disabled, lives in [place], has a history of bipolar disorder, GAD, insomnia, seizure disorder, morbid obesity, chronic pain, neuropathy was evaluated by telemedicine today.  Patient reports she is struggling with some mood lability.  She reports she is unable to focus, has a lot of anxiety, is unable to take care of all her home due to her mood swings.  Patient reports she is compliant on her medications.  She denies any side effects.  Patient also reports some restlessness at night with her sleep.  Patient continues to be in psychotherapy sessions with [name].  She reports therapy sessions is going well.  Patient reports she is stressed out about her health issues, COVID-19 outbreak as well as her father who struggles with dementia and needs her help.  Discussed with her that collateral information was obtained from [name] as well as medical records from Miguel Dibble were reviewed.  Per Otila Kluver patient continues to be  depressed due to her multiple stressors, is easily worried, overwhelmed and has irritability.' Visit Diagnosis:    ICD-10-CM   1. Bipolar I disorder, most recent episode mixed (HCC)  F31.60 OLANZapine (ZYPREXA) 2.5 MG tablet    DULoxetine (CYMBALTA) 30 MG capsule    DULoxetine (CYMBALTA) 60 MG capsule  2. GAD (generalized anxiety disorder)  F41.1 DULoxetine (CYMBALTA) 30 MG capsule    DULoxetine (CYMBALTA) 60 MG capsule  3. Insomnia due to mental disorder  F51.05     Past Psychiatric History: I have reviewed past psychiatric history from my progress note on 10/19/2017.  Abilify, Klonopin, Elavil, Cymbalta, Tegretol, Effexor, trazodone  Past Medical History:  Past Medical History:  Diagnosis Date  . Anxiety   . Arthritis   . Bipolar disorder ([place])   . chronic low back pain   . Chronic low back pain 02/18/2014  . Depression   . Diabetes mellitus without complication ([place])   . Diabetes mellitus, type II ([place])   . Fibroids   . GERD (gastroesophageal reflux disease)   . Gout   . Hyperlipidemia   . Hypertension   . Obesity     Past Surgical History:  Procedure Laterality Date  . ABDOMINAL HYSTERECTOMY    . CHOLECYSTECTOMY    . FOOT SURGERY Left   . KNEE ARTHROPLASTY    . OVARIAN CYST REMOVAL    . SPINE SURGERY      Family Psychiatric History: I have reviewed family psychiatric history from my progress note on 10/19/2017. Family History:  Family History  Problem  Relation Age of Onset  . Diabetes Mother   . Cancer Mother        breast  . Heart disease Mother   . Mental illness Mother        bipolar disorder  . Stroke Mother   . Breast cancer Mother   . Hypertension Mother   . High Cholesterol Mother   . Diabetes Father   . Hypertension Father   . Diabetes Brother   . Bipolar disorder Brother   . Diabetes Brother   . Breast cancer Maternal Aunt   . Breast cancer Maternal Grandmother     Social History: I have reviewed social history from my progress note on  10/19/2017. Social History   Socioeconomic History  . Marital status: Divorced    Spouse name: Not on file  . Number of children: 0  . Years of education: 10  . Highest education level: 10th grade  Occupational History    Employer: UNEMPLOYED  Social Needs  . Financial resource strain: Very hard  . Food insecurity    Worry: Often true    Inability: Often true  . Transportation needs    Medical: No    Non-medical: No  Tobacco Use  . Smoking status: Passive Smoke Exposure - Never Smoker  . Smokeless tobacco: Never Used  . Tobacco comment: room mate smokes  Substance and Sexual Activity  . Alcohol use: No    Alcohol/week: 0.0 standard drinks  . Drug use: No  . Sexual activity: Not Currently  Lifestyle  . Physical activity    Days per week: 0 days    Minutes per session: 0 min  . Stress: Very much  Relationships  . Social Herbalist on phone: Three times a week    Gets together: Once a week    Attends religious service: 1 to 4 times per year    Active member of club or organization: No    Attends meetings of clubs or organizations: Never    Relationship status: Divorced  Other Topics Concern  . Not on file  Social History Narrative   Patient is divorced and lives with a roommate.   Patient is on disability.   Patient has a 10th grade education.   Patient is right-handed.   Patient drinks 6 sodas daily.          Allergies:  Allergies  Allergen Reactions  . Wellbutrin [Bupropion] Other (See Comments)    Irritability and Agitation  . Geodon [Ziprasidone Hcl] Other (See Comments)    Worsened Restless Leg Syndrome  . Lisinopril Cough  . Neupro [Rotigotine] Rash    Metabolic Disorder Labs: Lab Results  Component Value Date   HGBA1C 8.2 (A) 07/13/2018   No results found for: PROLACTIN Lab Results  Component Value Date   CHOL 158 07/13/2018   TRIG 162 (H) 07/13/2018   HDL 58 07/13/2018   CHOLHDL 2.7 07/13/2018   VLDL 41 (H) 03/25/2014    LDLCALC 68 07/13/2018   LDLCALC 104 (H) 03/25/2014   Lab Results  Component Value Date   TSH 2.030 07/13/2018    Therapeutic Level Labs: No results found for: LITHIUM No results found for: VALPROATE No components found for:  CBMZ  Current Medications: Current Outpatient Medications  Medication Sig Dispense Refill  . ACCU-CHEK AVIVA PLUS test strip Use in the morning and after meals as needed. 100 each 5  . baclofen (LIORESAL) 10 MG tablet TAKE 1 TABLET BY MOUTH 3 TIMES DAILY  270 each 1  . carbamazepine (TEGRETOL) 200 MG tablet TAKE 1/2 TABLET BY MOUTH ONCE DAILY 45 tablet 1  . cetirizine (ZYRTEC) 10 MG tablet Take 10 mg by mouth daily.    . clonazePAM (KLONOPIN) 0.5 MG tablet Take 1 tablet (0.5 mg total) by mouth at bedtime. 90 tablet 1  . DULoxetine (CYMBALTA) 30 MG capsule Take 1 capsule (30 mg total) by mouth daily. To be combined with 60 mg 90 capsule 0  . DULoxetine (CYMBALTA) 60 MG capsule Take 1 capsule (60 mg total) by mouth daily. 90 capsule 1  . estradiol (ESTRACE) 0.5 MG tablet Take 1 tablet (0.5 mg total) by mouth daily. 30 tablet 11  . gabapentin (NEURONTIN) 800 MG tablet Take 1 tablet (800 mg total) by mouth 3 (three) times daily. 270 tablet 1  . glimepiride (AMARYL) 4 MG tablet Take 1 tablet (4 mg total) by mouth daily with breakfast. 90 tablet 1  . losartan-hydrochlorothiazide (HYZAAR) 100-25 MG tablet Take 1 tablet by mouth daily. 90 tablet 3  . meloxicam (MOBIC) 15 MG tablet Take 15 mg daily by mouth.     . OLANZapine (ZYPREXA) 2.5 MG tablet Take 1 tablet (2.5 mg total) by mouth at bedtime. 90 tablet 0  . pramipexole (MIRAPEX) 0.75 MG tablet TAKE 1 TABLET BY MOUTH ONCE EVERY MORNING AND 2 TABLET EVERY EVENING 270 tablet 2  . propranolol (INDERAL) 10 MG tablet Take 1 tablet (10 mg total) by mouth 3 (three) times daily as needed. For severe anxiety attacks only 90 tablet 1  . rosuvastatin (CRESTOR) 40 MG tablet Take 1 tablet by mouth daily.    . sitaGLIPtin (JANUVIA)  50 MG tablet Take 1 tablet (50 mg total) by mouth daily. 90 tablet 1   No current facility-administered medications for this visit.      Musculoskeletal: Strength & Muscle Tone: within normal limits Gait & Station: normal Patient leans: N/A  Psychiatric Specialty Exam: Review of Systems  Psychiatric/Behavioral: Positive for depression. The patient is nervous/anxious.   All other systems reviewed and are negative.   There were no vitals taken for this visit.There is no height or weight on file to calculate BMI.  General Appearance: Casual  Eye Contact:  Good  Speech:  Clear and Coherent  Volume:  Normal  Mood:  Anxious and Depressed  Affect:  Appropriate  Thought Process:  Goal Directed and Descriptions of Associations: Intact  Orientation:  Full (Time, Place, and Person)  Thought Content: Logical   Suicidal Thoughts:  No  Homicidal Thoughts:  No  Memory:  Immediate;   Fair Recent;   Good Remote;   Good  Judgement:  Fair  Insight:  Fair  Psychomotor Activity:  Normal  Concentration:  Concentration: Fair and Attention Span: Fair  Recall:  [employer] of Knowledge: Fair  Language: Fair  Akathisia:  No  Handed:  Right  AIMS (if indicated): denies tremors, rigidity  Assets:  Communication Skills Desire for Improvement Housing Social Support  ADL's:  Intact  Cognition: WNL  Sleep:  restless   Screenings: PHQ2-9     Office Visit from 07/13/2018 [facility] Total Score  3  PHQ-9 Total Score  16       Assessment and Plan: Rachel Luna is a 64 year old Caucasian female, divorced, disabled, lives in [place], has a history of bipolar disorder, anxiety disorder, insomnia, seizure disorder neuropathy, chronic pain, morbid obesity was evaluated by telemedicine today.  Patient is currently struggling with mood  lability and will benefit from medication readjustment.  She does have psychosocial stressors of current COVID-19 outbreak, her father's health issues.   Patient also will continue to benefit from psychotherapy sessions.  Plan Bipolar disorder-unstable Cymbalta 90 mg p.o. daily Tegretol 100 mg p.o. daily-prescribed by her neurologist for seizures.  He is currently being tapered down. Add Zyprexa 2.5 mg p.o. nightly  For anxiety disorder-unstable Cymbalta as prescribed Propranolol 10 mg p.o. 3 times daily as needed  CBT with [name]  For insomnia- restless Zyprexa will help. Continue melatonin. She also has Klonopin and Mirapex for her other provider for restless leg syndrome  I have reviewed and discussed medical records received from [name]- dated August 29, 2018 as summarized above.  Follow-up in clinic in 4 weeks or sooner if needed.  September 1 at 10 AM.  I have spent atleast 25 minutes non face to face with patient today. More than 50 % of the time was spent for psychoeducation and supportive psychotherapy and care coordination.  This note was generated in part or whole with voice recognition software. Voice recognition is usually quite accurate but there are transcription errors that can and very often do occur. I apologize for any typographical errors that were not detected and corrected.       Ursula Alert, MD 09/14/2018, 7:50 PM

## 2018-09-19 ENCOUNTER — Telehealth: Payer: Self-pay

## 2018-09-19 ENCOUNTER — Other Ambulatory Visit (HOSPITAL_COMMUNITY): Payer: Self-pay | Admitting: Psychiatry

## 2018-09-19 DIAGNOSIS — F411 Generalized anxiety disorder: Secondary | ICD-10-CM

## 2018-09-19 MED ORDER — PROPRANOLOL HCL 10 MG PO TABS: 10.0000 mg | ORAL_TABLET | Freq: Three times a day (TID) | ORAL | 1 refills | Status: DC | PRN

## 2018-09-19 NOTE — Telephone Encounter (Signed)
ordered

## 2018-09-19 NOTE — Telephone Encounter (Signed)
Received a fax requesting a refill on the propranolol   Pt has appt 11-07-18  propranolol (INDERAL) 10 MG tablet Medication Date: 07/14/2018 Department: [facility] Ordering/Authorizing: Ursula Alert, MD  Order Providers  Prescribing Provider Encounter Provider  Ursula Alert, MD Ursula Alert, MD  Outpatient Medication Detail   Disp Refills Start End   propranolol (INDERAL) 10 MG tablet 90 tablet 1 07/14/2018    Sig - Route: Take 1 tablet (10 mg total) by mouth 3 (three) times daily as needed. For severe anxiety attacks only - Oral   Sent to pharmacy as: propranolol (INDERAL) 10 MG tablet   E-Prescribing Status: Receipt confirmed by pharmacy (07/14/2018 9:23 AM EDT)   Associated Diagnoses  GAD (generalized anxiety disorder)     Pharmacy  [place], [place].

## 2018-10-16 ENCOUNTER — Other Ambulatory Visit: Payer: Self-pay

## 2018-10-16 ENCOUNTER — Encounter: Payer: Self-pay | Admitting: Emergency Medicine

## 2018-10-16 ENCOUNTER — Ambulatory Visit (INDEPENDENT_AMBULATORY_CARE_PROVIDER_SITE_OTHER): Payer: Medicare Other | Admitting: Emergency Medicine

## 2018-10-16 VITALS — BP 124/76 | HR 73 | Temp 98.5°F | Resp 16 | Ht 64.0 in | Wt 280.0 lb

## 2018-10-16 DIAGNOSIS — I10 Essential (primary) hypertension: Secondary | ICD-10-CM | POA: Diagnosis not present

## 2018-10-16 DIAGNOSIS — Z23 Encounter for immunization: Secondary | ICD-10-CM

## 2018-10-16 DIAGNOSIS — E1165 Type 2 diabetes mellitus with hyperglycemia: Secondary | ICD-10-CM

## 2018-10-16 LAB — POCT GLYCOSYLATED HEMOGLOBIN (HGB A1C): Hemoglobin A1C: 7.9 % — AB (ref 4.0–5.6)

## 2018-10-16 LAB — GLUCOSE, POCT (MANUAL RESULT ENTRY): POC Glucose: 117 mg/dl — AB (ref 70–99)

## 2018-10-16 NOTE — Progress Notes (Signed)
Lab Results  Component Value Date   HGBA1C 8.2 (A) 07/13/2018   BP Readings from Last 3 Encounters:  10/16/18 124/76  07/13/18 (!) 142/83  01/25/18 (!) 164/90   Lab Results  Component Value Date   CHOL 158 07/13/2018   HDL 58 07/13/2018   LDLCALC 68 07/13/2018   TRIG 162 (H) 07/13/2018   CHOLHDL 2.7 07/13/2018   Wt Readings from Last 3 Encounters:  10/16/18 280 lb (127 kg)  07/13/18 276 lb (125.2 kg)  01/25/18 269 lb (122 kg)   Rachel Luna 63 y.o.   Chief Complaint  Patient presents with  . Diabetes    3 month follow up    HISTORY OF PRESENT ILLNESS: This is a 63 y.o. female with history of diabetes here for follow-up.  Doing well has no complaints or medical concerns today. On Januvia and glimepiride.  Intolerant to metformin.  Compliant with medications.  HPI   Prior to Admission medications   Medication Sig Start Date End Date Taking? Authorizing Provider  baclofen (LIORESAL) 10 MG tablet TAKE 1 TABLET BY MOUTH 3 TIMES DAILY 08/16/18  Yes Kathrynn Ducking, MD  carbamazepine (TEGRETOL) 200 MG tablet TAKE 1/2 TABLET BY MOUTH ONCE DAILY 06/06/18  Yes Kathrynn Ducking, MD  cetirizine (ZYRTEC) 10 MG tablet Take 10 mg by mouth daily.   Yes [provider]  clonazePAM (KLONOPIN) 0.5 MG tablet Take 1 tablet (0.5 mg total) by mouth at bedtime. 08/17/18  Yes Kathrynn Ducking, MD  DULoxetine (CYMBALTA) 30 MG capsule Take 1 capsule (30 mg total) by mouth daily. To be combined with 60 mg 09/14/18  Yes Eappen, Ria Clock, MD  DULoxetine (CYMBALTA) 60 MG capsule Take 1 capsule (60 mg total) by mouth daily. 09/14/18  Yes Ursula Alert, MD  estradiol (ESTRACE) 0.5 MG tablet Take 1 tablet (0.5 mg total) by mouth daily. 10/10/17  Yes Shelly Bombard, MD  meloxicam (MOBIC) 15 MG tablet Take 15 mg daily by mouth.  07/04/15  Yes [provider]  propranolol (INDERAL) 10 MG tablet Take 1 tablet (10 mg total) by mouth 3 (three) times daily as needed. For severe anxiety  attacks only 09/19/18  Yes Hisada, Elie Goody, MD  rosuvastatin (CRESTOR) 40 MG tablet Take 1 tablet by mouth daily. 03/23/16  Yes [provider]  ACCU-CHEK AVIVA PLUS test strip Use in the morning and after meals as needed. 07/13/18   Horald Pollen, MD  gabapentin (NEURONTIN) 800 MG tablet Take 1 tablet (800 mg total) by mouth 3 (three) times daily. 07/13/18 10/11/18  Horald Pollen, MD  glimepiride (AMARYL) 4 MG tablet Take 1 tablet (4 mg total) by mouth daily with breakfast. 07/13/18 10/11/18  Horald Pollen, MD  losartan-hydrochlorothiazide (HYZAAR) 100-25 MG tablet Take 1 tablet by mouth daily. 07/13/18 10/11/18  Horald Pollen, MD  OLANZapine (ZYPREXA) 2.5 MG tablet Take 1 tablet (2.5 mg total) by mouth at bedtime. Patient not taking: Reported on 10/16/2018 09/14/18   Ursula Alert, MD  pramipexole (MIRAPEX) 0.75 MG tablet TAKE 1 TABLET BY MOUTH ONCE EVERY MORNING AND 2 TABLET EVERY EVENING Patient not taking: Reported on 10/16/2018 06/05/18   Kathrynn Ducking, MD  sitaGLIPtin (JANUVIA) 50 MG tablet Take 1 tablet (50 mg total) by mouth daily. 07/13/18 10/11/18  Horald Pollen, MD    Allergies  Allergen Reactions  . Wellbutrin [Bupropion] Other (See Comments)    Irritability and Agitation  . Geodon [Ziprasidone Hcl] Other (See Comments)  Worsened Restless Leg Syndrome  . Lisinopril Cough  . Neupro [Rotigotine] Rash    Patient Active Problem List   Diagnosis Date Noted  . Bipolar I disorder, most recent episode mixed ([place]) 09/14/2018  . GAD (generalized anxiety disorder) 09/14/2018  . 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 Type 2 diabetes mellitus with hyperglycemia, without long-term current use of insulin ([place]) 07/13/2018  . Seizure disorder ([place]) 10/19/2017  . 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 ANA positive 03/05/2015  . Elevated rheumatoid factor 03/05/2015  . Avitaminosis D 09/04/2014  . Chronic low back pain 02/18/2014  . Menopausal  hot flushes 08/14/2012  . Arthritis of knee, degenerative 04/06/2011  . Benign essential HTN 07/13/2010  . Chronic pain 01/08/2010  . Affective disorder, major 11/05/2009  . Major depressive disorder 11/05/2009    Past Medical History:  Diagnosis Date  . Anxiety   . Arthritis   . Bipolar disorder ([place])   . chronic low back pain   . Chronic low back pain 02/18/2014  . Depression   . Diabetes mellitus without complication ([place])   . Diabetes mellitus, type II ([place])   . Fibroids   . GERD (gastroesophageal reflux disease)   . Gout   . Hyperlipidemia   . Hypertension   . Obesity     Past Surgical History:  Procedure Laterality Date  . ABDOMINAL HYSTERECTOMY    . CHOLECYSTECTOMY    . FOOT SURGERY Left   . KNEE ARTHROPLASTY    . OVARIAN CYST REMOVAL    . SPINE SURGERY      Social History   Socioeconomic History  . Marital status: Divorced    Spouse name: Not on file  . Number of children: 0  . Years of education: 10  . Highest education level: 10th grade  Occupational History    Employer: UNEMPLOYED  Social Needs  . Financial resource strain: Very hard  . Food insecurity    Worry: Often true    Inability: Often true  . Transportation needs    Medical: No    Non-medical: No  Tobacco Use  . Smoking status: Passive Smoke Exposure - Never Smoker  . Smokeless tobacco: Never Used  . Tobacco comment: room mate smokes  Substance and Sexual Activity  . Alcohol use: No    Alcohol/week: 0.0 standard drinks  . Drug use: No  . Sexual activity: Not Currently  Lifestyle  . Physical activity    Days per week: 0 days    Minutes per session: 0 min  . Stress: Very much  Relationships  . Social Herbalist on phone: Three times a week    Gets together: Once a week    Attends religious service: 1 to 4 times per year    Active member of club or organization: No    Attends meetings of clubs or organizations: Never    Relationship status: Divorced  . Intimate  partner violence    Fear of current or ex partner: No    Emotionally abused: No    Physically abused: No    Forced sexual activity: No  Other Topics Concern  . Not on file  Social History Narrative   Patient is divorced and lives with a roommate.   Patient is on disability.   Patient has a 10th grade education.   Patient is right-handed.   Patient drinks 6 sodas daily.          Family History  Problem  Relation Age of Onset  . Diabetes Mother   . Cancer Mother        breast  . Heart disease Mother   . Mental illness Mother        bipolar disorder  . Stroke Mother   . Breast cancer Mother   . Hypertension Mother   . High Cholesterol Mother   . Diabetes Father   . Hypertension Father   . Diabetes Brother   . Bipolar disorder Brother   . Diabetes Brother   . Breast cancer Maternal Aunt   . Breast cancer Maternal Grandmother      Review of Systems  Constitutional: Negative.  Negative for chills and fever.  HENT: Negative.  Negative for congestion and sore throat.   Eyes: Negative.   Respiratory: Negative.  Negative for cough and shortness of breath.   Cardiovascular: Negative.  Negative for chest pain and palpitations.  Gastrointestinal: Negative.  Negative for abdominal pain, diarrhea, nausea and vomiting.  Genitourinary: Negative.   Skin: Negative.   Neurological: Negative.  Negative for dizziness and headaches.  Endo/Heme/Allergies: Negative.   All other systems reviewed and are negative.    Today's Vitals   10/16/18 0843  BP: 124/76  Pulse: 73  Resp: 16  Temp: 98.5 F (36.9 C)  TempSrc: Oral  SpO2: 95%  Weight: 280 lb (127 kg)  Height: 5\' 4"  (1.626 m)   Body mass index is 48.06 kg/m.   Physical Exam Vitals signs reviewed.  Constitutional:      Appearance: Normal appearance.  HENT:     Head: Normocephalic.  Eyes:     Extraocular Movements: Extraocular movements intact.     Conjunctiva/sclera: Conjunctivae normal.     Pupils: Pupils are  equal, round, and reactive to light.  Neck:     Musculoskeletal: Normal range of motion and neck supple.  Cardiovascular:     Rate and Rhythm: Normal rate and regular rhythm.     Pulses: Normal pulses.     Heart sounds: Normal heart sounds.  Pulmonary:     Effort: Pulmonary effort is normal.     Breath sounds: Normal breath sounds.  Musculoskeletal: Normal range of motion.  Skin:    General: Skin is warm and dry.  Neurological:     General: No focal deficit present.     Mental Status: She is alert and oriented to person, place, and time.  Psychiatric:        Mood and Affect: Mood normal.        Behavior: Behavior normal.    Results for orders placed or performed in visit on 10/16/18 (from the past 24 hour(s))  POCT glucose (manual entry)     Status: Abnormal   Collection Time: 10/16/18  9:18 AM  Result Value Ref Range   POC Glucose 117 (A) 70 - 99 mg/dl  POCT glycosylated hemoglobin (Hb A1C)     Status: Abnormal   Collection Time: 10/16/18  9:24 AM  Result Value Ref Range   Hemoglobin A1C 7.9 (A) 4.0 - 5.6 %   HbA1c POC (<> result, manual entry)     HbA1c, POC (prediabetic range)     HbA1c, POC (controlled diabetic range)       ASSESSMENT & PLAN: Type 2 diabetes mellitus with hyperglycemia, without long-term current use of insulin ([place]) Uncontrolled diabetes but better than before.  Hemoglobin A1c down to 7.9 from 8.2.  Continue present medications, Januvia and glimepiride, with diet and exercise.  Follow-up in 6 months.  Benign essential HTN Well-controlled blood pressure.  Continue Hyzaar 100-25 daily.  Follow-up in 6 months.  Deashia was seen today for diabetes.  Diagnoses and all orders for this visit:  Type 2 diabetes mellitus with hyperglycemia, without long-term current use of insulin ([facility]) -     POCT glycosylated hemoglobin (Hb A1C) -     POCT glucose (manual entry) -     HM Diabetes Foot Exam -     CBC with Differential/Platelet -     Comprehensive  metabolic panel -     Lipid panel -     Pneumococcal polysaccharide vaccine 23-valent greater than or equal to 2yo subcutaneous/IM  Need for prophylactic vaccination against Streptococcus pneumoniae (pneumococcus)  Benign essential HTN    Patient Instructions       If you have lab work done today you will be contacted with your lab results within the next 2 weeks.  If you have not heard from [place] then please contact us. The fastest way to get your results is to register for My Chart.   IF you received an x-ray today, you will receive an invoice from [facility]. Please contact [facility] at [phone] with questions or concerns regarding your invoice.   IF you received labwork today, you will receive an invoice from [facility]. Please contact [facility] at [phone] with questions or concerns regarding your invoice.   Our billing staff will not be able to assist you with questions regarding bills from these companies.  You will be contacted with the lab results as soon as they are available. The fastest way to get your results is to activate your My Chart account. Instructions are located on the last page of this paperwork. If you have not heard from [place] regarding the results in 2 weeks, please contact this office.     Diabetes Mellitus and Nutrition, Adult When you have diabetes (diabetes mellitus), it is very important to have healthy eating habits because your blood sugar (glucose) levels are greatly affected by what you eat and drink. Eating healthy foods in the appropriate amounts, at about the same times every day, can help you:  Control your blood glucose.  Lower your risk of heart disease.  Improve your blood pressure.  Reach or maintain a healthy weight. Every person with diabetes is different, and each person has different needs for a meal plan. Your health care provider may recommend that you work with a diet and nutrition specialist (dietitian) to  make a meal plan that is best for you. Your meal plan may vary depending on factors such as:  The calories you need.  The medicines you take.  Your weight.  Your blood glucose, blood pressure, and cholesterol levels.  Your activity level.  Other health conditions you have, such as heart or kidney disease. How do carbohydrates affect me? Carbohydrates, also called carbs, affect your blood glucose level more than any other type of food. Eating carbs naturally raises the amount of glucose in your blood. Carb counting is a method for keeping track of how many carbs you eat. Counting carbs is important to keep your blood glucose at a healthy level, especially if you use insulin or take certain oral diabetes medicines. It is important to know how many carbs you can safely have in each meal. This is different for every person. Your dietitian can help you calculate how many carbs you should have at each meal and for each snack. Foods that contain carbs include:  Bread,  cereal, rice, pasta, and crackers.  Potatoes and corn.  Peas, beans, and lentils.  Milk and yogurt.  Fruit and juice.  Desserts, such as cakes, cookies, ice cream, and candy. How does alcohol affect me? Alcohol can cause a sudden decrease in blood glucose (hypoglycemia), especially if you use insulin or take certain oral diabetes medicines. Hypoglycemia can be a life-threatening condition. Symptoms of hypoglycemia (sleepiness, dizziness, and confusion) are similar to symptoms of having too much alcohol. If your health care provider says that alcohol is safe for you, follow these guidelines:  Limit alcohol intake to no more than 1 drink per day for nonpregnant women and 2 drinks per day for men. One drink equals 12 oz of beer, 5 oz of wine, or 1 oz of hard liquor.  Do not drink on an empty stomach.  Keep yourself hydrated with water, diet soda, or unsweetened iced tea.  Keep in mind that regular soda, juice, and other  mixers may contain a lot of sugar and must be counted as carbs. What are tips for following this plan?  Reading food labels  Start by checking the serving size on the "Nutrition Facts" label of packaged foods and drinks. The amount of calories, carbs, fats, and other nutrients listed on the label is based on one serving of the item. Many items contain more than one serving per package.  Check the total grams (g) of carbs in one serving. You can calculate the number of servings of carbs in one serving by dividing the total carbs by 15. For example, if a food has 30 g of total carbs, it would be equal to 2 servings of carbs.  Check the number of grams (g) of saturated and trans fats in one serving. Choose foods that have low or no amount of these fats.  Check the number of milligrams (mg) of salt (sodium) in one serving. Most people should limit total sodium intake to less than 2,300 mg per day.  Always check the nutrition information of foods labeled as "low-fat" or "nonfat". These foods may be higher in added sugar or refined carbs and should be avoided.  Talk to your dietitian to identify your daily goals for nutrients listed on the label. Shopping  Avoid buying canned, premade, or processed foods. These foods tend to be high in fat, sodium, and added sugar.  Shop around the outside edge of the grocery store. This includes fresh fruits and vegetables, bulk grains, fresh meats, and fresh dairy. Cooking  Use low-heat cooking methods, such as baking, instead of high-heat cooking methods like deep frying.  Cook using healthy oils, such as olive, canola, or sunflower oil.  Avoid cooking with butter, cream, or high-fat meats. Meal planning  Eat meals and snacks regularly, preferably at the same times every day. Avoid going long periods of time without eating.  Eat foods high in fiber, such as fresh fruits, vegetables, beans, and whole grains. Talk to your dietitian about how many servings  of carbs you can eat at each meal.  Eat 4-6 ounces (oz) of lean protein each day, such as lean meat, chicken, fish, eggs, or tofu. One oz of lean protein is equal to: ? 1 oz of meat, chicken, or fish. ? 1 egg. ?  cup of tofu.  Eat some foods each day that contain healthy fats, such as avocado, nuts, seeds, and fish. Lifestyle  Check your blood glucose regularly.  Exercise regularly as told by your health care provider. This may include: ?  150 minutes of moderate-intensity or vigorous-intensity exercise each week. This could be brisk walking, biking, or water aerobics. ? Stretching and doing strength exercises, such as yoga or weightlifting, at least 2 times a week.  Take medicines as told by your health care provider.  Do not use any products that contain nicotine or tobacco, such as cigarettes and e-cigarettes. If you need help quitting, ask your health care provider.  Work with a Social worker or diabetes educator to identify strategies to manage stress and any emotional and social challenges. Questions to ask a health care provider  Do I need to meet with a diabetes educator?  Do I need to meet with a dietitian?  What number can I call if I have questions?  When are the best times to check my blood glucose? Where to find more information:  American Diabetes Association: diabetes.org  Academy of Nutrition and Dietetics: www.eatright.[employer] of Diabetes and Digestive and Kidney Diseases (NIH): [employer] Summary  A healthy meal plan will help you control your blood glucose and maintain a healthy lifestyle.  Working with a diet and nutrition specialist (dietitian) can help you make a meal plan that is best for you.  Keep in mind that carbohydrates (carbs) and alcohol have immediate effects on your blood glucose levels. It is important to count carbs and to use alcohol carefully. This information is not intended to replace advice given to you by your  health care provider. Make sure you discuss any questions you have with your health care provider. Document Released: 11/19/2004 Document Revised: 02/04/2017 Document Reviewed: 03/29/2016 Elsevier Patient Education  2020 Elsevier Inc.      Agustina Caroli, MD [facility]

## 2018-10-16 NOTE — Patient Instructions (Addendum)
   If you have lab work done today you will be contacted with your lab results within the next 2 weeks.  If you have not heard from us then please contact us. The fastest way to get your results is to register for My Chart.   IF you received an x-ray today, you will receive an invoice from [facility]. Please contact [facility] at [phone] with questions or concerns regarding your invoice.   IF you received labwork today, you will receive an invoice from [facility]. Please contact [facility] at [phone] with questions or concerns regarding your invoice.   Our billing staff will not be able to assist you with questions regarding bills from these companies.  You will be contacted with the lab results as soon as they are available. The fastest way to get your results is to activate your My Chart account. Instructions are located on the last page of this paperwork. If you have not heard from us regarding the results in 2 weeks, please contact this office.     Diabetes Mellitus and Nutrition, Adult When you have diabetes (diabetes mellitus), it is very important to have healthy eating habits because your blood sugar (glucose) levels are greatly affected by what you eat and drink. Eating healthy foods in the appropriate amounts, at about the same times every day, can help you:  Control your blood glucose.  Lower your risk of heart disease.  Improve your blood pressure.  Reach or maintain a healthy weight. Every person with diabetes is different, and each person has different needs for a meal plan. Your health care provider may recommend that you work with a diet and nutrition specialist (dietitian) to make a meal plan that is best for you. Your meal plan may vary depending on factors such as:  The calories you need.  The medicines you take.  Your weight.  Your blood glucose, blood pressure, and cholesterol levels.  Your activity level.  Other health conditions  you have, such as heart or kidney disease. How do carbohydrates affect me? Carbohydrates, also called carbs, affect your blood glucose level more than any other type of food. Eating carbs naturally raises the amount of glucose in your blood. Carb counting is a method for keeping track of how many carbs you eat. Counting carbs is important to keep your blood glucose at a healthy level, especially if you use insulin or take certain oral diabetes medicines. It is important to know how many carbs you can safely have in each meal. This is different for every person. Your dietitian can help you calculate how many carbs you should have at each meal and for each snack. Foods that contain carbs include:  Bread, cereal, rice, pasta, and crackers.  Potatoes and corn.  Peas, beans, and lentils.  Milk and yogurt.  Fruit and juice.  Desserts, such as cakes, cookies, ice cream, and candy. How does alcohol affect me? Alcohol can cause a sudden decrease in blood glucose (hypoglycemia), especially if you use insulin or take certain oral diabetes medicines. Hypoglycemia can be a life-threatening condition. Symptoms of hypoglycemia (sleepiness, dizziness, and confusion) are similar to symptoms of having too much alcohol. If your health care provider says that alcohol is safe for you, follow these guidelines:  Limit alcohol intake to no more than 1 drink per day for nonpregnant women and 2 drinks per day for men. One drink equals 12 oz of beer, 5 oz of wine, or 1 oz of hard liquor.    Do not drink on an empty stomach.  Keep yourself hydrated with water, diet soda, or unsweetened iced tea.  Keep in mind that regular soda, juice, and other mixers may contain a lot of sugar and must be counted as carbs. What are tips for following this plan?  Reading food labels  Start by checking the serving size on the "Nutrition Facts" label of packaged foods and drinks. The amount of calories, carbs, fats, and other  nutrients listed on the label is based on one serving of the item. Many items contain more than one serving per package.  Check the total grams (g) of carbs in one serving. You can calculate the number of servings of carbs in one serving by dividing the total carbs by 15. For example, if a food has 30 g of total carbs, it would be equal to 2 servings of carbs.  Check the number of grams (g) of saturated and trans fats in one serving. Choose foods that have low or no amount of these fats.  Check the number of milligrams (mg) of salt (sodium) in one serving. Most people should limit total sodium intake to less than 2,300 mg per day.  Always check the nutrition information of foods labeled as "low-fat" or "nonfat". These foods may be higher in added sugar or refined carbs and should be avoided.  Talk to your dietitian to identify your daily goals for nutrients listed on the label. Shopping  Avoid buying canned, premade, or processed foods. These foods tend to be high in fat, sodium, and added sugar.  Shop around the outside edge of the grocery store. This includes fresh fruits and vegetables, bulk grains, fresh meats, and fresh dairy. Cooking  Use low-heat cooking methods, such as baking, instead of high-heat cooking methods like deep frying.  Cook using healthy oils, such as olive, canola, or sunflower oil.  Avoid cooking with butter, cream, or high-fat meats. Meal planning  Eat meals and snacks regularly, preferably at the same times every day. Avoid going long periods of time without eating.  Eat foods high in fiber, such as fresh fruits, vegetables, beans, and whole grains. Talk to your dietitian about how many servings of carbs you can eat at each meal.  Eat 4-6 ounces (oz) of lean protein each day, such as lean meat, chicken, fish, eggs, or tofu. One oz of lean protein is equal to: ? 1 oz of meat, chicken, or fish. ? 1 egg. ?  cup of tofu.  Eat some foods each day that contain  healthy fats, such as avocado, nuts, seeds, and fish. Lifestyle  Check your blood glucose regularly.  Exercise regularly as told by your health care provider. This may include: ? 150 minutes of moderate-intensity or vigorous-intensity exercise each week. This could be brisk walking, biking, or water aerobics. ? Stretching and doing strength exercises, such as yoga or weightlifting, at least 2 times a week.  Take medicines as told by your health care provider.  Do not use any products that contain nicotine or tobacco, such as cigarettes and e-cigarettes. If you need help quitting, ask your health care provider.  Work with a counselor or diabetes educator to identify strategies to manage stress and any emotional and social challenges. Questions to ask a health care provider  Do I need to meet with a diabetes educator?  Do I need to meet with a dietitian?  What number can I call if I have questions?  When are the best times to   check my blood glucose? Where to find more information:  American Diabetes Association: diabetes.org  Academy of Nutrition and Dietetics: www.eatright.org  National Institute of Diabetes and Digestive and Kidney Diseases (NIH): www.niddk.nih.gov Summary  A healthy meal plan will help you control your blood glucose and maintain a healthy lifestyle.  Working with a diet and nutrition specialist (dietitian) can help you make a meal plan that is best for you.  Keep in mind that carbohydrates (carbs) and alcohol have immediate effects on your blood glucose levels. It is important to count carbs and to use alcohol carefully. This information is not intended to replace advice given to you by your health care provider. Make sure you discuss any questions you have with your health care provider. Document Released: 11/19/2004 Document Revised: 02/04/2017 Document Reviewed: 03/29/2016 Elsevier Patient Education  2020 Elsevier Inc.  

## 2018-10-16 NOTE — Assessment & Plan Note (Signed)
Uncontrolled diabetes but better than before.  Hemoglobin A1c down to 7.9 from 8.2.  Continue present medications, Januvia and glimepiride, with diet and exercise.  Follow-up in 6 months.

## 2018-10-16 NOTE — Assessment & Plan Note (Signed)
Well-controlled blood pressure.  Continue Hyzaar 100-25 daily.  Follow-up in 6 months.

## 2018-10-17 LAB — COMPREHENSIVE METABOLIC PANEL
ALT: 18 IU/L (ref 0–32)
AST: 19 IU/L (ref 0–40)
Albumin/Globulin Ratio: 1.6 (ref 1.2–2.2)
Albumin: 4.1 g/dL (ref 3.8–4.9)
Alkaline Phosphatase: 83 IU/L (ref 39–117)
BUN/Creatinine Ratio: 24 (ref 12–28)
BUN: 19 mg/dL (ref 8–27)
Bilirubin Total: <0.2 mg/dL (ref 0.0–1.2)
CO2: 27 mmol/L (ref 20–29)
Calcium: 9.8 mg/dL (ref 8.7–10.3)
Chloride: 93 mmol/L — ABNORMAL LOW (ref 96–106)
Creatinine, Ser: 0.78 mg/dL (ref 0.57–1.00)
GFR calc Af Amer: 96 mL/min/{1.73_m2} (ref >59)
GFR calc non Af Amer: 83 mL/min/{1.73_m2} (ref >59)
Globulin, Total: 2.6 g/dL (ref 1.5–4.5)
Glucose: 117 mg/dL — ABNORMAL HIGH (ref 65–99)
Potassium: 3.2 mmol/L — ABNORMAL LOW (ref 3.5–5.2)
Sodium: 141 mmol/L (ref 134–144)
Total Protein: 6.7 g/dL (ref 6.0–8.5)

## 2018-10-17 LAB — CBC WITH DIFFERENTIAL/PLATELET
Basophils Absolute: 0.1 10*3/uL (ref 0.0–0.2)
Basos: 1 %
EOS (ABSOLUTE): 0.3 10*3/uL (ref 0.0–0.4)
Eos: 3 %
Hematocrit: 41.2 % (ref 34.0–46.6)
Hemoglobin: 13.8 g/dL (ref 11.1–15.9)
Immature Grans (Abs): 0.1 10*3/uL (ref 0.0–0.1)
Immature Granulocytes: 1 %
Lymphocytes Absolute: 2.7 10*3/uL (ref 0.7–3.1)
Lymphs: 35 %
MCH: 28.3 pg (ref 26.6–33.0)
MCHC: 33.5 g/dL (ref 31.5–35.7)
MCV: 85 fL (ref 79–97)
Monocytes Absolute: 0.6 10*3/uL (ref 0.1–0.9)
Monocytes: 7 %
Neutrophils Absolute: 4.2 10*3/uL (ref 1.4–7.0)
Neutrophils: 53 %
Platelets: 230 10*3/uL (ref 150–450)
RBC: 4.87 x10E6/uL (ref 3.77–5.28)
RDW: 13.6 % (ref 11.7–15.4)
WBC: 7.9 10*3/uL (ref 3.4–10.8)

## 2018-10-17 LAB — LIPID PANEL
Chol/HDL Ratio: 2.7 ratio (ref 0.0–4.4)
Cholesterol, Total: 172 mg/dL (ref 100–199)
HDL: 64 mg/dL (ref >39)
LDL Calculated: 75 mg/dL (ref 0–99)
Triglycerides: 164 mg/dL — ABNORMAL HIGH (ref 0–149)
VLDL Cholesterol Cal: 33 mg/dL (ref 5–40)

## 2018-11-07 ENCOUNTER — Ambulatory Visit (INDEPENDENT_AMBULATORY_CARE_PROVIDER_SITE_OTHER): Payer: Medicare Other | Admitting: Psychiatry

## 2018-11-07 ENCOUNTER — Encounter: Payer: Self-pay | Admitting: Psychiatry

## 2018-11-07 ENCOUNTER — Other Ambulatory Visit: Payer: Self-pay | Admitting: Emergency Medicine

## 2018-11-07 ENCOUNTER — Other Ambulatory Visit: Payer: Self-pay

## 2018-11-07 DIAGNOSIS — F3160 Bipolar disorder, current episode mixed, unspecified: Secondary | ICD-10-CM | POA: Diagnosis not present

## 2018-11-07 DIAGNOSIS — E1165 Type 2 diabetes mellitus with hyperglycemia: Secondary | ICD-10-CM

## 2018-11-07 DIAGNOSIS — F411 Generalized anxiety disorder: Secondary | ICD-10-CM | POA: Diagnosis not present

## 2018-11-07 DIAGNOSIS — F5105 Insomnia due to other mental disorder: Secondary | ICD-10-CM | POA: Diagnosis not present

## 2018-11-07 DIAGNOSIS — G2581 Restless legs syndrome: Secondary | ICD-10-CM

## 2018-11-07 NOTE — Progress Notes (Signed)
Virtual Visit via Video Note  I connected with Rachel Luna on 11/07/18 at 10:00 AM EDT by a video enabled telemedicine application and verified that I am speaking with the correct person using two identifiers.   I discussed the limitations of evaluation and management by telemedicine and the availability of in person appointments. The patient expressed understanding and agreed to proceed.   I discussed the assessment and treatment plan with the patient. The patient was provided an opportunity to ask questions and all were answered. The patient agreed with the plan and demonstrated an understanding of the instructions.   The patient was advised to call back or seek an in-person evaluation if the symptoms worsen or if the condition fails to improve as anticipated.  Marmet MD  OP Progress Note  11/07/2018 12:13 PM Rachel Luna  MRN:  [id number]  Chief Complaint:  Chief Complaint    Follow-up     HPI: [name] is a 63 year old Caucasian female, divorced, disabled, lives in [place], has a history of bipolar disorder, GAD, insomnia, seizure disorder, morbid obesity, chronic pain, neuropathy was evaluated by telemedicine today.  A video call was initiated however it had to be changed to a phone call due to connection problem.  Patient today appeared to be anxious and irritable.  She reports that she just woke up from her sleep.  She reports sleep is restless.  She reports she continues to struggle with lack of motivation and sadness often.  She also has crying spells.  She continues to be in psychotherapy sessions with [name].  She reports sessions is going well.  She reports she has the current stressors of her father going into an assisted living facility.  She is not taking it too well.  She wants him back however is unable to do that at this time.  She is unable to visit him in person due to the COVID-19 pandemic.  She reports she stopped taking the olanzapine since it gave her side  effects.  She reports her father was also placed on the same medication and it gave him the same side effects.  Patient reports that she does not want to be on any new mood stabilizer or any other medication at this time since she does not want to keep trying new medications.  Discussed with patient that she can be referred for a genetic testing.  She however declines.  She reports she will continue to work with her therapist at this time and is not interested in any new medications. Visit Diagnosis:    ICD-10-CM   1. Bipolar I disorder, most recent episode mixed ([place])  F31.60   2. GAD (generalized anxiety disorder)  F41.1   3. Insomnia due to mental disorder  F51.05     Past Psychiatric History: I have reviewed past psychiatric history from my progress note on 10/19/2017.  Abilify, Klonopin, Elavil, Cymbalta, Tegretol, Effexor, trazodone  Past Medical History:  Past Medical History:  Diagnosis Date  . Anxiety   . Arthritis   . Bipolar disorder ([place])   . chronic low back pain   . Chronic low back pain 02/18/2014  . Depression   . Diabetes mellitus without complication ([place])   . Diabetes mellitus, type II ([place])   . Fibroids   . GERD (gastroesophageal reflux disease)   . Gout   . Hyperlipidemia   . Hypertension   . Obesity     Past Surgical History:  Procedure Laterality Date  .  ABDOMINAL HYSTERECTOMY    . CHOLECYSTECTOMY    . FOOT SURGERY Left   . KNEE ARTHROPLASTY    . OVARIAN CYST REMOVAL    . SPINE SURGERY      Family Psychiatric History: I have reviewed family psychiatric history from my progress note on 10/19/2017.  Family History:  Family History  Problem Relation Age of Onset  . Diabetes Mother   . Cancer Mother        breast  . Heart disease Mother   . Mental illness Mother        bipolar disorder  . Stroke Mother   . Breast cancer Mother   . Hypertension Mother   . High Cholesterol Mother   . Diabetes Father   . Hypertension Father   . Diabetes  Brother   . Bipolar disorder Brother   . Diabetes Brother   . Breast cancer Maternal Aunt   . Breast cancer Maternal Grandmother     Social History: I have reviewed social history from my progress note on 10/19/2017. Social History   Socioeconomic History  . Marital status: Divorced    Spouse name: Not on file  . Number of children: 0  . Years of education: 10  . Highest education level: 10th grade  Occupational History    Employer: UNEMPLOYED  Social Needs  . Financial resource strain: Very hard  . Food insecurity    Worry: Often true    Inability: Often true  . Transportation needs    Medical: No    Non-medical: No  Tobacco Use  . Smoking status: Passive Smoke Exposure - Never Smoker  . Smokeless tobacco: Never Used  . Tobacco comment: room mate smokes  Substance and Sexual Activity  . Alcohol use: No    Alcohol/week: 0.0 standard drinks  . Drug use: No  . Sexual activity: Not Currently  Lifestyle  . Physical activity    Days per week: 0 days    Minutes per session: 0 min  . Stress: Very much  Relationships  . Social Herbalist on phone: Three times a week    Gets together: Once a week    Attends religious service: 1 to 4 times per year    Active member of club or organization: No    Attends meetings of clubs or organizations: Never    Relationship status: Divorced  Other Topics Concern  . Not on file  Social History Narrative   Patient is divorced and lives with a roommate.   Patient is on disability.   Patient has a 10th grade education.   Patient is right-handed.   Patient drinks 6 sodas daily.          Allergies:  Allergies  Allergen Reactions  . Wellbutrin [Bupropion] Other (See Comments)    Irritability and Agitation  . Geodon [Ziprasidone Hcl] Other (See Comments)    Worsened Restless Leg Syndrome  . Lisinopril Cough  . Neupro [Rotigotine] Rash    Metabolic Disorder Labs: Lab Results  Component Value Date   HGBA1C 7.9 (A)  10/16/2018   No results found for: PROLACTIN Lab Results  Component Value Date   CHOL 172 10/16/2018   TRIG 164 (H) 10/16/2018   HDL 64 10/16/2018   CHOLHDL 2.7 10/16/2018   VLDL 41 (H) 03/25/2014   LDLCALC 75 10/16/2018   LDLCALC 68 07/13/2018   Lab Results  Component Value Date   TSH 2.030 07/13/2018    Therapeutic Level Labs: No results  found for: LITHIUM No results found for: VALPROATE No components found for:  CBMZ  Current Medications: Current Outpatient Medications  Medication Sig Dispense Refill  . ACCU-CHEK AVIVA PLUS test strip Use in the morning and after meals as needed. 100 each 5  . baclofen (LIORESAL) 10 MG tablet TAKE 1 TABLET BY MOUTH 3 TIMES DAILY 270 each 1  . carbamazepine (TEGRETOL) 200 MG tablet TAKE 1/2 TABLET BY MOUTH ONCE DAILY 45 tablet 1  . cetirizine (ZYRTEC) 10 MG tablet Take 10 mg by mouth daily.    . clonazePAM (KLONOPIN) 0.5 MG tablet Take 1 tablet (0.5 mg total) by mouth at bedtime. 90 tablet 1  . DULoxetine (CYMBALTA) 30 MG capsule Take 1 capsule (30 mg total) by mouth daily. To be combined with 60 mg 90 capsule 0  . DULoxetine (CYMBALTA) 60 MG capsule Take 1 capsule (60 mg total) by mouth daily. 90 capsule 1  . estradiol (ESTRACE) 0.5 MG tablet Take 1 tablet (0.5 mg total) by mouth daily. 30 tablet 11  . gabapentin (NEURONTIN) 800 MG tablet Take 1 tablet (800 mg total) by mouth 3 (three) times daily. 270 tablet 1  . glimepiride (AMARYL) 4 MG tablet Take 1 tablet (4 mg total) by mouth daily with breakfast. 90 tablet 1  . losartan-hydrochlorothiazide (HYZAAR) 100-25 MG tablet Take 1 tablet by mouth daily. 90 tablet 3  . meloxicam (MOBIC) 15 MG tablet Take 15 mg daily by mouth.     . pramipexole (MIRAPEX) 0.75 MG tablet TAKE 1 TABLET BY MOUTH ONCE EVERY MORNING AND 2 TABLET EVERY EVENING (Patient not taking: Reported on 10/16/2018) 270 tablet 2  . propranolol (INDERAL) 10 MG tablet Take 1 tablet (10 mg total) by mouth 3 (three) times daily as  needed. For severe anxiety attacks only 90 tablet 1  . rosuvastatin (CRESTOR) 40 MG tablet Take 1 tablet by mouth daily.    . sitaGLIPtin (JANUVIA) 50 MG tablet Take 1 tablet (50 mg total) by mouth daily. 90 tablet 1   No current facility-administered medications for this visit.      Musculoskeletal: Strength & Muscle Tone: UTA Gait & Station: Reports as WNL Patient leans: N/A  Psychiatric Specialty Exam: Review of Systems  Psychiatric/Behavioral: The patient is nervous/anxious.   All other systems reviewed and are negative.   There were no vitals taken for this visit.There is no height or weight on file to calculate BMI.  General Appearance: UTA  Eye Contact:  UTA  Speech:  Clear and Coherent  Volume:  Normal  Mood:  Anxious and Irritable  Affect:  UTA  Thought Process:  Goal Directed and Descriptions of Associations: Intact  Orientation:  Full (Time, Place, and Person)  Thought Content: Logical   Suicidal Thoughts:  No  Homicidal Thoughts:  No  Memory:  Immediate;   Fair Recent;   Fair Remote;   Fair  Judgement:  Fair  Insight:  Fair  Psychomotor Activity:  UTA  Concentration:  Concentration: Fair and Attention Span: Fair  Recall:  [employer] of Knowledge: Fair  Language: Fair  Akathisia:  No  Handed:  Right  AIMS (if indicated): denies tremors, rigidity  Assets:  Communication Skills Desire for Improvement Housing  ADL's:  Intact  Cognition: WNL  Sleep:  Restless   Screenings: PHQ2-9     Office Visit from 10/16/2018 in Primary Care at [facility] from 07/13/2018 in [place] at [facility] Total Score  0  3  PHQ-9 Total  Score  -  16       Assessment and Plan: Rachel Luna is a 63 year old Caucasian female, divorced, disabled, lives in [place], has a history of bipolar disorder, anxiety disorder, insomnia, seizure disorder, neuropathy, chronic pain, morbid obesity was evaluated by telemedicine today.  Patient continues to struggle with anxiety and mood  lability declines medication readjustment.  She reports she wants to continue psychotherapy sessions at this time.  Plan Bipolar disorder-unstable Cymbalta 90 mg p.o. daily Tegretol 100 mg p.o. daily-prescribed by neurologist for seizures, currently being tapered down Discontinue Zyprexa for side effects.  Anxiety disorder-unstable Cymbalta as prescribed Propranolol 10 mg p.o. 3 times daily as needed Continue CBT with [name]  Insomnia- restless Melatonin as prescribed. Klonopin and Mirapex for her restless leg syndrome per her other provider.  Discussed getting genetic testing done since she is sensitive to medications in general and has failed multiple medications or had side effects in the past.  Patient however declines.  Follow-up in clinic in 6 to 8 weeks or sooner if needed.  October 21 at 10:45 AM  I have spent atleast 15 minutes non face to face with patient today. More than 50 % of the time was spent for psychoeducation and supportive psychotherapy and care coordination. This note was generated in part or whole with voice recognition software. Voice recognition is usually quite accurate but there are transcription errors that can and very often do occur. I apologize for any typographical errors that were not detected and corrected.       Ursula Alert, MD 11/07/2018, 12:13 PM

## 2018-11-21 ENCOUNTER — Telehealth: Payer: Self-pay

## 2018-11-21 DIAGNOSIS — F411 Generalized anxiety disorder: Secondary | ICD-10-CM

## 2018-11-21 MED ORDER — PROPRANOLOL HCL 10 MG PO TABS: 10.0000 mg | ORAL_TABLET | Freq: Three times a day (TID) | ORAL | 2 refills | Status: DC | PRN

## 2018-11-21 NOTE — Telephone Encounter (Signed)
Sent propranolol to pharmacy

## 2018-11-21 NOTE — Telephone Encounter (Signed)
received a fax requesting a refill on propranolol hcl 10mg    propranolol (INDERAL) 10 MG tablet Medication Date: 09/19/2018 Department: [facility]- Ordering/Authorizing: Norman Clay, MD  Order Providers  Prescribing Provider Encounter Provider  Norman Clay, MD Norman Clay, MD  Outpatient Medication Detail   Disp Refills Start End   propranolol (INDERAL) 10 MG tablet 90 tablet 1 09/19/2018    Sig - Route: Take 1 tablet (10 mg total) by mouth 3 (three) times daily as needed. For severe anxiety attacks only - Oral   Sent to pharmacy as: propranolol (INDERAL) 10 MG tablet   Notes to Pharmacy: Monthly refill only   E-Prescribing Status: Receipt confirmed by pharmacy (09/19/2018 4:17 PM EDT)   Renewals  Renewal requests to authorizing provider Modesta Messing, Elie Goody, MD) prohibited  Renewal provider: Ursula Alert, MD     Associated Diagnoses  GAD (generalized anxiety disorder)     Pharmacy  [facility], [place].

## 2018-12-06 ENCOUNTER — Other Ambulatory Visit: Payer: Self-pay | Admitting: Emergency Medicine

## 2018-12-06 DIAGNOSIS — Z1231 Encounter for screening mammogram for malignant neoplasm of breast: Secondary | ICD-10-CM

## 2018-12-08 ENCOUNTER — Telehealth: Payer: Self-pay | Admitting: Psychiatry

## 2018-12-08 DIAGNOSIS — F411 Generalized anxiety disorder: Secondary | ICD-10-CM

## 2018-12-08 DIAGNOSIS — F3160 Bipolar disorder, current episode mixed, unspecified: Secondary | ICD-10-CM

## 2018-12-08 MED ORDER — DULOXETINE HCL 60 MG PO CPEP: 60.0000 mg | ORAL_CAPSULE | Freq: Every day | ORAL | 1 refills | Status: DC

## 2018-12-08 MED ORDER — DULOXETINE HCL 30 MG PO CPEP: 30.0000 mg | ORAL_CAPSULE | Freq: Every day | ORAL | 1 refills | Status: DC

## 2018-12-08 NOTE — Telephone Encounter (Signed)
Sent cymbalta to pharmacy 

## 2018-12-14 ENCOUNTER — Other Ambulatory Visit: Payer: Self-pay | Admitting: Emergency Medicine

## 2018-12-14 ENCOUNTER — Other Ambulatory Visit: Payer: Self-pay | Admitting: Neurology

## 2018-12-14 NOTE — Telephone Encounter (Signed)
Medication Refill - Medication: rosuvastatin (CRESTOR) 40 MG tablet   Has the patient contacted their pharmacy? Yes.   (Agent: If no, request that the patient contact the pharmacy for the refill.) (Agent: If yes, when and what did the pharmacy advise?)  Preferred Pharmacy (with phone number or street name): [facility] - [place], [place].  Agent: Please be advised that RX refills may take up to 3 business days. We ask that you follow-up with your pharmacy.

## 2018-12-14 NOTE — Telephone Encounter (Signed)
Requested medication (s) are due for refill today: yes  Requested medication (s) are on the active medication list: yes  Future visit scheduled: yes  Notes to clinic:  Review for refill Last filled by historical provider    Requested Prescriptions  Pending Prescriptions Disp Refills   rosuvastatin (CRESTOR) 40 MG tablet       Sig: Take 1 tablet (40 mg total) by mouth daily.     Cardiovascular:  Antilipid - Statins Failed - 12/14/2018  9:55 AM      Failed - Triglycerides in normal range and within 360 days    Triglycerides  Date Value Ref Range Status  10/16/2018 164 (H) 0 - 149 mg/dL Final  03/25/2014 204 (H) 0 - 200 mg/dL Final         Passed - Total Cholesterol in normal range and within 360 days    Cholesterol, Total  Date Value Ref Range Status  10/16/2018 172 100 - 199 mg/dL Final   Cholesterol  Date Value Ref Range Status  03/25/2014 220 (H) 0 - 200 mg/dL Final         Passed - LDL in normal range and within 360 days    Ldl Cholesterol, Calc  Date Value Ref Range Status  03/25/2014 104 (H) 0 - 100 mg/dL Final   LDL Calculated  Date Value Ref Range Status  10/16/2018 75 0 - 99 mg/dL Final         Passed - HDL in normal range and within 360 days    HDL Cholesterol  Date Value Ref Range Status  03/25/2014 75 (H) 40 - 60 mg/dL Final   HDL  Date Value Ref Range Status  10/16/2018 64 >39 mg/dL Final         Passed - Patient is not pregnant      Passed - Valid encounter within last 12 months    Recent Outpatient Visits          1 month ago Type 2 diabetes mellitus with hyperglycemia, without long-term current use of insulin [facility])   Primary Care at [facility], [place], MD   5 months ago Type 2 diabetes mellitus with hyperglycemia, without long-term current use of insulin [facility])   Primary Care at [facility], Ines Bloomer, MD      Future Appointments            In 4 months [place], Ines Bloomer, MD Primary Care at [place], [facility]

## 2018-12-15 MED ORDER — ROSUVASTATIN CALCIUM 40 MG PO TABS: 40.0000 mg | ORAL_TABLET | Freq: Every day | ORAL | 3 refills | Status: DC

## 2018-12-20 ENCOUNTER — Other Ambulatory Visit: Payer: Self-pay | Admitting: Emergency Medicine

## 2018-12-20 DIAGNOSIS — Z780 Asymptomatic menopausal state: Secondary | ICD-10-CM

## 2018-12-20 NOTE — Telephone Encounter (Signed)
Medication Refill - Medication:  estradiol (ESTRACE) 0.5 MG tablet  Has the patient contacted their pharmacy?  Yes advised to call office.   Preferred Pharmacy (with phone number or street name):  [place], [place]. [phone] (Phone) [phone] (Fax)   Agent: Please be advised that RX refills may take up to 3 business days. We ask that you follow-up with your pharmacy.

## 2018-12-20 NOTE — Telephone Encounter (Signed)
Requested medication (s) are due for refill today: yes  Requested medication (s) are on the active medication list: yes  Last refill:  10/10/2017  Future visit scheduled: yes  Notes to clinic:  Last filled by Dr.Harper   Requested Prescriptions  Pending Prescriptions Disp Refills   estradiol (ESTRACE) 0.5 MG tablet 30 tablet 11    Sig: Take 1 tablet (0.5 mg total) by mouth daily.     OB/GYN:  Estrogens Passed - 12/20/2018  8:30 AM      Passed - Mammogram is up-to-date per Health Maintenance      Passed - Last BP in normal range    BP Readings from Last 1 Encounters:  10/16/18 124/76         Passed - Valid encounter within last 12 months    Recent Outpatient Visits          2 months ago Type 2 diabetes mellitus with hyperglycemia, without long-term current use of insulin [facility])   Primary Care at [facility], [place], [place]   5 months ago Type 2 diabetes mellitus with hyperglycemia, without long-term current use of insulin [facility])   Primary Care at [facility], Ines Bloomer, MD      Future Appointments            In 3 months [place], Ines Bloomer, MD Primary Care at [place], [facility]

## 2018-12-26 ENCOUNTER — Ambulatory Visit (INDEPENDENT_AMBULATORY_CARE_PROVIDER_SITE_OTHER): Payer: Medicare Other | Admitting: Obstetrics

## 2018-12-26 ENCOUNTER — Encounter: Payer: Self-pay | Admitting: Obstetrics

## 2018-12-26 DIAGNOSIS — Z780 Asymptomatic menopausal state: Secondary | ICD-10-CM | POA: Diagnosis not present

## 2018-12-26 DIAGNOSIS — Z79890 Hormone replacement therapy (postmenopausal): Secondary | ICD-10-CM

## 2018-12-26 DIAGNOSIS — N951 Menopausal and female climacteric states: Secondary | ICD-10-CM | POA: Diagnosis not present

## 2018-12-26 MED ORDER — ESTRADIOL 0.5 MG PO TABS: 0.5000 mg | ORAL_TABLET | Freq: Every day | ORAL | 11 refills | Status: AC

## 2018-12-26 NOTE — Progress Notes (Signed)
TELEHEALTH GYNECOLOGY VIRTUAL VIDEO VISIT ENCOUNTER NOTE  Provider location: Center for [facility] [employer] [facility] at [place]   I connected with Rachel Luna on 12/26/18 at  3:15 PM EDT by WebEx OB Video Encounter at home and verified that I am speaking with the correct person using two identifiers.   I discussed the limitations, risks, security and privacy concerns of performing an evaluation and management service virtually and the availability of in person appointments. I also discussed with the patient that there may be a patient responsible charge related to this service. The patient expressed understanding and agreed to proceed.   History:  Rachel Luna is a 63 y.o. G0P0000 female being evaluated today for severe vasomotor symptoms and mood swings after running out of hormone replacement therapy.  She has been on Estrace 0.5 mg daily with good relief.  She is s/p TAH.  We tried her on the patch initially, but she didn't like it because because of poor adhesion and skin irritation. She denies any abnormal vaginal discharge, bleeding, pelvic pain or other concerns.       Past Medical History:  Diagnosis Date  . Anxiety   . Arthritis   . Bipolar disorder ([place])   . chronic low back pain   . Chronic low back pain 02/18/2014  . Depression   . Diabetes mellitus without complication ([place])   . Diabetes mellitus, type II ([place])   . Fibroids   . GERD (gastroesophageal reflux disease)   . Gout   . Hyperlipidemia   . Hypertension   . Obesity    Past Surgical History:  Procedure Laterality Date  . ABDOMINAL HYSTERECTOMY    . CHOLECYSTECTOMY    . FOOT SURGERY Left   . KNEE ARTHROPLASTY    . OVARIAN CYST REMOVAL    . SPINE SURGERY     The following portions of the patient's history were reviewed and updated as appropriate: allergies, current medications, past family history, past medical history, past social history, past surgical history and problem list.   Health Maintenance:  Normal  pap and negative HRHPV in 2016.  Normal mammogram on 10-28-2017.   Review of Systems:  Pertinent items noted in HPI and remainder of comprehensive ROS otherwise negative.  Physical Exam:   General:  Alert, oriented and cooperative. Patient appears to be in no acute distress.  Mental Status: Normal mood and affect. Normal behavior. Normal judgment and thought content.   Respiratory: Normal respiratory effort, no problems with respiration noted  Rest of physical exam deferred due to type of encounter  Labs and Imaging No results found for this or any previous visit (from the past 336 hour(s)). No results found.     Assessment and Plan:     1. Postmenopausal estrogen deficiency Rx: - estradiol (ESTRACE) 0.5 MG tablet; Take 1 tablet (0.5 mg total) by mouth daily.  Dispense: 30 tablet; Refill: 11  2. Menopausal syndrome (hot flashes)  3. Need for prophylactic hormone replacement therapy (postmenopausal)       I discussed the assessment and treatment plan with the patient. The patient was provided an opportunity to ask questions and all were answered. The patient agreed with the plan and demonstrated an understanding of the instructions.   The patient was advised to call back or seek an in-person evaluation/go to the ED if the symptoms worsen or if the condition fails to improve as anticipated.  I provided 15 minutes of face-to-face time during this encounter.   Juanda Crumble  Jodi Mourning, [place] for [employer], [facility] 12/26/2018

## 2018-12-27 ENCOUNTER — Encounter: Payer: Self-pay | Admitting: Psychiatry

## 2018-12-27 ENCOUNTER — Ambulatory Visit (INDEPENDENT_AMBULATORY_CARE_PROVIDER_SITE_OTHER): Payer: Medicare Other | Admitting: Psychiatry

## 2018-12-27 ENCOUNTER — Other Ambulatory Visit: Payer: Self-pay

## 2018-12-27 DIAGNOSIS — F5105 Insomnia due to other mental disorder: Secondary | ICD-10-CM | POA: Diagnosis not present

## 2018-12-27 DIAGNOSIS — F411 Generalized anxiety disorder: Secondary | ICD-10-CM | POA: Diagnosis not present

## 2018-12-27 DIAGNOSIS — F3160 Bipolar disorder, current episode mixed, unspecified: Secondary | ICD-10-CM | POA: Diagnosis not present

## 2018-12-27 MED ORDER — BELSOMRA 5 MG PO TABS: 5.0000 mg | ORAL_TABLET | Freq: Every evening | ORAL | 1 refills | Status: DC | PRN

## 2018-12-27 NOTE — Progress Notes (Signed)
Virtual Visit via Video Luna  I connected with Rachel Luna on 12/27/18 at 10:45 AM EDT by a video enabled telemedicine application and verified that I am speaking with the correct person using two identifiers.   I discussed the limitations of evaluation and management by telemedicine and the availability of in person appointments. The patient expressed understanding and agreed to proceed.    I discussed the assessment and treatment plan with the patient. The patient was provided an opportunity to ask questions and all were answered. The patient agreed with the plan and demonstrated an understanding of the instructions.   The patient was advised to call back or seek an in-person evaluation if the symptoms worsen or if the condition fails to improve as anticipated.  Rachel Luna  12/27/2018 12:27 PM Rachel Luna  MRN:  [id number]  Chief Complaint:  Chief Complaint    Follow-up     HPI: Rachel Luna is a 63 year old Caucasian female, divorced, disabled, lives in [place], has a history of bipolar disorder, GAD, insomnia, seizure disorder, morbid obesity, chronic pain, neuropathy was evaluated by telemedicine today.  Patient today reports she continues to struggle with anxiety and irritability.  She also has been having problems sleeping the past few days.  She however reports she does not want to make changes with her mood stabilizer today.  She wants to get genetic testing done if possible before making further changes due to her previous adverse side effects to multiple medication trials.  She however reports she is willing to try something for sleep.  She already takes low-dose Klonopin for restless leg syndrome.  Discussed Belsomra.  She has never tried it before.  She reports her primary care provider has changed her medications to help hot flashes which also has been affecting her sleep.  She just started taking it couple of days ago.  She continues to be in psychotherapy  sessions with [name] which is very beneficial.  She reports her dad continues to be in the assisted living facility.  She reports she visits him on a weekly basis.  She denies any other concerns today. Visit Diagnosis:    ICD-10-CM   1. Bipolar I disorder, most recent episode mixed ([place])  F31.60   2. GAD (generalized anxiety disorder)  F41.1   3. Insomnia due to mental disorder  F51.05 Suvorexant (BELSOMRA) 5 MG TABS    Past Psychiatric History: I have reviewed past psychiatric history from my progress Luna on 10/19/2017.  Past trials of Abilify, Klonopin, Elavil, Cymbalta, Tegretol, Effexor, trazodone.  Past Medical History:  Past Medical History:  Diagnosis Date  . Anxiety   . Arthritis   . Bipolar disorder ([place])   . chronic low back pain   . Chronic low back pain 02/18/2014  . Depression   . Diabetes mellitus without complication ([place])   . Diabetes mellitus, type II ([place])   . Fibroids   . GERD (gastroesophageal reflux disease)   . Gout   . Hyperlipidemia   . Hypertension   . Obesity     Past Surgical History:  Procedure Laterality Date  . ABDOMINAL HYSTERECTOMY    . CHOLECYSTECTOMY    . FOOT SURGERY Left   . KNEE ARTHROPLASTY    . OVARIAN CYST REMOVAL    . SPINE SURGERY      Family Psychiatric History: I have reviewed family psychiatric history from my progress Luna on 10/19/2017  Family History:  Family History  Problem Relation  Age of Onset  . Diabetes Mother   . Cancer Mother        breast  . Heart disease Mother   . Mental illness Mother        bipolar disorder  . Stroke Mother   . Breast cancer Mother   . Hypertension Mother   . High Cholesterol Mother   . Diabetes Father   . Hypertension Father   . Diabetes Brother   . Bipolar disorder Brother   . Diabetes Brother   . Breast cancer Maternal Aunt   . Breast cancer Maternal Grandmother     Social History: I have reviewed social history from my progress Luna on 10/19/2017 Social History    Socioeconomic History  . Marital status: Divorced    Spouse name: Not on file  . Number of children: 0  . Years of education: 10  . Highest education level: 10th grade  Occupational History    Employer: UNEMPLOYED  Social Needs  . Financial resource strain: Very hard  . Food insecurity    Worry: Often true    Inability: Often true  . Transportation needs    Medical: No    Non-medical: No  Tobacco Use  . Smoking status: Passive Smoke Exposure - Never Smoker  . Smokeless tobacco: Never Used  . Tobacco comment: room mate smokes  Substance and Sexual Activity  . Alcohol use: No    Alcohol/week: 0.0 standard drinks  . Drug use: No  . Sexual activity: Not Currently  Lifestyle  . Physical activity    Days per week: 0 days    Minutes per session: 0 min  . Stress: Very much  Relationships  . Social Herbalist on phone: Three times a week    Gets together: Once a week    Attends religious service: 1 to 4 times per year    Active member of club or organization: No    Attends meetings of clubs or organizations: Never    Relationship status: Divorced  Other Topics Concern  . Not on file  Social History Narrative   Patient is divorced and lives with a roommate.   Patient is on disability.   Patient has a 10th grade education.   Patient is right-handed.   Patient drinks 6 sodas daily.          Allergies:  Allergies  Allergen Reactions  . Wellbutrin [Bupropion] Other (See Comments)    Irritability and Agitation  . Geodon [Ziprasidone Hcl] Other (See Comments)    Worsened Restless Leg Syndrome  . Lisinopril Cough  . Neupro [Rotigotine] Rash    Metabolic Disorder Labs: Lab Results  Component Value Date   HGBA1C 7.9 (A) 10/16/2018   No results found for: PROLACTIN Lab Results  Component Value Date   CHOL 172 10/16/2018   TRIG 164 (H) 10/16/2018   HDL 64 10/16/2018   CHOLHDL 2.7 10/16/2018   VLDL 41 (H) 03/25/2014   LDLCALC 75 10/16/2018   LDLCALC  68 07/13/2018   Lab Results  Component Value Date   TSH 2.030 07/13/2018    Therapeutic Level Labs: No results found for: LITHIUM No results found for: VALPROATE No components found for:  CBMZ  Current Medications: Current Outpatient Medications  Medication Sig Dispense Refill  . ACCU-CHEK AVIVA PLUS test strip Use in the morning and after meals as needed. 100 each 5  . baclofen (LIORESAL) 10 MG tablet TAKE 1 TABLET BY MOUTH 3 TIMES DAILY 270 each  1  . carbamazepine (TEGRETOL) 200 MG tablet TAKE 1/2 TABLET BY MOUTH ONCE DAILY 45 tablet 1  . cetirizine (ZYRTEC) 10 MG tablet Take 10 mg by mouth daily.    . clonazePAM (KLONOPIN) 0.5 MG tablet Take 1 tablet (0.5 mg total) by mouth at bedtime. 90 tablet 1  . DULoxetine (CYMBALTA) 30 MG capsule Take 1 capsule (30 mg total) by mouth daily. To be combined with 60 mg 90 capsule 1  . DULoxetine (CYMBALTA) 60 MG capsule Take 1 capsule (60 mg total) by mouth daily. 90 capsule 1  . estradiol (ESTRACE) 0.5 MG tablet Take 1 tablet (0.5 mg total) by mouth daily. 30 tablet 11  . gabapentin (NEURONTIN) 800 MG tablet TAKE 1 TABLET BY MOUTH 3 TIMES DAILY 270 tablet 1  . glimepiride (AMARYL) 4 MG tablet Take 1 tablet (4 mg total) by mouth daily with breakfast. 90 tablet 1  . JANUVIA 50 MG tablet TAKE 1 TABLET BY MOUTH ONCE DAILY 90 tablet 1  . losartan-hydrochlorothiazide (HYZAAR) 100-25 MG tablet Take 1 tablet by mouth daily. 90 tablet 3  . meloxicam (MOBIC) 15 MG tablet Take 15 mg daily by mouth.     . pramipexole (MIRAPEX) 0.75 MG tablet TAKE 1 TABLET BY MOUTH ONCE EVERY MORNING AND 2 TABLET EVERY EVENING 270 tablet 2  . rosuvastatin (CRESTOR) 40 MG tablet Take 1 tablet (40 mg total) by mouth daily. 30 tablet 3  . Suvorexant (BELSOMRA) 5 MG TABS Take 5 mg by mouth at bedtime as needed. sleep 15 tablet 1   No current facility-administered medications for this visit.      Musculoskeletal: Strength & Muscle Tone: UTA Gait & Station: normal Patient  leans: N/A  Psychiatric Specialty Exam: Review of Systems  Psychiatric/Behavioral: The patient is nervous/anxious and has insomnia.   All other systems reviewed and are negative.   There were no vitals taken for this visit.There is no height or weight on file to calculate BMI.  General Appearance: Casual  Eye Contact:  Fair  Speech:  Clear and Coherent  Volume:  Normal  Mood:  Anxious  Affect:  Congruent  Thought Process:  Goal Directed and Descriptions of Associations: Intact  Orientation:  Full (Time, Place, and Person)  Thought Content: Logical   Suicidal Thoughts:  No  Homicidal Thoughts:  No  Memory:  Immediate;   Fair Recent;   Fair Remote;   Fair  Judgement:  Fair  Insight:  Fair  Psychomotor Activity:  Normal  Concentration:  Concentration: Fair and Attention Span: Fair  Recall:  [employer] of Knowledge: Fair  Language: Fair  Akathisia:  No  Handed:  Right  AIMS (if indicated): Denies tremors, rigidity  Assets:  Communication Skills Desire for Improvement Social Support  ADL's:  Intact  Cognition: WNL  Sleep:  Poor   Screenings: PHQ2-9     Office Visit from 10/16/2018 in Primary Care at [facility] from 07/13/2018 in [place] at [facility]  PHQ-2 Total Score  0  3  PHQ-9 Total Score  -  16       Assessment and Plan: Rachel Luna is a 63 year old Caucasian female, divorced, disabled, lives in [place], has a history of bipolar disorder, anxiety disorder, insomnia, seizure disorder, neuropathy, chronic pain, morbid obesity was evaluated by telemedicine today.  Patient continues to struggle with mood lability and sleep problems.  She however is not interested in changing her mood stabilizer today and wants to get genetic testing done.  Will make  changes with her sleep medications.  Plan as noted below.  Plan Bipolar disorder-unstable Cymbalta 90 mg p.o. daily Tegretol 100 mg p.o. daily-prescribed by neurology for seizures currently being tapered  down.   Anxiety disorder-unstable Cymbalta as prescribed Discontinue propranolol for noncompliance Continue CBT with [name]  Insomnia-restless Start Belsomra 5 mg p.o. nightly as needed  Pending genetic testing.  Follow-up in clinic in 4 weeks or sooner if needed.  Nov 12, 1:30 PM  I have spent atleast 15 minutes non  face to face with patient today. More than 50 % of the time was spent for psychoeducation and supportive psychotherapy and care coordination. This Luna was generated in part or whole with voice recognition software. Voice recognition is usually quite accurate but there are transcription errors that can and very often do occur. I apologize for any typographical errors that were not detected and corrected.       Ursula Alert, MD 12/27/2018, 12:27 PM

## 2018-12-28 ENCOUNTER — Telehealth: Payer: Self-pay

## 2018-12-28 DIAGNOSIS — F5105 Insomnia due to other mental disorder: Secondary | ICD-10-CM

## 2018-12-28 MED ORDER — BELSOMRA 5 MG PO TABS: 5.0000 mg | ORAL_TABLET | Freq: Every evening | ORAL | 1 refills | Status: DC | PRN

## 2018-12-28 NOTE — Telephone Encounter (Signed)
Received request from pharmacy that they need Belsomra-30 pills. We will send Belsomra-#30 to pharmacy.

## 2018-12-28 NOTE — Telephone Encounter (Signed)
called left message that medication comes in a pk of #30 and they need a new rx with correct amout.     Suvorexant (BELSOMRA) 5 MG TABS Medication Date: 12/27/2018 Department: [facility] Ordering/Authorizing: Ursula Alert, MD  Order Providers  Prescribing Provider Encounter Provider  Ursula Alert, MD Ursula Alert, MD  Outpatient Medication Detail   Disp Refills Start End   Suvorexant [facility]) 5 MG TABS 15 tablet 1 12/27/2018    Sig - Route: Take 5 mg by mouth at bedtime as needed. sleep - Oral   Sent to pharmacy as: Suvorexant (BELSOMRA) 5 MG Tab   E-Prescribing Status: Receipt confirmed by pharmacy (12/27/2018 11:01 AM EDT)   Associated Diagnoses  Insomnia due to mental disorder     [place], [place].

## 2019-01-18 ENCOUNTER — Ambulatory Visit (INDEPENDENT_AMBULATORY_CARE_PROVIDER_SITE_OTHER): Payer: Medicare Other | Admitting: Psychiatry

## 2019-01-18 ENCOUNTER — Other Ambulatory Visit: Payer: Self-pay

## 2019-01-18 ENCOUNTER — Encounter: Payer: Self-pay | Admitting: Psychiatry

## 2019-01-18 DIAGNOSIS — F411 Generalized anxiety disorder: Secondary | ICD-10-CM | POA: Diagnosis not present

## 2019-01-18 DIAGNOSIS — F5105 Insomnia due to other mental disorder: Secondary | ICD-10-CM | POA: Diagnosis not present

## 2019-01-18 DIAGNOSIS — F3160 Bipolar disorder, current episode mixed, unspecified: Secondary | ICD-10-CM

## 2019-01-18 NOTE — Progress Notes (Signed)
Virtual Visit via Video Note  I connected with Rachel Luna on 01/18/19 at  1:30 PM EST by a video enabled telemedicine application and verified that I am speaking with the correct person using two identifiers.   I discussed the limitations of evaluation and management by telemedicine and the availability of in person appointments. The patient expressed understanding and agreed to proceed.   I discussed the assessment and treatment plan with the patient. The patient was provided an opportunity to ask questions and all were answered. The patient agreed with the plan and demonstrated an understanding of the instructions.   The patient was advised to call back or seek an in-person evaluation if the symptoms worsen or if the condition fails to improve as anticipated.   [place] MD OP Progress Note  01/18/2019 1:34 PM Rachel Luna  MRN:  [id number]  Chief Complaint:  Chief Complaint    Follow-up     HPI: Rachel Luna is a 63 year old Caucasian female, divorced, disabled, lives in [place], has a history of bipolar disorder, GAD, insomnia, seizure disorder, morbid obesity, chronic pain, neuropathy was evaluated by telemedicine today.  Patient today reports she is currently struggling with tooth infection, has facial swelling and is awaiting an appointment with her dentist.  She is currently anxious about the situation.  She also reports feeling depressed since her dad who is at a nursing facility got diagnosed with COVID-19 a week ago.  The holidays are coming and she reports she does not have any family to spend it with and that also makes her sad.  She however reports the Belsomra was effective for her sleep.  She is sleeping better.  Denies side effects.  She wants to stay on the Belsomra at this dosage and is not interested in dosage increase.  She continues to have therapy sessions with her therapist [name] and reports she has upcoming appointment scheduled.  The therapy sessions are  beneficial.  Patient is not interested in new medication changes today more so because of her history of side effects to multiple medication trials.  She reports she will let writer know if she is interested in any medications changes in the future.  She denies any suicidality, homicidality or perceptual disturbances.   Visit Diagnosis:    ICD-10-CM   1. Bipolar I disorder, most recent episode mixed ([place])  F31.60   2. GAD (generalized anxiety disorder)  F41.1   3. Insomnia due to mental disorder  F51.05     Past Psychiatric History: I have reviewed past psychiatric history from my progress note on 10/19/2017.  Past trials of Abilify, Klonopin, Elavil, Cymbalta, Tegretol, Effexor, trazodone  Past Medical History:  Past Medical History:  Diagnosis Date  . Anxiety   . Arthritis   . Bipolar disorder ([place])   . chronic low back pain   . Chronic low back pain 02/18/2014  . Depression   . Diabetes mellitus without complication ([place])   . Diabetes mellitus, type II ([place])   . Fibroids   . GERD (gastroesophageal reflux disease)   . Gout   . Hyperlipidemia   . Hypertension   . Obesity     Past Surgical History:  Procedure Laterality Date  . ABDOMINAL HYSTERECTOMY    . CHOLECYSTECTOMY    . FOOT SURGERY Left   . KNEE ARTHROPLASTY    . OVARIAN CYST REMOVAL    . SPINE SURGERY      Family Psychiatric History: Reviewed family psychiatric history from my  progress note on 10/19/2017 Family History:  Family History  Problem Relation Age of Onset  . Diabetes Mother   . Cancer Mother        breast  . Heart disease Mother   . Mental illness Mother        bipolar disorder  . Stroke Mother   . Breast cancer Mother   . Hypertension Mother   . High Cholesterol Mother   . Diabetes Father   . Hypertension Father   . Diabetes Brother   . Bipolar disorder Brother   . Diabetes Brother   . Breast cancer Maternal Aunt   . Breast cancer Maternal Grandmother     Social History: Reviewed  social history from my progress note on 10/19/2017 Social History   Socioeconomic History  . Marital status: Divorced    Spouse name: Not on file  . Number of children: 0  . Years of education: 10  . Highest education level: 10th grade  Occupational History    Employer: UNEMPLOYED  Social Needs  . Financial resource strain: Very hard  . Food insecurity    Worry: Often true    Inability: Often true  . Transportation needs    Medical: No    Non-medical: No  Tobacco Use  . Smoking status: Passive Smoke Exposure - Never Smoker  . Smokeless tobacco: Never Used  . Tobacco comment: room mate smokes  Substance and Sexual Activity  . Alcohol use: No    Alcohol/week: 0.0 standard drinks  . Drug use: No  . Sexual activity: Not Currently  Lifestyle  . Physical activity    Days per week: 0 days    Minutes per session: 0 min  . Stress: Very much  Relationships  . Social Herbalist on phone: Three times a week    Gets together: Once a week    Attends religious service: 1 to 4 times per year    Active member of club or organization: No    Attends meetings of clubs or organizations: Never    Relationship status: Divorced  Other Topics Concern  . Not on file  Social History Narrative   Patient is divorced and lives with a roommate.   Patient is on disability.   Patient has a 10th grade education.   Patient is right-handed.   Patient drinks 6 sodas daily.          Allergies:  Allergies  Allergen Reactions  . Wellbutrin [Bupropion] Other (See Comments)    Irritability and Agitation  . Geodon [Ziprasidone Hcl] Other (See Comments)    Worsened Restless Leg Syndrome  . Lisinopril Cough  . Neupro [Rotigotine] Rash    Metabolic Disorder Labs: Lab Results  Component Value Date   HGBA1C 7.9 (A) 10/16/2018   No results found for: PROLACTIN Lab Results  Component Value Date   CHOL 172 10/16/2018   TRIG 164 (H) 10/16/2018   HDL 64 10/16/2018   CHOLHDL 2.7  10/16/2018   VLDL 41 (H) 03/25/2014   LDLCALC 75 10/16/2018   LDLCALC 68 07/13/2018   Lab Results  Component Value Date   TSH 2.030 07/13/2018    Therapeutic Level Labs: No results found for: LITHIUM No results found for: VALPROATE No components found for:  CBMZ  Current Medications: Current Outpatient Medications  Medication Sig Dispense Refill  . diclofenac Sodium (VOLTAREN) 1 % GEL Place onto the skin.    Marland Kitchen ACCU-CHEK AVIVA PLUS test strip Use in the morning and  after meals as needed. 100 each 5  . acetaminophen-codeine (TYLENOL #3) 300-30 MG tablet Take 1 tablet by mouth 3 (three) times daily as needed.    . baclofen (LIORESAL) 10 MG tablet TAKE 1 TABLET BY MOUTH 3 TIMES DAILY 270 each 1  . carbamazepine (TEGRETOL) 200 MG tablet TAKE 1/2 TABLET BY MOUTH ONCE DAILY 45 tablet 1  . cetirizine (ZYRTEC) 10 MG tablet Take 10 mg by mouth daily.    . chlorhexidine (PERIDEX) 0.12 % solution     . clonazePAM (KLONOPIN) 0.5 MG tablet Take 1 tablet (0.5 mg total) by mouth at bedtime. 90 tablet 1  . DULoxetine (CYMBALTA) 30 MG capsule Take 1 capsule (30 mg total) by mouth daily. To be combined with 60 mg 90 capsule 1  . DULoxetine (CYMBALTA) 60 MG capsule Take 1 capsule (60 mg total) by mouth daily. 90 capsule 1  . estradiol (ESTRACE) 0.5 MG tablet Take 1 tablet (0.5 mg total) by mouth daily. 30 tablet 11  . gabapentin (NEURONTIN) 800 MG tablet TAKE 1 TABLET BY MOUTH 3 TIMES DAILY 270 tablet 1  . glimepiride (AMARYL) 4 MG tablet Take 1 tablet (4 mg total) by mouth daily with breakfast. 90 tablet 1  . JANUVIA 50 MG tablet TAKE 1 TABLET BY MOUTH ONCE DAILY 90 tablet 1  . losartan-hydrochlorothiazide (HYZAAR) 100-25 MG tablet Take 1 tablet by mouth daily. 90 tablet 3  . meloxicam (MOBIC) 15 MG tablet Take 15 mg daily by mouth.     . pramipexole (MIRAPEX) 0.75 MG tablet TAKE 1 TABLET BY MOUTH ONCE EVERY MORNING AND 2 TABLET EVERY EVENING 270 tablet 2  . rosuvastatin (CRESTOR) 40 MG tablet Take 1  tablet (40 mg total) by mouth daily. 30 tablet 3  . Suvorexant (BELSOMRA) 5 MG TABS Take 5 mg by mouth at bedtime as needed. sleep 30 tablet 1   No current facility-administered medications for this visit.      Musculoskeletal: Strength & Muscle Tone: UTA Gait & Station: normal Patient leans: N/A  Psychiatric Specialty Exam: Review of Systems  HENT:       Tooth ache and facial swelling  Psychiatric/Behavioral: Positive for depression.  All other systems reviewed and are negative.   There were no vitals taken for this visit.There is no height or weight on file to calculate BMI.  General Appearance: Casual  Eye Contact:  Fair  Speech:  Clear and Coherent  Volume:  Normal  Mood:  Depressed  Affect:  Congruent  Thought Process:  Goal Directed and Descriptions of Associations: Intact  Orientation:  Full (Time, Place, and Person)  Thought Content: Logical   Suicidal Thoughts:  No  Homicidal Thoughts:  No  Memory:  Immediate;   Fair Recent;   Fair Remote;   Fair  Judgement:  Fair  Insight:  Fair  Psychomotor Activity:  Normal  Concentration:  Concentration: Fair and Attention Span: Fair  Recall:  [employer] of Knowledge: Fair  Language: Fair  Akathisia:  No  Handed:  Right  AIMS (if indicated): UTA  Assets:  Communication Skills Desire for Improvement Housing Transportation  ADL's:  Intact  Cognition: WNL  Sleep:  improving   Screenings: PHQ2-9     Office Visit from 10/16/2018 in [facility] at [place] from 07/13/2018 in [place] at [facility] Total Score  0  3  PHQ-9 Total Score  -  16       Assessment and Plan: Rachel Luna is a 63 year old  Caucasian female, divorced, disabled, lives in [place], has a history of bipolar disorder, anxiety disorder, insomnia, seizure disorder, neuropathy, chronic pain, morbid obesity was evaluated by telemedicine today.  Patient today is struggling with pain due to dental problems.  Patient however reports sleep is  improved.  She continues to have psychosocial stressors of social isolation, the current pandemic, her dad's health issues-recent diagnosis of COVID-19.  Patient will continue to benefit from psychotherapy sessions.  Plan Bipolar disorder-some progress Cymbalta 90 mg p.o. daily Tegretol 100 mg p.o. daily-prescribed by neurology for seizure currently being tapered down. Patient declines further medication changes  Anxiety disorder-some progress Cymbalta as prescribed Discontinued propranolol for noncompliance Continue CBT with [name]  Insomnia-improving Belsomra 5 mg p.o. nightly as needed  [employer] testing results-pending  Follow-up in clinic in 4 weeks or sooner if needed.  December 10 at 4:15 PM  I have spent atleast 15 minutes non face to face with patient today. More than 50 % of the time was spent for psychoeducation and supportive psychotherapy and care coordination. This note was generated in part or whole with voice recognition software. Voice recognition is usually quite accurate but there are transcription errors that can and very often do occur. I apologize for any typographical errors that were not detected and corrected.       Ursula Alert, MD 01/18/2019, 1:34 PM

## 2019-01-19 ENCOUNTER — Ambulatory Visit: Payer: Medicare Other

## 2019-01-28 ENCOUNTER — Other Ambulatory Visit: Payer: Self-pay | Admitting: Neurology

## 2019-01-29 ENCOUNTER — Other Ambulatory Visit: Payer: Self-pay | Admitting: Neurology

## 2019-01-31 IMAGING — CT CT ANGIO NECK
2 of 11 series · 8 of 33 positions shown · IV contrast (omnipaque)
Comparison: CT head 10/11/2008.

CLINICAL DATA: Ataxia, unable to stay awake. Lower extremity
cellulitis.

EXAM:
CT ANGIOGRAPHY HEAD AND NECK
TECHNIQUE: Multidetector CT imaging of the head and neck was performed using
the standard protocol during bolus administration of intravenous
contrast. Multiplanar CT image reconstructions and MIPs were
obtained to evaluate the vascular anatomy. Carotid stenosis
measurements (when applicable) are obtained utilizing NASCET
criteria, using the distal internal carotid diameter as the
denominator.
CONTRAST:  60mL OMNIPAQUE IOHEXOL 350 MG/ML SOLN

[Series 8: cta head neck · axial · 0.48mm/px · z∈[-418,-74]mm · 3 of 173 slices shown]
[im 1/173  soft-tissue]
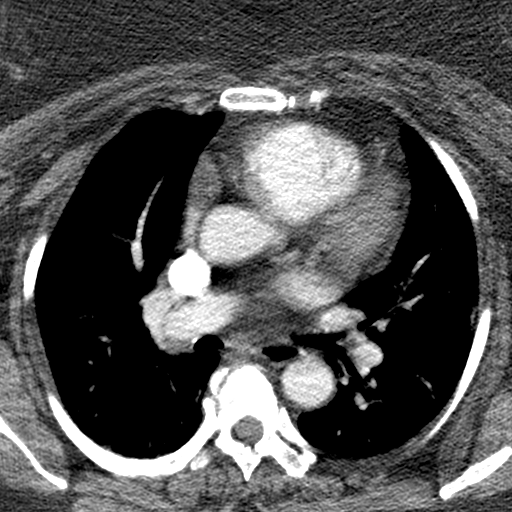
[im 87/173  bone]
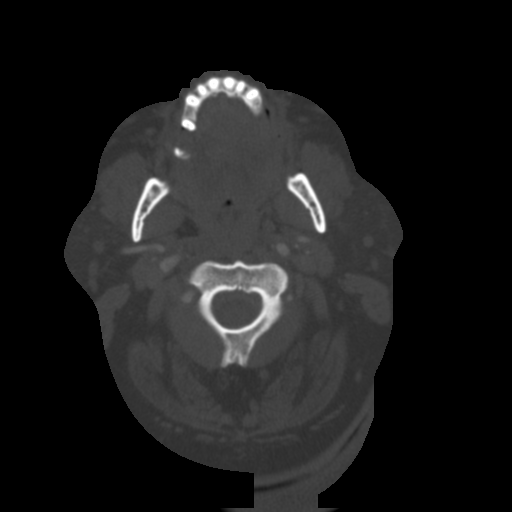
[im 173/173  soft-tissue]
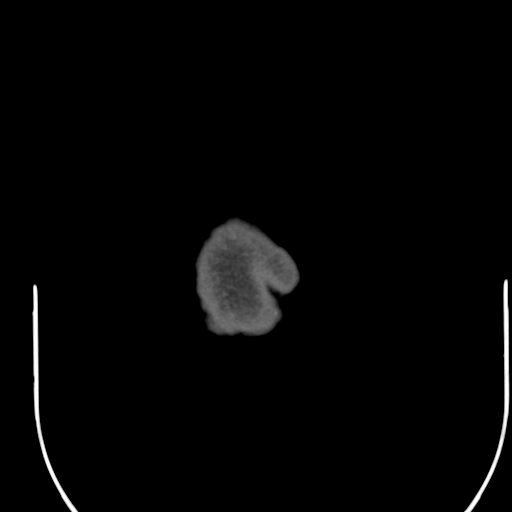

[Series 10: ax thin · axial · 0.49mm/px · z∈[-364,-137]mm · 5 of 341 slices shown]
[im 57/341  soft-tissue]
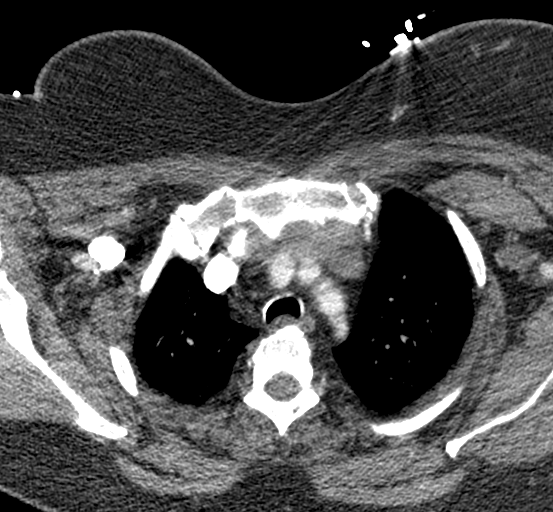
[im 114/341  soft-tissue]
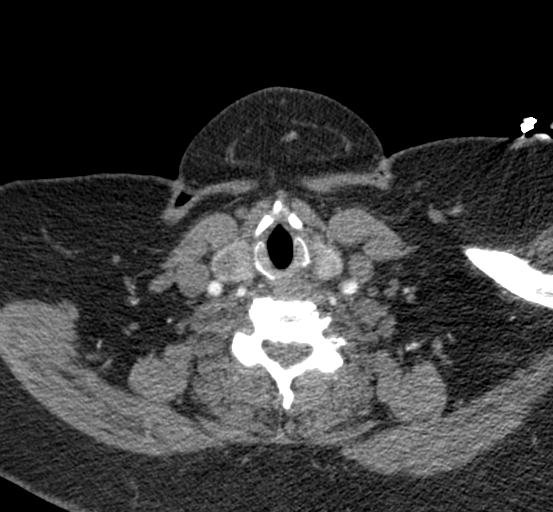
[im 171/341  soft-tissue]
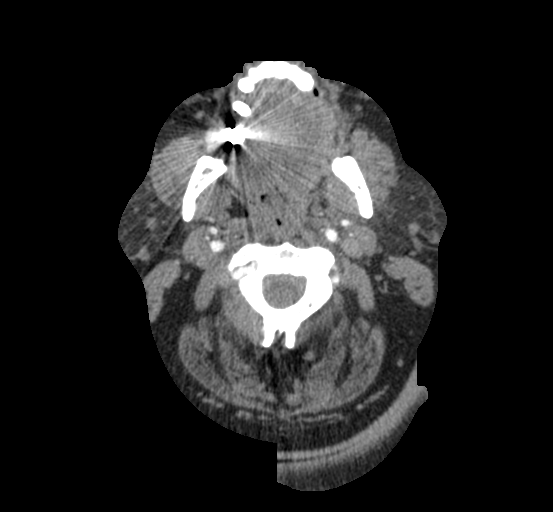
[im 227/341  soft-tissue]
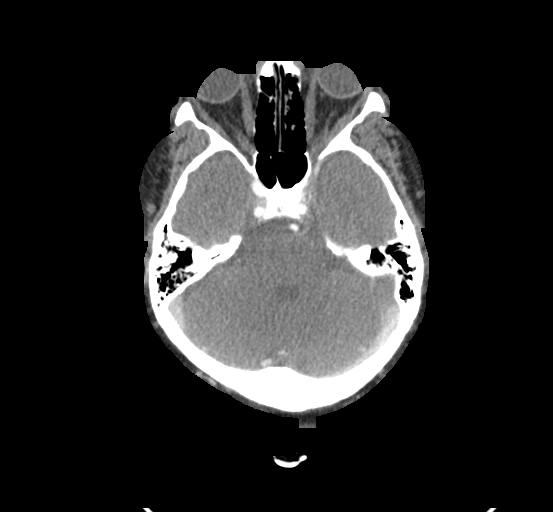
[im 284/341  soft-tissue]
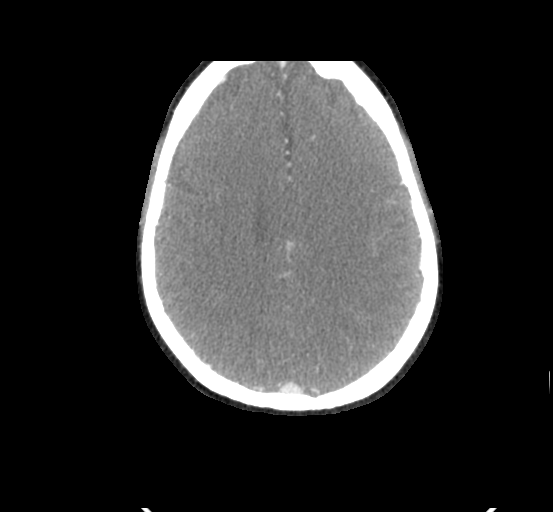

[8 of 33 positions shown; findings below may reference images not displayed]

FINDINGS: CT HEAD FINDINGS

Brain: No evidence of acute infarction, hemorrhage, hydrocephalus,
extra-axial collection or mass lesion/mass effect.

Vascular: Reported separately.

Skull: No worrisome osseous lesion.

Sinuses: No layering fluid or significant opacity.

Orbits: Negative.

Review of the MIP images confirms the above findings

CTA NECK FINDINGS

Aortic arch: Variant branching. LEFT vertebral arises from the arch.
No evidence of aneurysm or dissection. No significant stenosis of
the great vessels.

Right carotid system: No evidence of dissection, stenosis (50% or
greater) or occlusion.

Left carotid system: Nonstenotic plaque. No evidence of dissection,
stenosis, or occlusion.

Vertebral arteries: RIGHT vertebral dominant, both patent. No
dissection, stenosis, or occlusion.

Skeleton: Spondylosis.

Other neck: RIGHT thyroid nodule, 11 x 12 mm. Consider elective
sonographic evaluation

Upper chest: Mild vascular congestion.

Review of the MIP images confirms the above findings

CTA HEAD FINDINGS

Anterior circulation: No significant stenosis, proximal occlusion,
aneurysm, or vascular malformation.

Posterior circulation: No significant stenosis, proximal occlusion,
aneurysm, or vascular malformation.

Venous sinuses: As permitted by contrast timing, patent.

Anatomic variants: BILATERAL fetal PCA. LEFT vertebral contributes
predominantly to PICA.

Delayed phase: No abnormal postcontrast enhancement.

Review of the MIP images confirms the above findings
IMPRESSION: No intracranial or extracranial flow reducing stenosis, dissection,
or occlusion.

No acute intracranial findings. No abnormal postcontrast
enhancement.

## 2019-02-05 ENCOUNTER — Other Ambulatory Visit: Payer: Self-pay | Admitting: *Deleted

## 2019-02-05 DIAGNOSIS — E1165 Type 2 diabetes mellitus with hyperglycemia: Secondary | ICD-10-CM

## 2019-02-05 MED ORDER — GLIMEPIRIDE 4 MG PO TABS: 4.0000 mg | ORAL_TABLET | Freq: Every day | ORAL | 1 refills | Status: DC

## 2019-02-14 NOTE — Progress Notes (Addendum)
PATIENT: Rachel Luna DOB: Oct 08, 1955  REASON FOR VISIT: follow up HISTORY FROM: patient  HISTORY OF PRESENT ILLNESS: Today 02/15/19  Rachel Luna is a 63 year old female with history of bipolar disorder, chronic low back pain, restless leg syndrome, remote history of seizures and obesity.  She remains on Mirapex and clonazepam for restless leg syndrome.  She is also taking carbamazepine and gabapentin.  The addition of clonazepam has been helpful for her restless legs.  She reports that her right leg is more bothersome than the left.  Her father is now in a facility.  She lives with her roommate, she does not drive a car.  She admits she is not very active during the day.  She is also taking baclofen for muscle spasms in her legs and back.  She has not had any recent falls, but she does have issues with balance.  She has not had recent seizure. She reports her mental health has been under good control, she sees a therapist regularly.  She presents today for evaluation unaccompanied.  HISTORY 08/17/2018 Dr. Jannifer Franklin: Rachel Luna is a 63 year old right-handed white female with a history of bipolar disorder, chronic low back pain, restless leg syndrome, remote history of seizures and obesity.  The patient has had a recent increase in her low back pain as she is caring for her demented father, he is hallucinating and agitated at night, she is not sleeping well.  She is also helping a friend with moving, packing boxes.  This has increased her pain recently.  In regards to her restless leg syndrome, the recent addition of clonazepam 0.5 mg at night has been helpful, she is sleeping better, she also takes Mirapex 0.75 mg tablets taking 1 in the morning and 2 in the evening.  The patient remains on carbamazepine and gabapentin.  She otherwise is doing well with her medical issues.  REVIEW OF SYSTEMS: Out of a complete 14 system review of symptoms, the patient complains only of the following symptoms, and all  other reviewed systems are negative.  Seizure, back pain, restless legs  ALLERGIES: Allergies  Allergen Reactions  . Wellbutrin [Bupropion] Other (See Comments)    Irritability and Agitation  . Geodon [Ziprasidone Hcl] Other (See Comments)    Worsened Restless Leg Syndrome  . Lisinopril Cough  . Neupro [Rotigotine] Rash    HOME MEDICATIONS: Outpatient Medications Prior to Visit  Medication Sig Dispense Refill  . ACCU-CHEK AVIVA PLUS test strip Use in the morning and after meals as needed. 100 each 5  . baclofen (LIORESAL) 10 MG tablet TAKE 1 TABLET BY MOUTH 3 TIMES DAILY 270 each 1  . carbamazepine (TEGRETOL) 200 MG tablet TAKE 1/2 TABLET BY MOUTH ONCE DAILY 45 tablet 1  . cetirizine (ZYRTEC) 10 MG tablet Take 10 mg by mouth daily.    . clonazePAM (KLONOPIN) 0.5 MG tablet Take 1 tablet (0.5 mg total) by mouth at bedtime. 90 tablet 1  . diclofenac Sodium (VOLTAREN) 1 % GEL Place onto the skin.    . DULoxetine (CYMBALTA) 30 MG capsule Take 1 capsule (30 mg total) by mouth daily. To be combined with 60 mg 90 capsule 1  . DULoxetine (CYMBALTA) 60 MG capsule Take 1 capsule (60 mg total) by mouth daily. 90 capsule 1  . estradiol (ESTRACE) 0.5 MG tablet Take 1 tablet (0.5 mg total) by mouth daily. 30 tablet 11  . gabapentin (NEURONTIN) 800 MG tablet TAKE 1 TABLET BY MOUTH 3 TIMES DAILY 270 tablet  1  . glimepiride (AMARYL) 4 MG tablet Take 1 tablet (4 mg total) by mouth daily with breakfast. 90 tablet 1  . JANUVIA 50 MG tablet TAKE 1 TABLET BY MOUTH ONCE DAILY 90 tablet 1  . meloxicam (MOBIC) 15 MG tablet Take 15 mg daily by mouth.     . pramipexole (MIRAPEX) 0.75 MG tablet TAKE 1 TABLET BY MOUTH ONCE EVERY MORNING AND 2 TABLETS ONCE EVERY EVENING 270 tablet 2  . rosuvastatin (CRESTOR) 40 MG tablet Take 1 tablet (40 mg total) by mouth daily. 30 tablet 3  . losartan-hydrochlorothiazide (HYZAAR) 100-25 MG tablet Take 1 tablet by mouth daily. 90 tablet 3  . acetaminophen-codeine (TYLENOL #3)  300-30 MG tablet Take 1 tablet by mouth 3 (three) times daily as needed.    . chlorhexidine (PERIDEX) 0.12 % solution     . Suvorexant (BELSOMRA) 5 MG TABS Take 5 mg by mouth at bedtime as needed. sleep 30 tablet 1   No facility-administered medications prior to visit.    PAST MEDICAL HISTORY: Past Medical History:  Diagnosis Date  . Anxiety   . Arthritis   . Bipolar disorder ([place])   . chronic low back pain   . Chronic low back pain 02/18/2014  . Depression   . Diabetes mellitus without complication ([place])   . Diabetes mellitus, type II ([place])   . Fibroids   . GERD (gastroesophageal reflux disease)   . Gout   . Hyperlipidemia   . Hypertension   . Obesity     PAST SURGICAL HISTORY: Past Surgical History:  Procedure Laterality Date  . ABDOMINAL HYSTERECTOMY    . CHOLECYSTECTOMY    . FOOT SURGERY Left   . KNEE ARTHROPLASTY    . OVARIAN CYST REMOVAL    . SPINE SURGERY      FAMILY HISTORY: Family History  Problem Relation Age of Onset  . Diabetes Mother   . Cancer Mother        breast  . Heart disease Mother   . Mental illness Mother        bipolar disorder  . Stroke Mother   . Breast cancer Mother   . Hypertension Mother   . High Cholesterol Mother   . Diabetes Father   . Hypertension Father   . Diabetes Brother   . Bipolar disorder Brother   . Diabetes Brother   . Breast cancer Maternal Aunt   . Breast cancer Maternal Grandmother     SOCIAL HISTORY: Social History   Socioeconomic History  . Marital status: Divorced    Spouse name: Not on file  . Number of children: 0  . Years of education: 10  . Highest education level: 10th grade  Occupational History    Employer: UNEMPLOYED  Tobacco Use  . Smoking status: Passive Smoke Exposure - Never Smoker  . Smokeless tobacco: Never Used  . Tobacco comment: room mate smokes  Substance and Sexual Activity  . Alcohol use: No    Alcohol/week: 0.0 standard drinks  . Drug use: No  . Sexual activity: Not  Currently  Other Topics Concern  . Not on file  Social History Narrative   Patient is divorced and lives with a roommate.   Patient is on disability.   Patient has a 10th grade education.   Patient is right-handed.   Patient drinks 6 sodas daily.         Social Determinants of Health   Financial Resource Strain: High Risk  . Difficulty of Paying  Living Expenses: Very hard  Food Insecurity: Food Insecurity Present  . Worried About Charity fundraiser in the Last Year: Often true  . Ran Out of Food in the Last Year: Often true  Transportation Needs: No Transportation Needs  . Lack of Transportation (Medical): No  . Lack of Transportation (Non-Medical): No  Physical Activity: Inactive  . Days of Exercise per Week: 0 days  . Minutes of Exercise per Session: 0 min  Stress: Stress Concern Present  . Feeling of Stress : Very much  Social Connections: Somewhat Isolated  . Frequency of Communication with Friends and Family: Three times a week  . Frequency of Social Gatherings with Friends and Family: Once a week  . Attends Religious Services: 1 to 4 times per year  . Active Member of Clubs or Organizations: No  . Attends Archivist Meetings: Never  . Marital Status: Divorced  Human resources officer Violence: Not At Risk  . Fear of Current or Ex-Partner: No  . Emotionally Abused: No  . Physically Abused: No  . Sexually Abused: No      PHYSICAL EXAM  Vitals:   02/15/19 0949  BP: 137/78  Pulse: 84  Temp: (!) 97.2 F (36.2 C)  TempSrc: Oral  Weight: 284 lb 3.2 oz (128.9 kg)  Height: 5\' 4"  (1.626 m)   Body mass index is 48.78 kg/m.  Generalized: Well developed, in no acute distress   Neurological examination  Mentation: Alert oriented to time, place, history taking. Follows all commands speech and language fluent Cranial nerve II-XII: Pupils were equal round reactive to light. Extraocular movements were full, visual field were full on confrontational test. Facial  sensation and strength were normal.  Head turning and shoulder shrug  were normal and symmetric. Motor: Good strength of all extremities Sensory: Sensory testing is intact to soft touch on all 4 extremities. No evidence of extinction is noted.  Coordination: Cerebellar testing reveals good finger-nose-finger and heel-to-shin bilaterally.  Gait and station: Gait is normal.  Patient did not feel comfortable performing tandem gait. Reflexes: Deep tendon reflexes are symmetric   DIAGNOSTIC DATA (LABS, IMAGING, TESTING) - I reviewed patient records, labs, notes, testing and imaging myself where available.  Lab Results  Component Value Date   WBC 7.9 10/16/2018   HGB 13.8 10/16/2018   HCT 41.2 10/16/2018   MCV 85 10/16/2018   PLT 230 10/16/2018      Component Value Date/Time   NA 141 10/16/2018 1041   K 3.2 (L) 10/16/2018 1041   CL 93 (L) 10/16/2018 1041   CO2 27 10/16/2018 1041   GLUCOSE 117 (H) 10/16/2018 1041   GLUCOSE 187 (H) 11/14/2017 0958   GLUCOSE 126 (H) 03/25/2014 0811   BUN 19 10/16/2018 1041   CREATININE 0.78 10/16/2018 1041   CALCIUM 9.8 10/16/2018 1041   PROT 6.7 10/16/2018 1041   ALBUMIN 4.1 10/16/2018 1041   AST 19 10/16/2018 1041   ALT 18 10/16/2018 1041   ALKPHOS 83 10/16/2018 1041   BILITOT <0.2 10/16/2018 1041   GFRNONAA 83 10/16/2018 1041   GFRAA 96 10/16/2018 1041   Lab Results  Component Value Date   CHOL 172 10/16/2018   HDL 64 10/16/2018   LDLCALC 75 10/16/2018   TRIG 164 (H) 10/16/2018   CHOLHDL 2.7 10/16/2018   Lab Results  Component Value Date   HGBA1C 7.9 (A) 10/16/2018   Lab Results  Component Value Date   VITAMINB12 294 04/12/2017   Lab Results  Component Value  Date   TSH 2.030 07/13/2018      ASSESSMENT AND PLAN 63 y.o. year old female  has a past medical history of Anxiety, Arthritis, Bipolar disorder ([place]), chronic low back pain, Chronic low back pain (02/18/2014), Depression, Diabetes mellitus without complication ([place]),  Diabetes mellitus, type II ([place]), Fibroids, GERD (gastroesophageal reflux disease), Gout, Hyperlipidemia, Hypertension, and Obesity. here with:  1.  Chronic low back pain 2.  Restless leg syndrome 3.  Remote history of seizures 4.  Possible small fiber neuropathy.  She will remain on her current medications including baclofen, carbamazepine, Klonopin, and Mirapex.  I will refill carbamazepine and Klonopin today.  I will check a carbamazepine level.  CBC and CMP were completed in August 2020 and were reviewed today.  She will follow-up in 8 months or sooner if needed.  I did advise her symptoms worsen or she develops any new symptoms she should let us know.   I spent 15 minutes with the patient. 50% of this time was spent discussing her plan of care.  Butler Denmark, AGNP-C, DNP 02/15/2019, 9:52 AM [facility] [address], [place], [place] [address] [phone]  I have read the note, and I agree with the clinical assessment and plan.  Kathrynn Ducking

## 2019-02-15 ENCOUNTER — Encounter: Payer: Self-pay | Admitting: Psychiatry

## 2019-02-15 ENCOUNTER — Other Ambulatory Visit: Payer: Self-pay

## 2019-02-15 ENCOUNTER — Ambulatory Visit (INDEPENDENT_AMBULATORY_CARE_PROVIDER_SITE_OTHER): Payer: Medicare Other | Admitting: Psychiatry

## 2019-02-15 ENCOUNTER — Encounter: Payer: Self-pay | Admitting: Neurology

## 2019-02-15 ENCOUNTER — Ambulatory Visit (INDEPENDENT_AMBULATORY_CARE_PROVIDER_SITE_OTHER): Payer: Medicare Other | Admitting: Neurology

## 2019-02-15 VITALS — BP 137/78 | HR 84 | Temp 97.2°F | Ht 64.0 in | Wt 284.2 lb

## 2019-02-15 DIAGNOSIS — G2581 Restless legs syndrome: Secondary | ICD-10-CM

## 2019-02-15 DIAGNOSIS — F411 Generalized anxiety disorder: Secondary | ICD-10-CM | POA: Diagnosis not present

## 2019-02-15 DIAGNOSIS — G8929 Other chronic pain: Secondary | ICD-10-CM

## 2019-02-15 DIAGNOSIS — F5105 Insomnia due to other mental disorder: Secondary | ICD-10-CM | POA: Diagnosis not present

## 2019-02-15 DIAGNOSIS — F3160 Bipolar disorder, current episode mixed, unspecified: Secondary | ICD-10-CM | POA: Diagnosis not present

## 2019-02-15 DIAGNOSIS — M545 Low back pain: Secondary | ICD-10-CM

## 2019-02-15 DIAGNOSIS — G40909 Epilepsy, unspecified, not intractable, without status epilepticus: Secondary | ICD-10-CM

## 2019-02-15 MED ORDER — CARBAMAZEPINE 200 MG PO TABS: 100.0000 mg | ORAL_TABLET | Freq: Every day | ORAL | 1 refills | Status: DC

## 2019-02-15 MED ORDER — CLONAZEPAM 0.5 MG PO TABS: 0.5000 mg | ORAL_TABLET | Freq: Every day | ORAL | 3 refills | Status: DC

## 2019-02-15 NOTE — Progress Notes (Signed)
Virtual Visit via Video Note  I connected with Rachel Luna on 02/15/19 at  4:45 PM EST by a video enabled telemedicine application and verified that I am speaking with the correct person using two identifiers.   I discussed the limitations of evaluation and management by telemedicine and the availability of in person appointments. The patient expressed understanding and agreed to proceed.  I discussed the assessment and treatment plan with the patient. The patient was provided an opportunity to ask questions and all were answered. The patient agreed with the plan and demonstrated an understanding of the instructions.   The patient was advised to call back or seek an in-person evaluation if the symptoms worsen or if the condition fails to improve as anticipated.   [facility] MD OP Progress Note  02/15/2019 5:15 PM Rachel Luna  MRN:  [id number]  Chief Complaint:  Chief Complaint    Follow-up     HPI: Shakeita is a 63 year old Caucasian female, divorced, disabled, lives in [place], has a history of bipolar disorder, GAD, insomnia, seizure disorder, morbid obesity, chronic pain, neuropathy was evaluated by telemedicine today.  A video call was initiated however due to connection problem it had to be changed to a phone call.  Patient today reports she is currently not taking the [place] which was prescribed last visit.  She reports she had vivid dreams from the [place] and hence stopped taking it.  She continues to struggle with sleep.  She reports she used to work third shift when she used to work in the past.  She hence likes staying up at night.  She reports if she is tired enough and has a lot of activities that she may have done during the day she is able to sleep at night.  Otherwise she spends her night doing chores around the house.  She reports she is able to sleep part of the night.  She is not interested in any sleep medications today.  Patient reports she continues to struggle with mood  swings, is anxious about the current situations.  Holidays makes her sad especially this year she is going to be alone and has no family around.  She however reports she does not want to be on any new medications at this time.  She was unable to get the GeneSight testing done, reports she cannot afford it.  Patient denies any suicidality, homicidality or perceptual disturbances.  Patient today reports she wants to continue to work with her therapist [name] and is not interested in any more medication changes. Visit Diagnosis:    ICD-10-CM   1. Bipolar I disorder, most recent episode mixed ([place])  F31.60   2. GAD (generalized anxiety disorder)  F41.1   3. Insomnia due to mental disorder  F51.05     Past Psychiatric History: I have reviewed past psychiatric history from my progress note on 10/19/2017.  Past trials of Abilify, Klonopin, Elavil, Cymbalta, Tegretol, Effexor, trazodone, Belsomra.  Past Medical History:  Past Medical History:  Diagnosis Date  . Anxiety   . Arthritis   . Bipolar disorder ([place])   . chronic low back pain   . Chronic low back pain 02/18/2014  . Depression   . Diabetes mellitus without complication ([place])   . Diabetes mellitus, type II ([place])   . Fibroids   . GERD (gastroesophageal reflux disease)   . Gout   . Hyperlipidemia   . Hypertension   . Obesity     Past Surgical  History:  Procedure Laterality Date  . ABDOMINAL HYSTERECTOMY    . CHOLECYSTECTOMY    . FOOT SURGERY Left   . KNEE ARTHROPLASTY    . OVARIAN CYST REMOVAL    . SPINE SURGERY      Family Psychiatric History: I have reviewed family psychiatric history from my progress note on 10/19/2017.  Family History:  Family History  Problem Relation Age of Onset  . Diabetes Mother   . Cancer Mother        breast  . Heart disease Mother   . Mental illness Mother        bipolar disorder  . Stroke Mother   . Breast cancer Mother   . Hypertension Mother   . High Cholesterol Mother   .  Diabetes Father   . Hypertension Father   . Diabetes Brother   . Bipolar disorder Brother   . Diabetes Brother   . Breast cancer Maternal Aunt   . Breast cancer Maternal Grandmother     Social History: I have reviewed social history from my progress note on 10/19/2017. Social History   Socioeconomic History  . Marital status: Divorced    Spouse name: Not on file  . Number of children: 0  . Years of education: 10  . Highest education level: 10th grade  Occupational History    Employer: UNEMPLOYED  Tobacco Use  . Smoking status: Passive Smoke Exposure - Never Smoker  . Smokeless tobacco: Never Used  . Tobacco comment: room mate smokes  Substance and Sexual Activity  . Alcohol use: No    Alcohol/week: 0.0 standard drinks  . Drug use: No  . Sexual activity: Not Currently  Other Topics Concern  . Not on file  Social History Narrative   Patient is divorced and lives with a roommate.   Patient is on disability.   Patient has a 10th grade education.   Patient is right-handed.   Patient drinks 6 sodas daily.         Social Determinants of Health   Financial Resource Strain: High Risk  . Difficulty of Paying Living Expenses: Very hard  Food Insecurity: Food Insecurity Present  . Worried About Charity fundraiser in the Last Year: Often true  . Ran Out of Food in the Last Year: Often true  Transportation Needs: No Transportation Needs  . Lack of Transportation (Medical): No  . Lack of Transportation (Non-Medical): No  Physical Activity: Inactive  . Days of Exercise per Week: 0 days  . Minutes of Exercise per Session: 0 min  Stress: Stress Concern Present  . Feeling of Stress : Very much  Social Connections: Somewhat Isolated  . Frequency of Communication with Friends and Family: Three times a week  . Frequency of Social Gatherings with Friends and Family: Once a week  . Attends Religious Services: 1 to 4 times per year  . Active Member of Clubs or Organizations: No  .  Attends Archivist Meetings: Never  . Marital Status: Divorced    Allergies:  Allergies  Allergen Reactions  . Wellbutrin [Bupropion] Other (See Comments)    Irritability and Agitation  . Belsomra [Suvorexant]     Vivid dreams  . Geodon [Ziprasidone Hcl] Other (See Comments)    Worsened Restless Leg Syndrome  . Lisinopril Cough  . Neupro [Rotigotine] Rash    Metabolic Disorder Labs: Lab Results  Component Value Date   HGBA1C 7.9 (A) 10/16/2018   No results found for: PROLACTIN Lab Results  Component Value Date   CHOL 172 10/16/2018   TRIG 164 (H) 10/16/2018   HDL 64 10/16/2018   CHOLHDL 2.7 10/16/2018   VLDL 41 (H) 03/25/2014   LDLCALC 75 10/16/2018   LDLCALC 68 07/13/2018   Lab Results  Component Value Date   TSH 2.030 07/13/2018    Therapeutic Level Labs: No results found for: LITHIUM No results found for: VALPROATE No components found for:  CBMZ  Current Medications: Current Outpatient Medications  Medication Sig Dispense Refill  . ACCU-CHEK AVIVA PLUS test strip Use in the morning and after meals as needed. 100 each 5  . baclofen (LIORESAL) 10 MG tablet TAKE 1 TABLET BY MOUTH 3 TIMES DAILY 270 each 1  . carbamazepine (TEGRETOL) 200 MG tablet Take 0.5 tablets (100 mg total) by mouth daily. 45 tablet 1  . cetirizine (ZYRTEC) 10 MG tablet Take 10 mg by mouth daily.    . clonazePAM (KLONOPIN) 0.5 MG tablet Take 1 tablet (0.5 mg total) by mouth at bedtime. 30 tablet 3  . diclofenac Sodium (VOLTAREN) 1 % GEL Place onto the skin.    . DULoxetine (CYMBALTA) 30 MG capsule Take 1 capsule (30 mg total) by mouth daily. To be combined with 60 mg 90 capsule 1  . DULoxetine (CYMBALTA) 60 MG capsule Take 1 capsule (60 mg total) by mouth daily. 90 capsule 1  . estradiol (ESTRACE) 0.5 MG tablet Take 1 tablet (0.5 mg total) by mouth daily. 30 tablet 11  . gabapentin (NEURONTIN) 800 MG tablet TAKE 1 TABLET BY MOUTH 3 TIMES DAILY 270 tablet 1  . glimepiride (AMARYL) 4  MG tablet Take 1 tablet (4 mg total) by mouth daily with breakfast. 90 tablet 1  . JANUVIA 50 MG tablet TAKE 1 TABLET BY MOUTH ONCE DAILY 90 tablet 1  . losartan-hydrochlorothiazide (HYZAAR) 100-25 MG tablet Take 1 tablet by mouth daily. 90 tablet 3  . meloxicam (MOBIC) 15 MG tablet Take 15 mg daily by mouth.     . pramipexole (MIRAPEX) 0.75 MG tablet TAKE 1 TABLET BY MOUTH ONCE EVERY MORNING AND 2 TABLETS ONCE EVERY EVENING 270 tablet 2  . rosuvastatin (CRESTOR) 40 MG tablet Take 1 tablet (40 mg total) by mouth daily. 30 tablet 3   No current facility-administered medications for this visit.     Musculoskeletal: Strength & Muscle Tone: UTA Gait & Station: Reports as WNL Patient leans: N/A  Psychiatric Specialty Exam: Review of Systems  Psychiatric/Behavioral: Positive for sleep disturbance. The patient is nervous/anxious.   All other systems reviewed and are negative.   There were no vitals taken for this visit.There is no height or weight on file to calculate BMI.  General Appearance: UTA  Eye Contact:  UTA  Speech:  Clear and Coherent  Volume:  Normal  Mood:  Anxious  Affect:  UTA  Thought Process:  Goal Directed and Descriptions of Associations: Intact  Orientation:  Full (Time, Place, and Person)  Thought Content: Logical   Suicidal Thoughts:  No  Homicidal Thoughts:  No  Memory:  Immediate;   Fair Recent;   Fair Remote;   Fair  Judgement:  Fair  Insight:  Fair  Psychomotor Activity:  UTA  Concentration:  Concentration: Fair and Attention Span: Fair  Recall:  [employer] of Knowledge: Fair  Language: Fair  Akathisia:  No  Handed:  Right  AIMS (if indicated): denies tremors, rigidity  Assets:  Communication Skills Desire for Improvement Housing  ADL's:  Intact  Cognition: WNL  Sleep:  Restless   Screenings: PHQ2-9     Office Visit from 10/16/2018 in Primary Care at [facility] from 07/13/2018 in Primary Care at [facility] Total Score  0  3  PHQ-9  Total Score  --  16       Assessment and Plan: Kelliann is a 63 year old Caucasian female, divorced, disabled, lives in [place], has a history of bipolar disorder, anxiety disorder, insomnia, seizure disorder, neuropathy, chronic pain, morbid obesity was evaluated by telemedicine today.  Patient continues to struggle with sleep problems as well as mood lability.  Patient however reports she is not interested in medication changes and rather would like to continue psychotherapy sessions at this time.  Plan as noted below.  Plan Bipolar disorder-some progress Cymbalta 90 mg p.o. daily. Tegretol 100 mg p.o. daily-prescribed by neurology for seizures currently being tapered down. Patient declines any further medication changes.  Anxiety disorder-some progress Cymbalta as prescribed Continue CBT with [name]  Insomnia-unstable Patient advised to work on her sleep hygiene. Patient is not interested in any sleep medications at this time. Discontinue Belsomra for side effects.  Patient was referred for GeneSight testing however reports she cannot afford it today.  Follow-up in clinic in 3 months or sooner if needed.  March 4 at 10:20 AM. Patient in the meantime will continue to work with her therapist since she is not interested in medication changes.  I have spent atleast 10 minutes non face to face with patient today. More than 50 % of the time was spent for psychoeducation and supportive psychotherapy and care coordination. This note was generated in part or whole with voice recognition software. Voice recognition is usually quite accurate but there are transcription errors that can and very often do occur. I apologize for any typographical errors that were not detected and corrected.         Ursula Alert, MD 02/15/2019, 5:15 PM

## 2019-02-15 NOTE — Patient Instructions (Signed)
Continue current medications   I refilled carbmazepine, Klonopin today   I will check carbamazepine level   Return in 8 months or sooner if needed

## 2019-02-16 LAB — CARBAMAZEPINE LEVEL, TOTAL: Carbamazepine (Tegretol), S: 3 ug/mL — ABNORMAL LOW (ref 4.0–12.0)

## 2019-02-19 ENCOUNTER — Telehealth: Payer: Self-pay

## 2019-02-19 ENCOUNTER — Ambulatory Visit: Payer: Medicare Other | Admitting: Podiatry

## 2019-02-19 NOTE — Telephone Encounter (Signed)
Called pt to go over her recent lab results per NP Butler Denmark. Pt demonstrated understanding and did not have any questions.    "Please call the patient. Carbamazepine level is low. Will not alter dosing since doing well. Continue current dose. She has remote history of seizures." -NP Butler Denmark

## 2019-03-13 ENCOUNTER — Ambulatory Visit: Payer: Medicare Other | Admitting: Podiatry

## 2019-03-27 ENCOUNTER — Other Ambulatory Visit: Payer: Self-pay | Admitting: Emergency Medicine

## 2019-03-27 NOTE — Telephone Encounter (Signed)
Requested Prescriptions  Pending Prescriptions Disp Refills  . rosuvastatin (CRESTOR) 40 MG tablet [Pharmacy Med Name: ROSUVASTATIN CALCIUM 40 MG TAB] 30 tablet 3    Sig: TAKE 1 TABLET BY MOUTH ONCE DAILY     Cardiovascular:  Antilipid - Statins Failed - 03/27/2019  2:11 PM      Failed - Triglycerides in normal range and within 360 days    Triglycerides  Date Value Ref Range Status  10/16/2018 164 (H) 0 - 149 mg/dL Final  03/25/2014 204 (H) 0 - 200 mg/dL Final         Passed - Total Cholesterol in normal range and within 360 days    Cholesterol, Total  Date Value Ref Range Status  10/16/2018 172 100 - 199 mg/dL Final   Cholesterol  Date Value Ref Range Status  03/25/2014 220 (H) 0 - 200 mg/dL Final         Passed - LDL in normal range and within 360 days    Ldl Cholesterol, Calc  Date Value Ref Range Status  03/25/2014 104 (H) 0 - 100 mg/dL Final   LDL Calculated  Date Value Ref Range Status  10/16/2018 75 0 - 99 mg/dL Final         Passed - HDL in normal range and within 360 days    HDL Cholesterol  Date Value Ref Range Status  03/25/2014 75 (H) 40 - 60 mg/dL Final   HDL  Date Value Ref Range Status  10/16/2018 64 >39 mg/dL Final         Passed - Patient is not pregnant      Passed - Valid encounter within last 12 months    Recent Outpatient Visits          5 months ago Type 2 diabetes mellitus with hyperglycemia, without long-term current use of insulin ([place])   Primary Care at [facility], McG[place]or, MD   8 months ago Type 2 diabetes mellitus with hyperglycemia, without long-term current use of insulin [facility])   Primary Care at [facility], Ines Bloomer, MD      Future Appointments            In 3 weeks Sagardia, Ines Bloomer, MD Primary Care at [place]

## 2019-03-29 ENCOUNTER — Ambulatory Visit: Payer: Medicare Other | Admitting: Podiatry

## 2019-04-18 ENCOUNTER — Encounter: Payer: Self-pay | Admitting: Emergency Medicine

## 2019-04-18 ENCOUNTER — Telehealth (INDEPENDENT_AMBULATORY_CARE_PROVIDER_SITE_OTHER): Payer: Medicare Other | Admitting: Emergency Medicine

## 2019-04-18 ENCOUNTER — Other Ambulatory Visit: Payer: Self-pay

## 2019-04-18 VITALS — Ht 65.0 in | Wt 300.0 lb

## 2019-04-18 DIAGNOSIS — R6889 Other general symptoms and signs: Secondary | ICD-10-CM | POA: Diagnosis not present

## 2019-04-18 DIAGNOSIS — E1165 Type 2 diabetes mellitus with hyperglycemia: Secondary | ICD-10-CM

## 2019-04-18 DIAGNOSIS — Z20822 Contact with and (suspected) exposure to covid-19: Secondary | ICD-10-CM

## 2019-04-18 DIAGNOSIS — Z1231 Encounter for screening mammogram for malignant neoplasm of breast: Secondary | ICD-10-CM

## 2019-04-18 DIAGNOSIS — G2581 Restless legs syndrome: Secondary | ICD-10-CM | POA: Diagnosis not present

## 2019-04-18 MED ORDER — ROSUVASTATIN CALCIUM 40 MG PO TABS: 40.0000 mg | ORAL_TABLET | Freq: Every day | ORAL | 3 refills | Status: DC

## 2019-04-18 MED ORDER — GLIMEPIRIDE 4 MG PO TABS: 4.0000 mg | ORAL_TABLET | Freq: Every day | ORAL | 1 refills | Status: DC

## 2019-04-18 MED ORDER — GABAPENTIN 800 MG PO TABS: 800.0000 mg | ORAL_TABLET | Freq: Three times a day (TID) | ORAL | 1 refills | Status: DC

## 2019-04-18 NOTE — Progress Notes (Signed)
Scheduled

## 2019-04-18 NOTE — Progress Notes (Signed)
Telemedicine Encounter- SOAP NOTE Established Patient  This telephone encounter was conducted with the patient's (or proxy's) verbal consent via audio telecommunications: yes/no: Yes Patient was instructed to have this encounter in a suitably private space; and to only have persons present to whom they give permission to participate. In addition, patient identity was confirmed by use of name plus two identifiers (DOB and address).  I discussed the limitations, risks, security and privacy concerns of performing an evaluation and management service by telephone and the availability of in person appointments. I also discussed with the patient that there may be a patient responsible charge related to this service. The patient expressed understanding and agreed to proceed.  I spent a total of TIME; 0 MIN TO 60 MIN: 15 minutes talking with the patient or their proxy.  Chief Complaint  Patient presents with  . Generalized Body Aches    started 3-4 days ago  . Fever    101.0 degrees  . Abdominal Pain    Subjective   Rachel Luna is a 64 y.o. female established patient. Telephone visit today complaining of flulike symptoms that started 4 days ago.  Mostly with fever, body aches, headaches, and abdominal crampy abdominal pain.  Denies difficulty breathing, wheezing, chest pain, diarrhea, vomiting, rash, or any other significant symptoms.  Roommate not sick.  HPI   Patient Active Problem List   Diagnosis Date Noted  . Restless leg syndrome 02/15/2019  . Bipolar I disorder, most recent episode mixed ([place]) 09/14/2018  . GAD (generalized anxiety disorder) 09/14/2018  . 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 Type 2 diabetes mellitus with hyperglycemia, without long-term current use of insulin ([place]) 07/13/2018  . Seizure disorder ([place]) 10/19/2017  . 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 ANA positive 03/05/2015  . Elevated rheumatoid factor 03/05/2015  . Avitaminosis D  09/04/2014  . Chronic low back pain 02/18/2014  . Menopausal hot flushes 08/14/2012  . Arthritis of knee, degenerative 04/06/2011  . Benign essential HTN 07/13/2010  . Chronic pain 01/08/2010  . Affective disorder, major 11/05/2009  . Major depressive disorder 11/05/2009    Past Medical History:  Diagnosis Date  . Anxiety   . Arthritis   . Bipolar disorder ([place])   . chronic low back pain   . Chronic low back pain 02/18/2014  . Depression   . Diabetes mellitus without complication ([place])   . Diabetes mellitus, type II ([place])   . Fibroids   . GERD (gastroesophageal reflux disease)   . Gout   . Hyperlipidemia   . Hypertension   . Obesity     Current Outpatient Medications  Medication Sig Dispense Refill  . baclofen (LIORESAL) 10 MG tablet TAKE 1 TABLET BY MOUTH 3 TIMES DAILY 270 each 1  . carbamazepine (TEGRETOL) 200 MG tablet Take 0.5 tablets (100 mg total) by mouth daily. 45 tablet 1  . cetirizine (ZYRTEC) 10 MG tablet Take 10 mg by mouth daily.    . clonazePAM (KLONOPIN) 0.5 MG tablet Take 1 tablet (0.5 mg total) by mouth at bedtime. 30 tablet 3  . DULoxetine (CYMBALTA) 30 MG capsule Take 1 capsule (30 mg total) by mouth daily. To be combined with 60 mg 90 capsule 1  . DULoxetine (CYMBALTA) 60 MG capsule Take 1 capsule (60 mg total) by mouth daily. 90 capsule 1  . estradiol (ESTRACE) 0.5 MG tablet Take 1 tablet (0.5 mg total) by mouth daily. 30 tablet 11  . gabapentin (NEURONTIN) 800  MG tablet Take 1 tablet (800 mg total) by mouth 3 (three) times daily. 270 tablet 1  . glimepiride (AMARYL) 4 MG tablet Take 1 tablet (4 mg total) by mouth daily with breakfast. 90 tablet 1  . JANUVIA 50 MG tablet TAKE 1 TABLET BY MOUTH ONCE DAILY 90 tablet 1  . meloxicam (MOBIC) 15 MG tablet Take 15 mg daily by mouth.     . pramipexole (MIRAPEX) 0.75 MG tablet TAKE 1 TABLET BY MOUTH ONCE EVERY MORNING AND 2 TABLETS ONCE EVERY EVENING 270 tablet 2  . rosuvastatin (CRESTOR) 40 MG tablet Take 1  tablet (40 mg total) by mouth daily. 90 tablet 3  . ACCU-CHEK AVIVA PLUS test strip Use in the morning and after meals as needed. 100 each 5  . diclofenac Sodium (VOLTAREN) 1 % GEL Place onto the skin.    Marland Kitchen losartan-hydrochlorothiazide (HYZAAR) 100-25 MG tablet Take 1 tablet by mouth daily. 90 tablet 3   No current facility-administered medications for this visit.    Allergies  Allergen Reactions  . Wellbutrin [Bupropion] Other (See Comments)    Irritability and Agitation  . Belsomra [Suvorexant]     Vivid dreams  . Geodon [Ziprasidone Hcl] Other (See Comments)    Worsened Restless Leg Syndrome  . Lisinopril Cough  . Neupro [Rotigotine] Rash    Social History   Socioeconomic History  . Marital status: Divorced    Spouse name: Not on file  . Number of children: 0  . Years of education: 10  . Highest education level: 10th grade  Occupational History    Employer: UNEMPLOYED  Tobacco Use  . Smoking status: Passive Smoke Exposure - Never Smoker  . Smokeless tobacco: Never Used  . Tobacco comment: room mate smokes  Substance and Sexual Activity  . Alcohol use: No    Alcohol/week: 0.0 standard drinks  . Drug use: No  . Sexual activity: Not Currently  Other Topics Concern  . Not on file  Social History Narrative   Patient is divorced and lives with a roommate.   Patient is on disability.   Patient has a 10th grade education.   Patient is right-handed.   Patient drinks 6 sodas daily.         Social Determinants of Health   Financial Resource Strain:   . Difficulty of Paying Living Expenses: Not on file  Food Insecurity:   . Worried About Charity fundraiser in the Last Year: Not on file  . Ran Out of Food in the Last Year: Not on file  Transportation Needs:   . Lack of Transportation (Medical): Not on file  . Lack of Transportation (Non-Medical): Not on file  Physical Activity:   . Days of Exercise per Week: Not on file  . Minutes of Exercise per Session: Not on  file  Stress:   . Feeling of Stress : Not on file  Social Connections:   . Frequency of Communication with Friends and Family: Not on file  . Frequency of Social Gatherings with Friends and Family: Not on file  . Attends Religious Services: Not on file  . Active Member of Clubs or Organizations: Not on file  . Attends Archivist Meetings: Not on file  . Marital Status: Not on file  Intimate Partner Violence:   . Fear of Current or Ex-Partner: Not on file  . Emotionally Abused: Not on file  . Physically Abused: Not on file  . Sexually Abused: Not on file  Review of Systems  Constitutional: Positive for fever and malaise/fatigue. Negative for chills.  HENT: Negative.  Negative for congestion and sore throat.   Respiratory: Negative.  Negative for cough and shortness of breath.   Cardiovascular: Negative.  Negative for chest pain and palpitations.  Gastrointestinal: Positive for abdominal pain. Negative for blood in stool, diarrhea, melena, nausea and vomiting.  Genitourinary: Negative.  Negative for dysuria.  Musculoskeletal: Positive for myalgias.  Skin: Negative.  Negative for rash.  Neurological: Positive for headaches. Negative for dizziness.  All other systems reviewed and are negative.   Objective  Alert and oriented x3 no apparent respiratory distress. Vitals as reported by the patient: Today's Vitals   04/18/19 0857  Weight: 300 lb (136.1 kg)  Height: 5\' 5"  (1.651 m)    Sutter was seen today for generalized body aches, fever and abdominal pain.  Diagnoses and all orders for this visit:  Flu-like symptoms  Type 2 diabetes mellitus with hyperglycemia, without long-term current use of insulin (HCC) -     rosuvastatin (CRESTOR) 40 MG tablet; Take 1 tablet (40 mg total) by mouth daily. -     glimepiride (AMARYL) 4 MG tablet; Take 1 tablet (4 mg total) by mouth daily with breakfast.  Restless legs syndrome (RLS) -     gabapentin (NEURONTIN) 800 MG tablet;  Take 1 tablet (800 mg total) by mouth 3 (three) times daily.  Encounter for screening mammogram for malignant neoplasm of breast -     MM Digital Screening; Future  Suspected COVID-19 virus infection -     Novel Coronavirus, NAA ([facility]); Future   Clinically stable.  No red flag signs or symptoms.  Advised to get tested for Covid today. Advised to contact the office if no better or worse during the next several days. Office visit in 3 to 6 months.  I discussed the assessment and treatment plan with the patient. The patient was provided an opportunity to ask questions and all were answered. The patient agreed with the plan and demonstrated an understanding of the instructions.   The patient was advised to call back or seek an in-person evaluation if the symptoms worsen or if the condition fails to improve as anticipated.  I provided 15 minutes of non-face-to-face time during this encounter.  Horald Pollen, MD  Primary Care at [facility]

## 2019-04-20 ENCOUNTER — Other Ambulatory Visit: Payer: Self-pay | Admitting: Emergency Medicine

## 2019-04-20 DIAGNOSIS — E1165 Type 2 diabetes mellitus with hyperglycemia: Secondary | ICD-10-CM

## 2019-04-24 ENCOUNTER — Telehealth: Payer: Self-pay

## 2019-04-24 DIAGNOSIS — F3160 Bipolar disorder, current episode mixed, unspecified: Secondary | ICD-10-CM

## 2019-04-24 DIAGNOSIS — F411 Generalized anxiety disorder: Secondary | ICD-10-CM

## 2019-04-24 MED ORDER — DULOXETINE HCL 60 MG PO CPEP: 60.0000 mg | ORAL_CAPSULE | Freq: Every day | ORAL | 1 refills | Status: DC

## 2019-04-24 MED ORDER — DULOXETINE HCL 30 MG PO CPEP: 30.0000 mg | ORAL_CAPSULE | Freq: Every day | ORAL | 1 refills | Status: DC

## 2019-04-24 NOTE — Telephone Encounter (Signed)
I have sent Cymbalta to pharmacy. 

## 2019-04-24 NOTE — Telephone Encounter (Signed)
  received fax requesting a refill on the duloxetine 30mg     DULoxetine (CYMBALTA) 30 MG capsule Medication Date: 12/08/2018 Department: [facility] Ordering/Authorizing: Ursula Alert, MD  Order Providers  Prescribing Provider Encounter Provider  Ursula Alert, MD Ursula Alert, MD  Outpatient Medication Detail   Disp Refills Start End   DULoxetine (CYMBALTA) 30 MG capsule 90 capsule 1 12/08/2018    Sig - Route: Take 1 capsule (30 mg total) by mouth daily. To be combined with 60 mg - Oral   Sent to pharmacy as: DULoxetine (CYMBALTA) 30 MG capsule   E-Prescribing Status: Receipt confirmed by pharmacy (12/08/2018 12:49 PM EDT)   Associated Diagnoses  Bipolar I disorder, most recent episode mixed ([place])     GAD (generalized anxiety disorder)

## 2019-04-27 ENCOUNTER — Telehealth: Payer: Self-pay | Admitting: Emergency Medicine

## 2019-04-27 NOTE — Telephone Encounter (Signed)
Scheduled the patient for a telemed she is having problems with her knee

## 2019-04-27 NOTE — Telephone Encounter (Signed)
REFERRAL REQUEST Telephone Note  What type of referral do you need?ortho pedic   Have you been seen at our office for this problem? No  (Advise that they will likely need an appointment with their PCP before a referral can be done)  Is there a particular doctor or location that you prefer? Dr,kafrey on [address]   Patient notified that referrals can take up to a week or longer to process. If they haven't heard anything within a week they should call back and speak with the referral department.

## 2019-05-02 ENCOUNTER — Telehealth (INDEPENDENT_AMBULATORY_CARE_PROVIDER_SITE_OTHER): Payer: Medicare Other | Admitting: Emergency Medicine

## 2019-05-02 ENCOUNTER — Other Ambulatory Visit: Payer: Self-pay

## 2019-05-02 ENCOUNTER — Encounter: Payer: Self-pay | Admitting: Emergency Medicine

## 2019-05-02 VITALS — Ht 65.0 in

## 2019-05-02 DIAGNOSIS — M25561 Pain in right knee: Secondary | ICD-10-CM

## 2019-05-02 DIAGNOSIS — G8929 Other chronic pain: Secondary | ICD-10-CM

## 2019-05-02 NOTE — Progress Notes (Signed)
Telemedicine Encounter- SOAP NOTE Established Patient  This telephone encounter was conducted with the patient's (or proxy's) verbal consent via audio telecommunications: yes/no: Yes Patient was instructed to have this encounter in a suitably private space; and to only have persons present to whom they give permission to participate. In addition, patient identity was confirmed by use of name plus two identifiers (DOB and address).  I discussed the limitations, risks, security and privacy concerns of performing an evaluation and management service by telephone and the availability of in person appointments. I also discussed with the patient that there may be a patient responsible charge related to this service. The patient expressed understanding and agreed to proceed.  I spent a total of TIME; 0 MIN TO 60 MIN: 15 minutes talking with the patient or their proxy.  Chief Complaint  Patient presents with  . Referral    to [facility]- Dr French Ana - R knee surgery    Subjective   Rachel Luna is a 64 y.o. female established patient. Telephone visit today complaining of chronic right knee pain.  Requesting referral to orthopedist Dr. French Ana.  No other complaints or medical concerns.  HPI   Patient Active Problem List   Diagnosis Date Noted  . Restless leg syndrome 02/15/2019  . Bipolar I disorder, most recent episode mixed ([place]) 09/14/2018  . GAD (generalized anxiety disorder) 09/14/2018  . 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 Type 2 diabetes mellitus with hyperglycemia, without long-term current use of insulin ([place]) 07/13/2018  . Seizure disorder ([place]) 10/19/2017  . 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 ANA positive 03/05/2015  . Elevated rheumatoid factor 03/05/2015  . Avitaminosis D 09/04/2014  . Chronic low back pain 02/18/2014  . Menopausal hot flushes 08/14/2012  . Arthritis of knee, degenerative 04/06/2011  . Benign essential HTN 07/13/2010    . Chronic pain 01/08/2010  . Affective disorder, major 11/05/2009  . Major depressive disorder 11/05/2009    Past Medical History:  Diagnosis Date  . Anxiety   . Arthritis   . Bipolar disorder ([place])   . chronic low back pain   . Chronic low back pain 02/18/2014  . Depression   . Diabetes mellitus without complication ([place])   . Diabetes mellitus, type II ([place])   . Fibroids   . GERD (gastroesophageal reflux disease)   . Gout   . Hyperlipidemia   . Hypertension   . Obesity     Current Outpatient Medications  Medication Sig Dispense Refill  . ACCU-CHEK AVIVA PLUS test strip Use in the morning and after meals as needed. 100 each 5  . baclofen (LIORESAL) 10 MG tablet TAKE 1 TABLET BY MOUTH 3 TIMES DAILY 270 each 1  . carbamazepine (TEGRETOL) 200 MG tablet Take 0.5 tablets (100 mg total) by mouth daily. 45 tablet 1  . cetirizine (ZYRTEC) 10 MG tablet Take 10 mg by mouth daily.    . clonazePAM (KLONOPIN) 0.5 MG tablet Take 1 tablet (0.5 mg total) by mouth at bedtime. 30 tablet 3  . DULoxetine (CYMBALTA) 30 MG capsule Take 1 capsule (30 mg total) by mouth daily. To be combined with 60 mg 90 capsule 1  . DULoxetine (CYMBALTA) 60 MG capsule Take 1 capsule (60 mg total) by mouth daily. 90 capsule 1  . estradiol (ESTRACE) 0.5 MG tablet Take 1 tablet (0.5 mg total) by mouth daily. 30 tablet 11  . gabapentin (NEURONTIN) 800 MG tablet Take 1 tablet (800 mg total)  by mouth 3 (three) times daily. 270 tablet 1  . glimepiride (AMARYL) 4 MG tablet Take 1 tablet (4 mg total) by mouth daily with breakfast. 90 tablet 1  . JANUVIA 50 MG tablet TAKE 1 TABLET BY MOUTH ONCE DAILY 90 tablet 1  . losartan-hydrochlorothiazide (HYZAAR) 100-25 MG tablet Take 1 tablet by mouth daily. 90 tablet 3  . meloxicam (MOBIC) 15 MG tablet Take 15 mg daily by mouth.     . pramipexole (MIRAPEX) 0.75 MG tablet TAKE 1 TABLET BY MOUTH ONCE EVERY MORNING AND 2 TABLETS ONCE EVERY EVENING 270 tablet 2  . rosuvastatin (CRESTOR)  40 MG tablet Take 1 tablet (40 mg total) by mouth daily. 90 tablet 3   No current facility-administered medications for this visit.    Allergies  Allergen Reactions  . Wellbutrin [Bupropion] Other (See Comments)    Irritability and Agitation  . Belsomra [Suvorexant]     Vivid dreams  . Geodon [Ziprasidone Hcl] Other (See Comments)    Worsened Restless Leg Syndrome  . Lisinopril Cough  . Neupro [Rotigotine] Rash    Social History   Socioeconomic History  . Marital status: Divorced    Spouse name: Not on file  . Number of children: 0  . Years of education: 10  . Highest education level: 10th grade  Occupational History    Employer: UNEMPLOYED  Tobacco Use  . Smoking status: Passive Smoke Exposure - Never Smoker  . Smokeless tobacco: Never Used  . Tobacco comment: room mate smokes  Substance and Sexual Activity  . Alcohol use: No    Alcohol/week: 0.0 standard drinks  . Drug use: No  . Sexual activity: Not Currently  Other Topics Concern  . Not on file  Social History Narrative   Patient is divorced and lives with a roommate.   Patient is on disability.   Patient has a 10th grade education.   Patient is right-handed.   Patient drinks 6 sodas daily.         Social Determinants of Health   Financial Resource Strain:   . Difficulty of Paying Living Expenses: Not on file  Food Insecurity:   . Worried About Charity fundraiser in the Last Year: Not on file  . Ran Out of Food in the Last Year: Not on file  Transportation Needs:   . Lack of Transportation (Medical): Not on file  . Lack of Transportation (Non-Medical): Not on file  Physical Activity:   . Days of Exercise per Week: Not on file  . Minutes of Exercise per Session: Not on file  Stress:   . Feeling of Stress : Not on file  Social Connections:   . Frequency of Communication with Friends and Family: Not on file  . Frequency of Social Gatherings with Friends and Family: Not on file  . Attends Religious  Services: Not on file  . Active Member of Clubs or Organizations: Not on file  . Attends Archivist Meetings: Not on file  . Marital Status: Not on file  Intimate Partner Violence:   . Fear of Current or Ex-Partner: Not on file  . Emotionally Abused: Not on file  . Physically Abused: Not on file  . Sexually Abused: Not on file    Review of Systems  Constitutional: Negative.  Negative for chills and fever.  HENT: Negative.  Negative for congestion and sore throat.   Respiratory: Negative.  Negative for cough and shortness of breath.   Cardiovascular: Negative.  Negative  for chest pain and palpitations.  Gastrointestinal: Negative.  Negative for abdominal pain, diarrhea, nausea and vomiting.  Genitourinary: Negative.  Negative for dysuria.  Musculoskeletal: Positive for joint pain (Right knee chronic pain). Negative for myalgias and neck pain.  Skin: Negative.  Negative for rash.  Neurological: Negative for dizziness and headaches.  All other systems reviewed and are negative.   Objective  Alert and oriented x3 in no apparent respiratory distress. Vitals as reported by the patient: Today's Vitals   05/02/19 1024  Height: 5\' 5"  (1.651 m)    Lorilei was seen today for referral.  Diagnoses and all orders for this visit:  Chronic pain of right knee -     Ambulatory referral to Orthopedic Surgery     I discussed the assessment and treatment plan with the patient. The patient was provided an opportunity to ask questions and all were answered. The patient agreed with the plan and demonstrated an understanding of the instructions.   The patient was advised to call back or seek an in-person evaluation if the symptoms worsen or if the condition fails to improve as anticipated.  I provided 15 minutes of non-face-to-face time during this encounter.  Horald Pollen, MD  Primary Care at [facility]

## 2019-05-10 ENCOUNTER — Encounter: Payer: Self-pay | Admitting: Psychiatry

## 2019-05-10 ENCOUNTER — Other Ambulatory Visit: Payer: Self-pay

## 2019-05-10 ENCOUNTER — Ambulatory Visit (INDEPENDENT_AMBULATORY_CARE_PROVIDER_SITE_OTHER): Payer: Medicare Other | Admitting: Psychiatry

## 2019-05-10 DIAGNOSIS — F411 Generalized anxiety disorder: Secondary | ICD-10-CM | POA: Diagnosis not present

## 2019-05-10 DIAGNOSIS — F5105 Insomnia due to other mental disorder: Secondary | ICD-10-CM

## 2019-05-10 DIAGNOSIS — F3160 Bipolar disorder, current episode mixed, unspecified: Secondary | ICD-10-CM | POA: Diagnosis not present

## 2019-05-10 NOTE — Progress Notes (Signed)
Provider Location : [facility] Patient Location : Home  Virtual Visit via Video Note  I connected with Rachel Luna on 05/10/19 at 10:20 AM EST by a video enabled telemedicine application and verified that I am speaking with the correct person using two identifiers.   I discussed the limitations of evaluation and management by telemedicine and the availability of in person appointments. The patient expressed understanding and agreed to proceed.    I discussed the assessment and treatment plan with the patient. The patient was provided an opportunity to ask questions and all were answered. The patient agreed with the plan and demonstrated an understanding of the instructions.   The patient was advised to call back or seek an in-person evaluation if the symptoms worsen or if the condition fails to improve as anticipated.   [facility] MD OP Progress Note  05/10/2019 12:43 PM DREYA KROUSE  MRN:  [id number]  Chief Complaint:  Chief Complaint    Follow-up     HPI: Rachel Luna is a 64 year old Caucasian female, divorced, disabled, lives in [place], has a history of bipolar disorder, GAD, insomnia, seizure disorder, morbid obesity, chronic pain, neuropathy was evaluated by telemedicine today.  Patient today reports she is currently struggling with psychosocial stressors of the pandemic, her dad's dementia.  She however reports she has been coping okay.  She continues to follow-up with her therapist [name] and reports therapy sessions is beneficial.  She reports she continues to struggle with sleep.  She does not have a good bedtime or a wake up time.  She reports she is retired and hence has not been able to follow a routine.  She also reports she has to wake up several times at night to urinate.  All this affects her sleep.  She however is not interested in a sleep medication.  Patient denies any suicidality, homicidality or perceptual disturbances.  Patient denies any other concerns today. Visit  Diagnosis:    ICD-10-CM   1. Bipolar I disorder, most recent episode mixed ([place])  F31.60   2. GAD (generalized anxiety disorder)  F41.1   3. Insomnia due to mental disorder  F51.05     Past Psychiatric History: I have reviewed past psychiatric history from my progress note on 10/19/2017.  Past trials of Abilify, Klonopin, Elavil, Cymbalta, Tegretol, Effexor, trazodone, Belsomra.  Past Medical History:  Past Medical History:  Diagnosis Date  . Anxiety   . Arthritis   . Bipolar disorder ([place])   . chronic low back pain   . Chronic low back pain 02/18/2014  . Depression   . Diabetes mellitus without complication ([place])   . Diabetes mellitus, type II ([place])   . Fibroids   . GERD (gastroesophageal reflux disease)   . Gout   . Hyperlipidemia   . Hypertension   . Obesity     Past Surgical History:  Procedure Laterality Date  . ABDOMINAL HYSTERECTOMY    . CHOLECYSTECTOMY    . FOOT SURGERY Left   . KNEE ARTHROPLASTY    . OVARIAN CYST REMOVAL    . SPINE SURGERY      Family Psychiatric History: I have reviewed family psychiatric history from my progress note on 02/15/2019.  Family History:  Family History  Problem Relation Age of Onset  . Diabetes Mother   . Cancer Mother        breast  . Heart disease Mother   . Mental illness Mother        bipolar disorder  .  Stroke Mother   . Breast cancer Mother   . Hypertension Mother   . High Cholesterol Mother   . Diabetes Father   . Hypertension Father   . Diabetes Brother   . Bipolar disorder Brother   . Diabetes Brother   . Breast cancer Maternal Aunt   . Breast cancer Maternal Grandmother     Social History: Reviewed social history from my progress note on 02/15/2019. Social History   Socioeconomic History  . Marital status: Divorced    Spouse name: Not on file  . Number of children: 0  . Years of education: 10  . Highest education level: 10th grade  Occupational History    Employer: UNEMPLOYED  Tobacco Use  .  Smoking status: Passive Smoke Exposure - Never Smoker  . Smokeless tobacco: Never Used  . Tobacco comment: room mate smokes  Substance and Sexual Activity  . Alcohol use: No    Alcohol/week: 0.0 standard drinks  . Drug use: No  . Sexual activity: Not Currently  Other Topics Concern  . Not on file  Social History Narrative   Patient is divorced and lives with a roommate.   Patient is on disability.   Patient has a 10th grade education.   Patient is right-handed.   Patient drinks 6 sodas daily.         Social Determinants of Health   Financial Resource Strain:   . Difficulty of Paying Living Expenses: Not on file  Food Insecurity:   . Worried About Charity fundraiser in the Last Year: Not on file  . Ran Out of Food in the Last Year: Not on file  Transportation Needs:   . Lack of Transportation (Medical): Not on file  . Lack of Transportation (Non-Medical): Not on file  Physical Activity:   . Days of Exercise per Week: Not on file  . Minutes of Exercise per Session: Not on file  Stress:   . Feeling of Stress : Not on file  Social Connections:   . Frequency of Communication with Friends and Family: Not on file  . Frequency of Social Gatherings with Friends and Family: Not on file  . Attends Religious Services: Not on file  . Active Member of Clubs or Organizations: Not on file  . Attends Archivist Meetings: Not on file  . Marital Status: Not on file    Allergies:  Allergies  Allergen Reactions  . Wellbutrin [Bupropion] Other (See Comments)    Irritability and Agitation  . Belsomra [Suvorexant]     Vivid dreams  . Geodon [Ziprasidone Hcl] Other (See Comments)    Worsened Restless Leg Syndrome  . Lisinopril Cough  . Neupro [Rotigotine] Rash    Metabolic Disorder Labs: Lab Results  Component Value Date   HGBA1C 7.9 (A) 10/16/2018   No results found for: PROLACTIN Lab Results  Component Value Date   CHOL 172 10/16/2018   TRIG 164 (H) 10/16/2018    HDL 64 10/16/2018   CHOLHDL 2.7 10/16/2018   VLDL 41 (H) 03/25/2014   LDLCALC 75 10/16/2018   LDLCALC 68 07/13/2018   Lab Results  Component Value Date   TSH 2.030 07/13/2018    Therapeutic Level Labs: No results found for: LITHIUM No results found for: VALPROATE No components found for:  CBMZ  Current Medications: Current Outpatient Medications  Medication Sig Dispense Refill  . ACCU-CHEK AVIVA PLUS test strip Use in the morning and after meals as needed. 100 each 5  . baclofen (  LIORESAL) 10 MG tablet TAKE 1 TABLET BY MOUTH 3 TIMES DAILY 270 each 1  . carbamazepine (TEGRETOL) 200 MG tablet Take 0.5 tablets (100 mg total) by mouth daily. 45 tablet 1  . cetirizine (ZYRTEC) 10 MG tablet Take 10 mg by mouth daily.    . clonazePAM (KLONOPIN) 0.5 MG tablet Take 1 tablet (0.5 mg total) by mouth at bedtime. 30 tablet 3  . DULoxetine (CYMBALTA) 30 MG capsule Take 1 capsule (30 mg total) by mouth daily. To be combined with 60 mg 90 capsule 1  . DULoxetine (CYMBALTA) 60 MG capsule Take 1 capsule (60 mg total) by mouth daily. 90 capsule 1  . estradiol (ESTRACE) 0.5 MG tablet Take 1 tablet (0.5 mg total) by mouth daily. 30 tablet 11  . gabapentin (NEURONTIN) 800 MG tablet Take 1 tablet (800 mg total) by mouth 3 (three) times daily. 270 tablet 1  . glimepiride (AMARYL) 4 MG tablet Take 1 tablet (4 mg total) by mouth daily with breakfast. 90 tablet 1  . JANUVIA 50 MG tablet TAKE 1 TABLET BY MOUTH ONCE DAILY 90 tablet 1  . losartan-hydrochlorothiazide (HYZAAR) 100-25 MG tablet Take 1 tablet by mouth daily. 90 tablet 3  . meloxicam (MOBIC) 15 MG tablet Take 15 mg daily by mouth.     . pramipexole (MIRAPEX) 0.75 MG tablet TAKE 1 TABLET BY MOUTH ONCE EVERY MORNING AND 2 TABLETS ONCE EVERY EVENING 270 tablet 2  . rosuvastatin (CRESTOR) 40 MG tablet Take 1 tablet (40 mg total) by mouth daily. 90 tablet 3   No current facility-administered medications for this visit.     Musculoskeletal: Strength  & Muscle Tone: UTA Gait & Station: normal Patient leans: N/A  Psychiatric Specialty Exam: Review of Systems  Genitourinary: Positive for frequency.  Psychiatric/Behavioral: Positive for sleep disturbance. The patient is nervous/anxious.   All other systems reviewed and are negative.   There were no vitals taken for this visit.There is no height or weight on file to calculate BMI.  General Appearance: Casual  Eye Contact:  Fair  Speech:  Clear and Coherent  Volume:  Normal  Mood:  Anxious  Affect:  Congruent  Thought Process:  Goal Directed and Descriptions of Associations: Intact  Orientation:  Full (Time, Place, and Person)  Thought Content: Logical   Suicidal Thoughts:  No  Homicidal Thoughts:  No  Memory:  Immediate;   Fair Recent;   Fair Remote;   Fair  Judgement:  Fair  Insight:  Fair  Psychomotor Activity:  Normal  Concentration:  Concentration: Fair and Attention Span: Fair  Recall:  [employer] of Knowledge: Fair  Language: Fair  Akathisia:  No  Handed:  Right  AIMS (if indicated): UTA  Assets:  Communication Skills Desire for Improvement Housing Social Support  ADL's:  Intact  Cognition: WNL  Sleep:  Poor   Screenings: PHQ2-9     Telemedicine from 05/02/2019 in [facility] at [facility] from 04/18/2019 in [facility] from 10/16/2018 in [facility] from 07/13/2018 in [facility] at [facility] Total Score  6  0  0  3  PHQ-9 Total Score  22  --  --  16       Assessment and Plan: Rachel Luna is a 64 year old Caucasian female, divorced, disabled, lives in [place], has a history of bipolar disorder, anxiety disorder, insomnia, seizure disorder, neuropathy, chronic pain, morbid obesity was evaluated by telemedicine today.  Patient continues to  struggle with sleep issues however is not interested in medications.  Plan as noted below.  Plan Bipolar disorder-in partial remission Cymbalta 90 mg p.o.  daily Tegretol 100 mg p.o. daily-prescribed by neurology for seizures-currently being tapered down. Patient declines any further medication changes and wants to continue psychotherapy sessions with [name].  Anxiety disorder-improving Continue CBT Continue Cymbalta as prescribed  Insomnia-unstable Discussed sleep hygiene techniques-she has not been following a routine. She is not interested in medications. She has to urinate several times at night and advised her to talk to her primary care provider.  Patient was referred for GeneSight testing in the past-she declined.  Follow-up in clinic in 3 to 4 months or sooner if needed.  I have spent atleast 20 minutes non face to face with patient today. More than 50 % of the time was spent for ordering medications and test ,psychoeducation and supportive psychotherapy and care coordination,as well as documenting clinical information in electronic health record. This note was generated in part or whole with voice recognition software. Voice recognition is usually quite accurate but there are transcription errors that can and very often do occur. I apologize for any typographical errors that were not detected and corrected.       Ursula Alert, MD 05/10/2019, 12:43 PM

## 2019-05-17 ENCOUNTER — Other Ambulatory Visit: Payer: Self-pay | Admitting: Orthopedic Surgery

## 2019-05-17 DIAGNOSIS — M542 Cervicalgia: Secondary | ICD-10-CM

## 2019-05-17 DIAGNOSIS — M5412 Radiculopathy, cervical region: Secondary | ICD-10-CM

## 2019-05-22 ENCOUNTER — Other Ambulatory Visit: Payer: Self-pay

## 2019-05-22 ENCOUNTER — Other Ambulatory Visit: Payer: Self-pay | Admitting: Podiatry

## 2019-05-22 ENCOUNTER — Ambulatory Visit (INDEPENDENT_AMBULATORY_CARE_PROVIDER_SITE_OTHER): Payer: Medicare Other

## 2019-05-22 ENCOUNTER — Ambulatory Visit (INDEPENDENT_AMBULATORY_CARE_PROVIDER_SITE_OTHER): Payer: Medicare Other | Admitting: Podiatry

## 2019-05-22 DIAGNOSIS — M19072 Primary osteoarthritis, left ankle and foot: Secondary | ICD-10-CM

## 2019-05-22 DIAGNOSIS — L03115 Cellulitis of right lower limb: Secondary | ICD-10-CM

## 2019-05-22 DIAGNOSIS — M779 Enthesopathy, unspecified: Secondary | ICD-10-CM | POA: Diagnosis not present

## 2019-05-22 DIAGNOSIS — M79672 Pain in left foot: Secondary | ICD-10-CM

## 2019-05-22 DIAGNOSIS — R21 Rash and other nonspecific skin eruption: Secondary | ICD-10-CM | POA: Diagnosis not present

## 2019-05-22 MED ORDER — TRIAMCINOLONE ACETONIDE 0.025 % EX OINT: 1.0000 "application " | TOPICAL_OINTMENT | Freq: Two times a day (BID) | CUTANEOUS | 0 refills | Status: DC

## 2019-05-22 MED ORDER — MELOXICAM 15 MG PO TABS: 15.0000 mg | ORAL_TABLET | Freq: Every day | ORAL | 0 refills | Status: DC

## 2019-05-22 MED ORDER — DOXYCYCLINE HYCLATE 100 MG PO TABS: 100.0000 mg | ORAL_TABLET | Freq: Two times a day (BID) | ORAL | 0 refills | Status: DC

## 2019-05-27 NOTE — Progress Notes (Signed)
Subjective: 64 year old female presents the office today for concerns of left generalized foot pain.  She gets pain to the top of her foot she is also describing pain to the lateral aspect which is been intermittent for the last 6 months.  Denies any recent injury or trauma.  No increase in swelling or redness to the left foot.  She does have a rash on the anterior aspect of right leg which is been itching and she states is been getting somewhat worse.  This is been ongoing for last 1 week.  No open sores.  No drainage. Denies any systemic complaints such as fevers, chills, nausea, vomiting. No acute changes since last appointment, and no other complaints at this time.   Objective: AAO x3, NAD DP/PT pulses palpable bilaterally, CRT less than 3 seconds On the left foot there is tenderness on the dorsal aspect the left midfoot on the course the Lisfranc joint.  There is also discomfort along the course the peroneal tendons.  The peroneal tendons appear to be intact.  There is no other areas of pinpoint tenderness identified.  MMT 5/5. RIGHT anterior leg is not erythematous, warm rash present with excoriations present.  There is no drainage identified no ascending cellulitis there is no fluctuation crepitation. No edema, erythema, increase in warmth to bilateral lower extremities.  No open lesions or pre-ulcerative lesions.  No pain with calf compression, swelling, warmth, erythema  Assessment: Left foot osteoarthritis, peroneal tendinitis; right leg rash with possible cellulitis  Plan: -All treatment options discussed with the patient including all alternatives, risks, complications.  -X-rays obtained reviewed.  No evidence of acute fracture.  Significant arthritic changes are present. -Given the rash on the right side has some warmth prescribed doxycycline.  Also triamcinolone cream. -Burn hold off on injection the left foot today because of the right side.  Scribed meloxicam and dispensed Tri-Lock  ankle brace.  Discussed wearing supportive shoes she does not typically wear supportive shoes. -Patient encouraged to call the office with any questions, concerns, change in symptoms.   Trula Slade DPM

## 2019-06-05 ENCOUNTER — Ambulatory Visit: Payer: Medicare Other

## 2019-06-09 ENCOUNTER — Ambulatory Visit
Admission: RE | Admit: 2019-06-09 | Discharge: 2019-06-09 | Disposition: A | Discharge status: Home / Self Care | Payer: Medicare Other | Source: Ambulatory Visit | Attending: Orthopedic Surgery | Admitting: Orthopedic Surgery

## 2019-06-09 DIAGNOSIS — M5412 Radiculopathy, cervical region: Secondary | ICD-10-CM

## 2019-06-09 DIAGNOSIS — M542 Cervicalgia: Secondary | ICD-10-CM

## 2019-06-14 ENCOUNTER — Ambulatory Visit: Payer: Medicare Other | Admitting: Podiatry

## 2019-06-21 ENCOUNTER — Other Ambulatory Visit: Payer: Self-pay | Admitting: Neurology

## 2019-06-21 ENCOUNTER — Other Ambulatory Visit: Payer: Self-pay | Admitting: Emergency Medicine

## 2019-06-21 DIAGNOSIS — I10 Essential (primary) hypertension: Secondary | ICD-10-CM

## 2019-06-21 MED ORDER — CLONAZEPAM 0.5 MG PO TABS: 0.5000 mg | ORAL_TABLET | Freq: Every day | ORAL | 3 refills | Status: DC

## 2019-06-21 NOTE — Telephone Encounter (Signed)
1) Medication(s) Requested (by name):  clonazePAM (KLONOPIN) 0.5 MG tablet   2) Pharmacy of Choice: [facility], [address] [place] [address]     Pt has been out since [employer]

## 2019-06-28 ENCOUNTER — Other Ambulatory Visit: Payer: Self-pay | Admitting: Physical Medicine and Rehabilitation

## 2019-06-28 DIAGNOSIS — M25511 Pain in right shoulder: Secondary | ICD-10-CM

## 2019-07-10 ENCOUNTER — Ambulatory Visit: Payer: Medicare Other | Admitting: Podiatry

## 2019-07-13 ENCOUNTER — Other Ambulatory Visit: Payer: Self-pay | Admitting: Emergency Medicine

## 2019-07-13 DIAGNOSIS — I10 Essential (primary) hypertension: Secondary | ICD-10-CM

## 2019-07-16 ENCOUNTER — Ambulatory Visit: Payer: Medicare Other | Admitting: Emergency Medicine

## 2019-07-16 ENCOUNTER — Other Ambulatory Visit: Payer: Self-pay

## 2019-07-24 ENCOUNTER — Other Ambulatory Visit: Payer: Self-pay

## 2019-07-24 ENCOUNTER — Ambulatory Visit
Admission: RE | Admit: 2019-07-24 | Discharge: 2019-07-24 | Disposition: A | Discharge status: Home / Self Care | Payer: Medicare Other | Source: Ambulatory Visit | Attending: Physical Medicine and Rehabilitation | Admitting: Physical Medicine and Rehabilitation

## 2019-07-24 DIAGNOSIS — M25511 Pain in right shoulder: Secondary | ICD-10-CM

## 2019-07-26 ENCOUNTER — Ambulatory Visit: Payer: Medicare Other | Admitting: Podiatry

## 2019-07-31 ENCOUNTER — Other Ambulatory Visit: Payer: Self-pay | Admitting: Physical Medicine and Rehabilitation

## 2019-07-31 DIAGNOSIS — M4802 Spinal stenosis, cervical region: Secondary | ICD-10-CM

## 2019-08-03 ENCOUNTER — Other Ambulatory Visit: Payer: Self-pay | Admitting: Emergency Medicine

## 2019-08-03 DIAGNOSIS — E1165 Type 2 diabetes mellitus with hyperglycemia: Secondary | ICD-10-CM

## 2019-08-03 NOTE — Telephone Encounter (Signed)
Requested Prescriptions  Pending Prescriptions Disp Refills  . JANUVIA 50 MG tablet [Pharmacy Med Name: JANUVIA 50 MG TAB] 90 tablet 1    Sig: TAKE 1 TABLET BY MOUTH ONCE DAILY     Endocrinology:  Diabetes - DPP-4 Inhibitors Failed - 08/03/2019 11:13 AM      Failed - HBA1C is between 0 and 7.9 and within 180 days    Hemoglobin A1C  Date Value Ref Range Status  10/16/2018 7.9 (A) 4.0 - 5.6 % Final   Hgb A1c MFr Bld  Date Value Ref Range Status  07/11/2014 6.6 (H) 4.8 - 5.6 % Final    Comment:             Pre-diabetes: 5.7 - 6.4          Diabetes: >6.4          Glycemic control for adults with diabetes: <7.0          Passed - Cr in normal range and within 360 days    Creatinine, Ser  Date Value Ref Range Status  10/16/2018 0.78 0.57 - 1.00 mg/dL Final   Creatinine,U  Date Value Ref Range Status  03/12/2008 107.5 mg/dL Final    Comment:    See lab report for associated comment(s)         Passed - Valid encounter within last 6 months    Recent Outpatient Visits          3 months ago Chronic pain of right knee   Primary Care at [facility], Ines Bloomer, MD   3 months ago Flu-like symptoms   Primary [facility] at [facility], [place], MD   9 months ago Type 2 diabetes mellitus with hyperglycemia, without long-term current use of insulin [facility])   Primary Care at [facility], [place], MD   1 year ago Type 2 diabetes mellitus with hyperglycemia, without long-term current use of insulin [facility])   Primary Care at [facility], Ines Bloomer, MD      Future Appointments            In 5 days [place], Ines Bloomer, MD Primary Care at [place], [facility]

## 2019-08-08 ENCOUNTER — Encounter: Payer: Self-pay | Admitting: Emergency Medicine

## 2019-08-08 ENCOUNTER — Ambulatory Visit (INDEPENDENT_AMBULATORY_CARE_PROVIDER_SITE_OTHER): Payer: Medicare Other | Admitting: Emergency Medicine

## 2019-08-08 ENCOUNTER — Other Ambulatory Visit: Payer: Self-pay

## 2019-08-08 VITALS — BP 130/80 | HR 91 | Temp 97.0°F | Ht 64.0 in | Wt 282.4 lb

## 2019-08-08 DIAGNOSIS — E1159 Type 2 diabetes mellitus with other circulatory complications: Secondary | ICD-10-CM

## 2019-08-08 DIAGNOSIS — E1169 Type 2 diabetes mellitus with other specified complication: Secondary | ICD-10-CM

## 2019-08-08 DIAGNOSIS — E1165 Type 2 diabetes mellitus with hyperglycemia: Secondary | ICD-10-CM

## 2019-08-08 DIAGNOSIS — I10 Essential (primary) hypertension: Secondary | ICD-10-CM

## 2019-08-08 DIAGNOSIS — F3160 Bipolar disorder, current episode mixed, unspecified: Secondary | ICD-10-CM

## 2019-08-08 DIAGNOSIS — E785 Hyperlipidemia, unspecified: Secondary | ICD-10-CM

## 2019-08-08 DIAGNOSIS — I152 Hypertension secondary to endocrine disorders: Secondary | ICD-10-CM

## 2019-08-08 DIAGNOSIS — F411 Generalized anxiety disorder: Secondary | ICD-10-CM

## 2019-08-08 DIAGNOSIS — G894 Chronic pain syndrome: Secondary | ICD-10-CM

## 2019-08-08 LAB — CMP14+EGFR
ALT: 27 IU/L (ref 0–32)
AST: 26 IU/L (ref 0–40)
Albumin/Globulin Ratio: 1.8 (ref 1.2–2.2)
Albumin: 4.4 g/dL (ref 3.8–4.8)
Alkaline Phosphatase: 132 IU/L — ABNORMAL HIGH (ref 48–121)
BUN/Creatinine Ratio: 24 (ref 12–28)
BUN: 15 mg/dL (ref 8–27)
Bilirubin Total: <0.2 mg/dL (ref 0.0–1.2)
CO2: 27 mmol/L (ref 20–29)
Calcium: 9.7 mg/dL (ref 8.7–10.3)
Chloride: 94 mmol/L — ABNORMAL LOW (ref 96–106)
Creatinine, Ser: 0.63 mg/dL (ref 0.57–1.00)
GFR calc Af Amer: 112 mL/min/{1.73_m2} (ref >59)
GFR calc non Af Amer: 97 mL/min/{1.73_m2} (ref >59)
Globulin, Total: 2.4 g/dL (ref 1.5–4.5)
Glucose: 223 mg/dL — ABNORMAL HIGH (ref 65–99)
Potassium: 3.4 mmol/L — ABNORMAL LOW (ref 3.5–5.2)
Sodium: 137 mmol/L (ref 134–144)
Total Protein: 6.8 g/dL (ref 6.0–8.5)

## 2019-08-08 LAB — GLUCOSE, POCT (MANUAL RESULT ENTRY): POC Glucose: 221 mg/dl — AB (ref 70–99)

## 2019-08-08 LAB — POCT GLYCOSYLATED HEMOGLOBIN (HGB A1C): Hemoglobin A1C: 8.6 % — AB (ref 4.0–5.6)

## 2019-08-08 LAB — LIPID PANEL
Chol/HDL Ratio: 2.8 ratio (ref 0.0–4.4)
Cholesterol, Total: 185 mg/dL (ref 100–199)
HDL: 65 mg/dL (ref >39)
LDL Chol Calc (NIH): 89 mg/dL (ref 0–99)
Triglycerides: 186 mg/dL — ABNORMAL HIGH (ref 0–149)
VLDL Cholesterol Cal: 31 mg/dL (ref 5–40)

## 2019-08-08 MED ORDER — SITAGLIPTIN PHOSPHATE 100 MG PO TABS: 100.0000 mg | ORAL_TABLET | Freq: Every day | ORAL | 1 refills | Status: DC

## 2019-08-08 NOTE — Patient Instructions (Addendum)
   If you have lab work done today you will be contacted with your lab results within the next 2 weeks.  If you have not heard from us then please contact us. The fastest way to get your results is to register for My Chart.   IF you received an x-ray today, you will receive an invoice from [facility]. Please contact [facility] at [phone] with questions or concerns regarding your invoice.   IF you received labwork today, you will receive an invoice from [facility]. Please contact [facility] at [phone] with questions or concerns regarding your invoice.   Our billing staff will not be able to assist you with questions regarding bills from these companies.  You will be contacted with the lab results as soon as they are available. The fastest way to get your results is to activate your My Chart account. Instructions are located on the last page of this paperwork. If you have not heard from us regarding the results in 2 weeks, please contact this office.      Diabetes Mellitus and Nutrition, Adult When you have diabetes (diabetes mellitus), it is very important to have healthy eating habits because your blood sugar (glucose) levels are greatly affected by what you eat and drink. Eating healthy foods in the appropriate amounts, at about the same times every day, can help you:  Control your blood glucose.  Lower your risk of heart disease.  Improve your blood pressure.  Reach or maintain a healthy weight. Every person with diabetes is different, and each person has different needs for a meal plan. Your health care provider may recommend that you work with a diet and nutrition specialist (dietitian) to make a meal plan that is best for you. Your meal plan may vary depending on factors such as:  The calories you need.  The medicines you take.  Your weight.  Your blood glucose, blood pressure, and cholesterol levels.  Your activity level.  Other health  conditions you have, such as heart or kidney disease. How do carbohydrates affect me? Carbohydrates, also called carbs, affect your blood glucose level more than any other type of food. Eating carbs naturally raises the amount of glucose in your blood. Carb counting is a method for keeping track of how many carbs you eat. Counting carbs is important to keep your blood glucose at a healthy level, especially if you use insulin or take certain oral diabetes medicines. It is important to know how many carbs you can safely have in each meal. This is different for every person. Your dietitian can help you calculate how many carbs you should have at each meal and for each snack. Foods that contain carbs include:  Bread, cereal, rice, pasta, and crackers.  Potatoes and corn.  Peas, beans, and lentils.  Milk and yogurt.  Fruit and juice.  Desserts, such as cakes, cookies, ice cream, and candy. How does alcohol affect me? Alcohol can cause a sudden decrease in blood glucose (hypoglycemia), especially if you use insulin or take certain oral diabetes medicines. Hypoglycemia can be a life-threatening condition. Symptoms of hypoglycemia (sleepiness, dizziness, and confusion) are similar to symptoms of having too much alcohol. If your health care provider says that alcohol is safe for you, follow these guidelines:  Limit alcohol intake to no more than 1 drink per day for nonpregnant women and 2 drinks per day for men. One drink equals 12 oz of beer, 5 oz of wine, or 1 oz of hard   liquor.  Do not drink on an empty stomach.  Keep yourself hydrated with water, diet soda, or unsweetened iced tea.  Keep in mind that regular soda, juice, and other mixers may contain a lot of sugar and must be counted as carbs. What are tips for following this plan?  Reading food labels  Start by checking the serving size on the "Nutrition Facts" label of packaged foods and drinks. The amount of calories, carbs, fats, and  other nutrients listed on the label is based on one serving of the item. Many items contain more than one serving per package.  Check the total grams (g) of carbs in one serving. You can calculate the number of servings of carbs in one serving by dividing the total carbs by 15. For example, if a food has 30 g of total carbs, it would be equal to 2 servings of carbs.  Check the number of grams (g) of saturated and trans fats in one serving. Choose foods that have low or no amount of these fats.  Check the number of milligrams (mg) of salt (sodium) in one serving. Most people should limit total sodium intake to less than 2,300 mg per day.  Always check the nutrition information of foods labeled as "low-fat" or "nonfat". These foods may be higher in added sugar or refined carbs and should be avoided.  Talk to your dietitian to identify your daily goals for nutrients listed on the label. Shopping  Avoid buying canned, premade, or processed foods. These foods tend to be high in fat, sodium, and added sugar.  Shop around the outside edge of the grocery store. This includes fresh fruits and vegetables, bulk grains, fresh meats, and fresh dairy. Cooking  Use low-heat cooking methods, such as baking, instead of high-heat cooking methods like deep frying.  Cook using healthy oils, such as olive, canola, or sunflower oil.  Avoid cooking with butter, cream, or high-fat meats. Meal planning  Eat meals and snacks regularly, preferably at the same times every day. Avoid going long periods of time without eating.  Eat foods high in fiber, such as fresh fruits, vegetables, beans, and whole grains. Talk to your dietitian about how many servings of carbs you can eat at each meal.  Eat 4-6 ounces (oz) of lean protein each day, such as lean meat, chicken, fish, eggs, or tofu. One oz of lean protein is equal to: ? 1 oz of meat, chicken, or fish. ? 1 egg. ?  cup of tofu.  Eat some foods each day that  contain healthy fats, such as avocado, nuts, seeds, and fish. Lifestyle  Check your blood glucose regularly.  Exercise regularly as told by your health care provider. This may include: ? 150 minutes of moderate-intensity or vigorous-intensity exercise each week. This could be brisk walking, biking, or water aerobics. ? Stretching and doing strength exercises, such as yoga or weightlifting, at least 2 times a week.  Take medicines as told by your health care provider.  Do not use any products that contain nicotine or tobacco, such as cigarettes and e-cigarettes. If you need help quitting, ask your health care provider.  Work with a counselor or diabetes educator to identify strategies to manage stress and any emotional and social challenges. Questions to ask a health care provider  Do I need to meet with a diabetes educator?  Do I need to meet with a dietitian?  What number can I call if I have questions?  When are the best   times to check my blood glucose? Where to find more information:  American Diabetes Association: diabetes.org  Academy of Nutrition and Dietetics: www.eatright.org  National Institute of Diabetes and Digestive and Kidney Diseases (NIH): www.niddk.nih.gov Summary  A healthy meal plan will help you control your blood glucose and maintain a healthy lifestyle.  Working with a diet and nutrition specialist (dietitian) can help you make a meal plan that is best for you.  Keep in mind that carbohydrates (carbs) and alcohol have immediate effects on your blood glucose levels. It is important to count carbs and to use alcohol carefully. This information is not intended to replace advice given to you by your health care provider. Make sure you discuss any questions you have with your health care provider. Document Revised: 02/04/2017 Document Reviewed: 03/29/2016 Elsevier Patient Education  2020 [employer]  

## 2019-08-08 NOTE — Progress Notes (Signed)
Rachel Luna 64 y.o.   Chief Complaint  Patient presents with  . Diabetes    f/u   . Tick Removal    on her back on 26 of MAy     HISTORY OF PRESENT ILLNESS: This is a 64 y.o. female with history of diabetes here for follow-up. Last office visit for diabetes August 2020. #1 diabetes: On glimepiride and Januvia, intolerant to Metformin; ran out of Januvia medication 2 weeks ago. Lab Results  Component Value Date   HGBA1C 7.9 (A) 10/16/2018  #2 hypertension: On losartan-hydrochlorothiazide 100-25 mg daily. BP Readings from Last 3 Encounters:  08/08/19 130/80  02/15/19 137/78  10/16/18 124/76  #3 dyslipidemia: On rosuvastatin 40 mg daily. #4 chronic pain syndrome #5 bipolar disorder with history of depression and anxiety as well.  Sees psychiatrist on a regular basis. Status post tick bite to her right lower back 1 week ago.  Asymptomatic.  Complete tick removal.  No rashes.  HPI   Prior to Admission medications   Medication Sig Start Date End Date Taking? Authorizing Provider  ACCU-CHEK AVIVA PLUS test strip Use in the morning and after meals as needed. 07/13/18  Yes Brinae Woods, Ines Bloomer, MD  baclofen (LIORESAL) 10 MG tablet TAKE 1 TABLET BY MOUTH 3 TIMES DAILY 01/30/19  Yes Kathrynn Ducking, MD  carbamazepine (TEGRETOL) 200 MG tablet Take 0.5 tablets (100 mg total) by mouth daily. 02/15/19  Yes Suzzanne Cloud, NP  cetirizine (ZYRTEC) 10 MG tablet Take 10 mg by mouth daily.   Yes [provider]  clonazePAM (KLONOPIN) 0.5 MG tablet Take 1 tablet (0.5 mg total) by mouth at bedtime. 06/21/19  Yes Suzzanne Cloud, NP  DULoxetine (CYMBALTA) 30 MG capsule Take 1 capsule (30 mg total) by mouth daily. To be combined with 60 mg 04/24/19  Yes Eappen, Ria Clock, MD  DULoxetine (CYMBALTA) 60 MG capsule Take 1 capsule (60 mg total) by mouth daily. 04/24/19  Yes Ursula Alert, MD  estradiol (ESTRACE) 0.5 MG tablet Take 1 tablet (0.5 mg total) by mouth daily. 12/26/18  Yes Shelly Bombard, MD  gabapentin (NEURONTIN) 800 MG tablet Take 1 tablet (800 mg total) by mouth 3 (three) times daily. 04/18/19  Yes Horald Pollen, MD  glimepiride (AMARYL) 4 MG tablet Take 1 tablet (4 mg total) by mouth daily with breakfast. 04/18/19 08/08/19 Yes Jeryn Bertoni, Ines Bloomer, MD  JANUVIA 50 MG tablet TAKE 1 TABLET BY MOUTH ONCE DAILY 08/03/19  Yes Tylar Amborn, Ines Bloomer, MD  losartan-hydrochlorothiazide (HYZAAR) 100-25 MG tablet TAKE 1 TABLET BY MOUTH ONCE DAILY 06/21/19  Yes Danija Gosa, Ines Bloomer, MD  meloxicam (MOBIC) 15 MG tablet Take 15 mg daily by mouth.  07/04/15  Yes [provider]  pramipexole (MIRAPEX) 0.75 MG tablet TAKE 1 TABLET BY MOUTH ONCE EVERY MORNING AND 2 TABLETS ONCE EVERY EVENING 06/21/19  Yes Kathrynn Ducking, MD  rosuvastatin (CRESTOR) 40 MG tablet Take 1 tablet (40 mg total) by mouth daily. 04/18/19 08/08/19 Yes Nikol Lemar, Ines Bloomer, MD  triamcinolone (KENALOG) 0.025 % ointment Apply 1 application topically 2 (two) times daily. 05/22/19  Yes Trula Slade, DPM    Allergies  Allergen Reactions  . Wellbutrin [Bupropion] Other (See Comments)    Irritability and Agitation  . Belsomra [Suvorexant]     Vivid dreams  . Geodon [Ziprasidone Hcl] Other (See Comments)    Worsened Restless Leg Syndrome  . Lisinopril Cough  . Neupro [Rotigotine] Rash    Patient Active Problem List  Diagnosis Date Noted  . Restless leg syndrome 02/15/2019  . Bipolar I disorder, most recent episode mixed ([place]) 09/14/2018  . GAD (generalized anxiety disorder) 09/14/2018  . 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 Type 2 diabetes mellitus with hyperglycemia, without long-term current use of insulin ([place]) 07/13/2018  . Seizure disorder ([place]) 10/19/2017  . 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 ANA positive 03/05/2015  . Elevated rheumatoid factor 03/05/2015  . Avitaminosis D 09/04/2014  . Chronic low back pain 02/18/2014  . Menopausal hot flushes 08/14/2012  .  Arthritis of knee, degenerative 04/06/2011  . Benign essential HTN 07/13/2010  . Chronic pain 01/08/2010  . Affective disorder, major 11/05/2009  . Major depressive disorder 11/05/2009    Past Medical History:  Diagnosis Date  . Anxiety   . Arthritis   . Bipolar disorder ([place])   . chronic low back pain   . Chronic low back pain 02/18/2014  . Depression   . Diabetes mellitus without complication ([place])   . Diabetes mellitus, type II ([place])   . Fibroids   . GERD (gastroesophageal reflux disease)   . Gout   . Hyperlipidemia   . Hypertension   . Obesity     Past Surgical History:  Procedure Laterality Date  . ABDOMINAL HYSTERECTOMY    . CHOLECYSTECTOMY    . FOOT SURGERY Left   . KNEE ARTHROPLASTY    . OVARIAN CYST REMOVAL    . SPINE SURGERY      Social History   Socioeconomic History  . Marital status: Divorced    Spouse name: Not on file  . Number of children: 0  . Years of education: 10  . Highest education level: 10th grade  Occupational History    Employer: UNEMPLOYED  Tobacco Use  . Smoking status: Passive Smoke Exposure - Never Smoker  . Smokeless tobacco: Never Used  . Tobacco comment: room mate smokes  Substance and Sexual Activity  . Alcohol use: No    Alcohol/week: 0.0 standard drinks  . Drug use: No  . Sexual activity: Not Currently  Other Topics Concern  . Not on file  Social History Narrative   Patient is divorced and lives with a roommate.   Patient is on disability.   Patient has a 10th grade education.   Patient is right-handed.   Patient drinks 6 sodas daily.         Social Determinants of Health   Financial Resource Strain:   . Difficulty of Paying Living Expenses:   Food Insecurity:   . Worried About Charity fundraiser in the Last Year:   . Arboriculturist in the Last Year:   Transportation Needs:   . Film/video editor (Medical):   Marland Kitchen Lack of Transportation (Non-Medical):   Physical Activity:   . Days of Exercise per Week:    . Minutes of Exercise per Session:   Stress:   . Feeling of Stress :   Social Connections:   . Frequency of Communication with Friends and Family:   . Frequency of Social Gatherings with Friends and Family:   . Attends Religious Services:   . Active Member of Clubs or Organizations:   . Attends Archivist Meetings:   Marland Kitchen Marital Status:   Intimate Partner Violence:   . Fear of Current or Ex-Partner:   . Emotionally Abused:   Marland Kitchen Physically Abused:   . Sexually Abused:     Family History  Problem Relation Age  of Onset  . Diabetes Mother   . Cancer Mother        breast  . Heart disease Mother   . Mental illness Mother        bipolar disorder  . Stroke Mother   . Breast cancer Mother   . Hypertension Mother   . High Cholesterol Mother   . Diabetes Father   . Hypertension Father   . Diabetes Brother   . Bipolar disorder Brother   . Diabetes Brother   . Breast cancer Maternal Aunt   . Breast cancer Maternal Grandmother      Review of Systems  Constitutional: Negative.  Negative for chills and fever.  HENT: Negative.  Negative for congestion and sore throat.   Respiratory: Negative.  Negative for cough and shortness of breath.   Cardiovascular: Negative.  Negative for chest pain and palpitations.  Gastrointestinal: Negative.  Negative for nausea and vomiting.  Genitourinary: Negative.  Negative for dysuria and hematuria.  Musculoskeletal: Negative.   Skin: Negative.  Negative for rash.  Neurological: Negative for dizziness and headaches.  All other systems reviewed and are negative.  Today's Vitals   08/08/19 0952  BP: 130/80  Pulse: 91  Temp: (!) 97 F (36.1 C)  TempSrc: Temporal  SpO2: 95%  Weight: 282 lb 6.4 oz (128.1 kg)  Height: _0  (1.626 m)   Body mass index is 48.47 kg/m.   Physical Exam Vitals reviewed.  Constitutional:      Appearance: Normal appearance. She is obese.  HENT:     Head: Normocephalic.  Eyes:     Extraocular  Movements: Extraocular movements intact.     Conjunctiva/sclera: Conjunctivae normal.     Pupils: Pupils are equal, round, and reactive to light.  Cardiovascular:     Rate and Rhythm: Normal rate and regular rhythm.     Pulses: Normal pulses.     Heart sounds: Normal heart sounds.  Pulmonary:     Effort: Pulmonary effort is normal.     Breath sounds: Normal breath sounds.  Musculoskeletal:        General: Normal range of motion.  Skin:    General: Skin is warm and dry.     Comments: Status post tick bite right lumbar area.  Small scab visible.  No erythema or bull's-eye's rash.  Neurological:     General: No focal deficit present.     Mental Status: She is alert and oriented to person, place, and time.  Psychiatric:        Mood and Affect: Mood normal.        Behavior: Behavior normal.    Results for orders placed or performed in visit on 08/08/19 (from the past 24 hour(s))  POCT glucose (manual entry)     Status: Abnormal   Collection Time: 08/08/19 10:12 AM  Result Value Ref Range   POC Glucose 221 (A) 70 - 99 mg/dl  POCT glycosylated hemoglobin (Hb A1C)     Status: Abnormal   Collection Time: 08/08/19 10:17 AM  Result Value Ref Range   Hemoglobin A1C 8.6 (A) 4.0 - 5.6 %   HbA1c POC (<> result, manual entry)     HbA1c, POC (prediabetic range)     HbA1c, POC (controlled diabetic range)     A total of 30 minutes was spent with the patient, greater than 50% of which was in counseling/coordination of care regarding diabetes and its management, cardiovascular risks associated with this condition, review of all medications and changes,  review of most recent blood work including today's hemoglobin A1c, review of most recent office visit notes, diet and nutrition, prognosis and need for follow-up in 3 months.    ASSESSMENT & PLAN: Hypertension associated with diabetes ([place]) Well-controlled hypertension.  Continue Hyzaar 100-25 mg daily.  No changes. Uncontrolled diabetes with  hemoglobin A1c higher than before at 8.6.  Patient has been off [place] for 2 weeks.  Continue glimepiride 4 mg daily and increase Januvia to 100 mg daily.  Diet and nutrition discussed.  Follow-up in 3 months.  Charlottie was seen today for diabetes and tick removal.  Diagnoses and all orders for this visit:  Hypertension associated with diabetes ([place])  Type 2 diabetes mellitus with hyperglycemia, without long-term current use of insulin ([facility]) -     Lipid panel -     CMP14+EGFR -     POCT glucose (manual entry) -     POCT glycosylated hemoglobin (Hb A1C) -     sitaGLIPtin (JANUVIA) 100 MG tablet; Take 1 tablet (100 mg total) by mouth daily.  Dyslipidemia associated with type 2 diabetes mellitus ([facility])  Chronic pain syndrome  Bipolar I disorder, most recent episode mixed ([place])  GAD (generalized anxiety disorder)    Patient Instructions       If you have lab work done today you will be contacted with your lab results within the next 2 weeks.  If you have not heard from [place] then please contact us. The fastest way to get your results is to register for My Chart.   IF you received an x-ray today, you will receive an invoice from [facility]. Please contact [facility] at [phone] with questions or concerns regarding your invoice.   IF you received labwork today, you will receive an invoice from [facility]. Please contact [facility] at [phone] with questions or concerns regarding your invoice.   Our billing staff will not be able to assist you with questions regarding bills from these companies.  You will be contacted with the lab results as soon as they are available. The fastest way to get your results is to activate your My Chart account. Instructions are located on the last page of this paperwork. If you have not heard from [place] regarding the results in 2 weeks, please contact this office.      Diabetes Mellitus and Nutrition, Adult When you have diabetes  (diabetes mellitus), it is very important to have healthy eating habits because your blood sugar (glucose) levels are greatly affected by what you eat and drink. Eating healthy foods in the appropriate amounts, at about the same times every day, can help you:  Control your blood glucose.  Lower your risk of heart disease.  Improve your blood pressure.  Reach or maintain a healthy weight. Every person with diabetes is different, and each person has different needs for a meal plan. Your health care provider may recommend that you work with a diet and nutrition specialist (dietitian) to make a meal plan that is best for you. Your meal plan may vary depending on factors such as:  The calories you need.  The medicines you take.  Your weight.  Your blood glucose, blood pressure, and cholesterol levels.  Your activity level.  Other health conditions you have, such as heart or kidney disease. How do carbohydrates affect me? Carbohydrates, also called carbs, affect your blood glucose level more than any other type of food. Eating carbs naturally raises the amount of glucose in your blood. Carb  counting is a method for keeping track of how many carbs you eat. Counting carbs is important to keep your blood glucose at a healthy level, especially if you use insulin or take certain oral diabetes medicines. It is important to know how many carbs you can safely have in each meal. This is different for every person. Your dietitian can help you calculate how many carbs you should have at each meal and for each snack. Foods that contain carbs include:  Bread, cereal, rice, pasta, and crackers.  Potatoes and corn.  Peas, beans, and lentils.  Milk and yogurt.  Fruit and juice.  Desserts, such as cakes, cookies, ice cream, and candy. How does alcohol affect me? Alcohol can cause a sudden decrease in blood glucose (hypoglycemia), especially if you use insulin or take certain oral diabetes medicines.  Hypoglycemia can be a life-threatening condition. Symptoms of hypoglycemia (sleepiness, dizziness, and confusion) are similar to symptoms of having too much alcohol. If your health care provider says that alcohol is safe for you, follow these guidelines:  Limit alcohol intake to no more than 1 drink per day for nonpregnant women and 2 drinks per day for men. One drink equals 12 oz of beer, 5 oz of wine, or 1 oz of hard liquor.  Do not drink on an empty stomach.  Keep yourself hydrated with water, diet soda, or unsweetened iced tea.  Keep in mind that regular soda, juice, and other mixers may contain a lot of sugar and must be counted as carbs. What are tips for following this plan?  Reading food labels  Start by checking the serving size on the "Nutrition Facts" label of packaged foods and drinks. The amount of calories, carbs, fats, and other nutrients listed on the label is based on one serving of the item. Many items contain more than one serving per package.  Check the total grams (g) of carbs in one serving. You can calculate the number of servings of carbs in one serving by dividing the total carbs by 15. For example, if a food has 30 g of total carbs, it would be equal to 2 servings of carbs.  Check the number of grams (g) of saturated and trans fats in one serving. Choose foods that have low or no amount of these fats.  Check the number of milligrams (mg) of salt (sodium) in one serving. Most people should limit total sodium intake to less than 2,300 mg per day.  Always check the nutrition information of foods labeled as "low-fat" or "nonfat". These foods may be higher in added sugar or refined carbs and should be avoided.  Talk to your dietitian to identify your daily goals for nutrients listed on the label. Shopping  Avoid buying canned, premade, or processed foods. These foods tend to be high in fat, sodium, and added sugar.  Shop around the outside edge of the grocery store.  This includes fresh fruits and vegetables, bulk grains, fresh meats, and fresh dairy. Cooking  Use low-heat cooking methods, such as baking, instead of high-heat cooking methods like deep frying.  Cook using healthy oils, such as olive, canola, or sunflower oil.  Avoid cooking with butter, cream, or high-fat meats. Meal planning  Eat meals and snacks regularly, preferably at the same times every day. Avoid going long periods of time without eating.  Eat foods high in fiber, such as fresh fruits, vegetables, beans, and whole grains. Talk to your dietitian about how many servings of carbs you can  eat at each meal.  Eat 4-6 ounces (oz) of lean protein each day, such as lean meat, chicken, fish, eggs, or tofu. One oz of lean protein is equal to: ? 1 oz of meat, chicken, or fish. ? 1 egg. ?  cup of tofu.  Eat some foods each day that contain healthy fats, such as avocado, nuts, seeds, and fish. Lifestyle  Check your blood glucose regularly.  Exercise regularly as told by your health care provider. This may include: ? 150 minutes of moderate-intensity or vigorous-intensity exercise each week. This could be brisk walking, biking, or water aerobics. ? Stretching and doing strength exercises, such as yoga or weightlifting, at least 2 times a week.  Take medicines as told by your health care provider.  Do not use any products that contain nicotine or tobacco, such as cigarettes and e-cigarettes. If you need help quitting, ask your health care provider.  Work with a Social worker or diabetes educator to identify strategies to manage stress and any emotional and social challenges. Questions to ask a health care provider  Do I need to meet with a diabetes educator?  Do I need to meet with a dietitian?  What number can I call if I have questions?  When are the best times to check my blood glucose? Where to find more information:  American Diabetes Association: diabetes.org  Academy of  Nutrition and Dietetics: www.eatright.[employer] of Diabetes and Digestive and Kidney Diseases (NIH): [employer] Summary  A healthy meal plan will help you control your blood glucose and maintain a healthy lifestyle.  Working with a diet and nutrition specialist (dietitian) can help you make a meal plan that is best for you.  Keep in mind that carbohydrates (carbs) and alcohol have immediate effects on your blood glucose levels. It is important to count carbs and to use alcohol carefully. This information is not intended to replace advice given to you by your health care provider. Make sure you discuss any questions you have with your health care provider. Document Revised: 02/04/2017 Document Reviewed: 03/29/2016 Elsevier Patient Education  2020 Elsevier Inc.      Agustina Caroli, MD [facility]

## 2019-08-08 NOTE — Assessment & Plan Note (Signed)
Well-controlled hypertension.  Continue Hyzaar 100-25 mg daily.  No changes. Uncontrolled diabetes with hemoglobin A1c higher than before at 8.6.  Patient has been off [place] for 2 weeks.  Continue glimepiride 4 mg daily and increase Januvia to 100 mg daily.  Diet and nutrition discussed.  Follow-up in 3 months.

## 2019-08-09 ENCOUNTER — Telehealth (INDEPENDENT_AMBULATORY_CARE_PROVIDER_SITE_OTHER): Payer: Medicare Other | Admitting: Psychiatry

## 2019-08-09 ENCOUNTER — Encounter: Payer: Self-pay | Admitting: Psychiatry

## 2019-08-09 DIAGNOSIS — F3178 Bipolar disorder, in full remission, most recent episode mixed: Secondary | ICD-10-CM

## 2019-08-09 DIAGNOSIS — F5105 Insomnia due to other mental disorder: Secondary | ICD-10-CM | POA: Diagnosis not present

## 2019-08-09 DIAGNOSIS — F411 Generalized anxiety disorder: Secondary | ICD-10-CM | POA: Diagnosis not present

## 2019-08-09 NOTE — Progress Notes (Signed)
Provider Location : [facility] Patient Location : Home  Virtual Visit via Video Note  I connected with Rachel Luna on 08/09/19 at 10:40 AM EDT by a video enabled telemedicine application and verified that I am speaking with the correct person using two identifiers.   I discussed the limitations of evaluation and management by telemedicine and the availability of in person appointments. The patient expressed understanding and agreed to proceed.   I discussed the assessment and treatment plan with the patient. The patient was provided an opportunity to ask questions and all were answered. The patient agreed with the plan and demonstrated an understanding of the instructions.   The patient was advised to call back or seek an in-person evaluation if the symptoms worsen or if the condition fails to improve as anticipated.   [place] MD/PA/NP OP Progress Note  08/09/2019 10:49 AM Rachel Luna  MRN:  [id number]  Chief Complaint:  Chief Complaint    Follow-up     HPI: Rachel Luna is a 64 year old Caucasian female, divorced, disabled, lives in [place], has a history of bipolar disorder, GAD, insomnia, seizure disorder, morbid obesity, chronic pain, neuropathy was evaluated by telemedicine today.  Patient today reports she is currently struggling with right arm pain.  She reports she had MRIs done and she may need an epidural injection soon.  She is hoping that her pain will get better with the injection.  She reports she does have anxiety about her pain.  She also reports sleep as restless on and off since she has to lay in bed in a particular way to make herself comfortable.  Patient however reports she is compliant on her Cymbalta.  She takes melatonin at bedtime for sleep.  She reports she is aware that her current sleep problems and anxiety are due to the pain and currently is not interested in any medication changes.  She continues to follow-up with [name] for therapy.  Patient denies  any suicidality, homicidality or perceptual disturbances.  Patient denies any other concerns today.  Visit Diagnosis:    ICD-10-CM   1. Bipolar disorder, in full remission, most recent episode mixed ([place])  F31.78   2. GAD (generalized anxiety disorder)  F41.1   3. Insomnia due to mental disorder  F51.05     Past Psychiatric History: I have reviewed past psychiatric history from my progress note on 10/19/2017.  Past trials of Abilify, Klonopin, Elavil, Cymbalta, Tegretol, Effexor, trazodone, Belsomra  Past Medical History:  Past Medical History:  Diagnosis Date  . Anxiety   . Arthritis   . Bipolar disorder ([place])   . chronic low back pain   . Chronic low back pain 02/18/2014  . Depression   . Diabetes mellitus without complication ([place])   . Diabetes mellitus, type II ([place])   . Fibroids   . GERD (gastroesophageal reflux disease)   . Gout   . Hyperlipidemia   . Hypertension   . Obesity     Past Surgical History:  Procedure Laterality Date  . ABDOMINAL HYSTERECTOMY    . CHOLECYSTECTOMY    . FOOT SURGERY Left   . KNEE ARTHROPLASTY    . OVARIAN CYST REMOVAL    . SPINE SURGERY      Family Psychiatric History: I have reviewed family psychiatric history from my progress note on 10/19/2017  Family History:  Family History  Problem Relation Age of Onset  . Diabetes Mother   . Cancer Mother  breast  . Heart disease Mother   . Mental illness Mother        bipolar disorder  . Stroke Mother   . Breast cancer Mother   . Hypertension Mother   . High Cholesterol Mother   . Diabetes Father   . Hypertension Father   . Diabetes Brother   . Bipolar disorder Brother   . Diabetes Brother   . Breast cancer Maternal Aunt   . Breast cancer Maternal Grandmother     Social History: Reviewed social history from my progress note on 10/19/2017 Social History   Socioeconomic History  . Marital status: Divorced    Spouse name: Not on file  . Number of children: 0  . Years of  education: 10  . Highest education level: 10th grade  Occupational History    Employer: UNEMPLOYED  Tobacco Use  . Smoking status: Passive Smoke Exposure - Never Smoker  . Smokeless tobacco: Never Used  . Tobacco comment: room mate smokes  Substance and Sexual Activity  . Alcohol use: No    Alcohol/week: 0.0 standard drinks  . Drug use: No  . Sexual activity: Not Currently  Other Topics Concern  . Not on file  Social History Narrative   Patient is divorced and lives with a roommate.   Patient is on disability.   Patient has a 10th grade education.   Patient is right-handed.   Patient drinks 6 sodas daily.         Social Determinants of Health   Financial Resource Strain:   . Difficulty of Paying Living Expenses:   Food Insecurity:   . Worried About Charity fundraiser in the Last Year:   . Arboriculturist in the Last Year:   Transportation Needs:   . Film/video editor (Medical):   Marland Kitchen Lack of Transportation (Non-Medical):   Physical Activity:   . Days of Exercise per Week:   . Minutes of Exercise per Session:   Stress:   . Feeling of Stress :   Social Connections:   . Frequency of Communication with Friends and Family:   . Frequency of Social Gatherings with Friends and Family:   . Attends Religious Services:   . Active Member of Clubs or Organizations:   . Attends Archivist Meetings:   Marland Kitchen Marital Status:     Allergies:  Allergies  Allergen Reactions  . Wellbutrin [Bupropion] Other (See Comments)    Irritability and Agitation  . Belsomra [Suvorexant]     Vivid dreams  . Geodon [Ziprasidone Hcl] Other (See Comments)    Worsened Restless Leg Syndrome  . Lisinopril Cough  . Neupro [Rotigotine] Rash    Metabolic Disorder Labs: Lab Results  Component Value Date   HGBA1C 8.6 (A) 08/08/2019   No results found for: PROLACTIN Lab Results  Component Value Date   CHOL 185 08/08/2019   TRIG 186 (H) 08/08/2019   HDL 65 08/08/2019   CHOLHDL 2.8  08/08/2019   VLDL 41 (H) 03/25/2014   LDLCALC 89 08/08/2019   [place] 75 10/16/2018   Lab Results  Component Value Date   TSH 2.030 07/13/2018    Therapeutic Level Labs: No results found for: LITHIUM No results found for: VALPROATE No components found for:  CBMZ  Current Medications: Current Outpatient Medications  Medication Sig Dispense Refill  . ACCU-CHEK AVIVA PLUS test strip Use in the morning and after meals as needed. 100 each 5  . baclofen (LIORESAL) 10 MG tablet TAKE  1 TABLET BY MOUTH 3 TIMES DAILY 270 each 1  . carbamazepine (TEGRETOL) 200 MG tablet Take 0.5 tablets (100 mg total) by mouth daily. 45 tablet 1  . cetirizine (ZYRTEC) 10 MG tablet Take 10 mg by mouth daily.    . clonazePAM (KLONOPIN) 0.5 MG tablet Take 1 tablet (0.5 mg total) by mouth at bedtime. 30 tablet 3  . DULoxetine (CYMBALTA) 30 MG capsule Take 1 capsule (30 mg total) by mouth daily. To be combined with 60 mg 90 capsule 1  . DULoxetine (CYMBALTA) 60 MG capsule Take 1 capsule (60 mg total) by mouth daily. 90 capsule 1  . estradiol (ESTRACE) 0.5 MG tablet Take 1 tablet (0.5 mg total) by mouth daily. 30 tablet 11  . gabapentin (NEURONTIN) 800 MG tablet Take 1 tablet (800 mg total) by mouth 3 (three) times daily. 270 tablet 1  . glimepiride (AMARYL) 4 MG tablet Take 1 tablet (4 mg total) by mouth daily with breakfast. 90 tablet 1  . losartan-hydrochlorothiazide (HYZAAR) 100-25 MG tablet TAKE 1 TABLET BY MOUTH ONCE DAILY 90 tablet 0  . meloxicam (MOBIC) 15 MG tablet Take 15 mg daily by mouth.     . pramipexole (MIRAPEX) 0.75 MG tablet TAKE 1 TABLET BY MOUTH ONCE EVERY MORNING AND 2 TABLETS ONCE EVERY EVENING 270 tablet 2  . rosuvastatin (CRESTOR) 40 MG tablet Take 1 tablet (40 mg total) by mouth daily. 90 tablet 3  . sitaGLIPtin (JANUVIA) 100 MG tablet Take 1 tablet (100 mg total) by mouth daily. 90 tablet 1  . triamcinolone (KENALOG) 0.025 % ointment Apply 1 application topically 2 (two) times daily. 30 g 0    No current facility-administered medications for this visit.     Musculoskeletal: Strength & Muscle Tone: UTA Gait & Station: normal Patient leans: N/A  Psychiatric Specialty Exam: Review of Systems  Musculoskeletal:       Right sided shoulder, arm pain  Psychiatric/Behavioral: Positive for sleep disturbance (restlessness). The patient is nervous/anxious.   All other systems reviewed and are negative.   There were no vitals taken for this visit.There is no height or weight on file to calculate BMI.  General Appearance: Casual  Eye Contact:  Fair  Speech:  Clear and Coherent  Volume:  Normal  Mood:  Anxious  Affect:  Congruent  Thought Process:  Goal Directed and Descriptions of Associations: Intact  Orientation:  Full (Time, Place, and Person)  Thought Content: Logical   Suicidal Thoughts:  No  Homicidal Thoughts:  No  Memory:  Immediate;   Fair Recent;   Fair Remote;   Fair  Judgement:  Fair  Insight:  Fair  Psychomotor Activity:  Normal  Concentration:  Concentration: Fair and Attention Span: Fair  Recall:  [employer] of Knowledge: Fair  Language: Fair  Akathisia:  No  Handed:  Right  AIMS (if indicated): UTA  Assets:  Communication Skills Desire for Improvement Housing Social Support  ADL's:  Intact  Cognition: WNL  Sleep:  restlessness   Screenings: PHQ2-9     Office Visit from 08/08/2019 in [facility] at [facility] from 05/02/2019 in [facility] at [facility] from 04/18/2019 in [facility] at [facility] from 10/16/2018 in [facility] at [facility] from 07/13/2018 in [facility] at [facility] Total Score  4  6  0  0  3  PHQ-9 Total Score  16  22  --  --  16  Assessment and Plan: Rachel Luna is a 64 year old Caucasian female, divorced, disabled, lives in [place], has a history of bipolar disorder, anxiety disorder, insomnia, seizure disorder, neuropathy, chronic pain, morbid obesity was evaluated by  telemedicine today.  Patient is currently stable on current medication regimen however does have psychosocial stressors of pain.  She will continue to follow-up with her providers for the same.  Plan as noted below.  Plan Bipolar disorder in remission Cymbalta 90 mg p.o. daily Tegretol 100 mg p.o. daily-prescribed by neurology for seizures. Continue CBT with [name]  Anxiety disorder-stable Continue CBT Continue Cymbalta 90 mg p.o. daily  Insomnia-restless due to pain Continue melatonin as prescribed.  Follow-up in clinic in 3 months or sooner if needed.  I have spent atleast 20 minutes non face to face with patient today. More than 50 % of the time was spent for preparing to see the patient ( e.g., review of test, records ), ordering medications and test ,psychoeducation and supportive psychotherapy and care coordination,as well as documenting clinical information in electronic health record. This note was generated in part or whole with voice recognition software. Voice recognition is usually quite accurate but there are transcription errors that can and very often do occur. I apologize for any typographical errors that were not detected and corrected.         Ursula Alert, MD 08/09/2019, 10:49 AM

## 2019-08-13 ENCOUNTER — Ambulatory Visit
Admission: RE | Admit: 2019-08-13 | Discharge: 2019-08-13 | Disposition: A | Discharge status: Home / Self Care | Payer: Medicare Other | Source: Ambulatory Visit | Attending: Physical Medicine and Rehabilitation | Admitting: Physical Medicine and Rehabilitation

## 2019-08-13 ENCOUNTER — Other Ambulatory Visit: Payer: Self-pay

## 2019-08-13 DIAGNOSIS — M4802 Spinal stenosis, cervical region: Secondary | ICD-10-CM

## 2019-08-13 MED ORDER — IOPAMIDOL (ISOVUE-M 300) INJECTION 61%
1.0000 mL | Freq: Once | INTRAMUSCULAR | Status: AC | PRN
  Administered 2019-08-13: 1 mL via EPIDURAL

## 2019-08-13 MED ORDER — TRIAMCINOLONE ACETONIDE 40 MG/ML IJ SUSP (RADIOLOGY)
60.0000 mg | Freq: Once | INTRAMUSCULAR | Status: AC
  Administered 2019-08-13: 60 mg via EPIDURAL

## 2019-08-13 NOTE — Discharge Instructions (Signed)

## 2019-08-16 ENCOUNTER — Ambulatory Visit: Payer: Medicare Other | Admitting: Psychiatry

## 2019-08-22 ENCOUNTER — Telehealth: Payer: Self-pay | Admitting: Neurology

## 2019-08-22 NOTE — Telephone Encounter (Signed)
[facility] has called to report that pt just transferred over to them.  Almyra Free is asking for a call from [facility] to discuss pt's Ropineral .  Almyra Free can be reached at [phone]

## 2019-08-22 NOTE — Telephone Encounter (Signed)
I called and spoke to pharmacy tech re: medication that we prescribe for her mirapex 0.71m tabs (1 in am and 2 pm).  Received last fill from [facility] 06-21-19 #270 tabs.  Estill Bamberg with [facility] will call them at [facility] re: what refills they transferred to them.  I LMVM for pt that last note that I have was as previously stated for mirapex.  Will send message to SS/NP about this.  Pt has appt 10-16-19. Address Monday.

## 2019-08-23 NOTE — Telephone Encounter (Addendum)
Received call from patient who asked if her mirapex refill was sent to [facility] . She no longer uses [facility]. Last Rx was in April x 3 months. I reviewed [facility] RN's note with her. She has a few pills left, thinks she can get through the weekend. She occasionally takes an extra tab at night. I advised she call [facility] to see if they have another refill on file. Advised her message will be sent to Judson Roch, NP on Monday, and if she takes extra she needs a new Rx. She verbalized understanding, appreciation.

## 2019-08-23 NOTE — Telephone Encounter (Signed)
When I spoke to Rachel Luna, pt relayed to them that she was taking 4 tabs daily and wanted to get that amount.

## 2019-08-27 MED ORDER — PRAMIPEXOLE DIHYDROCHLORIDE 0.75 MG PO TABS: ORAL_TABLET | ORAL | 1 refills | Status: DC

## 2019-08-27 NOTE — Telephone Encounter (Signed)
Pt as taking 4 tabs daily (on her own),  She was requesting increase per pharmacist).  She is transferring to [facility].

## 2019-08-27 NOTE — Addendum Note (Signed)
Addended by: Suzzanne Cloud on: 08/27/2019 03:43 PM   Modules accepted: Orders

## 2019-08-27 NOTE — Telephone Encounter (Signed)
I have on record we are giving her Mirapex 0.75 mg tablet, 1 in the morning, 2 in the evening. Do I need to send new rx to new pharmacy [facility] or is she asking for new rx to include a few extra tablets if needed, I would prefer to keep dosing as is. She is also taking clonazepam for RLS symptoms, gabapentin 800 mg 3 times daily. Looks like allergy to Neupro.

## 2019-08-28 ENCOUNTER — Telehealth: Payer: Self-pay | Admitting: Emergency Medicine

## 2019-08-28 ENCOUNTER — Other Ambulatory Visit: Payer: Self-pay

## 2019-08-28 ENCOUNTER — Other Ambulatory Visit: Payer: Self-pay | Admitting: Neurology

## 2019-08-28 DIAGNOSIS — I10 Essential (primary) hypertension: Secondary | ICD-10-CM

## 2019-08-28 MED ORDER — BACLOFEN 10 MG PO TABS: 10.0000 mg | ORAL_TABLET | Freq: Three times a day (TID) | ORAL | 1 refills | Status: DC

## 2019-08-28 MED ORDER — CARBAMAZEPINE 200 MG PO TABS: 100.0000 mg | ORAL_TABLET | Freq: Every day | ORAL | 1 refills | Status: DC

## 2019-08-28 MED ORDER — LOSARTAN POTASSIUM-HCTZ 100-25 MG PO TABS: 1.0000 | ORAL_TABLET | Freq: Every day | ORAL | 0 refills | Status: DC

## 2019-08-28 NOTE — Telephone Encounter (Signed)
Pharmacy called and is needing a refill on   losartan-hydrochlorothiazide [facility]) 100-25 MG tablet [[id number]]   New pharmacy is [facility] [phone] [address] [place], [place] [address]

## 2019-08-28 NOTE — Telephone Encounter (Signed)
Pt is with a new pharmacy, requested medication for bp to be sent to [facility]

## 2019-08-28 NOTE — Telephone Encounter (Signed)
Refills sent

## 2019-08-28 NOTE — Telephone Encounter (Signed)
Pt is needing a refill on her carbamazepine (TEGRETOL) 200 MG tablet and her baclofen (LIORESAL) 10 MG tablet sent to the [facility]

## 2019-09-14 ENCOUNTER — Telehealth: Payer: Self-pay | Admitting: Neurology

## 2019-09-14 NOTE — Telephone Encounter (Signed)
Pt called needing a refill on her pramipexole (MIRAPEX) 0.75 MG tablet sent in to the [facility]

## 2019-09-17 NOTE — Telephone Encounter (Signed)
Called the pharmacy and they do have the prescription from 08-27-19 and will get ready for pt.  I relayed to pt that they did have prescription she verbalized understanding.

## 2019-10-04 ENCOUNTER — Telehealth: Payer: Self-pay | Admitting: Emergency Medicine

## 2019-10-04 NOTE — Telephone Encounter (Signed)
Patient is wanting a mammogram/ her RGT breast has been hurting .[facility] ssked her to call us and set that up for her   Please advise

## 2019-10-04 NOTE — Telephone Encounter (Signed)
Patient is having pain in the right breast can we put in an referral or since patient is in pain we need to see her before we place the order. Patient do have an upcoming appt on 11/07/19. Please advise if we can just place the order or she need to be seen since this is not a routine mammogram.

## 2019-10-05 NOTE — Telephone Encounter (Signed)
Please place the order. Thanks.

## 2019-10-08 ENCOUNTER — Other Ambulatory Visit: Payer: Self-pay | Admitting: Emergency Medicine

## 2019-10-08 DIAGNOSIS — Z1231 Encounter for screening mammogram for malignant neoplasm of breast: Secondary | ICD-10-CM

## 2019-10-08 DIAGNOSIS — N6310 Unspecified lump in the right breast, unspecified quadrant: Secondary | ICD-10-CM

## 2019-10-08 NOTE — Telephone Encounter (Signed)
Breast mammogram and right breast US for painful right breast has been ordered.  Patient has been notified that orders has been placed and the referral team will get back with you with the appt. If have not heard from the referral team in the next few days she is welcome to call the breast center to schedule appt cause the orders was already in the computer

## 2019-10-09 ENCOUNTER — Other Ambulatory Visit: Payer: Self-pay | Admitting: Physical Medicine and Rehabilitation

## 2019-10-09 DIAGNOSIS — M4802 Spinal stenosis, cervical region: Secondary | ICD-10-CM

## 2019-10-15 ENCOUNTER — Other Ambulatory Visit: Payer: Self-pay | Admitting: Emergency Medicine

## 2019-10-15 DIAGNOSIS — N644 Mastodynia: Secondary | ICD-10-CM

## 2019-10-16 ENCOUNTER — Encounter: Payer: Self-pay | Admitting: Neurology

## 2019-10-16 ENCOUNTER — Ambulatory Visit (INDEPENDENT_AMBULATORY_CARE_PROVIDER_SITE_OTHER): Payer: Medicare Other | Admitting: Neurology

## 2019-10-16 VITALS — BP 147/77 | HR 87 | Ht 64.0 in | Wt 285.0 lb

## 2019-10-16 DIAGNOSIS — G8929 Other chronic pain: Secondary | ICD-10-CM | POA: Diagnosis not present

## 2019-10-16 DIAGNOSIS — G2581 Restless legs syndrome: Secondary | ICD-10-CM

## 2019-10-16 DIAGNOSIS — M545 Low back pain, unspecified: Secondary | ICD-10-CM

## 2019-10-16 DIAGNOSIS — G40909 Epilepsy, unspecified, not intractable, without status epilepticus: Secondary | ICD-10-CM

## 2019-10-16 MED ORDER — PRAMIPEXOLE DIHYDROCHLORIDE 0.75 MG PO TABS: ORAL_TABLET | ORAL | 1 refills | Status: DC

## 2019-10-16 MED ORDER — CLONAZEPAM 0.5 MG PO TABS: 0.5000 mg | ORAL_TABLET | Freq: Every day | ORAL | 3 refills | Status: DC

## 2019-10-16 MED ORDER — PRAMIPEXOLE DIHYDROCHLORIDE ER 1.5 MG PO TB24: 1.5000 mg | ORAL_TABLET | Freq: Every day | ORAL | 5 refills | Status: DC

## 2019-10-16 NOTE — Patient Instructions (Signed)
Adjust Mirapex, add in Mirapex 1.5 ER tablet taking around 12 or 1:00 daily, take the 0.75 mg tablet 1 in the morning, 1 before bed  Continue other medications  See you back in 6 months

## 2019-10-16 NOTE — Progress Notes (Signed)
PATIENT: [name] DOB: 07/29/1955  REASON FOR VISIT: follow up HISTORY FROM: patient  HISTORY OF PRESENT ILLNESS: Today 10/16/19  Ms. Bergeron is a 64 year old female history of bipolar disorder, chronic low back pain, restless leg syndrome, remote history of seizures and obesity. She remains on Mirapex and clonazepam for restless leg syndrome.  She is also taking carbamazepine, baclofen and gabapentin.  The addition of clonazepam has been helpful for her restless legs.  Reports increase in restless leg symptoms, bothering her more during the day.  Not sleeping well, having to get up during the night and walk.  She also has chronic back pain, baclofen and gabapentin is helpful for this.  No seizures in over 20 years.  Seems to be tolerating Mirapex, no adverse effect.  She is rather sedentary, lives with a roommate.  Carbamazepine level was 3.0 in December.  Presents today for evaluation unaccompanied.  HISTORY 02/15/2019 SS: Ms. Primo is a 64 year old female with history of bipolar disorder, chronic low back pain, restless leg syndrome, remote history of seizures and obesity.  She remains on Mirapex and clonazepam for restless leg syndrome.  She is also taking carbamazepine and gabapentin.  The addition of clonazepam has been helpful for her restless legs.  She reports that her right leg is more bothersome than the left.  Her father is now in a facility.  She lives with her roommate, she does not drive a car.  She admits she is not very active during the day.  She is also taking baclofen for muscle spasms in her legs and back.  She has not had any recent falls, but she does have issues with balance.  She has not had recent seizure. She reports her mental health has been under good control, she sees a therapist regularly. She presents today for evaluation unaccompanied.   REVIEW OF SYSTEMS: Out of a complete 14 system review of symptoms, the patient complains only of the following symptoms,  and all other reviewed systems are negative.  Restless leg symptoms  ALLERGIES: Allergies  Allergen Reactions   Belsomra [Suvorexant] Other (See Comments)    Vivid dreams   Wellbutrin [Bupropion] Other (See Comments)    Irritability and Agitation   Geodon [Ziprasidone Hcl] Other (See Comments)    Worsened Restless Leg Syndrome   Lisinopril Cough   Neupro [Rotigotine] Rash    HOME MEDICATIONS: Outpatient Medications Prior to Visit  Medication Sig Dispense Refill   ACCU-CHEK AVIVA PLUS test strip Use in the morning and after meals as needed. 100 each 5   baclofen (LIORESAL) 10 MG tablet Take 1 tablet (10 mg total) by mouth 3 (three) times daily. 270 each 1   carbamazepine (TEGRETOL) 200 MG tablet Take 0.5 tablets (100 mg total) by mouth daily. 45 tablet 1   cetirizine (ZYRTEC) 10 MG tablet Take 10 mg by mouth daily.     DULoxetine (CYMBALTA) 30 MG capsule Take 1 capsule (30 mg total) by mouth daily. To be combined with 60 mg 90 capsule 1   DULoxetine (CYMBALTA) 60 MG capsule Take 1 capsule (60 mg total) by mouth daily. 90 capsule 1   estradiol (ESTRACE) 0.5 MG tablet Take 1 tablet (0.5 mg total) by mouth daily. 30 tablet 11   gabapentin (NEURONTIN) 800 MG tablet Take 1 tablet (800 mg total) by mouth 3 (three) times daily. 270 tablet 1   losartan-hydrochlorothiazide (HYZAAR) 100-25 MG tablet Take 1 tablet by mouth daily. 90 tablet 0   meloxicam (  MOBIC) 15 MG tablet Take 15 mg daily by mouth.      sitaGLIPtin (JANUVIA) 100 MG tablet Take 1 tablet (100 mg total) by mouth daily. 90 tablet 1   clonazePAM (KLONOPIN) 0.5 MG tablet Take 1 tablet (0.5 mg total) by mouth at bedtime. 30 tablet 3   pramipexole (MIRAPEX) 0.75 MG tablet Take 1 in the morning, 2 in the evening, may take an extra at bedtime if needed 360 tablet 1   glimepiride (AMARYL) 4 MG tablet Take 1 tablet (4 mg total) by mouth daily with breakfast. 90 tablet 1   rosuvastatin (CRESTOR) 40 MG tablet Take 1  tablet (40 mg total) by mouth daily. 90 tablet 3   triamcinolone (KENALOG) 0.025 % ointment Apply 1 application topically 2 (two) times daily. 30 g 0   No facility-administered medications prior to visit.    PAST MEDICAL HISTORY: Past Medical History:  Diagnosis Date   Anxiety    Arthritis    Bipolar disorder ([place])    chronic low back pain    Chronic low back pain 02/18/2014   Depression    Diabetes mellitus without complication (HCC)    Diabetes mellitus, type II ([place])    Fibroids    GERD (gastroesophageal reflux disease)    Gout    Hyperlipidemia    Hypertension    Obesity     PAST SURGICAL HISTORY: Past Surgical History:  Procedure Laterality Date   ABDOMINAL HYSTERECTOMY     CHOLECYSTECTOMY     FOOT SURGERY Left    KNEE ARTHROPLASTY     OVARIAN CYST REMOVAL     SPINE SURGERY      FAMILY HISTORY: Family History  Problem Relation Age of Onset   Diabetes Mother    Cancer Mother        breast   Heart disease Mother    Mental illness Mother        bipolar disorder   Stroke Mother    Breast cancer Mother    Hypertension Mother    High Cholesterol Mother    Diabetes Father    Hypertension Father    Diabetes Brother    Bipolar disorder Brother    Diabetes Brother    Breast cancer Maternal Aunt    Breast cancer Maternal Grandmother     SOCIAL HISTORY: Social History   Socioeconomic History   Marital status: Divorced    Spouse name: Not on file   Number of children: 0   Years of education: 10   Highest education level: 10th grade  Occupational History    Employer: UNEMPLOYED  Tobacco Use   Smoking status: Passive Smoke Exposure - Never Smoker   Smokeless tobacco: Never Used   Tobacco comment: room mate smokes  Scientific laboratory technician Use: Never used  Substance and Sexual Activity   Alcohol use: No    Alcohol/week: 0.0 standard drinks   Drug use: No   Sexual activity: Not Currently  Other Topics  Concern   Not on file  Social History Narrative   Patient is divorced and lives with a roommate.   Patient is on disability.   Patient has a 10th grade education.   Patient is right-handed.   Patient drinks 6 sodas daily.         Social Determinants of Health   Financial Resource Strain:    Difficulty of Paying Living Expenses:   Food Insecurity:    Worried About Charity fundraiser in the Last Year:  Ran Out of Food in the Last Year:   Transportation Needs:    Film/video editor (Medical):    Lack of Transportation (Non-Medical):   Physical Activity:    Days of Exercise per Week:    Minutes of Exercise per Session:   Stress:    Feeling of Stress :   Social Connections:    Frequency of Communication with Friends and Family:    Frequency of Social Gatherings with Friends and Family:    Attends Religious Services:    Active Member of Clubs or Organizations:    Attends Archivist Meetings:    Marital Status:   Intimate Partner Violence:    Fear of Current or Ex-Partner:    Emotionally Abused:    Physically Abused:    Sexually Abused:    PHYSICAL EXAM  Vitals:   10/16/19 1044  BP: (!) 147/77  Pulse: 87  Weight: 285 lb (129.3 kg)  Height: 5\' 4"  (1.626 m)   Body mass index is 48.92 kg/m.  Generalized: Well developed, in no acute distress, obese Neurological examination  Mentation: Alert oriented to time, place, history taking. Follows all commands speech and language fluent Cranial nerve II-XII: Pupils were equal round reactive to light. Extraocular movements were full, visual field were full on confrontational test. Facial sensation and strength were normal. Head turning and shoulder shrug  were normal and symmetric. Motor: The motor testing reveals 5 over 5 strength of all 4 extremities. Good symmetric motor tone is noted throughout.  Sensory: Sensory testing is intact to soft touch on all 4 extremities. No evidence of extinction  is noted.  Coordination: Cerebellar testing reveals good finger-nose-finger and heel-to-shin bilaterally.  Gait and station: Gait is wide-based, but steady, no assistive device Reflexes: Deep tendon reflexes are symmetric but decreased throughout  DIAGNOSTIC DATA (LABS, IMAGING, TESTING) - I reviewed patient records, labs, notes, testing and imaging myself where available.  Lab Results  Component Value Date   WBC 7.9 10/16/2018   HGB 13.8 10/16/2018   HCT 41.2 10/16/2018   MCV 85 10/16/2018   PLT 230 10/16/2018      Component Value Date/Time   NA 137 08/08/2019 1138   K 3.4 (L) 08/08/2019 1138   CL 94 (L) 08/08/2019 1138   CO2 27 08/08/2019 1138   GLUCOSE 223 (H) 08/08/2019 1138   GLUCOSE 187 (H) 11/14/2017 0958   GLUCOSE 126 (H) 03/25/2014 0811   BUN 15 08/08/2019 1138   CREATININE 0.63 08/08/2019 1138   CALCIUM 9.7 08/08/2019 1138   PROT 6.8 08/08/2019 1138   ALBUMIN 4.4 08/08/2019 1138   AST 26 08/08/2019 1138   ALT 27 08/08/2019 1138   ALKPHOS 132 (H) 08/08/2019 1138   BILITOT <0.2 08/08/2019 1138   GFRNONAA 97 08/08/2019 1138   GFRAA 112 08/08/2019 1138   Lab Results  Component Value Date   CHOL 185 08/08/2019   HDL 65 08/08/2019   LDLCALC 89 08/08/2019   TRIG 186 (H) 08/08/2019   CHOLHDL 2.8 08/08/2019   Lab Results  Component Value Date   HGBA1C 8.6 (A) 08/08/2019   Lab Results  Component Value Date   VITAMINB12 294 04/12/2017   Lab Results  Component Value Date   TSH 2.030 07/13/2018   ASSESSMENT AND PLAN 64 y.o. year old female  has a past medical history of Anxiety, Arthritis, Bipolar disorder ([place]), chronic low back pain, Chronic low back pain (02/18/2014), Depression, Diabetes mellitus without complication ([place]), Diabetes mellitus, type II ([place]), Fibroids,  GERD (gastroesophageal reflux disease), Gout, Hyperlipidemia, Hypertension, and Obesity. here with:  1.  Chronic low back pain 2.  Restless leg syndrome 3.  Remote history of seizures 4.   Possible small fiber neuropathy  She has had an increase in her restless leg symptoms.  I will adjust her Mirapex, add in Mirapex ER 1.5 mg tablet to be taken midday, can continue 0.75 IR Mirapex twice daily.  She will remain on gabapentin, carbamazepine, Klonopin, and baclofen.  A refill was sent in for Klonopin, which she takes for restless legs.  She will follow-up in 6 months or sooner if needed.  I spent 30 minutes of face-to-face and non-face-to-face time with patient.  This included previsit chart review, lab review, study review, order entry, electronic health record documentation, patient education.  Butler Denmark, AGNP-C, DNP 10/16/2019, 11:19 AM [facility] [address] [place], [place] [address] [phone]

## 2019-10-16 NOTE — Progress Notes (Signed)
I have read the note, and I agree with the clinical assessment and plan.  Burris Matherne K Zalea Pete   

## 2019-10-17 ENCOUNTER — Other Ambulatory Visit: Payer: Medicare Other

## 2019-10-22 ENCOUNTER — Telehealth: Payer: Self-pay | Admitting: *Deleted

## 2019-10-22 ENCOUNTER — Ambulatory Visit
Admission: RE | Admit: 2019-10-22 | Discharge: 2019-10-22 | Disposition: A | Discharge status: Home / Self Care | Payer: Medicare Other | Source: Ambulatory Visit | Attending: Physical Medicine and Rehabilitation | Admitting: Physical Medicine and Rehabilitation

## 2019-10-22 ENCOUNTER — Other Ambulatory Visit: Payer: Self-pay

## 2019-10-22 DIAGNOSIS — M50121 Cervical disc disorder at C4-C5 level with radiculopathy: Secondary | ICD-10-CM | POA: Diagnosis not present

## 2019-10-22 DIAGNOSIS — M4802 Spinal stenosis, cervical region: Secondary | ICD-10-CM

## 2019-10-22 DIAGNOSIS — M50123 Cervical disc disorder at C6-C7 level with radiculopathy: Secondary | ICD-10-CM | POA: Diagnosis not present

## 2019-10-22 DIAGNOSIS — M50122 Cervical disc disorder at C5-C6 level with radiculopathy: Secondary | ICD-10-CM | POA: Diagnosis not present

## 2019-10-22 MED ORDER — IOPAMIDOL (ISOVUE-M 300) INJECTION 61%
1.0000 mL | Freq: Once | INTRAMUSCULAR | Status: AC | PRN
  Administered 2019-10-22: 1 mL via EPIDURAL

## 2019-10-22 MED ORDER — TRIAMCINOLONE ACETONIDE 40 MG/ML IJ SUSP (RADIOLOGY)
60.0000 mg | Freq: Once | INTRAMUSCULAR | Status: AC
  Administered 2019-10-22: 60 mg via EPIDURAL

## 2019-10-22 NOTE — Telephone Encounter (Signed)
Pramipexole PA, key: [id number]. Received : This medication or product is on your plan's list of covered drugs. Prior authorization is not required at this time. If your pharmacy has questions regarding the processing of your prescription, please have them call the [facility] pharmacy help desk at [phone]. Faxed to pharmacy with [facility] help desk #.

## 2019-10-22 NOTE — Discharge Instructions (Signed)

## 2019-10-23 ENCOUNTER — Telehealth: Payer: Self-pay | Admitting: *Deleted

## 2019-10-23 MED ORDER — PRAMIPEXOLE DIHYDROCHLORIDE 0.75 MG PO TABS: ORAL_TABLET | ORAL | 1 refills | Status: DC

## 2019-10-23 NOTE — Addendum Note (Signed)
Addended by: Kathrynn Ducking on: 10/23/2019 05:37 PM   Modules accepted: Orders

## 2019-10-23 NOTE — Telephone Encounter (Signed)
I called the patient.  I left a message.  I will try to call back later.  If she needs coverage for her restless leg syndrome during the day, the Mirapex can be given 3 times daily, do not have to use the extended release preparation.  I called the patient back, she does have some problems with restless legs at midday, we can use Mirapex 0.75 mg 3 times a day if needed, otherwise use it only twice daily.  I will send in a new prescription.  The patient does not wish to see the nurse practitioner, I will get that appointment canceled, and set her up to see me in 6 months.

## 2019-10-23 NOTE — Telephone Encounter (Signed)
Received call from [facility] who stated PA wasn't done on Mirapex ER.  They still can't fill Rx, need new PA done.  Key: [id number] Your information has been sent to [facility]. Mirapex ER 1.5 mg denied by insurance: It is  not FDA approved for restless leg syndrome.

## 2019-10-24 NOTE — Telephone Encounter (Signed)
Called patient and LVM requesting she call back and schedule a 6 month FU with Dr Jannifer Franklin, Feb 2022.

## 2019-10-31 ENCOUNTER — Ambulatory Visit
Admission: RE | Admit: 2019-10-31 | Discharge: 2019-10-31 | Disposition: A | Discharge status: Home / Self Care | Payer: Medicare Other | Source: Ambulatory Visit | Attending: Emergency Medicine | Admitting: Emergency Medicine

## 2019-10-31 ENCOUNTER — Other Ambulatory Visit: Payer: Self-pay | Admitting: Emergency Medicine

## 2019-10-31 ENCOUNTER — Other Ambulatory Visit: Payer: Self-pay

## 2019-10-31 ENCOUNTER — Ambulatory Visit: Admission: RE | Admit: 2019-10-31 | Payer: Medicare Other | Source: Ambulatory Visit

## 2019-10-31 DIAGNOSIS — N6452 Nipple discharge: Secondary | ICD-10-CM | POA: Diagnosis not present

## 2019-10-31 DIAGNOSIS — N644 Mastodynia: Secondary | ICD-10-CM

## 2019-10-31 DIAGNOSIS — R928 Other abnormal and inconclusive findings on diagnostic imaging of breast: Secondary | ICD-10-CM | POA: Diagnosis not present

## 2019-10-31 DIAGNOSIS — N6310 Unspecified lump in the right breast, unspecified quadrant: Secondary | ICD-10-CM

## 2019-11-07 ENCOUNTER — Ambulatory Visit (INDEPENDENT_AMBULATORY_CARE_PROVIDER_SITE_OTHER): Payer: Medicare Other | Admitting: Emergency Medicine

## 2019-11-07 ENCOUNTER — Other Ambulatory Visit: Payer: Self-pay

## 2019-11-07 ENCOUNTER — Encounter: Payer: Self-pay | Admitting: Emergency Medicine

## 2019-11-07 VITALS — BP 114/77 | HR 86 | Temp 97.0°F | Resp 16 | Ht 64.5 in | Wt 278.0 lb

## 2019-11-07 DIAGNOSIS — E1169 Type 2 diabetes mellitus with other specified complication: Secondary | ICD-10-CM | POA: Diagnosis not present

## 2019-11-07 DIAGNOSIS — G894 Chronic pain syndrome: Secondary | ICD-10-CM

## 2019-11-07 DIAGNOSIS — E1165 Type 2 diabetes mellitus with hyperglycemia: Secondary | ICD-10-CM | POA: Diagnosis not present

## 2019-11-07 DIAGNOSIS — F3160 Bipolar disorder, current episode mixed, unspecified: Secondary | ICD-10-CM

## 2019-11-07 DIAGNOSIS — I152 Hypertension secondary to endocrine disorders: Secondary | ICD-10-CM

## 2019-11-07 DIAGNOSIS — I10 Essential (primary) hypertension: Secondary | ICD-10-CM

## 2019-11-07 DIAGNOSIS — E1159 Type 2 diabetes mellitus with other circulatory complications: Secondary | ICD-10-CM | POA: Diagnosis not present

## 2019-11-07 DIAGNOSIS — E785 Hyperlipidemia, unspecified: Secondary | ICD-10-CM

## 2019-11-07 DIAGNOSIS — Z23 Encounter for immunization: Secondary | ICD-10-CM

## 2019-11-07 DIAGNOSIS — F411 Generalized anxiety disorder: Secondary | ICD-10-CM

## 2019-11-07 LAB — COMPREHENSIVE METABOLIC PANEL
ALT: 22 IU/L (ref 0–32)
AST: 16 IU/L (ref 0–40)
Albumin/Globulin Ratio: 1.7 (ref 1.2–2.2)
Albumin: 4.1 g/dL (ref 3.8–4.8)
Alkaline Phosphatase: 115 IU/L (ref 48–121)
BUN/Creatinine Ratio: 21 (ref 12–28)
BUN: 16 mg/dL (ref 8–27)
Bilirubin Total: 0.2 mg/dL (ref 0.0–1.2)
CO2: 29 mmol/L (ref 20–29)
Calcium: 9.8 mg/dL (ref 8.7–10.3)
Chloride: 94 mmol/L — ABNORMAL LOW (ref 96–106)
Creatinine, Ser: 0.77 mg/dL (ref 0.57–1.00)
GFR calc Af Amer: 96 mL/min/{1.73_m2} (ref >59)
GFR calc non Af Amer: 83 mL/min/{1.73_m2} (ref >59)
Globulin, Total: 2.4 g/dL (ref 1.5–4.5)
Glucose: 219 mg/dL — ABNORMAL HIGH (ref 65–99)
Potassium: 3.1 mmol/L — ABNORMAL LOW (ref 3.5–5.2)
Sodium: 138 mmol/L (ref 134–144)
Total Protein: 6.5 g/dL (ref 6.0–8.5)

## 2019-11-07 LAB — LIPID PANEL
Chol/HDL Ratio: 2.8 ratio (ref 0.0–4.4)
Cholesterol, Total: 144 mg/dL (ref 100–199)
HDL: 52 mg/dL (ref >39)
LDL Chol Calc (NIH): 64 mg/dL (ref 0–99)
Triglycerides: 165 mg/dL — ABNORMAL HIGH (ref 0–149)
VLDL Cholesterol Cal: 28 mg/dL (ref 5–40)

## 2019-11-07 LAB — GLUCOSE, POCT (MANUAL RESULT ENTRY): POC Glucose: 218 mg/dl — AB (ref 70–99)

## 2019-11-07 LAB — POCT GLYCOSYLATED HEMOGLOBIN (HGB A1C): Hemoglobin A1C: 10.7 % — AB (ref 4.0–5.6)

## 2019-11-07 MED ORDER — DAPAGLIFLOZIN PROPANEDIOL 5 MG PO TABS: 5.0000 mg | ORAL_TABLET | Freq: Every day | ORAL | 3 refills | Status: AC

## 2019-11-07 NOTE — Assessment & Plan Note (Signed)
Well-controlled hypertension.  Continue present medication.  No changes. Uncontrolled diabetes with hemoglobin A1c higher than before at 10.7.  We will continue glimepiride 4 mg daily and Januvia 100 mg daily and start Farxiga 5 mg daily or equivalent as per patient's insurance.  Diet and nutrition discussed. Follow-up in 3 months.

## 2019-11-07 NOTE — Patient Instructions (Addendum)
   If you have lab work done today you will be contacted with your lab results within the next 2 weeks.  If you have not heard from us then please contact us. The fastest way to get your results is to register for My Chart.   IF you received an x-ray today, you will receive an invoice from [facility]. Please contact  Radiology at [phone] with questions or concerns regarding your invoice.   IF you received labwork today, you will receive an invoice from [facility]. Please contact [facility] at [phone] with questions or concerns regarding your invoice.   Our billing staff will not be able to assist you with questions regarding bills from these companies.  You will be contacted with the lab results as soon as they are available. The fastest way to get your results is to activate your My Chart account. Instructions are located on the last page of this paperwork. If you have not heard from us regarding the results in 2 weeks, please contact this office.     Diabetes Mellitus and Nutrition, Adult When you have diabetes (diabetes mellitus), it is very important to have healthy eating habits because your blood sugar (glucose) levels are greatly affected by what you eat and drink. Eating healthy foods in the appropriate amounts, at about the same times every day, can help you:  Control your blood glucose.  Lower your risk of heart disease.  Improve your blood pressure.  Reach or maintain a healthy weight. Every person with diabetes is different, and each person has different needs for a meal plan. Your health care provider may recommend that you work with a diet and nutrition specialist (dietitian) to make a meal plan that is best for you. Your meal plan may vary depending on factors such as:  The calories you need.  The medicines you take.  Your weight.  Your blood glucose, blood pressure, and cholesterol levels.  Your activity level.  Other health conditions  you have, such as heart or kidney disease. How do carbohydrates affect me? Carbohydrates, also called carbs, affect your blood glucose level more than any other type of food. Eating carbs naturally raises the amount of glucose in your blood. Carb counting is a method for keeping track of how many carbs you eat. Counting carbs is important to keep your blood glucose at a healthy level, especially if you use insulin or take certain oral diabetes medicines. It is important to know how many carbs you can safely have in each meal. This is different for every person. Your dietitian can help you calculate how many carbs you should have at each meal and for each snack. Foods that contain carbs include:  Bread, cereal, rice, pasta, and crackers.  Potatoes and corn.  Peas, beans, and lentils.  Milk and yogurt.  Fruit and juice.  Desserts, such as cakes, cookies, ice cream, and candy. How does alcohol affect me? Alcohol can cause a sudden decrease in blood glucose (hypoglycemia), especially if you use insulin or take certain oral diabetes medicines. Hypoglycemia can be a life-threatening condition. Symptoms of hypoglycemia (sleepiness, dizziness, and confusion) are similar to symptoms of having too much alcohol. If your health care provider says that alcohol is safe for you, follow these guidelines:  Limit alcohol intake to no more than 1 drink per day for nonpregnant women and 2 drinks per day for men. One drink equals 12 oz of beer, 5 oz of wine, or 1 oz of hard liquor.    Do not drink on an empty stomach.  Keep yourself hydrated with water, diet soda, or unsweetened iced tea.  Keep in mind that regular soda, juice, and other mixers may contain a lot of sugar and must be counted as carbs. What are tips for following this plan?  Reading food labels  Start by checking the serving size on the "Nutrition Facts" label of packaged foods and drinks. The amount of calories, carbs, fats, and other  nutrients listed on the label is based on one serving of the item. Many items contain more than one serving per package.  Check the total grams (g) of carbs in one serving. You can calculate the number of servings of carbs in one serving by dividing the total carbs by 15. For example, if a food has 30 g of total carbs, it would be equal to 2 servings of carbs.  Check the number of grams (g) of saturated and trans fats in one serving. Choose foods that have low or no amount of these fats.  Check the number of milligrams (mg) of salt (sodium) in one serving. Most people should limit total sodium intake to less than 2,300 mg per day.  Always check the nutrition information of foods labeled as "low-fat" or "nonfat". These foods may be higher in added sugar or refined carbs and should be avoided.  Talk to your dietitian to identify your daily goals for nutrients listed on the label. Shopping  Avoid buying canned, premade, or processed foods. These foods tend to be high in fat, sodium, and added sugar.  Shop around the outside edge of the grocery store. This includes fresh fruits and vegetables, bulk grains, fresh meats, and fresh dairy. Cooking  Use low-heat cooking methods, such as baking, instead of high-heat cooking methods like deep frying.  Cook using healthy oils, such as olive, canola, or sunflower oil.  Avoid cooking with butter, cream, or high-fat meats. Meal planning  Eat meals and snacks regularly, preferably at the same times every day. Avoid going long periods of time without eating.  Eat foods high in fiber, such as fresh fruits, vegetables, beans, and whole grains. Talk to your dietitian about how many servings of carbs you can eat at each meal.  Eat 4-6 ounces (oz) of lean protein each day, such as lean meat, chicken, fish, eggs, or tofu. One oz of lean protein is equal to: ? 1 oz of meat, chicken, or fish. ? 1 egg. ?  cup of tofu.  Eat some foods each day that contain  healthy fats, such as avocado, nuts, seeds, and fish. Lifestyle  Check your blood glucose regularly.  Exercise regularly as told by your health care provider. This may include: ? 150 minutes of moderate-intensity or vigorous-intensity exercise each week. This could be brisk walking, biking, or water aerobics. ? Stretching and doing strength exercises, such as yoga or weightlifting, at least 2 times a week.  Take medicines as told by your health care provider.  Do not use any products that contain nicotine or tobacco, such as cigarettes and e-cigarettes. If you need help quitting, ask your health care provider.  Work with a counselor or diabetes educator to identify strategies to manage stress and any emotional and social challenges. Questions to ask a health care provider  Do I need to meet with a diabetes educator?  Do I need to meet with a dietitian?  What number can I call if I have questions?  When are the best times to   times to check my blood glucose? Where to find more information:  American Diabetes Association: diabetes.org  Academy of Nutrition and Dietetics: www.eatright.org  National Institute of Diabetes and Digestive and Kidney Diseases (NIH): www.niddk.nih.gov Summary  A healthy meal plan will help you control your blood glucose and maintain a healthy lifestyle.  Working with a diet and nutrition specialist (dietitian) can help you make a meal plan that is best for you.  Keep in mind that carbohydrates (carbs) and alcohol have immediate effects on your blood glucose levels. It is important to count carbs and to use alcohol carefully. This information is not intended to replace advice given to you by your health care provider. Make sure you discuss any questions you have with your health care provider. Document Revised: 02/04/2017 Document Reviewed: 03/29/2016 Elsevier Patient Education  2020 [employer].  

## 2019-11-07 NOTE — Progress Notes (Signed)
Rachel Luna 64 y.o.   Chief Complaint  Patient presents with  . Diabetes    follow up 3 month   ASSESSMENT & PLAN: Hypertension associated with diabetes ([place]) Well-controlled hypertension.  Continue Hyzaar 100-25 mg daily.  No changes. Uncontrolled diabetes with hemoglobin A1c higher than before at 8.6.  Patient has been off [place] for 2 weeks.  Continue glimepiride 4 mg daily and increase Januvia to 100 mg daily.  Diet and nutrition discussed.  Follow-up in 3 months. HISTORY OF PRESENT ILLNESS: This is a 64 y.o. female with history of diabetes here for 66-month follow-up. Presently taking glimepiride 4 mg daily and Januvia 100 mg daily.  Intolerant to Metformin. No other complaints or medical concerns today. Lab Results  Component Value Date   HGBA1C 10.7 (A) 11/07/2019   BP Readings from Last 3 Encounters:  11/07/19 114/77  10/22/19 (!) 147/72  10/16/19 (!) 147/77    HPI   Prior to Admission medications   Medication Sig Start Date End Date Taking? Authorizing Provider  baclofen (LIORESAL) 10 MG tablet Take 1 tablet (10 mg total) by mouth 3 (three) times daily. 08/28/19  Yes Suzzanne Cloud, NP  carbamazepine (TEGRETOL) 200 MG tablet Take 0.5 tablets (100 mg total) by mouth daily. 08/28/19  Yes Suzzanne Cloud, NP  cetirizine (ZYRTEC) 10 MG tablet Take 10 mg by mouth daily.   Yes [provider]  clonazePAM (KLONOPIN) 0.5 MG tablet Take 1 tablet (0.5 mg total) by mouth at bedtime. 10/16/19  Yes Suzzanne Cloud, NP  DULoxetine (CYMBALTA) 30 MG capsule Take 1 capsule (30 mg total) by mouth daily. To be combined with 60 mg 04/24/19  Yes Eappen, Ria Clock, MD  DULoxetine (CYMBALTA) 60 MG capsule Take 1 capsule (60 mg total) by mouth daily. 04/24/19  Yes Ursula Alert, MD  estradiol (ESTRACE) 0.5 MG tablet Take 1 tablet (0.5 mg total) by mouth daily. 12/26/18  Yes Shelly Bombard, MD  gabapentin (NEURONTIN) 800 MG tablet Take 1 tablet (800 mg total) by mouth 3 (three) times  daily. 04/18/19  Yes Stein Windhorst, Ines Bloomer, MD  losartan-hydrochlorothiazide (HYZAAR) 100-25 MG tablet Take 1 tablet by mouth daily. 08/28/19  Yes Benaiah Behan, Ines Bloomer, MD  meloxicam (MOBIC) 15 MG tablet Take 15 mg daily by mouth.  07/04/15  Yes [provider]  pramipexole (MIRAPEX) 0.75 MG tablet One tablet three times a day 10/23/19  Yes Kathrynn Ducking, MD  ACCU-CHEK AVIVA PLUS test strip Use in the morning and after meals as needed. 07/13/18   Horald Pollen, MD  glimepiride (AMARYL) 4 MG tablet Take 1 tablet (4 mg total) by mouth daily with breakfast. 04/18/19 08/08/19  Horald Pollen, MD  rosuvastatin (CRESTOR) 40 MG tablet Take 1 tablet (40 mg total) by mouth daily. 04/18/19 08/08/19  Horald Pollen, MD  sitaGLIPtin (JANUVIA) 100 MG tablet Take 1 tablet (100 mg total) by mouth daily. 08/08/19 11/06/19  Horald Pollen, MD    Allergies  Allergen Reactions  . Belsomra [Suvorexant] Other (See Comments)    Vivid dreams  . Wellbutrin [Bupropion] Other (See Comments)    Irritability and Agitation  . Geodon [Ziprasidone Hcl] Other (See Comments)    Worsened Restless Leg Syndrome  . Lisinopril Cough  . Neupro [Rotigotine] Rash    Patient Active Problem List   Diagnosis Date Noted  . Restless leg syndrome 02/15/2019  . Bipolar I disorder, most recent episode mixed ([place]) 09/14/2018  . GAD (generalized anxiety disorder)  09/14/2018  . 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 Dyslipidemia associated with type 2 diabetes mellitus ([place]) 07/13/2018  . Seizure disorder ([place]) 10/19/2017  . 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 ANA positive 03/05/2015  . Elevated rheumatoid factor 03/05/2015  . Hypertension associated with diabetes ([place]) 09/04/2014  . Avitaminosis D 09/04/2014  . Chronic low back pain 02/18/2014  . Menopausal hot flushes 08/14/2012  . Arthritis of knee, degenerative 04/06/2011  . Benign essential HTN 07/13/2010  . Essential (primary)  hypertension 07/13/2010  . Insomnia, unspecified 07/13/2010  . Chronic pain 01/08/2010  . Affective disorder, major 11/05/2009  . Major depressive disorder 11/05/2009  . Major depressive disorder, single episode, unspecified 11/05/2009  . Allergic rhinitis 07/03/2009    Past Medical History:  Diagnosis Date  . Anxiety   . Arthritis   . Bipolar disorder ([place])   . chronic low back pain   . Chronic low back pain 02/18/2014  . Depression   . Diabetes mellitus without complication ([place])   . Diabetes mellitus, type II ([place])   . Fibroids   . GERD (gastroesophageal reflux disease)   . Gout   . Hyperlipidemia   . Hypertension   . Obesity     Past Surgical History:  Procedure Laterality Date  . ABDOMINAL HYSTERECTOMY    . CHOLECYSTECTOMY    . FOOT SURGERY Left   . KNEE ARTHROPLASTY    . OVARIAN CYST REMOVAL    . SPINE SURGERY      Social History   Socioeconomic History  . Marital status: Divorced    Spouse name: Not on file  . Number of children: 0  . Years of education: 10  . Highest education level: 10th grade  Occupational History    Employer: UNEMPLOYED  Tobacco Use  . Smoking status: Passive Smoke Exposure - Never Smoker  . Smokeless tobacco: Never Used  . Tobacco comment: room mate smokes  Vaping Use  . Vaping Use: Never used  Substance and Sexual Activity  . Alcohol use: No    Alcohol/week: 0.0 standard drinks  . Drug use: No  . Sexual activity: Not Currently  Other Topics Concern  . Not on file  Social History Narrative   Patient is divorced and lives with a roommate.   Patient is on disability.   Patient has a 10th grade education.   Patient is right-handed.   Patient drinks 6 sodas daily.         Social Determinants of Health   Financial Resource Strain:   . Difficulty of Paying Living Expenses: Not on file  Food Insecurity:   . Worried About Charity fundraiser in the Last Year: Not on file  . Ran Out of Food in the Last Year: Not on file    Transportation Needs:   . Lack of Transportation (Medical): Not on file  . Lack of Transportation (Non-Medical): Not on file  Physical Activity:   . Days of Exercise per Week: Not on file  . Minutes of Exercise per Session: Not on file  Stress:   . Feeling of Stress : Not on file  Social Connections:   . Frequency of Communication with Friends and Family: Not on file  . Frequency of Social Gatherings with Friends and Family: Not on file  . Attends Religious Services: Not on file  . Active Member of Clubs or Organizations: Not on file  . Attends Archivist Meetings: Not on file  . Marital Status:  Not on file  Intimate Partner Violence:   . Fear of Current or Ex-Partner: Not on file  . Emotionally Abused: Not on file  . Physically Abused: Not on file  . Sexually Abused: Not on file    Family History  Problem Relation Age of Onset  . Diabetes Mother   . Cancer Mother        breast  . Heart disease Mother   . Mental illness Mother        bipolar disorder  . Stroke Mother   . Breast cancer Mother   . Hypertension Mother   . High Cholesterol Mother   . Diabetes Father   . Hypertension Father   . Diabetes Brother   . Bipolar disorder Brother   . Diabetes Brother   . Breast cancer Maternal Aunt   . Breast cancer Maternal Grandmother      Review of Systems  Constitutional: Negative.  Negative for chills and fever.  HENT: Negative.  Negative for congestion and sore throat.   Respiratory: Negative.  Negative for shortness of breath.   Cardiovascular: Negative for chest pain and palpitations.  Gastrointestinal: Negative for abdominal pain, nausea and vomiting.  Genitourinary: Negative.  Negative for hematuria.  Skin: Negative.   Neurological: Negative.  Negative for dizziness and headaches.  All other systems reviewed and are negative.    Today's Vitals   11/07/19 1057  BP: 114/77  Pulse: 86  Resp: 16  Temp: (!) 97 F (36.1 C)  TempSrc: Temporal  SpO2:  94%  Weight: 278 lb (126.1 kg)  Height: 5' 4.5" (1.638 m)   Body mass index is 46.98 kg/m. Wt Readings from Last 3 Encounters:  11/07/19 278 lb (126.1 kg)  10/16/19 285 lb (129.3 kg)  08/08/19 282 lb 6.4 oz (128.1 kg)     Physical Exam Vitals reviewed.  Constitutional:      Appearance: Normal appearance. She is obese.  HENT:     Head: Normocephalic.  Eyes:     Extraocular Movements: Extraocular movements intact.     Pupils: Pupils are equal, round, and reactive to light.  Cardiovascular:     Rate and Rhythm: Normal rate and regular rhythm.     Pulses: Normal pulses.     Heart sounds: Normal heart sounds.  Pulmonary:     Effort: Pulmonary effort is normal.     Breath sounds: Normal breath sounds.  Abdominal:     Palpations: Abdomen is soft.     Tenderness: There is no abdominal tenderness.  Musculoskeletal:        General: Normal range of motion.     Cervical back: Normal range of motion and neck supple.  Skin:    General: Skin is warm and dry.     Capillary Refill: Capillary refill takes less than 2 seconds.  Neurological:     General: No focal deficit present.     Mental Status: She is alert and oriented to person, place, and time.  Psychiatric:        Mood and Affect: Mood normal.        Behavior: Behavior normal.    Results for orders placed or performed in visit on 11/07/19 (from the past 24 hour(s))  POCT glucose (manual entry)     Status: Abnormal   Collection Time: 11/07/19 11:06 AM  Result Value Ref Range   POC Glucose 218 (A) 70 - 99 mg/dl  POCT glycosylated hemoglobin (Hb A1C)     Status: Abnormal   Collection  Time: 11/07/19 11:07 AM  Result Value Ref Range   Hemoglobin A1C 10.7 (A) 4.0 - 5.6 %   HbA1c POC (<> result, manual entry)     HbA1c, POC (prediabetic range)     HbA1c, POC (controlled diabetic range)       ASSESSMENT & PLAN: Hypertension associated with diabetes ([place]) Well-controlled hypertension.  Continue present medication.  No  changes. Uncontrolled diabetes with hemoglobin A1c higher than before at 10.7.  We will continue glimepiride 4 mg daily and Januvia 100 mg daily and start Farxiga 5 mg daily or equivalent as per patient's insurance.  Diet and nutrition discussed. Follow-up in 3 months.  Tanisha was seen today for diabetes.  Diagnoses and all orders for this visit:  Type 2 diabetes mellitus with hyperglycemia, without long-term current use of insulin (HCC) -     POCT glucose (manual entry) -     POCT glycosylated hemoglobin (Hb A1C) -     Comprehensive metabolic panel -     Lipid panel -     dapagliflozin propanediol (FARXIGA) 5 MG TABS tablet; Take 1 tablet (5 mg total) by mouth daily before breakfast.  Need for prophylactic vaccination and inoculation against influenza -     Flu Vaccine QUAD 36+ mos IM  Hypertension associated with diabetes ([place])  Dyslipidemia associated with type 2 diabetes mellitus (HCC)  Chronic pain syndrome  GAD (generalized anxiety disorder)  Bipolar I disorder, most recent episode mixed [facility])    Patient Instructions       If you have lab work done today you will be contacted with your lab results within the next 2 weeks.  If you have not heard from [place] then please contact us. The fastest way to get your results is to register for My Chart.   IF you received an x-ray today, you will receive an invoice from [facility]. Please contact [facility] at [phone] with questions or concerns regarding your invoice.   IF you received labwork today, you will receive an invoice from [facility]. Please contact [facility] at [phone] with questions or concerns regarding your invoice.   Our billing staff will not be able to assist you with questions regarding bills from these companies.  You will be contacted with the lab results as soon as they are available. The fastest way to get your results is to activate your My Chart account. Instructions are located  on the last page of this paperwork. If you have not heard from [place] regarding the results in 2 weeks, please contact this office.     Diabetes Mellitus and Nutrition, Adult When you have diabetes (diabetes mellitus), it is very important to have healthy eating habits because your blood sugar (glucose) levels are greatly affected by what you eat and drink. Eating healthy foods in the appropriate amounts, at about the same times every day, can help you:  Control your blood glucose.  Lower your risk of heart disease.  Improve your blood pressure.  Reach or maintain a healthy weight. Every person with diabetes is different, and each person has different needs for a meal plan. Your health care provider may recommend that you work with a diet and nutrition specialist (dietitian) to make a meal plan that is best for you. Your meal plan may vary depending on factors such as:  The calories you need.  The medicines you take.  Your weight.  Your blood glucose, blood pressure, and cholesterol levels.  Your activity level.  Other health conditions  you have, such as heart or kidney disease. How do carbohydrates affect me? Carbohydrates, also called carbs, affect your blood glucose level more than any other type of food. Eating carbs naturally raises the amount of glucose in your blood. Carb counting is a method for keeping track of how many carbs you eat. Counting carbs is important to keep your blood glucose at a healthy level, especially if you use insulin or take certain oral diabetes medicines. It is important to know how many carbs you can safely have in each meal. This is different for every person. Your dietitian can help you calculate how many carbs you should have at each meal and for each snack. Foods that contain carbs include:  Bread, cereal, rice, pasta, and crackers.  Potatoes and corn.  Peas, beans, and lentils.  Milk and yogurt.  Fruit and juice.  Desserts, such as cakes,  cookies, ice cream, and candy. How does alcohol affect me? Alcohol can cause a sudden decrease in blood glucose (hypoglycemia), especially if you use insulin or take certain oral diabetes medicines. Hypoglycemia can be a life-threatening condition. Symptoms of hypoglycemia (sleepiness, dizziness, and confusion) are similar to symptoms of having too much alcohol. If your health care provider says that alcohol is safe for you, follow these guidelines:  Limit alcohol intake to no more than 1 drink per day for nonpregnant women and 2 drinks per day for men. One drink equals 12 oz of beer, 5 oz of wine, or 1 oz of hard liquor.  Do not drink on an empty stomach.  Keep yourself hydrated with water, diet soda, or unsweetened iced tea.  Keep in mind that regular soda, juice, and other mixers may contain a lot of sugar and must be counted as carbs. What are tips for following this plan?  Reading food labels  Start by checking the serving size on the "Nutrition Facts" label of packaged foods and drinks. The amount of calories, carbs, fats, and other nutrients listed on the label is based on one serving of the item. Many items contain more than one serving per package.  Check the total grams (g) of carbs in one serving. You can calculate the number of servings of carbs in one serving by dividing the total carbs by 15. For example, if a food has 30 g of total carbs, it would be equal to 2 servings of carbs.  Check the number of grams (g) of saturated and trans fats in one serving. Choose foods that have low or no amount of these fats.  Check the number of milligrams (mg) of salt (sodium) in one serving. Most people should limit total sodium intake to less than 2,300 mg per day.  Always check the nutrition information of foods labeled as "low-fat" or "nonfat". These foods may be higher in added sugar or refined carbs and should be avoided.  Talk to your dietitian to identify your daily goals for  nutrients listed on the label. Shopping  Avoid buying canned, premade, or processed foods. These foods tend to be high in fat, sodium, and added sugar.  Shop around the outside edge of the grocery store. This includes fresh fruits and vegetables, bulk grains, fresh meats, and fresh dairy. Cooking  Use low-heat cooking methods, such as baking, instead of high-heat cooking methods like deep frying.  Cook using healthy oils, such as olive, canola, or sunflower oil.  Avoid cooking with butter, cream, or high-fat meats. Meal planning  Eat meals and snacks regularly, preferably  at the same times every day. Avoid going long periods of time without eating.  Eat foods high in fiber, such as fresh fruits, vegetables, beans, and whole grains. Talk to your dietitian about how many servings of carbs you can eat at each meal.  Eat 4-6 ounces (oz) of lean protein each day, such as lean meat, chicken, fish, eggs, or tofu. One oz of lean protein is equal to: ? 1 oz of meat, chicken, or fish. ? 1 egg. ?  cup of tofu.  Eat some foods each day that contain healthy fats, such as avocado, nuts, seeds, and fish. Lifestyle  Check your blood glucose regularly.  Exercise regularly as told by your health care provider. This may include: ? 150 minutes of moderate-intensity or vigorous-intensity exercise each week. This could be brisk walking, biking, or water aerobics. ? Stretching and doing strength exercises, such as yoga or weightlifting, at least 2 times a week.  Take medicines as told by your health care provider.  Do not use any products that contain nicotine or tobacco, such as cigarettes and e-cigarettes. If you need help quitting, ask your health care provider.  Work with a Social worker or diabetes educator to identify strategies to manage stress and any emotional and social challenges. Questions to ask a health care provider  Do I need to meet with a diabetes educator?  Do I need to meet with a  dietitian?  What number can I call if I have questions?  When are the best times to check my blood glucose? Where to find more information:  American Diabetes Association: diabetes.org  Academy of Nutrition and Dietetics: www.eatright.[employer] of Diabetes and Digestive and Kidney Diseases (NIH): [employer] Summary  A healthy meal plan will help you control your blood glucose and maintain a healthy lifestyle.  Working with a diet and nutrition specialist (dietitian) can help you make a meal plan that is best for you.  Keep in mind that carbohydrates (carbs) and alcohol have immediate effects on your blood glucose levels. It is important to count carbs and to use alcohol carefully. This information is not intended to replace advice given to you by your health care provider. Make sure you discuss any questions you have with your health care provider. Document Revised: 02/04/2017 Document Reviewed: 03/29/2016 Elsevier Patient Education  2020 Elsevier Inc.      Agustina Caroli, MD [facility]

## 2019-11-08 ENCOUNTER — Other Ambulatory Visit: Payer: Self-pay | Admitting: Emergency Medicine

## 2019-11-08 DIAGNOSIS — E876 Hypokalemia: Secondary | ICD-10-CM

## 2019-11-13 ENCOUNTER — Encounter: Payer: Self-pay | Admitting: Emergency Medicine

## 2019-11-13 ENCOUNTER — Emergency Department: Payer: Medicare Other

## 2019-11-13 ENCOUNTER — Other Ambulatory Visit: Payer: Self-pay

## 2019-11-13 ENCOUNTER — Emergency Department
Admission: EM | Admit: 2019-11-13 | Discharge: 2019-11-13 | Disposition: A | Discharge status: Home / Self Care | Payer: Medicare Other | Attending: Emergency Medicine | Admitting: Emergency Medicine

## 2019-11-13 DIAGNOSIS — I10 Essential (primary) hypertension: Secondary | ICD-10-CM | POA: Diagnosis not present

## 2019-11-13 DIAGNOSIS — N3001 Acute cystitis with hematuria: Secondary | ICD-10-CM | POA: Diagnosis not present

## 2019-11-13 DIAGNOSIS — E119 Type 2 diabetes mellitus without complications: Secondary | ICD-10-CM | POA: Diagnosis not present

## 2019-11-13 DIAGNOSIS — Z79899 Other long term (current) drug therapy: Secondary | ICD-10-CM | POA: Diagnosis not present

## 2019-11-13 DIAGNOSIS — Z7984 Long term (current) use of oral hypoglycemic drugs: Secondary | ICD-10-CM | POA: Insufficient documentation

## 2019-11-13 DIAGNOSIS — R300 Dysuria: Secondary | ICD-10-CM

## 2019-11-13 DIAGNOSIS — Z9049 Acquired absence of other specified parts of digestive tract: Secondary | ICD-10-CM | POA: Diagnosis not present

## 2019-11-13 DIAGNOSIS — R109 Unspecified abdominal pain: Secondary | ICD-10-CM

## 2019-11-13 DIAGNOSIS — D3500 Benign neoplasm of unspecified adrenal gland: Secondary | ICD-10-CM | POA: Diagnosis not present

## 2019-11-13 DIAGNOSIS — N281 Cyst of kidney, acquired: Secondary | ICD-10-CM | POA: Diagnosis not present

## 2019-11-13 DIAGNOSIS — Z7722 Contact with and (suspected) exposure to environmental tobacco smoke (acute) (chronic): Secondary | ICD-10-CM | POA: Insufficient documentation

## 2019-11-13 LAB — URINALYSIS, COMPLETE (UACMP) WITH MICROSCOPIC
Bilirubin Urine: NEGATIVE
Glucose, UA: >=500 mg/dL — AB
Ketones, ur: NEGATIVE mg/dL
Nitrite: NEGATIVE
Protein, ur: 30 mg/dL — AB
RBC / HPF: >50 RBC/hpf — ABNORMAL HIGH (ref 0–5)
Specific Gravity, Urine: 1.029 (ref 1.005–1.030)
WBC, UA: >50 WBC/hpf — ABNORMAL HIGH (ref 0–5)
pH: 7 (ref 5.0–8.0)

## 2019-11-13 LAB — CBC WITH DIFFERENTIAL/PLATELET
Abs Immature Granulocytes: 0.04 10*3/uL (ref 0.00–0.07)
Basophils Absolute: 0.1 10*3/uL (ref 0.0–0.1)
Basophils Relative: 1 %
Eosinophils Absolute: 0.2 10*3/uL (ref 0.0–0.5)
Eosinophils Relative: 2 %
HCT: 40 % (ref 36.0–46.0)
Hemoglobin: 13.8 g/dL (ref 12.0–15.0)
Immature Granulocytes: 1 %
Lymphocytes Relative: 31 %
Lymphs Abs: 2.7 10*3/uL (ref 0.7–4.0)
MCH: 29.2 pg (ref 26.0–34.0)
MCHC: 34.5 g/dL (ref 30.0–36.0)
MCV: 84.7 fL (ref 80.0–100.0)
Monocytes Absolute: 0.7 10*3/uL (ref 0.1–1.0)
Monocytes Relative: 8 %
Neutro Abs: 5.2 10*3/uL (ref 1.7–7.7)
Neutrophils Relative %: 57 %
Platelets: 256 10*3/uL (ref 150–400)
RBC: 4.72 MIL/uL (ref 3.87–5.11)
RDW: 13 % (ref 11.5–15.5)
WBC: 8.9 10*3/uL (ref 4.0–10.5)
nRBC: 0 % (ref 0.0–0.2)

## 2019-11-13 LAB — BASIC METABOLIC PANEL
Anion gap: 11 (ref 5–15)
BUN: 19 mg/dL (ref 8–23)
CO2: 29 mmol/L (ref 22–32)
Calcium: 9.5 mg/dL (ref 8.9–10.3)
Chloride: 95 mmol/L — ABNORMAL LOW (ref 98–111)
Creatinine, Ser: 0.81 mg/dL (ref 0.44–1.00)
GFR calc Af Amer: >60 mL/min (ref >60)
GFR calc non Af Amer: >60 mL/min (ref >60)
Glucose, Bld: 197 mg/dL — ABNORMAL HIGH (ref 70–99)
Potassium: 2.8 mmol/L — ABNORMAL LOW (ref 3.5–5.1)
Sodium: 135 mmol/L (ref 135–145)

## 2019-11-13 LAB — LACTIC ACID, PLASMA: Lactic Acid, Venous: 2 mmol/L (ref 0.5–1.9)

## 2019-11-13 MED ORDER — POTASSIUM CHLORIDE 20 MEQ PO PACK
40.0000 meq | PACK | Freq: Once | ORAL | Status: AC
  Administered 2019-11-13: 40 meq via ORAL
  Filled 2019-11-13: qty 2

## 2019-11-13 MED ORDER — SODIUM CHLORIDE 0.9 % IV BOLUS
1000.0000 mL | Freq: Once | INTRAVENOUS | Status: AC
  Administered 2019-11-13: 1000 mL via INTRAVENOUS

## 2019-11-13 MED ORDER — CEPHALEXIN 500 MG PO CAPS: 500.0000 mg | ORAL_CAPSULE | Freq: Four times a day (QID) | ORAL | 0 refills | Status: AC

## 2019-11-13 MED ORDER — SODIUM CHLORIDE 0.9 % IV SOLN
2.0000 g | Freq: Once | INTRAVENOUS | Status: AC
  Administered 2019-11-13: 2 g via INTRAVENOUS
  Filled 2019-11-13: qty 20

## 2019-11-13 NOTE — ED Notes (Signed)
See triage note  Presents with bilateral lower back pain which started about 1 1/2 weeks ago    Now has dysuria and freq  No fever

## 2019-11-13 NOTE — ED Triage Notes (Signed)
Patient presents to the ED with dysuria and foul smelling urine x 1.5 weeks.  Patient states today she is having some right flank soreness.  Patient is ambulatory to triage.  No obvious distress at this time.

## 2019-11-13 NOTE — ED Provider Notes (Signed)
[facility] Emergency Department Provider Note ____________________________________________   First MD Initiated Contact with Patient 11/13/19 1200     (approximate)  I have reviewed the triage vital signs and the nursing notes.  HISTORY  Chief Complaint Dysuria and Flank Pain   HPI Rachel Luna is a 64 y.o. femalewho presents to the ED for evaluation of dysuria and flank pain.   Chart review indicates history of DM on multiple oral medications, HTN, anxiety, HLD.  Patient self-reports a history of lower back pain.  She reports that for the past 1 or 1.5 weeks she has had worsening right-sided lower back pain that felt different to her.  She reports this pain migrated to her right inguinal area, radiating to her genitals, "a few days ago."  Patient reports the pain was quite severe when it was present, 8/10 intensity, aching in nature.  Patient reports this pain has now resolved, after receiving fluids in triage prior to my evaluation.  Outside of the pain, patient is reporting 2 days of foul-smelling urine and dysuria.  She denies recent antibiotic usage and denies recurrent UTIs.  Denies hematuria, incontinence, diarrhea, vaginal discharge or bleeding, fevers, syncope or vomiting.  She was reports some associated nausea without vomiting.    Past Medical History:  Diagnosis Date  . Anxiety   . Arthritis   . Bipolar disorder ([place])   . chronic low back pain   . Chronic low back pain 02/18/2014  . Depression   . Diabetes mellitus without complication ([place])   . Diabetes mellitus, type II ([place])   . Fibroids   . GERD (gastroesophageal reflux disease)   . Gout   . Hyperlipidemia   . Hypertension   . Obesity     Patient Active Problem List   Diagnosis Date Noted  . Restless leg syndrome 02/15/2019  . Bipolar I disorder, most recent episode mixed ([place]) 09/14/2018  . GAD (generalized anxiety disorder) 09/14/2018  . Insomnia due to mental disorder  09/14/2018  . Dyslipidemia associated with type 2 diabetes mellitus ([place]) 07/13/2018  . Seizure disorder ([place]) 10/19/2017  . 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 ANA positive 03/05/2015  . Elevated rheumatoid factor 03/05/2015  . Hypertension associated with diabetes ([place]) 09/04/2014  . Avitaminosis D 09/04/2014  . Chronic low back pain 02/18/2014  . Menopausal hot flushes 08/14/2012  . Arthritis of knee, degenerative 04/06/2011  . Benign essential HTN 07/13/2010  . Essential (primary) hypertension 07/13/2010  . Insomnia, unspecified 07/13/2010  . Chronic pain 01/08/2010  . Affective disorder, major 11/05/2009  . Major depressive disorder 11/05/2009  . Major depressive disorder, single episode, unspecified 11/05/2009  . Allergic rhinitis 07/03/2009    Past Surgical History:  Procedure Laterality Date  . ABDOMINAL HYSTERECTOMY    . CHOLECYSTECTOMY    . FOOT SURGERY Left   . KNEE ARTHROPLASTY    . OVARIAN CYST REMOVAL    . SPINE SURGERY      Prior to Admission medications   Medication Sig Start Date End Date Taking? Authorizing Provider  ACCU-CHEK AVIVA PLUS test strip Use in the morning and after meals as needed. 07/13/18   Horald Pollen, MD  baclofen (LIORESAL) 10 MG tablet Take 1 tablet (10 mg total) by mouth 3 (three) times daily. 08/28/19   Suzzanne Cloud, NP  carbamazepine (TEGRETOL) 200 MG tablet Take 0.5 tablets (100 mg total) by mouth daily. 08/28/19   Suzzanne Cloud, NP  cephALEXin (KEFLEX) 500 MG  capsule Take 1 capsule (500 mg total) by mouth 4 (four) times daily for 5 days. 11/13/19 11/18/19  Vladimir Crofts, MD  cetirizine (ZYRTEC) 10 MG tablet Take 10 mg by mouth daily.    [provider]  clonazePAM (KLONOPIN) 0.5 MG tablet Take 1 tablet (0.5 mg total) by mouth at bedtime. 10/16/19   Suzzanne Cloud, NP  dapagliflozin propanediol (FARXIGA) 5 MG TABS tablet Take 1 tablet (5 mg total) by mouth daily before breakfast. 11/07/19 02/05/20  Horald Pollen, MD  DULoxetine (CYMBALTA) 30 MG capsule Take 1 capsule (30 mg total) by mouth daily. To be combined with 60 mg 04/24/19   Ursula Alert, MD  DULoxetine (CYMBALTA) 60 MG capsule Take 1 capsule (60 mg total) by mouth daily. 04/24/19   Ursula Alert, MD  estradiol (ESTRACE) 0.5 MG tablet Take 1 tablet (0.5 mg total) by mouth daily. 12/26/18   Shelly Bombard, MD  gabapentin (NEURONTIN) 800 MG tablet Take 1 tablet (800 mg total) by mouth 3 (three) times daily. 04/18/19   Horald Pollen, MD  glimepiride (AMARYL) 4 MG tablet Take 1 tablet (4 mg total) by mouth daily with breakfast. 04/18/19 08/08/19  Horald Pollen, MD  losartan-hydrochlorothiazide (HYZAAR) 100-25 MG tablet Take 1 tablet by mouth daily. 08/28/19   Horald Pollen, MD  meloxicam [facility]) 15 MG tablet Take 15 mg daily by mouth.  07/04/15   [provider]  pramipexole (MIRAPEX) 0.75 MG tablet One tablet three times a day 10/23/19   Kathrynn Ducking, MD  rosuvastatin (CRESTOR) 40 MG tablet Take 1 tablet (40 mg total) by mouth daily. 04/18/19 08/08/19  Horald Pollen, MD  sitaGLIPtin (JANUVIA) 100 MG tablet Take 1 tablet (100 mg total) by mouth daily. 08/08/19 11/06/19  Horald Pollen, MD    Allergies Belsomra [suvorexant], Wellbutrin [bupropion], Geodon [ziprasidone hcl], Lisinopril, and Neupro [rotigotine]  Family History  Problem Relation Age of Onset  . Diabetes Mother   . Cancer Mother        breast  . Heart disease Mother   . Mental illness Mother        bipolar disorder  . Stroke Mother   . Breast cancer Mother   . Hypertension Mother   . High Cholesterol Mother   . Diabetes Father   . Hypertension Father   . Diabetes Brother   . Bipolar disorder Brother   . Diabetes Brother   . Breast cancer Maternal Aunt   . Breast cancer Maternal Grandmother     Social History Social History   Tobacco Use  . Smoking status: Passive Smoke Exposure - Never Smoker  . Smokeless tobacco:  Never Used  . Tobacco comment: room mate smokes  Vaping Use  . Vaping Use: Never used  Substance Use Topics  . Alcohol use: No    Alcohol/week: 0.0 standard drinks  . Drug use: No    Review of Systems  Constitutional: No fever/chills Eyes: No visual changes. ENT: No sore throat. Cardiovascular: Denies chest pain. Respiratory: Denies shortness of breath. Gastrointestinal: No abdominal pain.  no vomiting.  No diarrhea.  No constipation.  Positive for flank pain and nausea. Genitourinary: Positive for dysuria and flank pain.. Musculoskeletal: Negative for back pain. Skin: Negative for rash. Neurological: Negative for headaches, focal weakness or numbness.   ____________________________________________   PHYSICAL EXAM:  VITAL SIGNS: Vitals:   11/13/19 1042 11/13/19 1408  BP: (!) 122/99 128/63  Pulse: 85 70  Resp: 18 16  Temp: 98.6 F (37 C)   SpO2: 96% 96%      Constitutional: Alert and oriented. Well appearing and in no acute distress. Eyes: Conjunctivae are normal. PERRL. EOMI. Head: Atraumatic. Nose: No congestion/rhinnorhea. Mouth/Throat: Mucous membranes are moist.  Oropharynx non-erythematous. Neck: No stridor. No cervical spine tenderness to palpation. Cardiovascular: Normal rate, regular rhythm. Grossly normal heart sounds.  Good peripheral circulation. Respiratory: Normal respiratory effort.  No retractions. Lungs CTAB. Gastrointestinal: Soft , nondistended, nontender to palpation. No abdominal bruits.  Mild and vague right-sided CVA tenderness is present, no CVA tenderness on the left. Frontal abdomen is benign. Musculoskeletal: No lower extremity tenderness nor edema.  No joint effusions. No signs of acute trauma. Neurologic:  Normal speech and language. No gross focal neurologic deficits are appreciated. No gait instability noted. Skin:  Skin is warm, dry and intact. No rash noted. Psychiatric: Mood and affect are normal. Speech and behavior are  normal.  ____________________________________________   LABS (all labs ordered are listed, but only abnormal results are displayed)  Labs Reviewed  URINALYSIS, COMPLETE (UACMP) WITH MICROSCOPIC - Abnormal; Notable for the following components:      Result Value   Color, Urine YELLOW (*)    APPearance TURBID (*)    Glucose, UA >=500 (*)    Hgb urine dipstick MODERATE (*)    Protein, ur 30 (*)    Leukocytes,Ua LARGE (*)    RBC / HPF >50 (*)    WBC, UA >50 (*)    Bacteria, UA FEW (*)    All other components within normal limits  BASIC METABOLIC PANEL - Abnormal; Notable for the following components:   Potassium 2.8 (*)    Chloride 95 (*)    Glucose, Bld 197 (*)    All other components within normal limits  LACTIC ACID, PLASMA - Abnormal; Notable for the following components:   Lactic Acid, Venous 2.0 (*)    All other components within normal limits  URINE CULTURE  CBC WITH DIFFERENTIAL/PLATELET   ____________________________________________  RADIOLOGY  ED MD interpretation:    Official radiology report(s): CT Renal Stone Study  Result Date: 11/13/2019 CLINICAL DATA:  Right flank pain. EXAM: CT ABDOMEN AND PELVIS WITHOUT CONTRAST TECHNIQUE: Multidetector CT imaging of the abdomen and pelvis was performed following the standard protocol without IV contrast. COMPARISON:  November 28, 2009. FINDINGS: Lower chest: No acute abnormality. Hepatobiliary: No focal liver abnormality is seen. Status post cholecystectomy. No biliary dilatation. Pancreas: Unremarkable. No pancreatic ductal dilatation or surrounding inflammatory changes. Spleen: Normal in size without focal abnormality. Adrenals/Urinary Tract: Stable right adrenal adenoma. Left adrenal gland appears normal. Stable right renal cyst is noted. No hydronephrosis or renal obstruction is noted. No renal or ureteral calculi are noted. Urinary bladder is unremarkable. Stomach/Bowel: Stomach is within normal limits. Appendix appears  normal. No evidence of bowel wall thickening, distention, or inflammatory changes. Vascular/Lymphatic: Aortic atherosclerosis. No enlarged abdominal or pelvic lymph nodes. Reproductive: Status post hysterectomy. No adnexal masses. Other: No abdominal wall hernia or abnormality. No abdominopelvic ascites. Musculoskeletal: No acute or significant osseous findings. IMPRESSION: 1. Stable right adrenal adenoma. 2. No hydronephrosis or renal obstruction is noted. 3. No renal or ureteral calculi are noted. 4. Aortic atherosclerosis. Aortic Atherosclerosis (ICD10-I70.0). Electronically Signed   By: Marijo Conception M.D.   On: 11/13/2019 13:09    ____________________________________________   PROCEDURES and INTERVENTIONS  Procedure(s) performed (including Critical Care):  Procedures  Medications  sodium chloride 0.9 % bolus 1,000 mL (0 mLs Intravenous  Stopped 11/13/19 1250)  cefTRIAXone (ROCEPHIN) 2 g in sodium chloride 0.9 % 100 mL IVPB (0 g Intravenous Stopped 11/13/19 1352)  potassium chloride (KLOR-CON) packet 40 mEq (40 mEq Oral Given 11/13/19 1409)    ____________________________________________   MDM / ED COURSE  Patient presenting with right flank pain and dysuria most consistent with acute cystitis and amenable to outpatient management with antibiotics.  Normal vital signs on room air.  Reassuring examination without evidence of distress, peritoneal abdomen.  Patient is well-appearing and reports relief of the pain prior to my evaluation.  She does have some vague right-sided CVA tenderness, but otherwise has no signs of acute derangements.  Blood work demonstrates hypokalemia, that was repleted orally.  Lactic acidosis to 2.0, for which she received oral fluids.  Urine testing shows infectious features, as well as concomitant blood.  With her flank and inguinal pain, suspicion for ureterolithiasis, so CT imaging was done to rule out obstruction.  No evidence of nephrolithiasis, ureterolithiasis,  ureteral obstruction or CT signs of pyelonephritis.  Provided patient single dose of Rocephin and prescription for Keflex as an outpatient.  Urine sent for culture.  We discussed outpatient management and following up with her PCP.  We discussed return precautions for the ED and patient is medically stable for discharge home.  Clinical Course as of Nov 13 1734  Tue Nov 13, 2019  1333 Educated patient reassuring CT without evidence of stone, obstructing stone.  Patient has drank multiple cups of water, packets of crackers with peanut butter and reports feeling much better.  We discussed outpatient management and return precautions for the ED.  Patient expresses understanding and agreement   [DS]    Clinical Course User Index [DS] Vladimir Crofts, MD     ____________________________________________   FINAL CLINICAL IMPRESSION(S) / ED DIAGNOSES  Final diagnoses:  Acute cystitis with hematuria  Right flank pain  Dysuria     ED Discharge Orders         Ordered    cephALEXin (KEFLEX) 500 MG capsule  4 times daily        11/13/19 1320           Zaveon Gillen   Note:  This document was prepared using Systems analyst and may include unintentional dictation errors.   Vladimir Crofts, MD 11/13/19 1739

## 2019-11-13 NOTE — Discharge Instructions (Signed)
Please take Tylenol and ibuprofen/Advil for your pain.  It is safe to take them together, or to alternate them every few hours.  Take up to 1000mg  of Tylenol at a time, up to 4 times per day.  Do not take more than 4000 mg of Tylenol in 24 hours.  For ibuprofen, take 400-600 mg, 4-5 times per day.  You are being discharged with a prescription for Keflex antibiotic to take 4 times a day for the next 5 days to treat a bladder infection.  Please finish all 20 pills, even if you continue to feel better.  If you develop worsening symptoms, fevers, please return to the ED for evaluation.

## 2019-11-13 NOTE — ED Notes (Signed)
Pt in ct 

## 2019-11-15 LAB — URINE CULTURE: Culture: >=100000 — AB

## 2019-11-20 ENCOUNTER — Other Ambulatory Visit: Payer: Self-pay

## 2019-11-20 ENCOUNTER — Encounter: Payer: Self-pay | Admitting: Emergency Medicine

## 2019-11-20 ENCOUNTER — Telehealth: Payer: Self-pay | Admitting: *Deleted

## 2019-11-20 ENCOUNTER — Ambulatory Visit (INDEPENDENT_AMBULATORY_CARE_PROVIDER_SITE_OTHER): Payer: Medicare Other | Admitting: Emergency Medicine

## 2019-11-20 VITALS — BP 149/78 | HR 81 | Temp 96.9°F | Resp 16 | Ht 64.5 in | Wt 274.0 lb

## 2019-11-20 DIAGNOSIS — E1159 Type 2 diabetes mellitus with other circulatory complications: Secondary | ICD-10-CM | POA: Diagnosis not present

## 2019-11-20 DIAGNOSIS — E876 Hypokalemia: Secondary | ICD-10-CM | POA: Insufficient documentation

## 2019-11-20 DIAGNOSIS — I700 Atherosclerosis of aorta: Secondary | ICD-10-CM | POA: Diagnosis not present

## 2019-11-20 DIAGNOSIS — N390 Urinary tract infection, site not specified: Secondary | ICD-10-CM | POA: Diagnosis not present

## 2019-11-20 DIAGNOSIS — R350 Frequency of micturition: Secondary | ICD-10-CM

## 2019-11-20 DIAGNOSIS — B9620 Unspecified Escherichia coli [E. coli] as the cause of diseases classified elsewhere: Secondary | ICD-10-CM

## 2019-11-20 DIAGNOSIS — I10 Essential (primary) hypertension: Secondary | ICD-10-CM

## 2019-11-20 LAB — POC MICROSCOPIC URINALYSIS (UMFC): Mucus: ABSENT

## 2019-11-20 LAB — BASIC METABOLIC PANEL
BUN/Creatinine Ratio: 21 (ref 12–28)
BUN: 14 mg/dL (ref 8–27)
CO2: 25 mmol/L (ref 20–29)
Calcium: 10.3 mg/dL (ref 8.7–10.3)
Chloride: 96 mmol/L (ref 96–106)
Creatinine, Ser: 0.66 mg/dL (ref 0.57–1.00)
GFR calc Af Amer: 109 mL/min/{1.73_m2} (ref >59)
GFR calc non Af Amer: 95 mL/min/{1.73_m2} (ref >59)
Glucose: 188 mg/dL — ABNORMAL HIGH (ref 65–99)
Potassium: 3.3 mmol/L — ABNORMAL LOW (ref 3.5–5.2)
Sodium: 139 mmol/L (ref 134–144)

## 2019-11-20 LAB — POCT URINALYSIS DIP (MANUAL ENTRY)
Bilirubin, UA: NEGATIVE
Glucose, UA: 500 mg/dL — AB
Ketones, POC UA: NEGATIVE mg/dL
Nitrite, UA: NEGATIVE
Protein Ur, POC: NEGATIVE mg/dL
Spec Grav, UA: 1.02 (ref 1.010–1.025)
Urobilinogen, UA: 0.2 E.U./dL
pH, UA: 7 (ref 5.0–8.0)

## 2019-11-20 MED ORDER — AMLODIPINE BESYLATE-VALSARTAN 5-160 MG PO TABS: 1.0000 | ORAL_TABLET | Freq: Every day | ORAL | 3 refills | Status: DC

## 2019-11-20 MED ORDER — SULFAMETHOXAZOLE-TRIMETHOPRIM 800-160 MG PO TABS: 1.0000 | ORAL_TABLET | Freq: Two times a day (BID) | ORAL | 0 refills | Status: AC

## 2019-11-20 NOTE — Patient Instructions (Addendum)
   If you have lab work done today you will be contacted with your lab results within the next 2 weeks.  If you have not heard from us then please contact us. The fastest way to get your results is to register for My Chart.   IF you received an x-ray today, you will receive an invoice from [facility]. Please contact  Radiology at [phone] with questions or concerns regarding your invoice.   IF you received labwork today, you will receive an invoice from [facility]. Please contact [facility] at [phone] with questions or concerns regarding your invoice.   Our billing staff will not be able to assist you with questions regarding bills from these companies.  You will be contacted with the lab results as soon as they are available. The fastest way to get your results is to activate your My Chart account. Instructions are located on the last page of this paperwork. If you have not heard from us regarding the results in 2 weeks, please contact this office.     Urinary Tract Infection, Adult A urinary tract infection (UTI) is an infection of any part of the urinary tract. The urinary tract includes:  The kidneys.  The ureters.  The bladder.  The urethra. These organs make, store, and get rid of pee (urine) in the body. What are the causes? This is caused by germs (bacteria) in your genital area. These germs grow and cause swelling (inflammation) of your urinary tract. What increases the risk? You are more likely to develop this condition if:  You have a small, thin tube (catheter) to drain pee.  You cannot control when you pee or poop (incontinence).  You are female, and: ? You use these methods to prevent pregnancy:  A medicine that kills sperm (spermicide).  A device that blocks sperm (diaphragm). ? You have low levels of a female hormone (estrogen). ? You are pregnant.  You have genes that add to your risk.  You are sexually active.  You take  antibiotic medicines.  You have trouble peeing because of: ? A prostate that is bigger than normal, if you are female. ? A blockage in the part of your body that drains pee from the bladder (urethra). ? A kidney stone. ? A nerve condition that affects your bladder (neurogenic bladder). ? Not getting enough to drink. ? Not peeing often enough.  You have other conditions, such as: ? Diabetes. ? A weak disease-fighting system (immune system). ? Sickle cell disease. ? Gout. ? Injury of the spine. What are the signs or symptoms? Symptoms of this condition include:  Needing to pee right away (urgently).  Peeing often.  Peeing small amounts often.  Pain or burning when peeing.  Blood in the pee.  Pee that smells bad or not like normal.  Trouble peeing.  Pee that is cloudy.  Fluid coming from the vagina, if you are female.  Pain in the belly or lower back. Other symptoms include:  Throwing up (vomiting).  No urge to eat.  Feeling mixed up (confused).  Being tired and grouchy (irritable).  A fever.  Watery poop (diarrhea). How is this treated? This condition may be treated with:  Antibiotic medicine.  Other medicines.  Drinking enough water. Follow these instructions at home:  Medicines  Take over-the-counter and prescription medicines only as told by your doctor.  If you were prescribed an antibiotic medicine, take it as told by your doctor. Do not stop taking it even if   you start to feel better. General instructions  Make sure you: ? Pee until your bladder is empty. ? Do not hold pee for a long time. ? Empty your bladder after sex. ? Wipe from front to back after pooping if you are a female. Use each tissue one time when you wipe.  Drink enough fluid to keep your pee pale yellow.  Keep all follow-up visits as told by your doctor. This is important. Contact a doctor if:  You do not get better after 1-2 days.  Your symptoms go away and then come  back. Get help right away if:  You have very bad back pain.  You have very bad pain in your lower belly.  You have a fever.  You are sick to your stomach (nauseous).  You are throwing up. Summary  A urinary tract infection (UTI) is an infection of any part of the urinary tract.  This condition is caused by germs in your genital area.  There are many risk factors for a UTI. These include having a small, thin tube to drain pee and not being able to control when you pee or poop.  Treatment includes antibiotic medicines for germs.  Drink enough fluid to keep your pee pale yellow. This information is not intended to replace advice given to you by your health care provider. Make sure you discuss any questions you have with your health care provider. Document Revised: 02/09/2018 Document Reviewed: 09/01/2017 Elsevier Patient Education  2020 [employer]  

## 2019-11-20 NOTE — Assessment & Plan Note (Signed)
Still symptomatic.  Just finished 7-day course of Keflex.  Culture grew E. coli resistant to ampicillin but sensitive to all other antibiotics.  Will start 7-day course of Bactrim DS twice a day.

## 2019-11-20 NOTE — Progress Notes (Signed)
Rachel Luna 64 y.o.   Chief Complaint  Patient presents with  . Nephrolithiasis    per patient she went to ED 11/13/2019  . Urinary Frequency    with pain 11/12/2019    HISTORY OF PRESENT ILLNESS: This is a 64 y.o. female here for follow-up of ED visit on 11/13/2019 when she was found to have E. coli UTI and hypokalemia of 2.8. Was started on Keflex, potassium was replaced in the ER, normal renal CT study. Patient has a history of hypertension and presently is on losartan-HCTZ. Still having some UTI symptoms.  Just finished course of Keflex. MDM / ED COURSE  Patient presenting with right flank pain and dysuria most consistent with acute cystitis and amenable to outpatient management with antibiotics.  Normal vital signs on room air.  Reassuring examination without evidence of distress, peritoneal abdomen.  Patient is well-appearing and reports relief of the pain prior to my evaluation.  She does have some vague right-sided CVA tenderness, but otherwise has no signs of acute derangements.  Blood work demonstrates hypokalemia, that was repleted orally.  Lactic acidosis to 2.0, for which she received oral fluids.  Urine testing shows infectious features, as well as concomitant blood.  With her flank and inguinal pain, suspicion for ureterolithiasis, so CT imaging was done to rule out obstruction.  No evidence of nephrolithiasis, ureterolithiasis, ureteral obstruction or CT signs of pyelonephritis.  Provided patient single dose of Rocephin and prescription for Keflex as an outpatient.  Urine sent for culture.  We discussed outpatient management and following up with her PCP.  We discussed return precautions for the ED and patient is medically stable for discharge home. HPI   Prior to Admission medications   Medication Sig Start Date End Date Taking? Authorizing Provider  ACCU-CHEK AVIVA PLUS test strip Use in the morning and after meals as needed. 07/13/18  Yes Daiana Vitiello, Ines Bloomer, MD  baclofen  (LIORESAL) 10 MG tablet Take 1 tablet (10 mg total) by mouth 3 (three) times daily. 08/28/19  Yes Suzzanne Cloud, NP  carbamazepine (TEGRETOL) 200 MG tablet Take 0.5 tablets (100 mg total) by mouth daily. 08/28/19  Yes Suzzanne Cloud, NP  cetirizine (ZYRTEC) 10 MG tablet Take 10 mg by mouth daily.   Yes [provider]  clonazePAM (KLONOPIN) 0.5 MG tablet Take 1 tablet (0.5 mg total) by mouth at bedtime. 10/16/19  Yes Suzzanne Cloud, NP  dapagliflozin propanediol (FARXIGA) 5 MG TABS tablet Take 1 tablet (5 mg total) by mouth daily before breakfast. 11/07/19 02/05/20 Yes Aminat [place], Ines Bloomer, MD  DULoxetine (CYMBALTA) 30 MG capsule Take 1 capsule (30 mg total) by mouth daily. To be combined with 60 mg 04/24/19  Yes Eappen, Ria Clock, MD  DULoxetine (CYMBALTA) 60 MG capsule Take 1 capsule (60 mg total) by mouth daily. 04/24/19  Yes Ursula Alert, MD  estradiol (ESTRACE) 0.5 MG tablet Take 1 tablet (0.5 mg total) by mouth daily. 12/26/18  Yes Shelly Bombard, MD  gabapentin (NEURONTIN) 800 MG tablet Take 1 tablet (800 mg total) by mouth 3 (three) times daily. 04/18/19  Yes Deloyd Handy, Ines Bloomer, MD  losartan-hydrochlorothiazide (HYZAAR) 100-25 MG tablet Take 1 tablet by mouth daily. 08/28/19  Yes Reyno, Ines Bloomer, MD  meloxicam (MOBIC) 15 MG tablet Take 15 mg daily by mouth.  07/04/15  Yes [provider]  pramipexole (MIRAPEX) 0.75 MG tablet One tablet three times a day 10/23/19  Yes Kathrynn Ducking, MD  glimepiride (AMARYL) 4 MG tablet  Take 1 tablet (4 mg total) by mouth daily with breakfast. 04/18/19 08/08/19  Horald Pollen, MD  rosuvastatin (CRESTOR) 40 MG tablet Take 1 tablet (40 mg total) by mouth daily. 04/18/19 08/08/19  Horald Pollen, MD  sitaGLIPtin (JANUVIA) 100 MG tablet Take 1 tablet (100 mg total) by mouth daily. 08/08/19 11/06/19  Horald Pollen, MD    Allergies  Allergen Reactions  . Belsomra [Suvorexant] Other (See Comments)    Vivid dreams  .  Wellbutrin [Bupropion] Other (See Comments)    Irritability and Agitation  . Geodon [Ziprasidone Hcl] Other (See Comments)    Worsened Restless Leg Syndrome  . Lisinopril Cough  . Neupro [Rotigotine] Rash    Patient Active Problem List   Diagnosis Date Noted  . Aortic atherosclerosis ([place]) 11/20/2019  . Restless leg syndrome 02/15/2019  . Bipolar I disorder, most recent episode mixed ([place]) 09/14/2018  . GAD (generalized anxiety disorder) 09/14/2018  . 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 Dyslipidemia associated with type 2 diabetes mellitus ([place]) 07/13/2018  . Seizure disorder ([place]) 10/19/2017  . 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 ANA positive 03/05/2015  . Elevated rheumatoid factor 03/05/2015  . Hypertension associated with diabetes ([place]) 09/04/2014  . Avitaminosis D 09/04/2014  . Chronic low back pain 02/18/2014  . Menopausal hot flushes 08/14/2012  . Arthritis of knee, degenerative 04/06/2011  . Benign essential HTN 07/13/2010  . Essential (primary) hypertension 07/13/2010  . Insomnia, unspecified 07/13/2010  . Chronic pain 01/08/2010  . Affective disorder, major 11/05/2009  . Major depressive disorder 11/05/2009  . Major depressive disorder, single episode, unspecified 11/05/2009  . Allergic rhinitis 07/03/2009    Past Medical History:  Diagnosis Date  . Anxiety   . Arthritis   . Bipolar disorder ([place])   . chronic low back pain   . Chronic low back pain 02/18/2014  . Depression   . Diabetes mellitus without complication ([place])   . Diabetes mellitus, type II ([place])   . Fibroids   . GERD (gastroesophageal reflux disease)   . Gout   . Hyperlipidemia   . Hypertension   . Obesity     Past Surgical History:  Procedure Laterality Date  . ABDOMINAL HYSTERECTOMY    . CHOLECYSTECTOMY    . FOOT SURGERY Left   . KNEE ARTHROPLASTY    . OVARIAN CYST REMOVAL    . SPINE SURGERY      Social History   Socioeconomic History  . Marital status:  Divorced    Spouse name: Not on file  . Number of children: 0  . Years of education: 10  . Highest education level: 10th grade  Occupational History    Employer: UNEMPLOYED  Tobacco Use  . Smoking status: Passive Smoke Exposure - Never Smoker  . Smokeless tobacco: Never Used  . Tobacco comment: room mate smokes  Vaping Use  . Vaping Use: Never used  Substance and Sexual Activity  . Alcohol use: No    Alcohol/week: 0.0 standard drinks  . Drug use: No  . Sexual activity: Not Currently  Other Topics Concern  . Not on file  Social History Narrative   Patient is divorced and lives with a roommate.   Patient is on disability.   Patient has a 10th grade education.   Patient is right-handed.   Patient drinks 6 sodas daily.         Social Determinants of Health   Financial Resource Strain:   . Difficulty of  Paying Living Expenses: Not on file  Food Insecurity:   . Worried About Charity fundraiser in the Last Year: Not on file  . Ran Out of Food in the Last Year: Not on file  Transportation Needs:   . Lack of Transportation (Medical): Not on file  . Lack of Transportation (Non-Medical): Not on file  Physical Activity:   . Days of Exercise per Week: Not on file  . Minutes of Exercise per Session: Not on file  Stress:   . Feeling of Stress : Not on file  Social Connections:   . Frequency of Communication with Friends and Family: Not on file  . Frequency of Social Gatherings with Friends and Family: Not on file  . Attends Religious Services: Not on file  . Active Member of Clubs or Organizations: Not on file  . Attends Archivist Meetings: Not on file  . Marital Status: Not on file  Intimate Partner Violence:   . Fear of Current or Ex-Partner: Not on file  . Emotionally Abused: Not on file  . Physically Abused: Not on file  . Sexually Abused: Not on file    Family History  Problem Relation Age of Onset  . Diabetes Mother   . Cancer Mother        breast  .  Heart disease Mother   . Mental illness Mother        bipolar disorder  . Stroke Mother   . Breast cancer Mother   . Hypertension Mother   . High Cholesterol Mother   . Diabetes Father   . Hypertension Father   . Diabetes Brother   . Bipolar disorder Brother   . Diabetes Brother   . Breast cancer Maternal Aunt   . Breast cancer Maternal Grandmother      Review of Systems  Constitutional: Negative.  Negative for chills and fever.  HENT: Negative.  Negative for congestion and sore throat.   Respiratory: Negative.  Negative for cough and shortness of breath.   Cardiovascular: Negative.  Negative for chest pain and palpitations.  Gastrointestinal: Negative.  Negative for abdominal pain, diarrhea, nausea and vomiting.  Genitourinary: Positive for dysuria and frequency. Negative for flank pain, hematuria and urgency.  Musculoskeletal: Negative.  Negative for myalgias and neck pain.  Skin: Negative.  Negative for rash.  Neurological: Negative for dizziness and headaches.  All other systems reviewed and are negative.    Today's Vitals   11/20/19 0919  BP: (!) 149/78  Pulse: 81  Resp: 16  Temp: (!) 96.9 F (36.1 C)  TempSrc: Temporal  SpO2: 93%  Weight: 274 lb (124.3 kg)  Height: 5' 4.5" (1.638 m)   Body mass index is 46.31 kg/m.   Physical Exam Vitals reviewed.  Constitutional:      Appearance: Normal appearance. She is obese.  HENT:     Head: Normocephalic.  Eyes:     Extraocular Movements: Extraocular movements intact.     Pupils: Pupils are equal, round, and reactive to light.  Cardiovascular:     Rate and Rhythm: Normal rate and regular rhythm.     Heart sounds: Normal heart sounds.  Pulmonary:     Effort: Pulmonary effort is normal.     Breath sounds: Normal breath sounds.  Abdominal:     Palpations: Abdomen is soft.     Tenderness: There is no abdominal tenderness. There is no right CVA tenderness or left CVA tenderness.  Musculoskeletal:  General:  Normal range of motion.     Cervical back: Normal range of motion and neck supple.  Skin:    General: Skin is warm and dry.     Capillary Refill: Capillary refill takes less than 2 seconds.  Neurological:     General: No focal deficit present.     Mental Status: She is alert and oriented to person, place, and time.  Psychiatric:        Mood and Affect: Mood normal.        Behavior: Behavior normal.    Results for orders placed or performed in visit on 11/20/19 (from the past 24 hour(s))  POCT urinalysis dipstick     Status: Abnormal   Collection Time: 11/20/19  9:33 AM  Result Value Ref Range   Color, UA yellow yellow   Clarity, UA cloudy (A) clear   Glucose, UA =500 (A) negative mg/dL   Bilirubin, UA negative negative   Ketones, POC UA negative negative mg/dL   Spec Grav, UA 1.020 1.010 - 1.025   Blood, UA moderate (A) negative   pH, UA 7.0 5.0 - 8.0   Protein Ur, POC negative negative mg/dL   Urobilinogen, UA 0.2 0.2 or 1.0 E.U./dL   Nitrite, UA Negative Negative   Leukocytes, UA Large (3+) (A) Negative  POCT Microscopic Urinalysis ([facility])     Status: Abnormal   Collection Time: 11/20/19  9:45 AM  Result Value Ref Range   WBC,UR,HPF,POC Too numerous to count  (A) None WBC/hpf   RBC,UR,HPF,POC Moderate (A) None RBC/hpf   Bacteria Moderate (A) None, Too numerous to count   Mucus Absent Absent   Epithelial Cells, UR Per Microscopy Few (A) None, Too numerous to count cells/hpf   A total of 30 minutes was spent with the patient, greater than 50% of which was in counseling/coordination of care regarding diagnosis and treatment of partially treated E. coli urinary tract infection, review of most recent emergency department visit, review of most recent blood work results including today's urinalysis, review of most recent office visit notes, need to restart antibiotics, hypokalemia and treatment including stopping thiazide diuretic and starting new medication Exforge 5/160 daily, diet  and nutrition, medication side effects, need to increase fluid intake, ED precautions, prognosis and need for follow-up.   ASSESSMENT & PLAN: Essential (primary) hypertension Elevated systolic blood pressure.  Hypokalemia most likely secondary to thiazide diuretic.  Will discontinue losartan-HCTZ and start Exforge 5-160 mg daily.  E. coli UTI Still symptomatic.  Just finished 7-day course of Keflex.  Culture grew E. coli resistant to ampicillin but sensitive to all other antibiotics.  Will start 7-day course of Bactrim DS twice a day.  Rachel Luna was seen today for nephrolithiasis and urinary frequency.  Diagnoses and all orders for this visit:  E. coli UTI Comments: Partially treated Orders: -     sulfamethoxazole-trimethoprim (BACTRIM DS) 800-160 MG tablet; Take 1 tablet by mouth 2 (two) times daily for 7 days.  Aortic atherosclerosis ([facility])  Urinary frequency -     POCT Microscopic Urinalysis ([facility]) -     POCT urinalysis dipstick -     Urine Culture  Hypokalemia -     Basic metabolic panel  Hypertension associated with diabetes ([place])  Essential (primary) hypertension  Other orders -     amLODipine-valsartan (EXFORGE) 5-160 MG tablet; Take 1 tablet by mouth daily.    Patient Instructions       If you have lab work done today you will be  contacted with your lab results within the next 2 weeks.  If you have not heard from [place] then please contact us. The fastest way to get your results is to register for My Chart.   IF you received an x-ray today, you will receive an invoice from [facility]. Please contact [facility] at [phone] with questions or concerns regarding your invoice.   IF you received labwork today, you will receive an invoice from [facility]. Please contact [facility] at [phone] with questions or concerns regarding your invoice.   Our billing staff will not be able to assist you with questions regarding bills from these  companies.  You will be contacted with the lab results as soon as they are available. The fastest way to get your results is to activate your My Chart account. Instructions are located on the last page of this paperwork. If you have not heard from [place] regarding the results in 2 weeks, please contact this office.     Urinary Tract Infection, Adult A urinary tract infection (UTI) is an infection of any part of the urinary tract. The urinary tract includes:  The kidneys.  The ureters.  The bladder.  The urethra. These organs make, store, and get rid of pee (urine) in the body. What are the causes? This is caused by germs (bacteria) in your genital area. These germs grow and cause swelling (inflammation) of your urinary tract. What increases the risk? You are more likely to develop this condition if:  You have a small, thin tube (catheter) to drain pee.  You cannot control when you pee or poop (incontinence).  You are female, and: ? You use these methods to prevent pregnancy:  A medicine that kills sperm (spermicide).  A device that blocks sperm (diaphragm). ? You have low levels of a female hormone (estrogen). ? You are pregnant.  You have genes that add to your risk.  You are sexually active.  You take antibiotic medicines.  You have trouble peeing because of: ? A prostate that is bigger than normal, if you are female. ? A blockage in the part of your body that drains pee from the bladder (urethra). ? A kidney stone. ? A nerve condition that affects your bladder (neurogenic bladder). ? Not getting enough to drink. ? Not peeing often enough.  You have other conditions, such as: ? Diabetes. ? A weak disease-fighting system (immune system). ? Sickle cell disease. ? Gout. ? Injury of the spine. What are the signs or symptoms? Symptoms of this condition include:  Needing to pee right away (urgently).  Peeing often.  Peeing small amounts often.  Pain or burning  when peeing.  Blood in the pee.  Pee that smells bad or not like normal.  Trouble peeing.  Pee that is cloudy.  Fluid coming from the vagina, if you are female.  Pain in the belly or lower back. Other symptoms include:  Throwing up (vomiting).  No urge to eat.  Feeling mixed up (confused).  Being tired and grouchy (irritable).  A fever.  Watery poop (diarrhea). How is this treated? This condition may be treated with:  Antibiotic medicine.  Other medicines.  Drinking enough water. Follow these instructions at home:  Medicines  Take over-the-counter and prescription medicines only as told by your doctor.  If you were prescribed an antibiotic medicine, take it as told by your doctor. Do not stop taking it even if you start to feel better. General instructions  Make sure you: ?  Pee until your bladder is empty. ? Do not hold pee for a long time. ? Empty your bladder after sex. ? Wipe from front to back after pooping if you are a female. Use each tissue one time when you wipe.  Drink enough fluid to keep your pee pale yellow.  Keep all follow-up visits as told by your doctor. This is important. Contact a doctor if:  You do not get better after 1-2 days.  Your symptoms go away and then come back. Get help right away if:  You have very bad back pain.  You have very bad pain in your lower belly.  You have a fever.  You are sick to your stomach (nauseous).  You are throwing up. Summary  A urinary tract infection (UTI) is an infection of any part of the urinary tract.  This condition is caused by germs in your genital area.  There are many risk factors for a UTI. These include having a small, thin tube to drain pee and not being able to control when you pee or poop.  Treatment includes antibiotic medicines for germs.  Drink enough fluid to keep your pee pale yellow. This information is not intended to replace advice given to you by your health care  provider. Make sure you discuss any questions you have with your health care provider. Document Revised: 02/09/2018 Document Reviewed: 09/01/2017 Elsevier Patient Education  2020 [employer].      Agustina Caroli, MD [facility]

## 2019-11-20 NOTE — Telephone Encounter (Signed)
Spoke to patient to let her know Dr Mitchel Honour ordered labs to check potassium and will prescribe medication based on pending results. Patient understood message.

## 2019-11-20 NOTE — Assessment & Plan Note (Signed)
Elevated systolic blood pressure.  Hypokalemia most likely secondary to thiazide diuretic.  Will discontinue losartan-HCTZ and start Exforge 5-160 mg daily.

## 2019-11-21 ENCOUNTER — Encounter: Payer: Self-pay | Admitting: Psychiatry

## 2019-11-21 ENCOUNTER — Telehealth (INDEPENDENT_AMBULATORY_CARE_PROVIDER_SITE_OTHER): Payer: Medicare Other | Admitting: Psychiatry

## 2019-11-21 DIAGNOSIS — F5105 Insomnia due to other mental disorder: Secondary | ICD-10-CM | POA: Diagnosis not present

## 2019-11-21 DIAGNOSIS — F411 Generalized anxiety disorder: Secondary | ICD-10-CM

## 2019-11-21 DIAGNOSIS — F3178 Bipolar disorder, in full remission, most recent episode mixed: Secondary | ICD-10-CM

## 2019-11-21 LAB — URINE CULTURE

## 2019-11-21 MED ORDER — DULOXETINE HCL 60 MG PO CPEP: 60.0000 mg | ORAL_CAPSULE | Freq: Every day | ORAL | 1 refills | Status: DC

## 2019-11-21 MED ORDER — DULOXETINE HCL 30 MG PO CPEP: 30.0000 mg | ORAL_CAPSULE | Freq: Every day | ORAL | 1 refills | Status: DC

## 2019-11-21 NOTE — Progress Notes (Signed)
Provider Location : [facility] Patient Location : Home  Participants: Patient , Provider  Virtual Visit via Video Note  I connected with Rachel Luna on 11/21/19 at 10:00 AM EDT by a video enabled telemedicine application and verified that I am speaking with the correct person using two identifiers.   I discussed the limitations of evaluation and management by telemedicine and the availability of in person appointments. The patient expressed understanding and agreed to proceed.   I discussed the assessment and treatment plan with the patient. The patient was provided an opportunity to ask questions and all were answered. The patient agreed with the plan and demonstrated an understanding of the instructions.   The patient was advised to call back or seek an in-person evaluation if the symptoms worsen or if the condition fails to improve as anticipated.   [facility] MD OP Progress Note  11/21/2019 12:54 PM Rachel Luna  MRN:  [id number]  Chief Complaint:  Chief Complaint    Follow-up     HPI: Rachel Luna is a 64 year old Caucasian female, divorced, disabled, lives in [place], has a history of bipolar disorder, GAD, insomnia, seizure disorder, morbid obesity, chronic pain, neuropathy was evaluated by telemedicine today.  Patient today reports she is currently struggling with possible bladder infection.  Per review of medical records patient was seen in the emergency department on 11/13/2019 for urinary frequency as well as yesterday was seen in office at  primary care provider's office-Dr. Mitchel Honour.  Patient was diagnosed with E. coli UTI and hypokalemia and was started on Bactrim.  Patient today reports she continues to struggle even though she is improving.  She reports her health issues does make her anxious however overall she is coping okay.  She does have her roommate to support her.  She continues to follow-up with her therapist [name].  Patient is compliant on Cymbalta.   Denies side effects.  Patient denies any suicidality, homicidality or perceptual disturbances.  Patient denies any other concerns today.   Visit Diagnosis:    ICD-10-CM   1. Bipolar disorder, in full remission, most recent episode mixed (HCC)  F31.78 DULoxetine (CYMBALTA) 30 MG capsule    DULoxetine (CYMBALTA) 60 MG capsule  2. GAD (generalized anxiety disorder)  F41.1 DULoxetine (CYMBALTA) 30 MG capsule    DULoxetine (CYMBALTA) 60 MG capsule  3. Insomnia due to mental disorder  F51.05     Past Psychiatric History: I have reviewed past psychiatric history from my progress note on 10/19/2017.  Past trials of Abilify, Klonopin, Elavil, Cymbalta, Tegretol, Effexor, trazodone, Belsomra  Past Medical History:  Past Medical History:  Diagnosis Date  . Anxiety   . Arthritis   . Bipolar disorder ([place])   . chronic low back pain   . Chronic low back pain 02/18/2014  . Depression   . Diabetes mellitus without complication ([place])   . Diabetes mellitus, type II ([place])   . Fibroids   . GERD (gastroesophageal reflux disease)   . Gout   . Hyperlipidemia   . Hypertension   . Obesity     Past Surgical History:  Procedure Laterality Date  . ABDOMINAL HYSTERECTOMY    . CHOLECYSTECTOMY    . FOOT SURGERY Left   . KNEE ARTHROPLASTY    . OVARIAN CYST REMOVAL    . SPINE SURGERY      Family Psychiatric History: I have reviewed family psychiatric history from my progress note on 10/19/2017  Family History:  Family History  Problem  Relation Age of Onset  . Diabetes Mother   . Cancer Mother        breast  . Heart disease Mother   . Mental illness Mother        bipolar disorder  . Stroke Mother   . Breast cancer Mother   . Hypertension Mother   . High Cholesterol Mother   . Diabetes Father   . Hypertension Father   . Diabetes Brother   . Bipolar disorder Brother   . Diabetes Brother   . Breast cancer Maternal Aunt   . Breast cancer Maternal Grandmother     Social History: I have  reviewed social history from my progress note on 10/19/2017 Social History   Socioeconomic History  . Marital status: Divorced    Spouse name: Not on file  . Number of children: 0  . Years of education: 10  . Highest education level: 10th grade  Occupational History    Employer: UNEMPLOYED  Tobacco Use  . Smoking status: Passive Smoke Exposure - Never Smoker  . Smokeless tobacco: Never Used  . Tobacco comment: room mate smokes  Vaping Use  . Vaping Use: Never used  Substance and Sexual Activity  . Alcohol use: No    Alcohol/week: 0.0 standard drinks  . Drug use: No  . Sexual activity: Not Currently  Other Topics Concern  . Not on file  Social History Narrative   Patient is divorced and lives with a roommate.   Patient is on disability.   Patient has a 10th grade education.   Patient is right-handed.   Patient drinks 6 sodas daily.         Social Determinants of Health   Financial Resource Strain:   . Difficulty of Paying Living Expenses: Not on file  Food Insecurity:   . Worried About Charity fundraiser in the Last Year: Not on file  . Ran Out of Food in the Last Year: Not on file  Transportation Needs:   . Lack of Transportation (Medical): Not on file  . Lack of Transportation (Non-Medical): Not on file  Physical Activity:   . Days of Exercise per Week: Not on file  . Minutes of Exercise per Session: Not on file  Stress:   . Feeling of Stress : Not on file  Social Connections:   . Frequency of Communication with Friends and Family: Not on file  . Frequency of Social Gatherings with Friends and Family: Not on file  . Attends Religious Services: Not on file  . Active Member of Clubs or Organizations: Not on file  . Attends Archivist Meetings: Not on file  . Marital Status: Not on file    Allergies:  Allergies  Allergen Reactions  . Belsomra [Suvorexant] Other (See Comments)    Vivid dreams  . Wellbutrin [Bupropion] Other (See Comments)     Irritability and Agitation  . Geodon [Ziprasidone Hcl] Other (See Comments)    Worsened Restless Leg Syndrome  . Lisinopril Cough  . Neupro [Rotigotine] Rash    Metabolic Disorder Labs: Lab Results  Component Value Date   HGBA1C 10.7 (A) 11/07/2019   No results found for: PROLACTIN Lab Results  Component Value Date   CHOL 144 11/07/2019   TRIG 165 (H) 11/07/2019   HDL 52 11/07/2019   CHOLHDL 2.8 11/07/2019   VLDL 41 (H) 03/25/2014   LDLCALC 64 11/07/2019   LDLCALC 89 08/08/2019   Lab Results  Component Value Date   TSH 2.030  07/13/2018    Therapeutic Level Labs: No results found for: LITHIUM No results found for: VALPROATE No components found for:  CBMZ  Current Medications: Current Outpatient Medications  Medication Sig Dispense Refill  . ACCU-CHEK AVIVA PLUS test strip Use in the morning and after meals as needed. 100 each 5  . amLODipine-valsartan (EXFORGE) 5-160 MG tablet Take 1 tablet by mouth daily. 90 tablet 3  . baclofen (LIORESAL) 10 MG tablet Take 1 tablet (10 mg total) by mouth 3 (three) times daily. 270 each 1  . carbamazepine (TEGRETOL) 200 MG tablet Take 0.5 tablets (100 mg total) by mouth daily. 45 tablet 1  . cetirizine (ZYRTEC) 10 MG tablet Take 10 mg by mouth daily.    . clonazePAM (KLONOPIN) 0.5 MG tablet Take 1 tablet (0.5 mg total) by mouth at bedtime. 30 tablet 3  . dapagliflozin propanediol (FARXIGA) 5 MG TABS tablet Take 1 tablet (5 mg total) by mouth daily before breakfast. 90 tablet 3  . DULoxetine (CYMBALTA) 30 MG capsule Take 1 capsule (30 mg total) by mouth daily. To be combined with 60 mg 90 capsule 1  . DULoxetine (CYMBALTA) 60 MG capsule Take 1 capsule (60 mg total) by mouth daily. To be combined with 30 mg 90 capsule 1  . estradiol (ESTRACE) 0.5 MG tablet Take 1 tablet (0.5 mg total) by mouth daily. 30 tablet 11  . gabapentin (NEURONTIN) 800 MG tablet Take 1 tablet (800 mg total) by mouth 3 (three) times daily. 270 tablet 1  . meloxicam  (MOBIC) 15 MG tablet Take 15 mg daily by mouth.     . pramipexole (MIRAPEX) 0.75 MG tablet One tablet three times a day 270 tablet 1  . sulfamethoxazole-trimethoprim (BACTRIM DS) 800-160 MG tablet Take 1 tablet by mouth 2 (two) times daily for 7 days. 14 tablet 0  . glimepiride (AMARYL) 4 MG tablet Take 1 tablet (4 mg total) by mouth daily with breakfast. 90 tablet 1  . rosuvastatin (CRESTOR) 40 MG tablet Take 1 tablet (40 mg total) by mouth daily. 90 tablet 3  . sitaGLIPtin (JANUVIA) 100 MG tablet Take 1 tablet (100 mg total) by mouth daily. 90 tablet 1   No current facility-administered medications for this visit.     Musculoskeletal: Strength & Muscle Tone: UTA Gait & Station: normal Patient leans: N/A  Psychiatric Specialty Exam: Review of Systems  Constitutional: Positive for fatigue.  Genitourinary: Positive for flank pain and urgency.  Psychiatric/Behavioral: The patient is nervous/anxious.   All other systems reviewed and are negative.   There were no vitals taken for this visit.There is no height or weight on file to calculate BMI.  General Appearance: Casual  Eye Contact:  Fair  Speech:  Normal Rate  Volume:  Normal  Mood:  Anxious due to recent health problems   Affect:  Congruent  Thought Process:  Goal Directed and Descriptions of Associations: Intact  Orientation:  Full (Time, Place, and Person)  Thought Content: Logical   Suicidal Thoughts:  No  Homicidal Thoughts:  No  Memory:  Immediate;   Fair Recent;   Fair Remote;   Fair  Judgement:  Fair  Insight:  Fair  Psychomotor Activity:  Normal  Concentration:  Concentration: Fair and Attention Span: Fair  Recall:  [employer] of Knowledge: Fair  Language: Fair  Akathisia:  No  Handed:  Right  AIMS (if indicated): UTA  Assets:  Communication Skills Desire for Improvement Housing Social Support  ADL's:  Intact  Cognition:  WNL  Sleep:  Fair   Screenings: PHQ2-9     Office Visit from 11/20/2019 in  Primary Care at [place] from 08/08/2019 in Primary Care at [facility] from 05/02/2019 in Primary Care at [facility] from 04/18/2019 in [facility] from 10/16/2018 in [place] [facility] Total Score 0 4 6 0 0  PHQ-9 Total Score -- 16 22 -- --       Assessment and Plan: Rachel Luna  Is a 64 year old Caucasian female, divorced, disabled, lives in [place], has a history of bipolar disorder, anxiety disorder, insomnia, seizure disorder, neuropathy, chronic pain, morbid obesity was evaluated by telemedicine today.  Patient is currently struggling with urinary tract infection and is currently on antibiotic.  She does have anxiety regarding the same however overall she is coping and hence we will continue medications as noted below.  Plan Bipolar disorder in remission Cymbalta 90 mg p.o. daily Tegretol 100 mg p.o. daily-prescribed by neurology for seizures Continue CBT with [name].  Anxiety disorder-stable Continue CBT Continue Cymbalta 90 mg p.o. daily  Insomnia-stable Continue melatonin as needed  Follow-up in clinic in 2 months or sooner if needed.  I have spent atleast 20 minutes face to face with patient today. More than 50 % of the time was spent for preparing to see the patient ( e.g., review of test, records ), obtaining and to review and separately obtained history , ordering medications and test ,psychoeducation and supportive psychotherapy and care coordination,as well as documenting clinical information in electronic health record. This note was generated in part or whole with voice recognition software. Voice recognition is usually quite accurate but there are transcription errors that can and very often do occur. I apologize for any typographical errors that were not detected and corrected.       Ursula Alert, MD 11/21/2019, 12:54 PM

## 2019-11-22 ENCOUNTER — Other Ambulatory Visit: Payer: Self-pay | Admitting: Emergency Medicine

## 2019-11-22 DIAGNOSIS — G2581 Restless legs syndrome: Secondary | ICD-10-CM

## 2019-11-22 MED ORDER — GABAPENTIN 800 MG PO TABS: 800.0000 mg | ORAL_TABLET | Freq: Three times a day (TID) | ORAL | 1 refills | Status: DC

## 2019-11-22 NOTE — Telephone Encounter (Signed)
Copied from [place] [id number]. Topic: Quick Communication - Rx Refill/Question >> Nov 22, 2019 11:18 AM Yvette Rack wrote: Medication: gabapentin (NEURONTIN) 800 MG tablet  Has the patient contacted their pharmacy? yes   Preferred Pharmacy (with phone number or street name): [facility], [place] Phone: [phone]  Fax: [phone]  Agent: Please be advised that RX refills may take up to 3 business days. We ask that you follow-up with your pharmacy.

## 2019-12-11 ENCOUNTER — Ambulatory Visit (INDEPENDENT_AMBULATORY_CARE_PROVIDER_SITE_OTHER): Payer: Medicare Other | Admitting: Emergency Medicine

## 2019-12-11 ENCOUNTER — Encounter: Payer: Self-pay | Admitting: Emergency Medicine

## 2019-12-11 ENCOUNTER — Other Ambulatory Visit: Payer: Self-pay

## 2019-12-11 VITALS — BP 127/73 | HR 70 | Temp 98.0°F | Resp 16 | Ht 64.5 in | Wt 289.0 lb

## 2019-12-11 DIAGNOSIS — M25472 Effusion, left ankle: Secondary | ICD-10-CM | POA: Diagnosis not present

## 2019-12-11 DIAGNOSIS — E1169 Type 2 diabetes mellitus with other specified complication: Secondary | ICD-10-CM

## 2019-12-11 DIAGNOSIS — I152 Hypertension secondary to endocrine disorders: Secondary | ICD-10-CM | POA: Diagnosis not present

## 2019-12-11 DIAGNOSIS — M25471 Effusion, right ankle: Secondary | ICD-10-CM | POA: Diagnosis not present

## 2019-12-11 DIAGNOSIS — R350 Frequency of micturition: Secondary | ICD-10-CM

## 2019-12-11 DIAGNOSIS — R300 Dysuria: Secondary | ICD-10-CM

## 2019-12-11 DIAGNOSIS — E785 Hyperlipidemia, unspecified: Secondary | ICD-10-CM

## 2019-12-11 DIAGNOSIS — N390 Urinary tract infection, site not specified: Secondary | ICD-10-CM | POA: Diagnosis not present

## 2019-12-11 DIAGNOSIS — E1159 Type 2 diabetes mellitus with other circulatory complications: Secondary | ICD-10-CM | POA: Diagnosis not present

## 2019-12-11 DIAGNOSIS — Z6841 Body Mass Index (BMI) 40.0 and over, adult: Secondary | ICD-10-CM

## 2019-12-11 LAB — POCT URINALYSIS DIP (MANUAL ENTRY)
Bilirubin, UA: NEGATIVE
Glucose, UA: >=1000 mg/dL — AB
Ketones, POC UA: NEGATIVE mg/dL
Leukocytes, UA: NEGATIVE
Nitrite, UA: NEGATIVE
Protein Ur, POC: 30 mg/dL — AB
Spec Grav, UA: 1.02 (ref 1.010–1.025)
Urobilinogen, UA: 0.2 E.U./dL
pH, UA: 6 (ref 5.0–8.0)

## 2019-12-11 MED ORDER — PHENAZOPYRIDINE HCL 200 MG PO TABS: 200.0000 mg | ORAL_TABLET | Freq: Three times a day (TID) | ORAL | 0 refills | Status: DC | PRN

## 2019-12-11 MED ORDER — CEFUROXIME AXETIL 500 MG PO TABS: 500.0000 mg | ORAL_TABLET | Freq: Two times a day (BID) | ORAL | 0 refills | Status: AC

## 2019-12-11 NOTE — Progress Notes (Signed)
Rachel Luna 64 y.o.   Chief Complaint  Patient presents with  . Dysuria    per patient started 2 days after finishing medication  . Urinary Frequency    per patient with an odor and urine sometimes is a greenish gray color  . Edema    per patient swelling of ankles and feet since 11/20/2019    HISTORY OF PRESENT ILLNESS: This is a 64 y.o. female complaining of persistent UTI symptoms. Recently seen by me 11/20/2019 and started on Bactrim DS.  Culture done in the emergency department grew E. coli resistant to amoxicillin. Last culture done here in the office showed mixed urogenital flora.  Patient was treated for partially treated UTI.  Did well but symptoms started 2 days after stopping antibiotic.  Complaining of urinary frequency and burning with foul smell and sometimes greenish-gray colored urine.  Denies fever or chills.  Able to eat and drink.  Denies nausea or vomiting.  Denies flank pain or gross hematuria.  Also noted mild swelling of both ankles. No other associated symptoms. Patient has history of diabetes, hypertension, and dyslipidemia, on multiple medications. Fully vaccinated against Covid.  HPI   Prior to Admission medications   Medication Sig Start Date End Date Taking? Authorizing Provider  amLODipine-valsartan (EXFORGE) 5-160 MG tablet Take 1 tablet by mouth daily. 11/20/19 02/18/20 Yes Malaiya Paczkowski, Ines Bloomer, MD  baclofen (LIORESAL) 10 MG tablet Take 1 tablet (10 mg total) by mouth 3 (three) times daily. 08/28/19  Yes Suzzanne Cloud, NP  carbamazepine (TEGRETOL) 200 MG tablet Take 0.5 tablets (100 mg total) by mouth daily. 08/28/19  Yes Suzzanne Cloud, NP  cetirizine (ZYRTEC) 10 MG tablet Take 10 mg by mouth daily.   Yes [provider]  clonazePAM (KLONOPIN) 0.5 MG tablet Take 1 tablet (0.5 mg total) by mouth at bedtime. 10/16/19  Yes Suzzanne Cloud, NP  dapagliflozin propanediol (FARXIGA) 5 MG TABS tablet Take 1 tablet (5 mg total) by mouth daily before  breakfast. 11/07/19 02/05/20 Yes Zanylah Hardie, Ines Bloomer, MD  DULoxetine (CYMBALTA) 30 MG capsule Take 1 capsule (30 mg total) by mouth daily. To be combined with 60 mg 11/21/19  Yes Eappen, Ria Clock, MD  DULoxetine (CYMBALTA) 60 MG capsule Take 1 capsule (60 mg total) by mouth daily. To be combined with 30 mg 11/21/19  Yes Eappen, Ria Clock, MD  estradiol (ESTRACE) 0.5 MG tablet Take 1 tablet (0.5 mg total) by mouth daily. 12/26/18  Yes Shelly Bombard, MD  gabapentin (NEURONTIN) 800 MG tablet Take 1 tablet (800 mg total) by mouth 3 (three) times daily. 11/22/19  Yes Greene Diodato, Ines Bloomer, MD  meloxicam (MOBIC) 15 MG tablet Take 15 mg daily by mouth.  07/04/15  Yes [provider]  pramipexole (MIRAPEX) 0.75 MG tablet One tablet three times a day 10/23/19  Yes Kathrynn Ducking, MD  ACCU-CHEK AVIVA PLUS test strip Use in the morning and after meals as needed. 07/13/18   Horald Pollen, MD  glimepiride (AMARYL) 4 MG tablet Take 1 tablet (4 mg total) by mouth daily with breakfast. 04/18/19 08/08/19  Horald Pollen, MD  rosuvastatin (CRESTOR) 40 MG tablet Take 1 tablet (40 mg total) by mouth daily. 04/18/19 08/08/19  Horald Pollen, MD  sitaGLIPtin (JANUVIA) 100 MG tablet Take 1 tablet (100 mg total) by mouth daily. 08/08/19 11/06/19  Horald Pollen, MD    Allergies  Allergen Reactions  . Belsomra [Suvorexant] Other (See Comments)    Vivid dreams  .  Wellbutrin [Bupropion] Other (See Comments)    Irritability and Agitation  . Geodon [Ziprasidone Hcl] Other (See Comments)    Worsened Restless Leg Syndrome  . Lisinopril Cough  . Neupro [Rotigotine] Rash    Patient Active Problem List   Diagnosis Date Noted  . Aortic atherosclerosis ([place]) 11/20/2019  . E. coli UTI 11/20/2019  . Hypokalemia 11/20/2019  . Restless leg syndrome 02/15/2019  . Bipolar I disorder, most recent episode mixed ([place]) 09/14/2018  . GAD (generalized anxiety disorder) 09/14/2018  . Insomnia due to mental  disorder 09/14/2018  . Dyslipidemia associated with type 2 diabetes mellitus ([place]) 07/13/2018  . Seizure disorder ([place]) 10/19/2017  . 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 ANA positive 03/05/2015  . Elevated rheumatoid factor 03/05/2015  . Hypertension associated with diabetes ([place]) 09/04/2014  . Avitaminosis D 09/04/2014  . Chronic low back pain 02/18/2014  . Menopausal hot flushes 08/14/2012  . Arthritis of knee, degenerative 04/06/2011  . Benign essential HTN 07/13/2010  . Essential (primary) hypertension 07/13/2010  . Insomnia, unspecified 07/13/2010  . Chronic pain 01/08/2010  . Affective disorder, major 11/05/2009  . Major depressive disorder 11/05/2009  . Major depressive disorder, single episode, unspecified 11/05/2009  . Allergic rhinitis 07/03/2009    Past Medical History:  Diagnosis Date  . Anxiety   . Arthritis   . Bipolar disorder ([place])   . chronic low back pain   . Chronic low back pain 02/18/2014  . Depression   . Diabetes mellitus without complication ([place])   . Diabetes mellitus, type II ([place])   . Fibroids   . GERD (gastroesophageal reflux disease)   . Gout   . Hyperlipidemia   . Hypertension   . Obesity     Past Surgical History:  Procedure Laterality Date  . ABDOMINAL HYSTERECTOMY    . CHOLECYSTECTOMY    . FOOT SURGERY Left   . KNEE ARTHROPLASTY    . OVARIAN CYST REMOVAL    . SPINE SURGERY      Social History   Socioeconomic History  . Marital status: Divorced    Spouse name: Not on file  . Number of children: 0  . Years of education: 10  . Highest education level: 10th grade  Occupational History    Employer: UNEMPLOYED  Tobacco Use  . Smoking status: Passive Smoke Exposure - Never Smoker  . Smokeless tobacco: Never Used  . Tobacco comment: room mate smokes  Vaping Use  . Vaping Use: Never used  Substance and Sexual Activity  . Alcohol use: No    Alcohol/week: 0.0 standard drinks  . Drug use: No  . Sexual activity: Not  Currently  Other Topics Concern  . Not on file  Social History Narrative   Patient is divorced and lives with a roommate.   Patient is on disability.   Patient has a 10th grade education.   Patient is right-handed.   Patient drinks 6 sodas daily.         Social Determinants of Health   Financial Resource Strain:   . Difficulty of Paying Living Expenses: Not on file  Food Insecurity:   . Worried About Charity fundraiser in the Last Year: Not on file  . Ran Out of Food in the Last Year: Not on file  Transportation Needs:   . Lack of Transportation (Medical): Not on file  . Lack of Transportation (Non-Medical): Not on file  Physical Activity:   . Days of Exercise per Week: Not on  file  . Minutes of Exercise per Session: Not on file  Stress:   . Feeling of Stress : Not on file  Social Connections:   . Frequency of Communication with Friends and Family: Not on file  . Frequency of Social Gatherings with Friends and Family: Not on file  . Attends Religious Services: Not on file  . Active Member of Clubs or Organizations: Not on file  . Attends Archivist Meetings: Not on file  . Marital Status: Not on file  Intimate Partner Violence:   . Fear of Current or Ex-Partner: Not on file  . Emotionally Abused: Not on file  . Physically Abused: Not on file  . Sexually Abused: Not on file    Family History  Problem Relation Age of Onset  . Diabetes Mother   . Cancer Mother        breast  . Heart disease Mother   . Mental illness Mother        bipolar disorder  . Stroke Mother   . Breast cancer Mother   . Hypertension Mother   . High Cholesterol Mother   . Diabetes Father   . Hypertension Father   . Diabetes Brother   . Bipolar disorder Brother   . Diabetes Brother   . Breast cancer Maternal Aunt   . Breast cancer Maternal Grandmother      Review of Systems  Constitutional: Negative.  Negative for chills and fever.  HENT: Negative.  Negative for congestion  and sore throat.   Respiratory: Negative.  Negative for cough and shortness of breath.   Cardiovascular: Negative.  Negative for chest pain and palpitations.  Gastrointestinal: Negative.  Negative for abdominal pain, diarrhea, nausea and vomiting.  Genitourinary: Positive for dysuria, frequency and urgency. Negative for flank pain and hematuria.  Musculoskeletal: Negative.  Negative for myalgias.  Skin: Negative.  Negative for rash.  Neurological: Negative for dizziness and headaches.  All other systems reviewed and are negative.    Today's Vitals   12/11/19 1110  BP: 127/73  Pulse: 70  Resp: 16  Temp: 98 F (36.7 C)  TempSrc: Temporal  SpO2: 96%  Weight: 289 lb (131.1 kg)  Height: 5' 4.5" (1.638 m)   Body mass index is 48.84 kg/m. Wt Readings from Last 3 Encounters:  12/11/19 289 lb (131.1 kg)  11/20/19 274 lb (124.3 kg)  11/13/19 287 lb (130.2 kg)   Lab Results  Component Value Date   HGBA1C 10.7 (A) 11/07/2019     Physical Exam Vitals reviewed.  Constitutional:      Appearance: Normal appearance. She is obese.  HENT:     Head: Normocephalic.  Eyes:     Extraocular Movements: Extraocular movements intact.  Cardiovascular:     Rate and Rhythm: Normal rate.  Pulmonary:     Effort: Pulmonary effort is normal.  Abdominal:     Palpations: Abdomen is soft.     Tenderness: There is no abdominal tenderness. There is no right CVA tenderness or left CVA tenderness.  Musculoskeletal:        General: Normal range of motion.     Cervical back: Normal range of motion.     Right lower leg: No edema.     Left lower leg: No edema.  Skin:    General: Skin is warm and dry.     Capillary Refill: Capillary refill takes less than 2 seconds.  Neurological:     General: No focal deficit present.  Mental Status: She is alert and oriented to person, place, and time.  Psychiatric:        Mood and Affect: Mood normal.        Behavior: Behavior normal.    Results for orders  placed or performed in visit on 12/11/19 (from the past 24 hour(s))  POCT urinalysis dipstick     Status: Abnormal   Collection Time: 12/11/19 11:33 AM  Result Value Ref Range   Color, UA yellow yellow   Clarity, UA cloudy (A) clear   Glucose, UA >=1,000 (A) negative mg/dL   Bilirubin, UA negative negative   Ketones, POC UA negative negative mg/dL   Spec Grav, UA 1.020 1.010 - 1.025   Blood, UA moderate (A) negative   pH, UA 6.0 5.0 - 8.0   Protein Ur, POC =30 (A) negative mg/dL   Urobilinogen, UA 0.2 0.2 or 1.0 E.U./dL   Nitrite, UA Negative Negative   Leukocytes, UA Negative Negative     ASSESSMENT & PLAN: Kelita was seen today for dysuria, urinary frequency and edema.  Diagnoses and all orders for this visit:  Urinary frequency -     POCT Microscopic Urinalysis ([facility]) -     POCT urinalysis dipstick -     Urine Culture  Dysuria -     POCT Microscopic Urinalysis ([facility]) -     POCT urinalysis dipstick -     Urine Culture -     phenazopyridine (PYRIDIUM) 200 MG tablet; Take 1 tablet (200 mg total) by mouth 3 (three) times daily as needed for pain.  Hypertension associated with diabetes ([place]) -     Comprehensive metabolic panel -     HM Diabetes Foot Exam  Dyslipidemia associated with type 2 diabetes mellitus ([facility])  Body mass index (BMI) of 45.0-49.9 in adult [facility])  Morbid obesity ([facility])  Acute UTI -     cefUROXime (CEFTIN) 500 MG tablet; Take 1 tablet (500 mg total) by mouth 2 (two) times daily with a meal for 7 days. -     CBC with Differential/Platelet  Ankle edema, bilateral -     Comprehensive metabolic panel -     CBC with Differential/Platelet     Patient Instructions       If you have lab work done today you will be contacted with your lab results within the next 2 weeks.  If you have not heard from [place] then please contact us. The fastest way to get your results is to register for My Chart.   IF you received an x-ray today, you will receive an  invoice from [facility]. Please contact [facility] at [phone] with questions or concerns regarding your invoice.   IF you received labwork today, you will receive an invoice from [facility]. Please contact [facility] at [phone] with questions or concerns regarding your invoice.   Our billing staff will not be able to assist you with questions regarding bills from these companies.  You will be contacted with the lab results as soon as they are available. The fastest way to get your results is to activate your My Chart account. Instructions are located on the last page of this paperwork. If you have not heard from [place] regarding the results in 2 weeks, please contact this office.     Urinary Tract Infection, Adult A urinary tract infection (UTI) is an infection of any part of the urinary tract. The urinary tract includes:  The kidneys.  The ureters.  The bladder.  The urethra. These organs make, store, and get rid of pee (urine) in the body. What are the causes? This is caused by germs (bacteria) in your genital area. These germs grow and cause swelling (inflammation) of your urinary tract. What increases the risk? You are more likely to develop this condition if:  You have a small, thin tube (catheter) to drain pee.  You cannot control when you pee or poop (incontinence).  You are female, and: ? You use these methods to prevent pregnancy:  A medicine that kills sperm (spermicide).  A device that blocks sperm (diaphragm). ? You have low levels of a female hormone (estrogen). ? You are pregnant.  You have genes that add to your risk.  You are sexually active.  You take antibiotic medicines.  You have trouble peeing because of: ? A prostate that is bigger than normal, if you are female. ? A blockage in the part of your body that drains pee from the bladder (urethra). ? A kidney stone. ? A nerve condition that affects your bladder (neurogenic  bladder). ? Not getting enough to drink. ? Not peeing often enough.  You have other conditions, such as: ? Diabetes. ? A weak disease-fighting system (immune system). ? Sickle cell disease. ? Gout. ? Injury of the spine. What are the signs or symptoms? Symptoms of this condition include:  Needing to pee right away (urgently).  Peeing often.  Peeing small amounts often.  Pain or burning when peeing.  Blood in the pee.  Pee that smells bad or not like normal.  Trouble peeing.  Pee that is cloudy.  Fluid coming from the vagina, if you are female.  Pain in the belly or lower back. Other symptoms include:  Throwing up (vomiting).  No urge to eat.  Feeling mixed up (confused).  Being tired and grouchy (irritable).  A fever.  Watery poop (diarrhea). How is this treated? This condition may be treated with:  Antibiotic medicine.  Other medicines.  Drinking enough water. Follow these instructions at home:  Medicines  Take over-the-counter and prescription medicines only as told by your doctor.  If you were prescribed an antibiotic medicine, take it as told by your doctor. Do not stop taking it even if you start to feel better. General instructions  Make sure you: ? Pee until your bladder is empty. ? Do not hold pee for a long time. ? Empty your bladder after sex. ? Wipe from front to back after pooping if you are a female. Use each tissue one time when you wipe.  Drink enough fluid to keep your pee pale yellow.  Keep all follow-up visits as told by your doctor. This is important. Contact a doctor if:  You do not get better after 1-2 days.  Your symptoms go away and then come back. Get help right away if:  You have very bad back pain.  You have very bad pain in your lower belly.  You have a fever.  You are sick to your stomach (nauseous).  You are throwing up. Summary  A urinary tract infection (UTI) is an infection of any part of the  urinary tract.  This condition is caused by germs in your genital area.  There are many risk factors for a UTI. These include having a small, thin tube to drain pee and not being able to control when you pee or poop.  Treatment includes antibiotic medicines for germs.  Drink enough fluid to keep your pee pale yellow.  This information is not intended to replace advice given to you by your health care provider. Make sure you discuss any questions you have with your health care provider. Document Revised: 02/09/2018 Document Reviewed: 09/01/2017 Elsevier Patient Education  2020 Elsevier Inc.      Agustina Caroli, MD [facility]

## 2019-12-11 NOTE — Patient Instructions (Addendum)
   If you have lab work done today you will be contacted with your lab results within the next 2 weeks.  If you have not heard from us then please contact us. The fastest way to get your results is to register for My Chart.   IF you received an x-ray today, you will receive an invoice from [facility]. Please contact  Radiology at [phone] with questions or concerns regarding your invoice.   IF you received labwork today, you will receive an invoice from [facility]. Please contact [facility] at [phone] with questions or concerns regarding your invoice.   Our billing staff will not be able to assist you with questions regarding bills from these companies.  You will be contacted with the lab results as soon as they are available. The fastest way to get your results is to activate your My Chart account. Instructions are located on the last page of this paperwork. If you have not heard from us regarding the results in 2 weeks, please contact this office.     Urinary Tract Infection, Adult A urinary tract infection (UTI) is an infection of any part of the urinary tract. The urinary tract includes:  The kidneys.  The ureters.  The bladder.  The urethra. These organs make, store, and get rid of pee (urine) in the body. What are the causes? This is caused by germs (bacteria) in your genital area. These germs grow and cause swelling (inflammation) of your urinary tract. What increases the risk? You are more likely to develop this condition if:  You have a small, thin tube (catheter) to drain pee.  You cannot control when you pee or poop (incontinence).  You are female, and: ? You use these methods to prevent pregnancy:  A medicine that kills sperm (spermicide).  A device that blocks sperm (diaphragm). ? You have low levels of a female hormone (estrogen). ? You are pregnant.  You have genes that add to your risk.  You are sexually active.  You take  antibiotic medicines.  You have trouble peeing because of: ? A prostate that is bigger than normal, if you are female. ? A blockage in the part of your body that drains pee from the bladder (urethra). ? A kidney stone. ? A nerve condition that affects your bladder (neurogenic bladder). ? Not getting enough to drink. ? Not peeing often enough.  You have other conditions, such as: ? Diabetes. ? A weak disease-fighting system (immune system). ? Sickle cell disease. ? Gout. ? Injury of the spine. What are the signs or symptoms? Symptoms of this condition include:  Needing to pee right away (urgently).  Peeing often.  Peeing small amounts often.  Pain or burning when peeing.  Blood in the pee.  Pee that smells bad or not like normal.  Trouble peeing.  Pee that is cloudy.  Fluid coming from the vagina, if you are female.  Pain in the belly or lower back. Other symptoms include:  Throwing up (vomiting).  No urge to eat.  Feeling mixed up (confused).  Being tired and grouchy (irritable).  A fever.  Watery poop (diarrhea). How is this treated? This condition may be treated with:  Antibiotic medicine.  Other medicines.  Drinking enough water. Follow these instructions at home:  Medicines  Take over-the-counter and prescription medicines only as told by your doctor.  If you were prescribed an antibiotic medicine, take it as told by your doctor. Do not stop taking it even if   you start to feel better. General instructions  Make sure you: ? Pee until your bladder is empty. ? Do not hold pee for a long time. ? Empty your bladder after sex. ? Wipe from front to back after pooping if you are a female. Use each tissue one time when you wipe.  Drink enough fluid to keep your pee pale yellow.  Keep all follow-up visits as told by your doctor. This is important. Contact a doctor if:  You do not get better after 1-2 days.  Your symptoms go away and then come  back. Get help right away if:  You have very bad back pain.  You have very bad pain in your lower belly.  You have a fever.  You are sick to your stomach (nauseous).  You are throwing up. Summary  A urinary tract infection (UTI) is an infection of any part of the urinary tract.  This condition is caused by germs in your genital area.  There are many risk factors for a UTI. These include having a small, thin tube to drain pee and not being able to control when you pee or poop.  Treatment includes antibiotic medicines for germs.  Drink enough fluid to keep your pee pale yellow. This information is not intended to replace advice given to you by your health care provider. Make sure you discuss any questions you have with your health care provider. Document Revised: 02/09/2018 Document Reviewed: 09/01/2017 Elsevier Patient Education  2020 Elsevier Inc.  

## 2019-12-12 LAB — COMPREHENSIVE METABOLIC PANEL
ALT: 39 IU/L — ABNORMAL HIGH (ref 0–32)
AST: 38 IU/L (ref 0–40)
Albumin/Globulin Ratio: 1.7 (ref 1.2–2.2)
Albumin: 4.3 g/dL (ref 3.8–4.8)
Alkaline Phosphatase: 112 IU/L (ref 44–121)
BUN/Creatinine Ratio: 11 — ABNORMAL LOW (ref 12–28)
BUN: 7 mg/dL — ABNORMAL LOW (ref 8–27)
Bilirubin Total: <0.2 mg/dL (ref 0.0–1.2)
CO2: 27 mmol/L (ref 20–29)
Calcium: 9.1 mg/dL (ref 8.7–10.3)
Chloride: 103 mmol/L (ref 96–106)
Creatinine, Ser: 0.64 mg/dL (ref 0.57–1.00)
GFR calc Af Amer: 111 mL/min/{1.73_m2} (ref >59)
GFR calc non Af Amer: 96 mL/min/{1.73_m2} (ref >59)
Globulin, Total: 2.5 g/dL (ref 1.5–4.5)
Glucose: 103 mg/dL — ABNORMAL HIGH (ref 65–99)
Potassium: 3.7 mmol/L (ref 3.5–5.2)
Sodium: 144 mmol/L (ref 134–144)
Total Protein: 6.8 g/dL (ref 6.0–8.5)

## 2019-12-12 LAB — CBC WITH DIFFERENTIAL/PLATELET
Basophils Absolute: 0.1 10*3/uL (ref 0.0–0.2)
Basos: 1 %
EOS (ABSOLUTE): 0.2 10*3/uL (ref 0.0–0.4)
Eos: 3 %
Hematocrit: 39.6 % (ref 34.0–46.6)
Hemoglobin: 12.5 g/dL (ref 11.1–15.9)
Immature Grans (Abs): 0.1 10*3/uL (ref 0.0–0.1)
Immature Granulocytes: 1 %
Lymphocytes Absolute: 2.3 10*3/uL (ref 0.7–3.1)
Lymphs: 30 %
MCH: 28.4 pg (ref 26.6–33.0)
MCHC: 31.6 g/dL (ref 31.5–35.7)
MCV: 90 fL (ref 79–97)
Monocytes Absolute: 0.6 10*3/uL (ref 0.1–0.9)
Monocytes: 8 %
Neutrophils Absolute: 4.5 10*3/uL (ref 1.4–7.0)
Neutrophils: 57 %
Platelets: 228 10*3/uL (ref 150–450)
RBC: 4.4 x10E6/uL (ref 3.77–5.28)
RDW: 13.8 % (ref 11.7–15.4)
WBC: 7.8 10*3/uL (ref 3.4–10.8)

## 2019-12-13 LAB — URINE CULTURE

## 2019-12-18 DIAGNOSIS — E119 Type 2 diabetes mellitus without complications: Secondary | ICD-10-CM | POA: Diagnosis not present

## 2019-12-18 LAB — HM DIABETES EYE EXAM

## 2019-12-19 ENCOUNTER — Other Ambulatory Visit: Payer: Self-pay | Admitting: Emergency Medicine

## 2019-12-19 DIAGNOSIS — E1165 Type 2 diabetes mellitus with hyperglycemia: Secondary | ICD-10-CM

## 2019-12-19 MED ORDER — GLIMEPIRIDE 4 MG PO TABS: 4.0000 mg | ORAL_TABLET | Freq: Every day | ORAL | 0 refills | Status: DC

## 2019-12-19 NOTE — Telephone Encounter (Signed)
Medication Refill - Medication: glimepiride (AMARYL) 4 MG tablet   Has the patient contacted their pharmacy?yes (Agent: If no, request that the patient contact the pharmacy for the refill.) (Agent: If yes, when and what did the pharmacy advise?)contact pcp  Preferred Pharmacy (with phone number or street name):  [place], [place] Phone:  [phone]  Fax:  [phone]       Agent: Please be advised that RX refills may take up to 3 business days. We ask that you follow-up with your pharmacy.

## 2020-01-03 ENCOUNTER — Encounter: Payer: Self-pay | Admitting: Psychiatry

## 2020-01-03 ENCOUNTER — Other Ambulatory Visit: Payer: Self-pay

## 2020-01-03 ENCOUNTER — Telehealth: Payer: Self-pay

## 2020-01-03 ENCOUNTER — Telehealth (INDEPENDENT_AMBULATORY_CARE_PROVIDER_SITE_OTHER): Payer: Medicare Other | Admitting: Psychiatry

## 2020-01-03 DIAGNOSIS — F411 Generalized anxiety disorder: Secondary | ICD-10-CM | POA: Diagnosis not present

## 2020-01-03 DIAGNOSIS — F5105 Insomnia due to other mental disorder: Secondary | ICD-10-CM

## 2020-01-03 DIAGNOSIS — R41840 Attention and concentration deficit: Secondary | ICD-10-CM | POA: Diagnosis not present

## 2020-01-03 DIAGNOSIS — F3178 Bipolar disorder, in full remission, most recent episode mixed: Secondary | ICD-10-CM | POA: Insufficient documentation

## 2020-01-03 DIAGNOSIS — F3161 Bipolar disorder, current episode mixed, mild: Secondary | ICD-10-CM | POA: Diagnosis not present

## 2020-01-03 NOTE — Progress Notes (Signed)
Virtual Visit via Video Note  I connected with Rachel Luna on 01/03/20 at  8:30 AM EDT by a video enabled telemedicine application and verified that I am speaking with the correct person using two identifiers.  Location Provider Location : [facility] Patient Location : Home  Participants: Patient , Provider   I discussed the limitations of evaluation and management by telemedicine and the availability of in person appointments. The patient expressed understanding and agreed to proceed.   I discussed the assessment and treatment plan with the patient. The patient was provided an opportunity to ask questions and all were answered. The patient agreed with the plan and demonstrated an understanding of the instructions.   The patient was advised to call back or seek an in-person evaluation if the symptoms worsen or if the condition fails to improve as anticipated.  Video connection was lost at less than 50% of the duration of the visit, at which time the remainder of the visit was completed through audio only     [facility] MD OP Progress Note  01/03/2020 1:55 PM Rachel Luna  MRN:  [id number]  Chief Complaint:  Chief Complaint    Follow-up     HPI: Rachel Luna is a 64 year old Caucasian female, divorced, disabled, lives in [place], has a history of bipolar disorder, GAD, insomnia, seizure disorder, morbid obesity, chronic pain, neuropathy was evaluated by telemedicine today.  Patient today appeared to be irritable, agitated and anxious in session.  Patient initially reported that she was overwhelmed and anxious mostly because of situational stressors.  She reports the current medications as not very beneficial for her current symptoms.  She takes Cymbalta.  She is also on Klonopin prescribed by her other provider.  Patient reports she lays on the couch all day and does not do much.  She also reports she has severe attention and focus problems that she is very disorganized, her house is a mess  and she is unable to do anything.  However patient reports she is not depressed .  She reports her inability to focus is making her depressed and not the other way around.  She also reports her inability to do anything is making her more anxious.  She hence wants to be treated for ADD.  Patient reports she has not been sleeping since the past few nights ever since her cat got sick.  She reports prior to that she was sleeping okay.  She takes melatonin and that does help.  Patient denies any suicidality, homicidality or perceptual disturbances.  Patient became upset during our conversation when Probation officer discussed making changes with her Cymbalta or adding another medication to help with her mood.  Writer attempted to educate patient however patient became more and more upset stating that writer has been trying to put her on new medications on the market and has been treating her like a [place] pig ever since she has been following up with Probation officer.  She also reports that she does not want to make any changes with any of her medications because everything has interaction with her current medications.  She wants to stay on the Klonopin and does not believe the Klonopin is affecting her memory.  She reports she is 64 years old and the life that she has left she wants to stay on the Klonopin since it helps her.    Visit Diagnosis:    ICD-10-CM   1. Bipolar 1 disorder, mixed, mild (HCC)  F31.61   2. GAD (  generalized anxiety disorder)  F41.1 TSH    T4+Free T4  3. Insomnia due to mental disorder  F51.05 TSH    T4+Free T4    Vitamin B12  4. Attention and concentration deficit  R41.840 TSH    T4+Free T4    T3 uptake    Vitamin B12    Past Psychiatric History: I have reviewed past psychiatric history from my progress note on 10/19/2017.  Past trials of Abilify, Klonopin, Elavil, Cymbalta, Tegretol, Effexor, trazodone, Belsomra  Past Medical History:  Past Medical History:  Diagnosis Date  . Anxiety   .  Arthritis   . Bipolar disorder ([place])   . chronic low back pain   . Chronic low back pain 02/18/2014  . Depression   . Diabetes mellitus without complication ([place])   . Diabetes mellitus, type II ([place])   . Fibroids   . GERD (gastroesophageal reflux disease)   . Gout   . Hyperlipidemia   . Hypertension   . Obesity     Past Surgical History:  Procedure Laterality Date  . ABDOMINAL HYSTERECTOMY    . CHOLECYSTECTOMY    . FOOT SURGERY Left   . KNEE ARTHROPLASTY    . OVARIAN CYST REMOVAL    . SPINE SURGERY      Family Psychiatric History: I have reviewed family psychiatric history from my progress note on 10/19/2017.  Family History:  Family History  Problem Relation Age of Onset  . Diabetes Mother   . Cancer Mother        breast  . Heart disease Mother   . Mental illness Mother        bipolar disorder  . Stroke Mother   . Breast cancer Mother   . Hypertension Mother   . High Cholesterol Mother   . Diabetes Father   . Hypertension Father   . Diabetes Brother   . Bipolar disorder Brother   . Diabetes Brother   . Breast cancer Maternal Aunt   . Breast cancer Maternal Grandmother     Social History: I have reviewed social history from my progress note on 10/19/2017. Social History   Socioeconomic History  . Marital status: Divorced    Spouse name: Not on file  . Number of children: 0  . Years of education: 10  . Highest education level: 10th grade  Occupational History    Employer: UNEMPLOYED  Tobacco Use  . Smoking status: Passive Smoke Exposure - Never Smoker  . Smokeless tobacco: Never Used  . Tobacco comment: room mate smokes  Vaping Use  . Vaping Use: Never used  Substance and Sexual Activity  . Alcohol use: No    Alcohol/week: 0.0 standard drinks  . Drug use: No  . Sexual activity: Not Currently  Other Topics Concern  . Not on file  Social History Narrative   Patient is divorced and lives with a roommate.   Patient is on disability.   Patient has a  10th grade education.   Patient is right-handed.   Patient drinks 6 sodas daily.         Social Determinants of Health   Financial Resource Strain:   . Difficulty of Paying Living Expenses: Not on file  Food Insecurity:   . Worried About Charity fundraiser in the Last Year: Not on file  . Ran Out of Food in the Last Year: Not on file  Transportation Needs:   . Lack of Transportation (Medical): Not on file  . Lack of Transportation (  Non-Medical): Not on file  Physical Activity:   . Days of Exercise per Week: Not on file  . Minutes of Exercise per Session: Not on file  Stress:   . Feeling of Stress : Not on file  Social Connections:   . Frequency of Communication with Friends and Family: Not on file  . Frequency of Social Gatherings with Friends and Family: Not on file  . Attends Religious Services: Not on file  . Active Member of Clubs or Organizations: Not on file  . Attends Archivist Meetings: Not on file  . Marital Status: Not on file    Allergies:  Allergies  Allergen Reactions  . Belsomra [Suvorexant] Other (See Comments)    Vivid dreams  . Wellbutrin [Bupropion] Other (See Comments)    Irritability and Agitation  . Geodon [Ziprasidone Hcl] Other (See Comments)    Worsened Restless Leg Syndrome  . Lisinopril Cough  . Neupro [Rotigotine] Rash    Metabolic Disorder Labs: Lab Results  Component Value Date   HGBA1C 10.7 (A) 11/07/2019   No results found for: PROLACTIN Lab Results  Component Value Date   CHOL 144 11/07/2019   TRIG 165 (H) 11/07/2019   HDL 52 11/07/2019   CHOLHDL 2.8 11/07/2019   VLDL 41 (H) 03/25/2014   LDLCALC 64 11/07/2019   LDLCALC 89 08/08/2019   Lab Results  Component Value Date   TSH 2.030 07/13/2018    Therapeutic Level Labs: No results found for: LITHIUM No results found for: VALPROATE No components found for:  CBMZ  Current Medications: Current Outpatient Medications  Medication Sig Dispense Refill  .  ACCU-CHEK AVIVA PLUS test strip Use in the morning and after meals as needed. 100 each 5  . amLODipine-valsartan (EXFORGE) 5-160 MG tablet Take 1 tablet by mouth daily. 90 tablet 3  . baclofen (LIORESAL) 10 MG tablet Take 1 tablet (10 mg total) by mouth 3 (three) times daily. 270 each 1  . carbamazepine (TEGRETOL) 200 MG tablet Take 0.5 tablets (100 mg total) by mouth daily. 45 tablet 1  . cetirizine (ZYRTEC) 10 MG tablet Take 10 mg by mouth daily.    . clonazePAM (KLONOPIN) 0.5 MG tablet Take 1 tablet (0.5 mg total) by mouth at bedtime. 30 tablet 3  . dapagliflozin propanediol (FARXIGA) 5 MG TABS tablet Take 1 tablet (5 mg total) by mouth daily before breakfast. 90 tablet 3  . DULoxetine (CYMBALTA) 30 MG capsule Take 1 capsule (30 mg total) by mouth daily. To be combined with 60 mg 90 capsule 1  . DULoxetine (CYMBALTA) 60 MG capsule Take 1 capsule (60 mg total) by mouth daily. To be combined with 30 mg 90 capsule 1  . estradiol (ESTRACE) 0.5 MG tablet Take 1 tablet (0.5 mg total) by mouth daily. 30 tablet 11  . gabapentin (NEURONTIN) 800 MG tablet Take 1 tablet (800 mg total) by mouth 3 (three) times daily. 270 tablet 1  . glimepiride (AMARYL) 4 MG tablet Take 1 tablet (4 mg total) by mouth daily with breakfast. 90 tablet 0  . phenazopyridine (PYRIDIUM) 200 MG tablet Take 1 tablet (200 mg total) by mouth 3 (three) times daily as needed for pain. 10 tablet 0  . pramipexole (MIRAPEX) 0.75 MG tablet One tablet three times a day 270 tablet 1  . rosuvastatin (CRESTOR) 40 MG tablet Take 1 tablet (40 mg total) by mouth daily. 90 tablet 3  . sitaGLIPtin (JANUVIA) 100 MG tablet Take 1 tablet (100 mg total) by mouth  daily. 90 tablet 1  . losartan-hydrochlorothiazide (HYZAAR) 100-25 MG tablet Take 1 tablet by mouth daily. (Patient not taking: Reported on 01/03/2020)    . meloxicam (MOBIC) 15 MG tablet Take 15 mg daily by mouth.  (Patient not taking: Reported on 01/03/2020)     No current  facility-administered medications for this visit.     Musculoskeletal: Strength & Muscle Tone: UTA Gait & Station: UTA Patient leans: N/A  Psychiatric Specialty Exam: Review of Systems  Psychiatric/Behavioral: Positive for decreased concentration and sleep disturbance. The patient is nervous/anxious.   All other systems reviewed and are negative.   There were no vitals taken for this visit.There is no height or weight on file to calculate BMI.  General Appearance: UTA  Eye Contact:  UTA  Speech:  Clear and Coherent  Volume:  Increased  Mood:  Angry, Anxious and Irritable  Affect:  Labile  Thought Process:  Goal Directed and Descriptions of Associations: Circumstantial  Orientation:  Full (Time, Place, and Person)  Thought Content: Logical   Suicidal Thoughts:  No  Homicidal Thoughts:  No  Memory:  Immediate;   Fair Recent;   Fair Remote;   Fair  Judgement:  Fair  Insight:  Shallow  Psychomotor Activity:  Increased  Concentration:  Concentration: Fair and Attention Span: Fair  Recall:  [employer] of Knowledge: Fair  Language: Fair  Akathisia:  No  Handed:  Right  AIMS (if indicated): UTA  Assets:  Communication Skills Desire for Improvement Housing  ADL's:  Intact  Cognition: WNL  Sleep:  Over all OK, but restless since past few nights due to her cat being sick   Screenings: PHQ2-9     Office Visit from 12/11/2019 in Primary Care at [place] from 11/20/2019 in Primary [facility] at [place] from 08/08/2019 in Primary Care at [place] from 05/02/2019 in [place] at  from 04/18/2019 in [place] at [facility]  PHQ-2 Total Score 0 0 4 6 0  PHQ-9 Total Score -- -- 16 22 --       Assessment and Plan: Rachel Luna is a 64 year old Caucasian female, divorced, disabled, lives in [place], has a history of bipolar disorder, anxiety disorder, insomnia, seizure disorder, neuropathy, chronic pain, morbid obesity was evaluated by  telemedicine today.  Patient is currently struggling with anxiety, concentration problems.  Patient however declines any medication changes and believes her problems are due to ADD.  Discussed plan as noted below.  Plan Bipolar disorder-unstable Cymbalta 90 mg p.o. daily Tegretol 100 mg p.o. daily-prescribed by neurology for seizures. Patient does report attention and focus problems, as well as anxiety, unknown if this is due to her bipolar symptoms.  Patient however declines any medication changes.  Anxiety disorder-unstable Continue CBT Continue current medications. Patient declines any medication changes.  Insomnia-stable Continue melatonin as needed  Attention and concentration problems-will order the following labs.  Patient had thyroid abnormalities a year ago.  Will order thyroid panel.  We will also order vitamin B12.  After reviewing her labs will make a decision whether she will need neuropsychology testing referral.  Writer attempted to reassure patient,  attempted to educate patient about her current symptoms as well as the need for further medication changes.  Patient however accused Probation officer of trying to make changes with her medications and treating her like a [place] pig.  Patient also believes staying on benzodiazepines is the right treatment for her and does not want to make any changes.  Writer discussed with patient about referral back to primary care provider or other providers in the community if patient believes writer is unable to address her problems.  Patient however at that calmed down and reported that she was willing to get the labs done and also get the testing done.  Also discussed with patient that Probation officer and her therapist [name] has been coordinating care. Contacted [name] and discussed concerns, patient's need for education about making medication changes, staying compliant.  Follow-up in clinic as needed.  I have spent atleast 30 minutes  non face to face with patient today. More than 50 % of the time was spent for preparing to see the patient ( e.g., review of test, records ),ordering medications and test ,psychoeducation and supportive psychotherapy and care coordination,as well as documenting clinical information in electronic health record. This note was generated in part or whole with voice recognition software. Voice recognition is usually quite accurate but there are transcription errors that can and very often do occur. I apologize for any typographical errors that were not detected and corrected.      Ursula Alert, MD 01/03/2020, 1:55 PM

## 2020-01-03 NOTE — Telephone Encounter (Signed)
faxed and confirmed labwork sent to [facility].  tsh, t4 free t4, t3 uptake , and vitamin b12;   dx f41.1, f51.05, r41.840

## 2020-01-23 ENCOUNTER — Encounter: Payer: Self-pay | Admitting: Registered Nurse

## 2020-01-23 ENCOUNTER — Other Ambulatory Visit: Payer: Self-pay

## 2020-01-23 ENCOUNTER — Ambulatory Visit (INDEPENDENT_AMBULATORY_CARE_PROVIDER_SITE_OTHER): Payer: Medicare Other | Admitting: Registered Nurse

## 2020-01-23 VITALS — BP 144/84 | HR 98 | Temp 97.9°F | Resp 18 | Ht 64.5 in | Wt 280.4 lb

## 2020-01-23 DIAGNOSIS — L97411 Non-pressure chronic ulcer of right heel and midfoot limited to breakdown of skin: Secondary | ICD-10-CM | POA: Diagnosis not present

## 2020-01-23 DIAGNOSIS — E11621 Type 2 diabetes mellitus with foot ulcer: Secondary | ICD-10-CM

## 2020-01-23 MED ORDER — TRAMADOL HCL 50 MG PO TABS: 50.0000 mg | ORAL_TABLET | Freq: Two times a day (BID) | ORAL | 0 refills | Status: AC | PRN

## 2020-01-23 MED ORDER — DOXYCYCLINE HYCLATE 100 MG PO TABS: 100.0000 mg | ORAL_TABLET | Freq: Two times a day (BID) | ORAL | 0 refills | Status: DC

## 2020-01-23 NOTE — Patient Instructions (Signed)
° ° ° °  If you have lab work done today you will be contacted with your lab results within the next 2 weeks.  If you have not heard from us then please contact us. The fastest way to get your results is to register for My Chart. ° ° °IF you received an x-ray today, you will receive an invoice from [facility]. Please contact [facility] at [phone] with questions or concerns regarding your invoice.  ° °IF you received labwork today, you will receive an invoice from [facility]. Please contact [facility] at [phone] with questions or concerns regarding your invoice.  ° °Our billing staff will not be able to assist you with questions regarding bills from these companies. ° °You will be contacted with the lab results as soon as they are available. The fastest way to get your results is to activate your My Chart account. Instructions are located on the last page of this paperwork. If you have not heard from us regarding the results in 2 weeks, please contact this office. °  ° ° ° °

## 2020-02-04 ENCOUNTER — Encounter: Payer: Medicare Other | Attending: Physician Assistant | Admitting: Physician Assistant

## 2020-02-04 ENCOUNTER — Other Ambulatory Visit: Payer: Self-pay

## 2020-02-04 DIAGNOSIS — Z809 Family history of malignant neoplasm, unspecified: Secondary | ICD-10-CM | POA: Diagnosis not present

## 2020-02-04 DIAGNOSIS — Z833 Family history of diabetes mellitus: Secondary | ICD-10-CM | POA: Diagnosis not present

## 2020-02-04 DIAGNOSIS — G8250 Quadriplegia, unspecified: Secondary | ICD-10-CM | POA: Insufficient documentation

## 2020-02-04 DIAGNOSIS — I10 Essential (primary) hypertension: Secondary | ICD-10-CM | POA: Diagnosis not present

## 2020-02-04 DIAGNOSIS — Z841 Family history of disorders of kidney and ureter: Secondary | ICD-10-CM | POA: Insufficient documentation

## 2020-02-04 DIAGNOSIS — E11621 Type 2 diabetes mellitus with foot ulcer: Secondary | ICD-10-CM | POA: Insufficient documentation

## 2020-02-04 DIAGNOSIS — E1151 Type 2 diabetes mellitus with diabetic peripheral angiopathy without gangrene: Secondary | ICD-10-CM | POA: Insufficient documentation

## 2020-02-04 DIAGNOSIS — L97512 Non-pressure chronic ulcer of other part of right foot with fat layer exposed: Secondary | ICD-10-CM | POA: Insufficient documentation

## 2020-02-04 DIAGNOSIS — Z823 Family history of stroke: Secondary | ICD-10-CM | POA: Diagnosis not present

## 2020-02-04 DIAGNOSIS — M1990 Unspecified osteoarthritis, unspecified site: Secondary | ICD-10-CM | POA: Insufficient documentation

## 2020-02-04 DIAGNOSIS — Z888 Allergy status to other drugs, medicaments and biological substances status: Secondary | ICD-10-CM | POA: Diagnosis not present

## 2020-02-04 DIAGNOSIS — Z8249 Family history of ischemic heart disease and other diseases of the circulatory system: Secondary | ICD-10-CM | POA: Diagnosis not present

## 2020-02-04 DIAGNOSIS — L97412 Non-pressure chronic ulcer of right heel and midfoot with fat layer exposed: Secondary | ICD-10-CM | POA: Diagnosis not present

## 2020-02-04 DIAGNOSIS — Z9049 Acquired absence of other specified parts of digestive tract: Secondary | ICD-10-CM | POA: Insufficient documentation

## 2020-02-04 NOTE — Progress Notes (Signed)
Rachel Luna, Rachel Luna ([id number]) Visit Report for 02/04/2020 Abuse/Suicide Risk Screen Details Patient Name: Rachel Luna, Rachel Luna. Date of Service: 02/04/2020 9:15 AM Medical Record Number: [id number] Patient Account Number: [id number] Date of Birth/Sex: [birth year] (64 y.o. F) Treating RN: Dolan Amen Primary Care Lendell Gallick: Agustina Caroli Other Clinician: Referring Valgene Deloatch: Agustina Caroli Treating Allysen Lazo/Extender: Skipper Cliche in Treatment: 0 Abuse/Suicide Risk Screen Items Answer ABUSE RISK SCREEN: Has anyone close to you tried to hurt or harm you recentlyo No Do you feel uncomfortable with anyone in your familyo No Has anyone forced you do things that you didnot want to doo No Electronic Signature(s) Signed: 02/04/2020 3:30:56 PM By: Georges Mouse, Minus Breeding Entered By: Georges Mouse, Minus Breeding on 02/04/2020 09:30:18 Rachel Luna ([id number]) -------------------------------------------------------------------------------- Activities of Daily Living Details Patient Name: Rachel Luna. Date of Service: 02/04/2020 9:15 AM Medical Record Number: [id number] Patient Account Number: [id number] Date of Birth/Sex: 02-25-1956 (64 y.o. F) Treating RN: Dolan Amen Primary Care Ajanay Farve: Agustina Caroli Other Clinician: Referring Berenis Corter: Agustina Caroli Treating Terriana Barreras/Extender: Skipper Cliche in Treatment: 0 Activities of Daily Living Items Answer Activities of Daily Living (Please select one for each item) Drive Automobile Completely Able Take Medications Completely Able Use Telephone Completely Able Care for Appearance Completely Able Use Toilet Completely Able Bath / Shower Completely Able Dress Self Completely Able Feed Self Completely Able Walk Completely Able Get In / Out Bed Completely Able Housework Completely Able Prepare Meals Completely Able Handle Money Completely Able Shop for Self Completely Able Electronic Signature(s) Signed: 02/04/2020 3:30:56  PM By: Georges Mouse, Minus Breeding Entered By: Georges Mouse, Minus Breeding on 02/04/2020 09:31:03 Rachel Luna ([id number]) -------------------------------------------------------------------------------- Education Screening Details Patient Name: Rachel Luna. Date of Service: 02/04/2020 9:15 AM Medical Record Number: [id number] Patient Account Number: [id number] Date of Birth/Sex: 12-Apr-1955 (64 y.o. F) Treating RN: Dolan Amen Primary Care Ramsey Midgett: Agustina Caroli Other Clinician: Referring Danae Oland: Agustina Caroli Treating Chesney Suares/Extender: Skipper Cliche in Treatment: 0 Primary Learner Assessed: Patient Learning Preferences/Education Level/Primary Language Learning Preference: Explanation, Demonstration Highest Education Level: High School Preferred Language: English Cognitive Barrier Language Barrier: No Translator Needed: No Memory Deficit: No Emotional Barrier: No Cultural/Religious Beliefs Affecting Medical Care: No Physical Barrier Impaired Vision: Yes Glasses Impaired Hearing: No Decreased Hand dexterity: No Knowledge/Comprehension Knowledge Level: Medium Comprehension Level: Medium Ability to understand written instructions: Medium Ability to understand verbal instructions: Medium Motivation Anxiety Level: Calm Cooperation: Cooperative Education Importance: Acknowledges Need Interest in Health Problems: Asks Questions Perception: Coherent Willingness to Engage in Self-Management High Activities: Readiness to Engage in Self-Management High Activities: Electronic Signature(s) Signed: 02/04/2020 3:30:56 PM By: Georges Mouse, Kenia Entered By: Georges Mouse, Minus Breeding on 02/04/2020 09:32:02 Rachel Luna ([id number]) -------------------------------------------------------------------------------- Fall Risk Assessment Details Patient Name: Rachel Luna. Date of Service: 02/04/2020 9:15 AM Medical Record Number: [id number] Patient Account Number:  [id number] Date of Birth/Sex: 10/11/1955 (64 y.o. F) Treating RN: Dolan Amen Primary Care Militza Devery: Agustina Caroli Other Clinician: Referring Milind Raether: Agustina Caroli Treating Kourtnie Sachs/Extender: Skipper Cliche in Treatment: 0 Fall Risk Assessment Items Have you had 2 or more falls in the last 12 monthso 0 Yes Have you had any fall that resulted in injury in the last 12 monthso 0 Yes FALLS RISK SCREEN History of falling - immediate or within 3 months 25 Yes Secondary diagnosis (Do you have 2 or more medical diagnoseso) 15 Yes Ambulatory aid None/bed rest/wheelchair/nurse 0 Yes Crutches/cane/walker 0 No Furniture 0 No Intravenous therapy Access/Saline/Heparin Lock 0 No Gait/Transferring Normal/ bed rest/ wheelchair 0 Yes  Weak (short steps with or without shuffle, stooped but able to lift head while walking, may 0 No seek support from furniture) Impaired (short steps with shuffle, may have difficulty arising from chair, head down, impaired 0 No balance) Mental Status Oriented to own ability 0 Yes Electronic Signature(s) Signed: 02/04/2020 3:30:56 PM By: Georges Mouse, Minus Breeding Entered By: Georges Mouse, Kenia on 02/04/2020 09:32:55 Rachel Luna ([id number]) -------------------------------------------------------------------------------- Foot Assessment Details Patient Name: Rachel Luna. Date of Service: 02/04/2020 9:15 AM Medical Record Number: [id number] Patient Account Number: [id number] Date of Birth/Sex: May 12, 1955 (64 y.o. F) Treating RN: Dolan Amen Primary Care Takeila Thayne: Agustina Caroli Other Clinician: Referring Zhania Shaheen: Agustina Caroli Treating Shakeerah Gradel/Extender: Skipper Cliche in Treatment: 0 Foot Assessment Items Site Locations + = Sensation present, - = Sensation absent, C = Callus, U = Ulcer R = Redness, W = Warmth, M = Maceration, PU = Pre-ulcerative lesion F = Fissure, S = Swelling, D = Dryness Assessment Right: Left: Other  Deformity: No No Prior Foot Ulcer: No No Prior Amputation: No No Charcot Joint: No No Ambulatory Status: Ambulatory Without Help Gait: Steady Electronic Signature(s) Signed: 02/04/2020 3:30:56 PM By: Georges Mouse, Minus Breeding Entered By: Georges Mouse, Minus Breeding on 02/04/2020 09:36:45 Rachel Luna ([id number]) -------------------------------------------------------------------------------- Nutrition Risk Screening Details Patient Name: Rachel Luna. Date of Service: 02/04/2020 9:15 AM Medical Record Number: [id number] Patient Account Number: [id number] Date of Birth/Sex: Feb 22, 1956 (64 y.o. F) Treating RN: Dolan Amen Primary Care Oluwatosin Higginson: Agustina Caroli Other Clinician: Referring Jabria Loos: Agustina Caroli Treating Taiten Brawn/Extender: Skipper Cliche in Treatment: 0 Height (in): Weight (lbs): Body Mass Index (BMI): Nutrition Risk Screening Items Score Screening NUTRITION RISK SCREEN: I have an illness or condition that made me change the kind and/or amount of food I eat 0 No I eat fewer than two meals per day 0 No I eat few fruits and vegetables, or milk products 0 No I have three or more drinks of beer, liquor or wine almost every day 0 No I have tooth or mouth problems that make it hard for me to eat 0 No I don't always have enough money to buy the food I need 0 No I eat alone most of the time 0 No I take three or more different prescribed or over-the-counter drugs a day 0 No Without wanting to, I have lost or gained 10 pounds in the last six months 0 No I am not always physically able to shop, cook and/or feed myself 0 No Nutrition Protocols Good Risk Protocol 0 No interventions needed Moderate Risk Protocol High Risk Proctocol Risk Level: Good Risk Score: 0 Electronic Signature(s) Signed: 02/04/2020 3:30:56 PM By: Georges Mouse, Minus Breeding Entered By: Georges Mouse, Minus Breeding on 02/04/2020 09:33:20

## 2020-02-05 ENCOUNTER — Encounter: Payer: Self-pay | Admitting: Registered Nurse

## 2020-02-05 NOTE — Progress Notes (Signed)
Acute Office Visit  Subjective:    Patient ID: Rachel Luna, female    DOB: 02-19-1956, 64 y.o.   MRN: [id number]  Chief Complaint  Patient presents with  . Foot Pain    Patient states she has diabetes she has an big blister on the back oof her right foot that painful. Per patient she cant even touch it or even wash it    HPI Patient is in today for diabetic foot wound Onset a few weeks ago Worsening Extremely painful Some apparent scabbing vs eschar No spreading redness or streaking Has tried to keep it clean and dry Pain is superficial Has not happened before  Past Medical History:  Diagnosis Date  . Anxiety   . Arthritis   . Bipolar disorder ([place])   . chronic low back pain   . Chronic low back pain 02/18/2014  . Depression   . Diabetes mellitus without complication ([place])   . Diabetes mellitus, type II ([place])   . Fibroids   . GERD (gastroesophageal reflux disease)   . Gout   . Hyperlipidemia   . Hypertension   . Obesity     Past Surgical History:  Procedure Laterality Date  . ABDOMINAL HYSTERECTOMY    . CHOLECYSTECTOMY    . FOOT SURGERY Left   . KNEE ARTHROPLASTY    . OVARIAN CYST REMOVAL    . SPINE SURGERY      Family History  Problem Relation Age of Onset  . Diabetes Mother   . Cancer Mother        breast  . Heart disease Mother   . Mental illness Mother        bipolar disorder  . Stroke Mother   . Breast cancer Mother   . Hypertension Mother   . High Cholesterol Mother   . Diabetes Father   . Hypertension Father   . Diabetes Brother   . Bipolar disorder Brother   . Diabetes Brother   . Breast cancer Maternal Aunt   . Breast cancer Maternal Grandmother     Social History   Socioeconomic History  . Marital status: Divorced    Spouse name: Not on file  . Number of children: 0  . Years of education: 10  . Highest education level: 10th grade  Occupational History    Employer: UNEMPLOYED  Tobacco Use  . Smoking status: Passive Smoke  Exposure - Never Smoker  . Smokeless tobacco: Never Used  . Tobacco comment: room mate smokes  Vaping Use  . Vaping Use: Never used  Substance and Sexual Activity  . Alcohol use: No    Alcohol/week: 0.0 standard drinks  . Drug use: No  . Sexual activity: Not Currently  Other Topics Concern  . Not on file  Social History Narrative   Patient is divorced and lives with a roommate.   Patient is on disability.   Patient has a 10th grade education.   Patient is right-handed.   Patient drinks 6 sodas daily.         Social Determinants of Health   Financial Resource Strain:   . Difficulty of Paying Living Expenses: Not on file  Food Insecurity:   . Worried About Charity fundraiser in the Last Year: Not on file  . Ran Out of Food in the Last Year: Not on file  Transportation Needs:   . Lack of Transportation (Medical): Not on file  . Lack of Transportation (Non-Medical): Not on file  Physical Activity:   .  Days of Exercise per Week: Not on file  . Minutes of Exercise per Session: Not on file  Stress:   . Feeling of Stress : Not on file  Social Connections:   . Frequency of Communication with Friends and Family: Not on file  . Frequency of Social Gatherings with Friends and Family: Not on file  . Attends Religious Services: Not on file  . Active Member of Clubs or Organizations: Not on file  . Attends Archivist Meetings: Not on file  . Marital Status: Not on file  Intimate Partner Violence:   . Fear of Current or Ex-Partner: Not on file  . Emotionally Abused: Not on file  . Physically Abused: Not on file  . Sexually Abused: Not on file    Outpatient Medications Prior to Visit  Medication Sig Dispense Refill  . ACCU-CHEK AVIVA PLUS test strip Use in the morning and after meals as needed. 100 each 5  . amLODipine-valsartan (EXFORGE) 5-160 MG tablet Take 1 tablet by mouth daily. 90 tablet 3  . baclofen (LIORESAL) 10 MG tablet Take 1 tablet (10 mg total) by mouth 3  (three) times daily. 270 each 1  . carbamazepine (TEGRETOL) 200 MG tablet Take 0.5 tablets (100 mg total) by mouth daily. 45 tablet 1  . cetirizine (ZYRTEC) 10 MG tablet Take 10 mg by mouth daily.    . clonazePAM (KLONOPIN) 0.5 MG tablet Take 1 tablet (0.5 mg total) by mouth at bedtime. 30 tablet 3  . dapagliflozin propanediol (FARXIGA) 5 MG TABS tablet Take 1 tablet (5 mg total) by mouth daily before breakfast. 90 tablet 3  . DULoxetine (CYMBALTA) 30 MG capsule Take 1 capsule (30 mg total) by mouth daily. To be combined with 60 mg 90 capsule 1  . DULoxetine (CYMBALTA) 60 MG capsule Take 1 capsule (60 mg total) by mouth daily. To be combined with 30 mg 90 capsule 1  . estradiol (ESTRACE) 0.5 MG tablet Take 1 tablet (0.5 mg total) by mouth daily. 30 tablet 11  . gabapentin (NEURONTIN) 800 MG tablet Take 1 tablet (800 mg total) by mouth 3 (three) times daily. 270 tablet 1  . glimepiride (AMARYL) 4 MG tablet Take 1 tablet (4 mg total) by mouth daily with breakfast. 90 tablet 0  . meloxicam (MOBIC) 15 MG tablet Take 15 mg by mouth daily.     . pramipexole (MIRAPEX) 0.75 MG tablet One tablet three times a day 270 tablet 1  . losartan-hydrochlorothiazide (HYZAAR) 100-25 MG tablet Take 1 tablet by mouth daily.  (Patient not taking: Reported on 01/23/2020)    . phenazopyridine (PYRIDIUM) 200 MG tablet Take 1 tablet (200 mg total) by mouth 3 (three) times daily as needed for pain. (Patient not taking: Reported on 01/23/2020) 10 tablet 0  . rosuvastatin (CRESTOR) 40 MG tablet Take 1 tablet (40 mg total) by mouth daily. 90 tablet 3  . sitaGLIPtin (JANUVIA) 100 MG tablet Take 1 tablet (100 mg total) by mouth daily. 90 tablet 1   No facility-administered medications prior to visit.    Allergies  Allergen Reactions  . Belsomra [Suvorexant] Other (See Comments)    Vivid dreams  . Wellbutrin [Bupropion] Other (See Comments)    Irritability and Agitation  . Geodon [Ziprasidone Hcl] Other (See Comments)     Worsened Restless Leg Syndrome  . Lisinopril Cough  . Neupro [Rotigotine] Rash    Review of Systems Per hpi      Objective:    Physical Exam Vitals  and nursing note reviewed.  Constitutional:      Appearance: Normal appearance.  Cardiovascular:     Rate and Rhythm: Normal rate and regular rhythm.  Pulmonary:     Effort: Pulmonary effort is normal. No respiratory distress.  Musculoskeletal:        General: No swelling, tenderness, deformity or signs of injury. Normal range of motion.     Right lower leg: Edema present.     Left lower leg: Edema present.  Skin:    General: Skin is warm and dry.     Capillary Refill: Capillary refill takes less than 2 seconds.     Coloration: Skin is not jaundiced or pale.     Findings: Lesion present. No bruising, erythema or rash.  Neurological:     General: No focal deficit present.     Mental Status: She is alert and oriented to person, place, and time. Mental status is at baseline.  Psychiatric:        Mood and Affect: Mood normal.        Behavior: Behavior normal.        Thought Content: Thought content normal.        Judgment: Judgment normal.       BP (!) 144/84   Pulse 98   Temp 97.9 F (36.6 C) (Temporal)   Resp 18   Ht 5' 4.5" (1.638 m)   Wt 280 lb 6.4 oz (127.2 kg)   SpO2 92%   BMI 47.39 kg/m  Wt Readings from Last 3 Encounters:  01/23/20 280 lb 6.4 oz (127.2 kg)  12/11/19 289 lb (131.1 kg)  11/20/19 274 lb (124.3 kg)    Health Maintenance Due  Topic Date Due  . OPHTHALMOLOGY EXAM  12/06/2019  . COVID-19 Vaccine (2 - Moderna 2-dose series) 12/07/2019    There are no preventive care reminders to display for this patient.   Lab Results  Component Value Date   TSH 2.030 07/13/2018   Lab Results  Component Value Date   WBC 7.8 12/11/2019   HGB 12.5 12/11/2019   HCT 39.6 12/11/2019   MCV 90 12/11/2019   PLT 228 12/11/2019   Lab Results  Component Value Date   NA 144 12/11/2019   K 3.7 12/11/2019    CO2 27 12/11/2019   GLUCOSE 103 (H) 12/11/2019   BUN 7 (L) 12/11/2019   CREATININE 0.64 12/11/2019   BILITOT <0.2 12/11/2019   ALKPHOS 112 12/11/2019   AST 38 12/11/2019   ALT 39 (H) 12/11/2019   PROT 6.8 12/11/2019   ALBUMIN 4.3 12/11/2019   CALCIUM 9.1 12/11/2019   ANIONGAP 11 11/13/2019   Lab Results  Component Value Date   CHOL 144 11/07/2019   Lab Results  Component Value Date   HDL 52 11/07/2019   Lab Results  Component Value Date   LDLCALC 64 11/07/2019   Lab Results  Component Value Date   TRIG 165 (H) 11/07/2019   Lab Results  Component Value Date   CHOLHDL 2.8 11/07/2019   Lab Results  Component Value Date   HGBA1C 10.7 (A) 11/07/2019       Assessment & Plan:   Problem List Items Addressed This Visit    None    Visit Diagnoses    Diabetic ulcer of right heel associated with type 2 diabetes mellitus, limited to breakdown of skin ([place])    -  Primary   Relevant Medications   doxycycline (VIBRA-TABS) 100 MG tablet   Other Relevant Orders  Ambulatory referral to Vascular Surgery   AMB referral to wound care center       Meds ordered this encounter  Medications  . doxycycline (VIBRA-TABS) 100 MG tablet    Sig: Take 1 tablet (100 mg total) by mouth 2 (two) times daily.    Dispense:  20 tablet    Refill:  0    Order Specific Question:   Supervising Provider    Answer:   Carlota Raspberry, JEFFREY R [[id number]]  . traMADol (ULTRAM) 50 MG tablet    Sig: Take 1 tablet (50 mg total) by mouth every 12 (twelve) hours as needed for up to 10 days.    Dispense:  20 tablet    Refill:  0    Order Specific Question:   Supervising Provider    Answer:   Carlota Raspberry, JEFFREY R [[id number]]   PLAN  Unable to assess depth or tunneling of wound due to pain and discomfort. Concern for continuing skin breakdown.  Will give course of doxycycline for potential infection or infection protection  Tramadol for pain  Pt neurovascularly intact distal to wound  Pt should be managed by  wound care / vascular surg or other specialist given her complicated vascular situation - will place referral today  Patient encouraged to call clinic with any questions, comments, or concerns.   Maximiano Coss, NP

## 2020-02-06 ENCOUNTER — Other Ambulatory Visit: Payer: Self-pay

## 2020-02-06 ENCOUNTER — Ambulatory Visit: Payer: Medicare Other | Admitting: Emergency Medicine

## 2020-02-06 NOTE — Telephone Encounter (Signed)
Received a medication refill request from [facility] for clonazepam 0.5mg . Patient was last seen on 10/16/19 with Judson Roch. Next OV is scheduled for 04/21/20 with Dr. Jannifer Franklin. Last RX was written on 10/16/19 for 30 tablets, 3 refills. [facility] has been reviewed, last filled on 01/16/20 for 30 tablets.   Sarah, please advise if you are ok with this refill. I have pended the medication for you.

## 2020-02-08 NOTE — Progress Notes (Signed)
DUTCHESS, CROSLAND ([id number]) Visit Report for 02/04/2020 Allergy List Details Patient Name: Rachel Luna, Rachel Luna. Date of Service: 02/04/2020 9:15 AM Medical Record Number: [id number] Patient Account Number: [id number] Date of Birth/Sex: 1956/02/24 (64 y.o. Female) Treating RN: Dolan Amen Primary Care Zen Cedillos: Agustina Caroli Other Clinician: Referring Hermon Zea: Agustina Caroli Treating Nycere Presley/Extender: Jeri Cos Weeks in Treatment: 0 Allergies Active Allergies lisinopril Reaction: excessive coughing Allergy Notes Electronic Signature(s) Signed: 02/04/2020 3:30:56 PM By: Georges Mouse, Minus Breeding Entered By: Georges Mouse, Minus Breeding on 02/04/2020 09:38:27 Rachel Luna ([id number]) -------------------------------------------------------------------------------- [place] Information Details Patient Name: Rachel Luna. Date of Service: 02/04/2020 9:15 AM Medical Record Number: [id number] Patient Account Number: [id number] Date of Birth/Sex: 03/23/1955 (64 y.o. Female) Treating RN: Dolan Amen Primary Care Zannah Melucci: Agustina Caroli Other Clinician: Referring Odean Fester: Agustina Caroli Treating Neesha Langton/Extender: Skipper Cliche in Treatment: 0 Visit Information Patient Arrived: Ambulatory Arrival Time: 09:00 Accompanied By: self Transfer Assistance: None Patient Identification Verified: Yes Secondary Verification Process Completed: Yes Patient Has Alerts: Yes Patient Alerts: Type II Diabetic Electronic Signature(s) Signed: 02/04/2020 12:09:53 PM By: Gretta Cool, BSN, RN, CWS, Kim RN, BSN Entered By: Gretta Cool, BSN, RN, CWS, Kim on 02/04/2020 12:09:53 Rachel Luna ([id number]) -------------------------------------------------------------------------------- Clinic Level of Care Assessment Details Patient Name: Rachel Luna. Date of Service: 02/04/2020 9:15 AM Medical Record Number: [id number] Patient Account Number: [id number] Date of Birth/Sex: [birth year] (64 y.o.  Female) Treating RN: Cornell Barman Primary Care Makana Feigel: Agustina Caroli Other Clinician: Referring Ayub Kirsh: Agustina Caroli Treating Kara Mierzejewski/Extender: Skipper Cliche in Treatment: 0 Clinic Level of Care Assessment Items TOOL 1 Quantity Score _0  - Use when EandM and Procedure is performed on INITIAL visit 0 ASSESSMENTS - Nursing Assessment / Reassessment X - General Physical Exam (combine w/ comprehensive assessment (listed just below) when performed on new 1 20 pt. evals) X- 1 25 Comprehensive Assessment (HX, ROS, Risk Assessments, Wounds Hx, etc.) ASSESSMENTS - Wound and Skin Assessment / Reassessment _1  - Dermatologic / Skin Assessment (not related to wound area) 0 ASSESSMENTS - Ostomy and/or Continence Assessment and Care _2  - Incontinence Assessment and Management 0 _3  - 0 Ostomy Care Assessment and Management (repouching, etc.) PROCESS - Coordination of Care X - Simple Patient / Family Education for ongoing care 1 15 _4  - 0 Complex (extensive) Patient / Family Education for ongoing care _5  - 0 Staff obtains Programmer, systems, Records, Test Results / Process Orders _6  - 0 Staff telephones HHA, Nursing Homes / Clarify orders / etc _7  - 0 Routine Transfer to another Facility (non-emergent condition) _8  - 0 Routine Hospital Admission (non-emergent condition) X- 1 15 New Admissions / Biomedical engineer / Ordering NPWT, Apligraf, etc. _9  - 0 Emergency Hospital Admission (emergent condition) PROCESS - Special Needs _10  - Pediatric / Minor Patient Management 0 _11  - 0 Isolation Patient Management _12  - 0 Hearing / Language / Visual special needs _13  - 0 Assessment of Community assistance (transportation, D/C planning, etc.) _14  - 0 Additional assistance / Altered mentation _15  - 0 Support Surface(s) Assessment (bed, cushion, seat, etc.) INTERVENTIONS - Miscellaneous _16  - External ear exam 0 _17  - 0 Patient Transfer (multiple staff / Civil Service fast streamer / Similar devices) _18  -  0 Simple Staple / Suture removal (25 or less) _19  - 0 Complex Staple / Suture removal (26 or more) _20  - 0 Hypo/Hyperglycemic Management (do not check if billed separately) X- 1 15 Ankle / Brachial Index (ABI) - do not check if billed separately Has the patient been seen at the hospital within the last three  years: Yes Total Score: 90 Level Of Care: New/Established - Level 3 Rachel Luna, Rachel Luna ([id number]) Electronic Signature(s) Signed: 02/08/2020 4:55:43 PM By: Gretta Cool, BSN, RN, CWS, Kim RN, BSN Entered By: Gretta Cool, BSN, RN, CWS, Kim on 02/04/2020 10:22:36 Rachel Luna ([id number]) -------------------------------------------------------------------------------- Encounter Discharge Information Details Patient Name: Rachel Luna. Date of Service: 02/04/2020 9:15 AM Medical Record Number: [id number] Patient Account Number: [id number] Date of Birth/Sex: December 16, 1955 (64 y.o. Female) Treating RN: Cornell Barman Primary Care Brissia Delisa: Agustina Caroli Other Clinician: Referring Mariem Skolnick: Agustina Caroli Treating Marlen Koman/Extender: Skipper Cliche in Treatment: 0 Encounter Discharge Information Items Post Procedure Vitals Discharge Condition: Stable Temperature (F): 98.0 Ambulatory Status: Ambulatory Pulse (bpm): 76 Discharge Destination: Home Respiratory Rate (breaths/min): 20 Transportation: Private Auto Blood Pressure (mmHg): 154/84 Accompanied By: self Schedule Follow-up Appointment: Yes Clinical Summary of Care: Electronic Signature(s) Signed: 02/08/2020 4:55:43 PM By: Gretta Cool, BSN, RN, CWS, Kim RN, BSN Entered By: Gretta Cool, BSN, RN, CWS, Kim on 02/04/2020 10:32:54 Rachel Luna ([id number]) -------------------------------------------------------------------------------- Lower Extremity Assessment Details Patient Name: Rachel Standard L. Date of Service: 02/04/2020 9:15 AM Medical Record Number: [id number] Patient Account Number: [id number] Date of Birth/Sex: [birth year] (64 y.o.  Female) Treating RN: Dolan Amen Primary Care Jesse Hirst: Agustina Caroli Other Clinician: Referring Magdalena Skilton: Agustina Caroli Treating Ione Sandusky/Extender: Skipper Cliche in Treatment: 0 Edema Assessment Assessed: [Left: Yes] [Right: Yes] Edema: [Left: No] [Right: No] Calf Left: Right: Point of Measurement: 33 cm From Medial Instep 44.5 cm 45.2 cm Ankle Left: Right: Point of Measurement: 10 cm From Medial Instep 26.2 cm 25 cm Vascular Assessment Pulses: Dorsalis Pedis Palpable: [Left:No] [Right:No] Doppler Audible: [Left:Yes] [Right:Yes] Posterior Tibial Palpable: [Left:No] [Right:No] Doppler Audible: [Left:Yes] [Right:Yes] Blood Pressure: Brachial: [Left:142] [Right:148] Dorsalis Pedis: 168 [Left:Dorsalis Pedis: 244] Ankle: Posterior Tibial: 160 [Left:Posterior Tibial: 190 1.14] [Right:1.28] Electronic Signature(s) Signed: 02/04/2020 3:30:56 PM By: Georges Mouse, Minus Breeding Entered By: Georges Mouse, Minus Breeding on 02/04/2020 10:05:17 Rachel Luna ([id number]) -------------------------------------------------------------------------------- Multi Wound Chart Details Patient Name: Rachel Luna. Date of Service: 02/04/2020 9:15 AM Medical Record Number: [id number] Patient Account Number: [id number] Date of Birth/Sex: 12/21/1955 (64 y.o. Female) Treating RN: Cornell Barman Primary Care Jesusita Jocelyn: Agustina Caroli Other Clinician: Referring Denym Rahimi: Agustina Caroli Treating Satine Hausner/Extender: Skipper Cliche in Treatment: 0 Vital Signs Height(in): 64 Pulse(bpm): 24 Weight(lbs): 278 Blood Pressure(mmHg): 154/84 Body Mass Index(BMI): 48 Temperature(F): 98.0 Respiratory Rate(breaths/min): 20 Photos: [N/A:N/A] Wound Location: Right Calcaneus N/A N/A Wounding Event: Gradually Appeared N/A N/A Primary Etiology: Diabetic Wound/Ulcer of the Lower N/A N/A Extremity Comorbid History: Hypertension, Type II Diabetes, N/A N/A Osteoarthritis, Neuropathy, Quadriplegia,  Paraplegia Date Acquired: 01/19/2020 N/A N/A Weeks of Treatment: 0 N/A N/A Wound Status: Open N/A N/A Measurements L x W x D (cm) 1x1.2x0.1 N/A N/A Area (cm) : 0.942 N/A N/A Volume (cm) : 0.094 N/A N/A % Reduction in Area: 0.00% N/A N/A % Reduction in Volume: 0.00% N/A N/A Classification: Grade 1 N/A N/A Exudate Amount: Medium N/A N/A Exudate Type: Serosanguineous N/A N/A Exudate Color: red, brown N/A N/A Granulation Amount: Small (1-33%) N/A N/A Granulation Quality: Red N/A N/A Necrotic Amount: Medium (34-66%) N/A N/A Necrotic Tissue: Eschar N/A N/A Exposed Structures: Fat Layer (Subcutaneous Tissue): N/A N/A Yes Fascia: No Tendon: No Muscle: No Joint: No Bone: No Epithelialization: None N/A N/A Treatment Notes Electronic Signature(s) Signed: 02/08/2020 4:55:43 PM By: Gretta Cool, BSN, RN, CWS, Kim RN, BSN Entered By: Gretta Cool, BSN, RN, CWS, Kim on 02/04/2020 10:17:04 Rachel Luna ([id number]) -------------------------------------------------------------------------------- [place] Details Patient Name: Rachel Luna. Date  of Service: 02/04/2020 9:15 AM Medical Record Number: [id number] Patient Account Number: [id number] Date of Birth/Sex: 11-Jul-1955 (64 y.o. Female) Treating RN: Cornell Barman Primary Care Gladine Plude: Agustina Caroli Other Clinician: Referring Mckenize Mezera: Agustina Caroli Treating Elaf Clauson/Extender: Skipper Cliche in Treatment: 0 Active Inactive Necrotic Tissue Nursing Diagnoses: Impaired tissue integrity related to necrotic/devitalized tissue Goals: Necrotic/devitalized tissue will be minimized in the wound bed Date Initiated: 02/04/2020 Target Resolution Date: 03/03/2020 Goal Status: Active Patient/caregiver will verbalize understanding of reason and process for debridement of necrotic tissue Date Initiated: 02/04/2020 Target Resolution Date: 03/03/2020 Goal Status: Active Interventions: Assess patient pain level pre-, during and  post procedure and prior to discharge Provide education on necrotic tissue and debridement process Treatment Activities: Apply topical anesthetic as ordered : 02/04/2020 Notes: Orientation to the Wound Care Program Nursing Diagnoses: Knowledge deficit related to the wound healing center program Goals: Patient/caregiver will verbalize understanding of the [place] Date Initiated: 02/04/2020 Target Resolution Date: 03/03/2020 Goal Status: Active Interventions: Provide education on orientation to the wound center Notes: Pain, Acute or Chronic Nursing Diagnoses: Pain, acute or chronic: actual or potential Potential alteration in comfort, pain Goals: Patient will verbalize adequate pain control and receive pain control interventions during procedures as needed Date Initiated: 02/04/2020 Target Resolution Date: 03/03/2020 Goal Status: Active Patient/caregiver will verbalize adequate pain control between visits Date Initiated: 02/04/2020 Target Resolution Date: 03/03/2020 Goal Status: Active Patient/caregiver will verbalize comfort level met Date Initiated: 02/04/2020 Target Resolution Date: 03/03/2020 Goal Status: Active Rachel Luna, Rachel Luna ([id number]) Interventions: Assess comfort goal upon admission Reposition patient for comfort Notes: Peripheral Neuropathy Nursing Diagnoses: Knowledge deficit related to disease process and management of peripheral neurovascular dysfunction Potential alteration in peripheral tissue perfusion (select prior to confirmation of diagnosis) Goals: Patient/caregiver will verbalize understanding of disease process and disease management Date Initiated: 02/04/2020 Target Resolution Date: 03/03/2020 Goal Status: Active Interventions: Assess signs and symptoms of neuropathy upon admission and as needed Provide education on Management of Neuropathy and Related Ulcers Treatment Activities: Patient referred for customized  footwear/offloading : 02/04/2020 Notes: Pressure Nursing Diagnoses: Knowledge deficit related to causes and risk factors for pressure ulcer development Knowledge deficit related to management of pressures ulcers Goals: Patient will remain free from development of additional pressure ulcers Date Initiated: 02/04/2020 Target Resolution Date: 03/03/2020 Goal Status: Active Patient/caregiver will verbalize risk factors for pressure ulcer development Date Initiated: 02/04/2020 Target Resolution Date: 03/03/2020 Goal Status: Active Patient/caregiver will verbalize understanding of pressure ulcer management Date Initiated: 02/04/2020 Target Resolution Date: 03/03/2020 Goal Status: Active Interventions: Provide education on pressure ulcers Notes: Wound/Skin Impairment Nursing Diagnoses: Impaired tissue integrity Goals: Patient/caregiver will verbalize understanding of skin care regimen Date Initiated: 02/04/2020 Target Resolution Date: 03/03/2020 Goal Status: Active Ulcer/skin breakdown will have a volume reduction of 30% by week 4 Date Initiated: 02/04/2020 Target Resolution Date: 03/03/2020 Goal Status: Active Ulcer/skin breakdown will have a volume reduction of 50% by week 8 Date Initiated: 02/04/2020 Target Resolution Date: 04/03/2020 Goal Status: Active Ulcer/skin breakdown will have a volume reduction of 80% by week 12 Date Initiated: 02/04/2020 Target Resolution Date: 05/04/2020 Goal Status: Active Rachel Luna, Rachel Luna ([id number]) Ulcer/skin breakdown will heal within 14 weeks Date Initiated: 02/04/2020 Target Resolution Date: 05/12/2020 Goal Status: Active Interventions: Assess patient/caregiver ability to obtain necessary supplies Assess ulceration(s) every visit Provide education on ulcer and skin care Treatment Activities: Skin care regimen initiated : 02/04/2020 Topical wound management initiated : 02/04/2020 Notes: Electronic Signature(s) Signed: 02/08/2020 4:55:43  PM By: Gretta Cool, BSN, RN,  CWS, Kim RN, BSN Entered By: Gretta Cool, BSN, RN, CWS, Kim on 02/04/2020 10:16:44 Rachel Luna ([id number]) -------------------------------------------------------------------------------- Pain Assessment Details Patient Name: Rachel Luna. Date of Service: 02/04/2020 9:15 AM Medical Record Number: [id number] Patient Account Number: [id number] Date of Birth/Sex: April 17, 1955 (64 y.o. Female) Treating RN: Dolan Amen Primary Care Joon Pohle: Agustina Caroli Other Clinician: Referring Anyelo Mccue: Agustina Caroli Treating Ramone Gander/Extender: Skipper Cliche in Treatment: 0 Active Problems Location of Pain Severity and Description of Pain Patient Has Paino Yes Site Locations Pain Location: Pain in Ulcers Rate the pain. Current Pain Level: 4 Pain Management and Medication Current Pain Management: Electronic Signature(s) Signed: 02/04/2020 3:30:56 PM By: Georges Mouse, Kenia Entered By: Georges Mouse, Minus Breeding on 02/04/2020 09:37:23 Rachel Luna ([id number]) -------------------------------------------------------------------------------- Patient/Caregiver Education Details Patient Name: Rachel Luna. Date of Service: 02/04/2020 9:15 AM Medical Record Number: [id number] Patient Account Number: [id number] Date of Birth/Gender: 10-24-1955 (64 y.o. Female) Treating RN: Cornell Barman Primary Care Physician: Agustina Caroli Other Clinician: Referring Physician: Agustina Caroli Treating Physician/Extender: Skipper Cliche in Treatment: 0 Education Assessment Education Provided To: Patient Education Topics Provided Peripheral Neuropathy: Handouts: Diabetes Methods: Demonstration, Explain/Verbal Responses: State content correctly Welcome To [facility] [place]: Handouts: Welcome To [facility] [place] Methods: Demonstration, Explain/Verbal Responses: State content correctly Wound Debridement: Handouts: Wound Debridement Methods: Demonstration,  Explain/Verbal Responses: State content correctly Wound/Skin Impairment: Handouts: Caring for Your Ulcer Methods: Demonstration, Explain/Verbal Responses: State content correctly Electronic Signature(s) Signed: 02/08/2020 4:55:43 PM By: Gretta Cool, BSN, RN, CWS, Kim RN, BSN Entered By: Gretta Cool, BSN, RN, CWS, Kim on 02/04/2020 10:28:23 Rachel Luna ([id number]) -------------------------------------------------------------------------------- Wound Assessment Details Patient Name: Rachel Luna. Date of Service: 02/04/2020 9:15 AM Medical Record Number: [id number] Patient Account Number: [id number] Date of Birth/Sex: 11-23-1955 (64 y.o. Female) Treating RN: Dolan Amen Primary Care Bush Murdoch: Agustina Caroli Other Clinician: Referring Ketura Sirek: Agustina Caroli Treating Dusan Lipford/Extender: Skipper Cliche in Treatment: 0 Wound Status Wound Number: 1 Primary Diabetic Wound/Ulcer of the Lower Extremity Etiology: Wound Location: Right Calcaneus Wound Open Wounding Event: Gradually Appeared Status: Date Acquired: 01/19/2020 Comorbid Hypertension, Type II Diabetes, Osteoarthritis, Weeks Of Treatment: 0 History: Neuropathy, Quadriplegia, Paraplegia Clustered Wound: No Photos Wound Measurements Length: (cm) 1 % Redu Width: (cm) 1.2 % Redu Depth: (cm) 0.1 Epithe Area: (cm) 0.942 [place] Volume: (cm) 0.094 Under ction in Area: 0% ction in Volume: 0% lialization: None ling: No mining: No Wound Description Classification: Grade 1 Foul O Exudate Amount: Medium Slough Exudate Type: Serosanguineous Exudate Color: red, brown dor After Cleansing: No /Fibrino No Wound Bed Granulation Amount: Small (1-33%) Exposed Structure Granulation Quality: Red Fascia Exposed: No Necrotic Amount: Medium (34-66%) Fat Layer (Subcutaneous Tissue) Exposed: Yes Necrotic Quality: Eschar Tendon Exposed: No Muscle Exposed: No Joint Exposed: No Bone Exposed: No Treatment Notes Wound #1 (Right  Calcaneus) Notes xeroform gauze, gauze, conform secured with stretch-net Electronic Signature(s) Signed: 02/04/2020 3:30:56 PM By: Mosetta Pigeon, Lisabeth L. ([id number]) Entered By: Georges Mouse, Minus Breeding on 02/04/2020 09:42:30 Rachel Luna ([id number]) -------------------------------------------------------------------------------- Vitals Details Patient Name: Rachel Luna. Date of Service: 02/04/2020 9:15 AM Medical Record Number: [id number] Patient Account Number: [id number] Date of Birth/Sex: 09-02-1955 (64 y.o. Female) Treating RN: Dolan Amen Primary Care Ivor Kishi: Agustina Caroli Other Clinician: Referring Artina Minella: Agustina Caroli Treating Adalaide Jaskolski/Extender: Skipper Cliche in Treatment: 0 Vital Signs Time Taken: 09:00 Temperature (F): 98.0 Height (in): 64 Pulse (bpm): 76 Weight (lbs): 278 Respiratory Rate (breaths/min): 20 Body Mass Index (BMI): 47.7 Blood Pressure (mmHg): 154/84 Reference Range:  80 - 120 mg / dl Electronic Signature(s) Signed: 02/04/2020 3:30:56 PM By: Georges Mouse, Kenia Entered By: Georges Mouse, Minus Breeding on 02/04/2020 09:38:02

## 2020-02-08 NOTE — Progress Notes (Signed)
GAYNOR, GENCO ([id number]) Visit Report for 02/04/2020 Chief Complaint Document Details Patient Name: Rachel Luna. Date of Service: 02/04/2020 9:15 AM Medical Record Number: [id number] Patient Account Number: [id number] Date of Birth/Sex: 1955-04-01 (64 y.o. Female) Treating RN: Cornell Barman Primary Care Provider: Agustina Caroli Other Clinician: Referring Provider: Agustina Caroli Treating Provider/Extender: Skipper Cliche in Treatment: 0 Information Obtained from: Patient Chief Complaint Right heel ulcer Electronic Signature(s) Signed: 02/04/2020 10:09:08 AM By: Worthy Keeler PA-C Entered By: Worthy Keeler on 02/04/2020 10:09:07 Rachel Luna ([id number]) -------------------------------------------------------------------------------- Debridement Details Patient Name: Rachel Luna Date of Service: 02/04/2020 9:15 AM Medical Record Number: [id number] Patient Account Number: [id number] Date of Birth/Sex: [birth year] (64 y.o. Female) Treating RN: Cornell Barman Primary Care Provider: Agustina Caroli Other Clinician: Referring Provider: Agustina Caroli Treating Provider/Extender: Skipper Cliche in Treatment: 0 Debridement Performed for Wound #1 Right Calcaneus Assessment: Performed By: Physician Tommie Sams., PA-C Debridement Type: Debridement Severity of Tissue Pre Debridement: Fat layer exposed Level of Consciousness (Pre- Awake and Alert procedure): Pre-procedure Verification/Time Out Yes - 10:15 Taken: Total Area Debrided (L x W): 1 (cm) x 1.2 (cm) = 1.2 (cm) Tissue and other material Viable, Non-Viable, Eschar, Slough, Subcutaneous, Slough debrided: Level: Skin/Subcutaneous Tissue Debridement Description: Excisional Instrument: Curette Bleeding: Minimum Hemostasis Achieved: Pressure Response to Treatment: Procedure was tolerated well Level of Consciousness (Post- Awake and Alert procedure): Post Debridement Measurements of Total  Wound Length: (cm) 1 Width: (cm) 1.2 Depth: (cm) 0.2 Volume: (cm) 0.188 Character of Wound/Ulcer Post Debridement: Stable Severity of Tissue Post Debridement: Fat layer exposed Post Procedure Diagnosis Same as Pre-procedure Electronic Signature(s) Signed: 02/04/2020 4:31:57 PM By: Worthy Keeler PA-C Signed: 02/08/2020 4:55:43 PM By: Gretta Cool, BSN, RN, CWS, Kim RN, BSN Entered By: Gretta Cool, BSN, RN, CWS, Kim on 02/04/2020 10:18:13 Rachel Luna ([id number]) -------------------------------------------------------------------------------- HPI Details Patient Name: Rachel Luna. Date of Service: 02/04/2020 9:15 AM Medical Record Number: [id number] Patient Account Number: [id number] Date of Birth/Sex: [birth year] (64 y.o. Female) Treating RN: Cornell Barman Primary Care Provider: Agustina Caroli Other Clinician: Referring Provider: Agustina Caroli Treating Provider/Extender: Skipper Cliche in Treatment: 0 History of Present Illness HPI Description: 02/04/2020 upon evaluation today patient presents for initial inspection here in our clinic concerning issues that she has been having with her right heel. She states this began when she was helping her friend move. She also tells me that when questioned about wearing shoes and what shoes she was wearing during that time that she does not believe she actually had any on. Nonetheless she is not exactly sure what even happened to the heel she thought it might of started as a blister but to be honest she really has no idea from what she is telling me today. She does relate to me however that she has been having quite a bit of discomfort unfortunately at this site. She has been placed on doxycycline by her primary care provider rather the PA at the provider's office. She also has been using peroxide on this daily and it for the most part leaving it open although she occasionally covers that she tells me it never stays in place she does have an  appointment with vascular next month I am assuming in regard to everything going on here. Her ABIs were 1.14 on the left and 1.28 on the right. Obviously she seems to have good blood flow in my opinion. The patient is diabetic and does have hypertension but otherwise no significant major medical  problems noted. Electronic Signature(s) Signed: 02/04/2020 10:46:52 AM By: Worthy Keeler PA-C Entered By: Worthy Keeler on 02/04/2020 10:46:52 Rachel Luna ([id number]) -------------------------------------------------------------------------------- Physical Exam Details Patient Name: Rachel Luna. Date of Service: 02/04/2020 9:15 AM Medical Record Number: [id number] Patient Account Number: [id number] Date of Birth/Sex: 11/05/1955 (64 y.o. Female) Treating RN: Cornell Barman Primary Care Provider: Agustina Caroli Other Clinician: Referring Provider: Agustina Caroli Treating Provider/Extender: Skipper Cliche in Treatment: 0 Constitutional patient is hypertensive.. pulse regular and within target range for patient.Marland Kitchen respirations regular, non-labored and within target range for patient.Marland Kitchen temperature within target range for patient.. Well-nourished and well-hydrated in no acute distress. Eyes conjunctiva clear no eyelid edema noted. pupils equal round and reactive to light and accommodation. Ears, Nose, Mouth, and Throat no gross abnormality of ear auricles or external auditory canals. normal hearing noted during conversation. mucus membranes moist. Respiratory normal breathing without difficulty. Cardiovascular 2+ dorsalis pedis/posterior tibialis pulses. no clubbing, cyanosis, significant edema, <3 sec cap refill. Musculoskeletal normal gait and posture. no significant deformity or arthritic changes, no loss or range of motion, no clubbing. Psychiatric this patient is able to make decisions and demonstrates good insight into disease process. Alert and Oriented x 3. pleasant and  cooperative. Notes Upon inspection patient's wound bed actually showed some necrotic tissue over the surface of the wound that was a lot of dried drainage as well as a little bit of eschar. With that being said we were able to spray the stomach with benzocaine and subsequently I was able to remove some of the necrotic tissue from the surface of the wound. The patient did have discomfort so I had to be very careful nonetheless in the end she had just a very small area which was still remaining open at this point. And this also appears to be very superficial and not deep. With that being said I think she is good to do well at this time as far as treatment is concerned with just simply a Xeroform type gauze dressing. Electronic Signature(s) Signed: 02/04/2020 11:11:24 AM By: Worthy Keeler PA-C Entered By: Worthy Keeler on 02/04/2020 11:11:23 Rachel Luna ([id number]) -------------------------------------------------------------------------------- Physician Orders Details Patient Name: Rachel Luna Date of Service: 02/04/2020 9:15 AM Medical Record Number: [id number] Patient Account Number: [id number] Date of Birth/Sex: 1956-01-16 (64 y.o. Female) Treating RN: Cornell Barman Primary Care Provider: Agustina Caroli Other Clinician: Referring Provider: Agustina Caroli Treating Provider/Extender: Skipper Cliche in Treatment: 0 Verbal / Phone Orders: No Diagnosis Coding ICD-10 Coding Code Description E11.621 Type 2 diabetes mellitus with foot ulcer L97.512 Non-pressure chronic ulcer of other part of right foot with fat layer exposed I10 Essential (primary) hypertension Wound Cleansing Wound #1 Right Calcaneus o Dial antibacterial soap, wash wounds, rinse and pat dry prior to dressing wounds o May Shower, gently pat wound dry prior to applying new dressing. Anesthetic (add to Medication List) Wound #1 Right Calcaneus o Benzocaine Topical Anesthetic Spray applied to wound bed  prior to debridement (In Clinic Only). Primary Wound Dressing Wound #1 Right Calcaneus o Xeroform gauze Secondary Dressing Wound #1 Right Calcaneus o Conform/Kerlix - Secured with stretchnet Dressing Change Frequency Wound #1 Right Calcaneus o Change Dressing Monday, Wednesday, Friday Follow-up Appointments Wound #1 Right Calcaneus o Return Appointment in 1 week. Edema Control Wound #1 Right Calcaneus o Elevate legs to the level of the heart and pump ankles as often as possible Off-Loading Wound #1 Right Calcaneus o Other: - keep pressure off of heel  Additional Orders / Instructions Wound #1 Right Calcaneus o Increase protein intake. o Activity as tolerated Electronic Signature(s) Signed: 02/04/2020 4:31:57 PM By: Worthy Keeler PA-C Signed: 02/08/2020 4:55:43 PM By: Gretta Cool BSN, RN, CWS, Kim RN, BSN Rachel Luna, Rachel Luna ([id number]) Entered By: Gretta Cool, BSN, RN, CWS, Kim on 02/04/2020 10:22:00 Rachel Luna ([id number]) -------------------------------------------------------------------------------- Problem List Details Patient Name: Rachel Luna. Date of Service: 02/04/2020 9:15 AM Medical Record Number: [id number] Patient Account Number: [id number] Date of Birth/Sex: 02/09/1956 (64 y.o. Female) Treating RN: Cornell Barman Primary Care Provider: Agustina Caroli Other Clinician: Referring Provider: Agustina Caroli Treating Provider/Extender: Skipper Cliche in Treatment: 0 Active Problems ICD-10 Encounter Code Description Active Date MDM Diagnosis E11.621 Type 2 diabetes mellitus with foot ulcer 02/04/2020 No Yes L97.512 Non-pressure chronic ulcer of other part of right foot with fat layer 02/04/2020 No Yes exposed [place] (primary) hypertension 02/04/2020 No Yes Inactive Problems Resolved Problems Electronic Signature(s) Signed: 02/04/2020 4:31:57 PM By: Worthy Keeler PA-C Signed: 02/08/2020 4:55:43 PM By: Gretta Cool, BSN, RN, CWS, Kim RN,  BSN Previous Signature: 02/04/2020 10:08:50 AM Version By: Worthy Keeler PA-C Entered By: Gretta Cool, BSN, RN, CWS, Kim on 02/04/2020 10:28:42 Rachel Luna ([id number]) -------------------------------------------------------------------------------- Progress Note Details Patient Name: Rachel Standard L. Date of Service: 02/04/2020 9:15 AM Medical Record Number: [id number] Patient Account Number: [id number] Date of Birth/Sex: Mar 22, 1955 (64 y.o. Female) Treating RN: Cornell Barman Primary Care Provider: Agustina Caroli Other Clinician: Referring Provider: Agustina Caroli Treating Provider/Extender: Skipper Cliche in Treatment: 0 Subjective Chief Complaint Information obtained from Patient Right heel ulcer History of Present Illness (HPI) 02/04/2020 upon evaluation today patient presents for initial inspection here in our clinic concerning issues that she has been having with her right heel. She states this began when she was helping her friend move. She also tells me that when questioned about wearing shoes and what shoes she was wearing during that time that she does not believe she actually had any on. Nonetheless she is not exactly sure what even happened to the heel she thought it might of started as a blister but to be honest she really has no idea from what she is telling me today. She does relate to me however that she has been having quite a bit of discomfort unfortunately at this site. She has been placed on doxycycline by her primary care provider rather the PA at the provider's office. She also has been using peroxide on this daily and it for the most part leaving it open although she occasionally covers that she tells me it never stays in place she does have an appointment with vascular next month I am assuming in regard to everything going on here. Her ABIs were 1.14 on the left and 1.28 on the right. Obviously she seems to have good blood flow in my opinion. The patient is  diabetic and does have hypertension but otherwise no significant major medical problems noted. Patient History Allergies lisinopril (Reaction: excessive coughing) Family History Cancer - Mother, Diabetes - Mother,Father, Hypertension - Mother,Father, Kidney Disease - Father, Stroke - Mother, No family history of Heart Disease, Hereditary Spherocytosis, Lung Disease, Seizures, Thyroid Problems, Tuberculosis. Social History Never smoker, Marital Status - Divorced, Alcohol Use - Never, Drug Use - No History, Caffeine Use - Daily - soft drinks, tea. Medical History Eyes Denies history of Cataracts, Glaucoma, Optic Neuritis Ear/Nose/Mouth/Throat Denies history of Chronic sinus problems/congestion, Middle ear problems Hematologic/Lymphatic Denies history of Anemia, Hemophilia, Human Immunodeficiency Virus, Lymphedema, Sickle  Cell Disease Respiratory Denies history of Aspiration, Asthma, Chronic Obstructive Pulmonary Disease (COPD), Pneumothorax, Sleep Apnea, Tuberculosis Cardiovascular Patient has history of Hypertension Denies history of Angina, Arrhythmia, Congestive Heart Failure, Coronary Artery Disease, Deep Vein Thrombosis, Hypotension, Myocardial Infarction, Peripheral Arterial Disease, Peripheral Venous Disease, Phlebitis, Vasculitis Gastrointestinal Denies history of Cirrhosis , Colitis, Crohn s, Hepatitis A, Hepatitis B, Hepatitis C Endocrine Patient has history of Type II Diabetes Genitourinary Denies history of End Stage Renal Disease Immunological Denies history of Lupus Erythematosus, Raynaud s, Scleroderma Integumentary (Skin) Denies history of History of Burn, History of pressure wounds Musculoskeletal Patient has history of Osteoarthritis Denies history of Gout, Rheumatoid Arthritis, Osteomyelitis Neurologic Patient has history of Neuropathy, Quadriplegia, Paraplegia Denies history of Dementia, Seizure Disorder Oncologic Denies history of Received Chemotherapy,  Received Radiation Psychiatric Denies history of Anorexia/bulimia, Confinement Anxiety Patient is treated with Controlled Diet, Oral Agents. Blood sugar is not tested. Hospitalization/Surgery History - Cholecystectomy. - Hysterectomy. - Back surgery-herniated disc. - right knee surgery. - left foot surgery. Rachel Luna, Rachel Luna ([id number]) Review of Systems (ROS) Constitutional Symptoms (General Health) Denies complaints or symptoms of Fatigue, Fever, Chills, Marked Weight Change. Eyes Complains or has symptoms of Glasses / Contacts. Denies complaints or symptoms of Dry Eyes, Vision Changes. Ear/Nose/Mouth/Throat Denies complaints or symptoms of Difficult clearing ears, Sinusitis. Hematologic/Lymphatic Denies complaints or symptoms of Bleeding / Clotting Disorders, Human Immunodeficiency Virus. Respiratory Denies complaints or symptoms of Chronic or frequent coughs, Shortness of Breath. Cardiovascular Complains or has symptoms of LE edema - right leg-nerve damage. Denies complaints or symptoms of Chest pain. Gastrointestinal Denies complaints or symptoms of Frequent diarrhea, Nausea, Vomiting. Endocrine Complains or has symptoms of Polydypsia (Excessive Thirst). Denies complaints or symptoms of Hepatitis, Thyroid disease. Genitourinary Denies complaints or symptoms of Kidney failure/ Dialysis, Incontinence/dribbling. Immunological Denies complaints or symptoms of Hives, Itching. Integumentary (Skin) Complains or has symptoms of Wounds, Bleeding or bruising tendency - bruising, Swelling - right leg. Denies complaints or symptoms of Breakdown. Musculoskeletal Complains or has symptoms of Muscle Pain - right leg, Muscle Weakness - right leg. Neurologic Complains or has symptoms of Numbness/parasthesias, Focal/Weakness. Psychiatric Complains or has symptoms of Anxiety, Claustrophobia. Objective Constitutional patient is hypertensive.. pulse regular and within target range for  patient.Marland Kitchen respirations regular, non-labored and within target range for patient.Marland Kitchen temperature within target range for patient.. Well-nourished and well-hydrated in no acute distress. Vitals Time Taken: 9:00 AM, Height: 64 in, Weight: 278 lbs, BMI: 47.7, Temperature: 98.0 F, Pulse: 76 bpm, Respiratory Rate: 20 breaths/min, Blood Pressure: 154/84 mmHg. Eyes conjunctiva clear no eyelid edema noted. pupils equal round and reactive to light and accommodation. Ears, Nose, Mouth, and Throat no gross abnormality of ear auricles or external auditory canals. normal hearing noted during conversation. mucus membranes moist. Respiratory normal breathing without difficulty. Cardiovascular 2+ dorsalis pedis/posterior tibialis pulses. no clubbing, cyanosis, significant edema, Musculoskeletal normal gait and posture. no significant deformity or arthritic changes, no loss or range of motion, no clubbing. Psychiatric this patient is able to make decisions and demonstrates good insight into disease process. Alert and Oriented x 3. pleasant and cooperative. General Notes: Upon inspection patient's wound bed actually showed some necrotic tissue over the surface of the wound that was a lot of dried drainage as well as a little bit of eschar. With that being said we were able to spray the stomach with benzocaine and subsequently I was able to remove some of the necrotic tissue from the surface of the wound. The patient did have discomfort  so I had to be very careful nonetheless in the end she had just a very small area which was still remaining open at this point. And this also appears to be very superficial and not deep. With that being said I think she is good to do well at this time as far as treatment is concerned with just simply a Xeroform type gauze dressing. Integumentary (Hair, Skin) Luna, Rachel L. ([id number]) Wound #1 status is Open. Original cause of wound was Gradually Appeared. The wound is located  on the Right Calcaneus. The wound measures 1cm length x 1.2cm width x 0.1cm depth; 0.942cm^2 area and 0.094cm^3 volume. There is Fat Layer (Subcutaneous Tissue) exposed. There is no tunneling or undermining noted. There is a medium amount of serosanguineous drainage noted. There is small (1-33%) red granulation within the wound bed. There is a medium (34-66%) amount of necrotic tissue within the wound bed including Eschar. Assessment Active Problems ICD-10 Type 2 diabetes mellitus with foot ulcer Non-pressure chronic ulcer of other part of right foot with fat layer exposed Essential (primary) hypertension Procedures Wound #1 Pre-procedure diagnosis of Wound #1 is a Diabetic Wound/Ulcer of the Lower Extremity located on the Right Calcaneus .Severity of Tissue Pre Debridement is: Fat layer exposed. There was a Excisional Skin/Subcutaneous Tissue Debridement with a total area of 1.2 sq cm performed by Tommie Sams., PA-C. With the following instrument(s): Curette to remove Viable and Non-Viable tissue/material. Material removed includes Eschar, Subcutaneous Tissue, and Slough. No specimens were taken. A time out was conducted at 10:15, prior to the start of the procedure. A Minimum amount of bleeding was controlled with Pressure. The procedure was tolerated well. Post Debridement Measurements: 1cm length x 1.2cm width x 0.2cm depth; 0.188cm^3 volume. Character of Wound/Ulcer Post Debridement is stable. Severity of Tissue Post Debridement is: Fat layer exposed. Post procedure Diagnosis Wound #1: Same as Pre-Procedure Plan Wound Cleansing: Wound #1 Right Calcaneus: Dial antibacterial soap, wash wounds, rinse and pat dry prior to dressing wounds May Shower, gently pat wound dry prior to applying new dressing. Anesthetic (add to Medication List): Wound #1 Right Calcaneus: Benzocaine Topical Anesthetic Spray applied to wound bed prior to debridement (In Clinic Only). Primary Wound  Dressing: Wound #1 Right Calcaneus: Xeroform gauze Secondary Dressing: Wound #1 Right Calcaneus: Conform/Kerlix - Secured with stretchnet Dressing Change Frequency: Wound #1 Right Calcaneus: Change Dressing Monday, Wednesday, Friday Follow-up Appointments: Wound #1 Right Calcaneus: Return Appointment in 1 week. Edema Control: Wound #1 Right Calcaneus: Elevate legs to the level of the heart and pump ankles as often as possible Off-Loading: Wound #1 Right Calcaneus: Other: - keep pressure off of heel Additional Orders / Instructions: Wound #1 Right Calcaneus: Increase protein intake. Activity as tolerated Rachel Luna, Rachel L. ([id number]) 1. Would recommend that we initiate treatment with Xeroform for the time being and the patient is in agreement with that plan. I think this of it will not stick as well as allowing the wound to heal effectively and quickly. 2. I am also can recommend at this time that we have the patient apply roll gauze to secure in place followed by stretch net so hopefully this will not come off while she is sleeping and so on. 3. I am also can recommend she should be wearing socks and shoes at all times in order to prevent anything from causing this to worsen and also to protect her feet in general also recommended white socks so she has any injury should be able to  notice it on the sock and therefore catch things early not late. Obviously I do not think she should be doing anything barefoot especially helping her friend move I did discuss this with her today I do not want her really to go without shoes at any point in time. We will see patient back for reevaluation in 1 week here in the clinic. If anything worsens or changes patient will contact our office for additional recommendations. Electronic Signature(s) Signed: 02/04/2020 11:12:42 AM By: Worthy Keeler PA-C Entered By: Worthy Keeler on 02/04/2020 11:12:41 Rachel Luna  ([id number]) -------------------------------------------------------------------------------- ROS/PFSH Details Patient Name: Rachel Luna Date of Service: 02/04/2020 9:15 AM Medical Record Number: [id number] Patient Account Number: [id number] Date of Birth/Sex: 11-11-1955 (64 y.o. Female) Treating RN: Dolan Amen Primary Care Provider: Agustina Caroli Other Clinician: Referring Provider: Agustina Caroli Treating Provider/Extender: Skipper Cliche in Treatment: 0 Constitutional Symptoms (General Health) Complaints and Symptoms: Negative for: Fatigue; Fever; Chills; Marked Weight Change Eyes Complaints and Symptoms: Positive for: Glasses / Contacts Negative for: Dry Eyes; Vision Changes Medical History: Negative for: Cataracts; Glaucoma; Optic Neuritis Ear/Nose/Mouth/Throat Complaints and Symptoms: Negative for: Difficult clearing ears; Sinusitis Medical History: Negative for: Chronic sinus problems/congestion; Middle ear problems Hematologic/Lymphatic Complaints and Symptoms: Negative for: Bleeding / Clotting Disorders; Human Immunodeficiency Virus Medical History: Negative for: Anemia; Hemophilia; Human Immunodeficiency Virus; Lymphedema; Sickle Cell Disease Respiratory Complaints and Symptoms: Negative for: Chronic or frequent coughs; Shortness of Breath Medical History: Negative for: Aspiration; Asthma; Chronic Obstructive Pulmonary Disease (COPD); Pneumothorax; Sleep Apnea; Tuberculosis Cardiovascular Complaints and Symptoms: Positive for: LE edema - right leg-nerve damage Negative for: Chest pain Medical History: Positive for: Hypertension Negative for: Angina; Arrhythmia; Congestive Heart Failure; Coronary Artery Disease; Deep Vein Thrombosis; Hypotension; Myocardial Infarction; Peripheral Arterial Disease; Peripheral Venous Disease; Phlebitis; Vasculitis Gastrointestinal Complaints and Symptoms: Negative for: Frequent diarrhea; Nausea; Vomiting Medical  History: Negative for: Cirrhosis ; Colitis; Crohnos; Hepatitis A; Hepatitis B; Hepatitis C Endocrine Complaints and Symptoms: Positive for: Polydypsia (Excessive Thirst) Negative for: Hepatitis; Thyroid disease Rachel Luna, Rachel Luna ([id number]) Medical History: Positive for: Type II Diabetes Time with diabetes: 2-3 years Treated with: Oral agents, Diet Blood sugar tested every day: No Genitourinary Complaints and Symptoms: Negative for: Kidney failure/ Dialysis; Incontinence/dribbling Medical History: Negative for: End Stage Renal Disease Immunological Complaints and Symptoms: Negative for: Hives; Itching Medical History: Negative for: Lupus Erythematosus; Raynaudos; Scleroderma Integumentary (Skin) Complaints and Symptoms: Positive for: Wounds; Bleeding or bruising tendency - bruising; Swelling - right leg Negative for: Breakdown Medical History: Negative for: History of Burn; History of pressure wounds Musculoskeletal Complaints and Symptoms: Positive for: Muscle Pain - right leg; Muscle Weakness - right leg Medical History: Positive for: Osteoarthritis Negative for: Gout; Rheumatoid Arthritis; Osteomyelitis Neurologic Complaints and Symptoms: Positive for: Numbness/parasthesias; Focal/Weakness Medical History: Positive for: Neuropathy; Quadriplegia; Paraplegia Negative for: Dementia; Seizure Disorder Psychiatric Complaints and Symptoms: Positive for: Anxiety; Claustrophobia Medical History: Negative for: Anorexia/bulimia; Confinement Anxiety Oncologic Medical History: Negative for: Received Chemotherapy; Received Radiation Immunizations Pneumococcal Vaccine: Received Pneumococcal Vaccination: Yes Implantable Devices None Rachel Luna, AVANS. ([id number]) Hospitalization / Surgery History Type of Hospitalization/Surgery Cholecystectomy Hysterectomy Back surgery-herniated disc right knee surgery left foot surgery Family and Social History Cancer: Yes - Mother;  Diabetes: Yes - Mother,Father; Heart Disease: No; Hereditary Spherocytosis: No; Hypertension: Yes - Mother,Father; Kidney Disease: Yes - Father; Lung Disease: No; Seizures: No; Stroke: Yes - Mother; Thyroid Problems: No; Tuberculosis: No; Never smoker; Marital Status - Divorced; Alcohol Use: Never; Drug Use: No History;  Caffeine Use: Daily - soft drinks, tea; Financial Concerns: No; Food, Clothing or Shelter Needs: No; Support System Lacking: No; Transportation Concerns: No Electronic Signature(s) Signed: 02/04/2020 3:30:56 PM By: Charlett Nose Signed: 02/04/2020 4:31:57 PM By: Worthy Keeler PA-C Entered By: Georges Mouse, Minus Breeding on 02/04/2020 09:30:08 Rachel Luna ([id number]) -------------------------------------------------------------------------------- [place] Details Patient Name: Rachel Luna. Date of Service: 02/04/2020 Medical Record Number: [id number] Patient Account Number: [id number] Date of Birth/Sex: 1955-06-03 (64 y.o. 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 I10 Essential (primary) hypertension Facility Procedures CPT4 Code: 76811572 Description: [facility] VISIT-LEV 3 EST PT Modifier: Quantity: 1 CPT4 Code: 62035597 Description: 41638 - DEB SUBQ TISSUE 20 SQ CM/< Modifier: Quantity: 1 CPT4 Code: Description: ICD-10 Diagnosis Description L97.512 Non-pressure chronic ulcer of other part of right foot with fat layer ex Modifier: posed Quantity: Physician Procedures CPT4 Code: 4536468 Description: WC PHYS LEVEL 3 o NEW PT Modifier: 25 Quantity: 1 CPT4 Code: Description: ICD-10 Diagnosis Description E11.621 Type  2 diabetes mellitus with foot ulcer L97.512 Non-pressure chronic ulcer of other part of right foot with fat layer ex I10 Essential (primary) hypertension Modifier: posed Quantity: CPT4 Code: 0321224 Description: 11042 - WC PHYS SUBQ TISS 20 SQ CM Modifier: Quantity: 1 CPT4 Code: Description: ICD-10 Diagnosis Description L97.512 Non-pressure chronic ulcer of other part of right foot with fat layer ex Modifier: posed Quantity: Electronic Signature(s) Signed: 02/04/2020 11:13:01 AM By: Worthy Keeler PA-C Entered By: Worthy Keeler on 02/04/2020 11:13:01

## 2020-02-11 ENCOUNTER — Encounter: Payer: Medicare Other | Attending: Physician Assistant | Admitting: Physician Assistant

## 2020-02-11 ENCOUNTER — Other Ambulatory Visit: Payer: Self-pay

## 2020-02-11 DIAGNOSIS — Z841 Family history of disorders of kidney and ureter: Secondary | ICD-10-CM | POA: Insufficient documentation

## 2020-02-11 DIAGNOSIS — L97512 Non-pressure chronic ulcer of other part of right foot with fat layer exposed: Secondary | ICD-10-CM | POA: Insufficient documentation

## 2020-02-11 DIAGNOSIS — Z809 Family history of malignant neoplasm, unspecified: Secondary | ICD-10-CM | POA: Diagnosis not present

## 2020-02-11 DIAGNOSIS — Z833 Family history of diabetes mellitus: Secondary | ICD-10-CM | POA: Diagnosis not present

## 2020-02-11 DIAGNOSIS — Z823 Family history of stroke: Secondary | ICD-10-CM | POA: Insufficient documentation

## 2020-02-11 DIAGNOSIS — M1990 Unspecified osteoarthritis, unspecified site: Secondary | ICD-10-CM | POA: Insufficient documentation

## 2020-02-11 DIAGNOSIS — Z888 Allergy status to other drugs, medicaments and biological substances status: Secondary | ICD-10-CM | POA: Diagnosis not present

## 2020-02-11 DIAGNOSIS — I10 Essential (primary) hypertension: Secondary | ICD-10-CM | POA: Diagnosis not present

## 2020-02-11 DIAGNOSIS — E11621 Type 2 diabetes mellitus with foot ulcer: Secondary | ICD-10-CM | POA: Diagnosis not present

## 2020-02-11 DIAGNOSIS — G8250 Quadriplegia, unspecified: Secondary | ICD-10-CM | POA: Insufficient documentation

## 2020-02-11 DIAGNOSIS — Z8249 Family history of ischemic heart disease and other diseases of the circulatory system: Secondary | ICD-10-CM | POA: Diagnosis not present

## 2020-02-11 DIAGNOSIS — Z9049 Acquired absence of other specified parts of digestive tract: Secondary | ICD-10-CM | POA: Diagnosis not present

## 2020-02-11 DIAGNOSIS — E1151 Type 2 diabetes mellitus with diabetic peripheral angiopathy without gangrene: Secondary | ICD-10-CM | POA: Diagnosis not present

## 2020-02-11 DIAGNOSIS — L97412 Non-pressure chronic ulcer of right heel and midfoot with fat layer exposed: Secondary | ICD-10-CM | POA: Diagnosis not present

## 2020-02-11 NOTE — Progress Notes (Addendum)
CATLYNN, GRONDAHL ([id number]) Visit Report for 02/11/2020 Chief Complaint Document Details Patient Name: Rachel Luna, Rachel Luna. Date of Service: 02/11/2020 10:00 AM Medical Record Number: [id number] Patient Account Number: [id number] Date of Birth/Sex: 1955-04-13 (64 y.o. F) Treating RN: Cornell Barman Primary Care Provider: Agustina Caroli Other Clinician: Referring Provider: Agustina Caroli Treating Provider/Extender: Skipper Cliche in Treatment: 1 Information Obtained from: Patient Chief Complaint Right heel ulcer Electronic Signature(s) Signed: 02/11/2020 10:05:22 AM By: Worthy Keeler PA-C Entered By: Worthy Keeler on 02/11/2020 10:05:22 Rachel Luna ([id number]) -------------------------------------------------------------------------------- HPI Details Patient Name: Rachel Luna Date of Service: 02/11/2020 10:00 AM Medical Record Number: [id number] Patient Account Number: [id number] Date of Birth/Sex: [birth year] (64 y.o. F) Treating RN: Cornell Barman Primary Care Provider: Agustina Caroli Other Clinician: Referring Provider: Agustina Caroli Treating Provider/Extender: Skipper Cliche in Treatment: 1 History of Present Illness HPI Description: 02/04/2020 upon evaluation today patient presents for initial inspection here in our clinic concerning issues that she has been having with her right heel. She states this began when she was helping her friend move. She also tells me that when questioned about wearing shoes and what shoes she was wearing during that time that she does not believe she actually had any on. Nonetheless she is not exactly sure what even happened to the heel she thought it might of started as a blister but to be honest she really has no idea from what she is telling me today. She does relate to me however that she has been having quite a bit of discomfort unfortunately at this site. She has been placed on doxycycline by her primary care provider rather the PA  at the provider's office. She also has been using peroxide on this daily and it for the most part leaving it open although she occasionally covers that she tells me it never stays in place she does have an appointment with vascular next month I am assuming in regard to everything going on here. Her ABIs were 1.14 on the left and 1.28 on the right. Obviously she seems to have good blood flow in my opinion. The patient is diabetic and does have hypertension but otherwise no significant major medical problems noted. 02/11/2020 upon evaluation today patient actually appears to be doing excellent she tells me she is having no discomfort. She does tell me that she actually ended up using the medicine with little bit of gauze behind it and then using duct tape to secure in place and a small strip. She states that still think she get to hold where it needed to be. Everything else just kept coming off. Overall she seems to have done very well which is at least good Electronic Signature(s) Signed: 02/11/2020 11:12:40 AM By: Worthy Keeler PA-C Entered By: Worthy Keeler on 02/11/2020 11:12:40 Rachel Luna ([id number]) -------------------------------------------------------------------------------- Physical Exam Details Patient Name: Rachel Luna. Date of Service: 02/11/2020 10:00 AM Medical Record Number: [id number] Patient Account Number: [id number] Date of Birth/Sex: May 26, 1955 (64 y.o. F) Treating RN: Cornell Barman Primary Care Provider: Agustina Caroli Other Clinician: Referring Provider: Agustina Caroli Treating Provider/Extender: Skipper Cliche in Treatment: 1 Constitutional Well-nourished and well-hydrated in no acute distress. Respiratory normal breathing without difficulty. Psychiatric this patient is able to make decisions and demonstrates good insight into disease process. Alert and Oriented x 3. pleasant and cooperative. Notes Upon inspection patient's wound bed actually showed  signs of good epithelization at this point. There does not appear to be any evidence  of active infection which is great news. No fevers, chills, nausea, vomiting, or diarrhea. Electronic Signature(s) Signed: 02/11/2020 11:14:00 AM By: Worthy Keeler PA-C Entered By: Worthy Keeler on 02/11/2020 11:13:59 Rachel Luna ([id number]) -------------------------------------------------------------------------------- Physician Orders Details Patient Name: Rachel Luna Date of Service: 02/11/2020 10:00 AM Medical Record Number: [id number] Patient Account Number: [id number] Date of Birth/Sex: 06-01-1955 (64 y.o. 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 Date of Service: 02/11/2020 10:00 AM Medical Record Number: [id number] Patient Account Number: [id number] Date of Birth/Sex: 03-Jul-1955 (64 y.o. F) Treating RN: Cornell Barman Primary Care Provider: Agustina Caroli Other Clinician: Referring Provider: Agustina Caroli Treating Provider/Extender: Skipper Cliche in Treatment: 1 Active Problems ICD-10 Encounter Code Description Active Date  MDM Diagnosis E11.621 Type 2 diabetes mellitus with foot ulcer 02/04/2020 No Yes L97.512 Non-pressure chronic ulcer of other part of right foot with fat layer 02/04/2020 No Yes exposed [place] (primary) hypertension 02/04/2020 No Yes Inactive Problems Resolved Problems Electronic Signature(s) Signed: 02/11/2020 10:05:15 AM By: Worthy Keeler PA-C Entered By: Worthy Keeler on 02/11/2020 10:05:15 Rachel Luna ([id number]) -------------------------------------------------------------------------------- Progress Note Details Patient Name: Rachel Luna. Date of Service: 02/11/2020 10:00 AM Medical Record Number: [id number] Patient Account Number: [id number] Date of Birth/Sex: 1955/04/26 (64 y.o. F) Treating RN: Cornell Barman Primary Care Provider: Agustina Caroli Other Clinician: Referring Provider: Agustina Caroli Treating Provider/Extender: Skipper Cliche in Treatment: 1 Subjective Chief Complaint Information obtained from Patient Right heel ulcer History of Present Illness (HPI) 02/04/2020 upon evaluation today patient presents for initial inspection here in our clinic concerning issues that she has been having with her right heel. She states this began when she was helping her friend move. She also tells me that when questioned about wearing shoes and what shoes she was wearing during that time that she does not believe she actually had any on. Nonetheless she is not exactly sure what even happened to the heel she thought it might of started as a blister but to be honest she really has no idea from what she is telling me today. She does relate to me however that she has been having quite a bit of discomfort unfortunately at this site. She has been placed on doxycycline by her primary care provider rather the PA at the provider's office. She also has been using peroxide on this daily and it for the most part leaving it open although she occasionally covers that she tells  me it never stays in place she does have an appointment with vascular next month I am assuming in regard to everything going on here. Her ABIs were 1.14 on the left and 1.28 on the right. Obviously she seems to have good blood flow in my opinion. The patient is diabetic and does have hypertension but otherwise no significant major medical problems noted. 02/11/2020 upon  evaluation today patient actually appears to be doing excellent she tells me she is having no discomfort. She does tell me that she actually ended up using the medicine with little bit of gauze behind it and then using duct tape to secure in place and a small strip. She states that still think she get to hold where it needed to be. Everything else just kept coming off. Overall she seems to have done very well which is at least good Objective Constitutional Well-nourished and well-hydrated in no acute distress. Vitals Time Taken: 10:10 AM, Height: 64 in, Weight: 278 lbs, BMI: 47.7, Temperature: 98.2 F, Pulse: 76 bpm, Respiratory Rate: 20 breaths/min, Blood Pressure: 130/80 mmHg. Respiratory normal breathing without difficulty. Psychiatric this patient is able to make decisions and demonstrates good insight into disease process. Alert and Oriented x 3. pleasant and cooperative. General Notes: Upon inspection patient's wound bed actually showed signs of good epithelization at this point. There does not appear to be any evidence of active infection which is great news. No fevers, chills, nausea, vomiting, or diarrhea. Integumentary (Hair, Skin) Wound #1 status is Healed - Epithelialized. Original cause of wound was Gradually Appeared. The wound is located on the Right Calcaneus. The wound measures 0cm length x 0cm width x 0cm depth; 0cm^2 area and 0cm^3 volume. There is no tunneling or undermining noted. There is a small amount of serous drainage noted. There is no granulation within the wound bed. There is no necrotic tissue within  the wound bed. Assessment Active Problems ICD-10 Type 2 diabetes mellitus with foot ulcer Non-pressure chronic ulcer of other part of right foot with fat layer exposed Mignone, Kyanne L. ([id number]) Essential (primary) hypertension Plan Discharge From [facility] Services: Wound #1 Right Calcaneus: Discharge from [place] - treatment complete 1. Would recommend currently that we discontinue wound care services as the patient does appear to be completely healed. 2. I am good I however recommend for the patient currently that we have her use a piece of foam secured with a Band-Aid over the area. Obviously I would do this for 2 weeks in order to help prevent anything from worsening or pushing on this region. I think that is good to be fairly important. We will see the patient back for follow-up visit as needed Electronic Signature(s) Signed: 02/11/2020 11:14:57 AM By: Worthy Keeler PA-C Entered By: Worthy Keeler on 02/11/2020 11:14:56 Rachel Luna ([id number]) -------------------------------------------------------------------------------- [place] Details Patient Name: Rachel Luna. Date of Service: 02/11/2020 Medical Record Number: [id number] Patient Account Number: [id number] Date of Birth/Sex: [birth year] (64 y.o. F) Treating RN: Dolan Amen Primary Care Provider: Agustina Caroli Other Clinician: Referring Provider: Agustina Caroli Treating Provider/Extender: Skipper Cliche in Treatment: 1 Diagnosis Coding ICD-10 Codes Code Description E11.621 Type 2 diabetes mellitus with foot ulcer L97.512 Non-pressure chronic ulcer of other part of right foot with fat layer exposed I10 Essential (primary) hypertension Facility Procedures CPT4 Code: [id number] Description: 99213 - WOUND CARE VISIT-LEV 3 EST PT Modifier: Quantity: 1 Physician Procedures CPT4 Code: 4315400 Description: 86761 - WC PHYS LEVEL 3 - EST PT Modifier: Quantity: 1 CPT4 Code: Description: ICD-10  Diagnosis Description E11.621 Type 2 diabetes mellitus with foot ulcer L97.512 Non-pressure chronic ulcer of other part of right foot with fat layer e I10 Essential (primary) hypertension Modifier: xposed Quantity: Electronic Signature(s) Signed: 02/11/2020 11:15:11 AM By: Worthy Keeler PA-C Entered By: Worthy Keeler on 02/11/2020 11:15:11

## 2020-02-11 NOTE — Progress Notes (Addendum)
Rachel Luna, Rachel Luna ([id number]) Visit Report for 02/11/2020 Arrival Information Details Patient Name: Rachel Luna, Rachel Luna. Date of Service: 02/11/2020 10:00 AM Medical Record Number: [id number] Patient Account Number: [id number] Date of Birth/Sex: 1955/07/02 (64 y.o. F) Treating RN: Dolan Amen Primary Care Tremeka Helbling: Agustina Caroli Other Clinician: Referring Tamarick Kovalcik: Agustina Caroli Treating Loran Fleet/Extender: Skipper Cliche in Treatment: 1 Visit Information History Since Last Visit Pain Present Now: No Patient Arrived: Ambulatory Arrival Time: 10:10 Accompanied By: self Transfer Assistance: None Patient Has Alerts: Yes Patient Alerts: Type II Diabetic Electronic Signature(s) Signed: 02/11/2020 2:10:08 PM By: Georges Mouse, Minus Breeding Entered By: Georges Mouse, Minus Breeding on 02/11/2020 10:11:01 Rachel Luna ([id number]) -------------------------------------------------------------------------------- Clinic Level of Care Assessment Details Patient Name: Rachel Luna. Date of Service: 02/11/2020 10:00 AM Medical Record Number: [id number] Patient Account Number: [id number] Date of Birth/Sex: Nov 19, 1955 (64 y.o. F) Treating RN: Dolan Amen Primary Care Shanequia Kendrick: Agustina Caroli Other Clinician: Referring Spruha Weight: Agustina Caroli Treating Adisa Litt/Extender: Skipper Cliche in Treatment: 1 Clinic Level of Care Assessment Items TOOL 4 Quantity Score X - Use when only an EandM is performed on FOLLOW-UP visit 1 0 ASSESSMENTS - Nursing Assessment / Reassessment X - Reassessment of Co-morbidities (includes updates in patient status) 1 10 X- 1 5 Reassessment of Adherence to Treatment Plan ASSESSMENTS - Wound and Skin Assessment / Reassessment X - Simple Wound Assessment / Reassessment - one wound 1 5 []  - 0 Complex Wound Assessment / Reassessment - multiple wounds []  - 0 Dermatologic / Skin Assessment (not related to wound area) ASSESSMENTS - Focused Assessment []  -  Circumferential Edema Measurements - multi extremities 0 []  - 0 Nutritional Assessment / Counseling / Intervention X- 1 5 Lower Extremity Assessment (monofilament, tuning fork, pulses) []  - 0 Peripheral Arterial Disease Assessment (using hand held doppler) ASSESSMENTS - Ostomy and/or Continence Assessment and Care []  - Incontinence Assessment and Management 0 []  - 0 Ostomy Care Assessment and Management (repouching, etc.) PROCESS - Coordination of Care X - Simple Patient / Family Education for ongoing care 1 15 []  - 0 Complex (extensive) Patient / Family Education for ongoing care []  - 0 Staff obtains Programmer, systems, Records, Test Results / Process Orders []  - 0 Staff telephones HHA, Nursing Homes / Clarify orders / etc []  - 0 Routine Transfer to another Facility (non-emergent condition) []  - 0 Routine Hospital Admission (non-emergent condition) []  - 0 New Admissions / Biomedical engineer / Ordering NPWT, Apligraf, etc. []  - 0 Emergency Hospital Admission (emergent condition) X- 1 10 Simple Discharge Coordination []  - 0 Complex (extensive) Discharge Coordination PROCESS - Special Needs []  - Pediatric / Minor Patient Management 0 []  - 0 Isolation Patient Management []  - 0 Hearing / Language / Visual special needs []  - 0 Assessment of Community assistance (transportation, D/C planning, etc.) []  - 0 Additional assistance / Altered mentation []  - 0 Support Surface(s) Assessment (bed, cushion, seat, etc.) INTERVENTIONS - Wound Cleansing / Measurement Goetsch, Barbera L. ([id number]) X- 1 5 Simple Wound Cleansing - one wound []  - 0 Complex Wound Cleansing - multiple wounds X- 1 5 Wound Imaging (photographs - any number of wounds) []  - 0 Wound Tracing (instead of photographs) X- 1 5 Simple Wound Measurement - one wound []  - 0 Complex Wound Measurement - multiple wounds INTERVENTIONS - Wound Dressings X - Small Wound Dressing one or multiple wounds 1 10 []  - 0 Medium  Wound Dressing one or multiple wounds []  - 0 Large Wound Dressing one or multiple wounds []  - 0 Application  of Medications - topical []  - 0 Application of Medications - injection INTERVENTIONS - Miscellaneous []  - External ear exam 0 []  - 0 Specimen Collection (cultures, biopsies, blood, body fluids, etc.) []  - 0 Specimen(s) / Culture(s) sent or taken to Lab for analysis []  - 0 Patient Transfer (multiple staff / Harrel Lemon Lift / Similar devices) []  - 0 Simple Staple / Suture removal (25 or less) []  - 0 Complex Staple / Suture removal (26 or more) []  - 0 Hypo / Hyperglycemic Management (close monitor of Blood Glucose) []  - 0 Ankle / Brachial Index (ABI) - do not check if billed separately X- 1 5 Vital Signs Has the patient been seen at the hospital within the last three years: Yes Total Score: 80 Level Of Care: New/Established - Level 3 Electronic Signature(s) Signed: 02/11/2020 2:10:08 PM By: Georges Mouse, Minus Breeding Entered By: Georges Mouse, Minus Breeding on 02/11/2020 10:35:47 Rachel Luna ([id number]) -------------------------------------------------------------------------------- Encounter Discharge Information Details Patient Name: Rachel Luna. Date of Service: 02/11/2020 10:00 AM Medical Record Number: [id number] Patient Account Number: [id number] Date of Birth/Sex: [birth year] (64 y.o. F) Treating RN: Dolan Amen Primary Care Marysue Fait: Agustina Caroli Other Clinician: Referring Monterrio Gerst: Agustina Caroli Treating Jester Klingberg/Extender: Skipper Cliche in Treatment: 1 Encounter Discharge Information Items Discharge Condition: Stable Ambulatory Status: Ambulatory Discharge Destination: Home Transportation: Private Auto Accompanied By: self Schedule Follow-up Appointment: No Clinical Summary of Care: Electronic Signature(s) Signed: 02/11/2020 2:10:08 PM By: Georges Mouse, Minus Breeding Entered By: Georges Mouse, Minus Breeding on 02/11/2020 10:37:12 Rachel Luna  ([id number]) -------------------------------------------------------------------------------- Lower Extremity Assessment Details Patient Name: Rachel Luna. Date of Service: 02/11/2020 10:00 AM Medical Record Number: [id number] Patient Account Number: [id number] Date of Birth/Sex: [birth year] (64 y.o. F) Treating RN: Dolan Amen Primary Care Axton Cihlar: Agustina Caroli Other Clinician: Referring Duchess Armendarez: Agustina Caroli Treating Naftuli Dalsanto/Extender: Jeri Cos Weeks in Treatment: 1 Edema Assessment Assessed: [Left: No] [Right: Yes] Edema: [Left: N] [Right: o] Calf Left: Right: Point of Measurement: 33 cm From Medial Instep 44 cm Ankle Left: Right: Point of Measurement: 10 cm From Medial Instep 24 cm Vascular Assessment Pulses: Dorsalis Pedis Palpable: [Right:Yes] Electronic Signature(s) Signed: 02/11/2020 2:10:08 PM By: Georges Mouse, Minus Breeding Entered By: Georges Mouse, Minus Breeding on 02/11/2020 10:19:50 Rachel Luna ([id number]) -------------------------------------------------------------------------------- Multi Wound Chart Details Patient Name: Rachel Luna. Date of Service: 02/11/2020 10:00 AM Medical Record Number: [id number] Patient Account Number: [id number] Date of Birth/Sex: [birth year] (64 y.o. F) Treating RN: Dolan Amen Primary Care Nakeeta Sebastiani: Agustina Caroli Other Clinician: Referring Shenelle Klas: Agustina Caroli Treating Aashvi Rezabek/Extender: Skipper Cliche in Treatment: 1 Vital Signs Height(in): 64 Pulse(bpm): 60 Weight(lbs): 278 Blood Pressure(mmHg): 130/80 Body Mass Index(BMI): 48 Temperature(F): 98.2 Respiratory Rate(breaths/min): 20 Photos: [N/A:N/A] Wound Location: Right Calcaneus N/A N/A Wounding Event: Gradually Appeared N/A N/A Primary Etiology: Diabetic Wound/Ulcer of the Lower N/A N/A Extremity Comorbid History: Hypertension, Type II Diabetes, N/A N/A Osteoarthritis, Neuropathy, Quadriplegia, Paraplegia Date Acquired: 01/19/2020 N/A  N/A Weeks of Treatment: 1 N/A N/A Wound Status: Healed - Epithelialized N/A N/A Measurements L x W x D (cm) 0x0x0 N/A N/A Area (cm) : 0 N/A N/A Volume (cm) : 0 N/A N/A % Reduction in Area: 100.00% N/A N/A % Reduction in Volume: 100.00% N/A N/A Classification: Grade 1 N/A N/A Exudate Amount: Small N/A N/A Exudate Type: Serous N/A N/A Exudate Color: amber N/A N/A Granulation Amount: None Present (0%) N/A N/A Necrotic Amount: None Present (0%) N/A N/A Exposed Structures: Fascia: No N/A N/A Fat Layer (Subcutaneous Tissue): No Tendon: No Muscle: No Joint: No Bone:  No Epithelialization: Large (67-100%) N/A N/A Treatment Notes Electronic Signature(s) Signed: 02/11/2020 2:10:08 PM By: Georges Mouse, Kenia Entered By: Georges Mouse, Kenia on 02/11/2020 10:33:49 Rachel Luna ([id number]) -------------------------------------------------------------------------------- [place] Details Patient Name: Rachel Luna. Date of Service: 02/11/2020 10:00 AM Medical Record Number: [id number] Patient Account Number: [id number] Date of Birth/Sex: July 02, 1955 (64 y.o. F) Treating RN: Dolan Amen Primary Care Cuma Polyakov: Agustina Caroli Other Clinician: Referring Fatou Dunnigan: Agustina Caroli Treating Natajah Derderian/Extender: Jeri Cos Weeks in Treatment: 1 Active Inactive Electronic Signature(s) Signed: 02/11/2020 2:10:08 PM By: Georges Mouse, Minus Breeding Entered By: Georges Mouse, Minus Breeding on 02/11/2020 10:35:01 Rachel Luna ([id number]) -------------------------------------------------------------------------------- Pain Assessment Details Patient Name: Rachel Luna. Date of Service: 02/11/2020 10:00 AM Medical Record Number: [id number] Patient Account Number: [id number] Date of Birth/Sex: [birth year] (64 y.o. F) Treating RN: Dolan Amen Primary Care Gissella Niblack: Agustina Caroli Other Clinician: Referring Laiya Wisby: Agustina Caroli Treating Orvella Digiulio/Extender: Skipper Cliche in Treatment: 1 Active Problems Location of Pain Severity and Description of Pain Patient Has Paino No Site Locations Rate the pain. Current Pain Level: 0 Pain Management and Medication Current Pain Management: Electronic Signature(s) Signed: 02/11/2020 2:10:08 PM By: Georges Mouse, Kenia Entered By: Georges Mouse, Minus Breeding on 02/11/2020 10:12:01 Rachel Luna ([id number]) -------------------------------------------------------------------------------- Patient/Caregiver Education Details Patient Name: Rachel Luna. Date of Service: 02/11/2020 10:00 AM Medical Record Number: [id number] Patient Account Number: [id number] Date of Birth/Gender: 03-Aug-1955 (63 y.o. F) Treating RN: Dolan Amen Primary Care Physician: Agustina Caroli Other Clinician: Referring Physician: Agustina Caroli Treating Physician/Extender: Skipper Cliche in Treatment: 1 Education Assessment Education Provided To: Patient Education Topics Provided Notes protect new skin with foam, reinforced importance of wearing socks/shoes as a diabetic Electronic Signature(s) Signed: 02/11/2020 2:10:08 PM By: Georges Mouse, Minus Breeding Entered By: Georges Mouse, Minus Breeding on 02/11/2020 10:36:45 Rachel Luna ([id number]) -------------------------------------------------------------------------------- Wound Assessment Details Patient Name: Rachel Luna. Date of Service: 02/11/2020 10:00 AM Medical Record Number: [id number] Patient Account Number: [id number] Date of Birth/Sex: 08-Dec-1955 (64 y.o. F) Treating RN: Dolan Amen Primary Care Abbie Jablon: Agustina Caroli Other Clinician: Referring Maddeline Roorda: Agustina Caroli Treating Khara Renaud/Extender: Skipper Cliche in Treatment: 1 Wound Status Wound Number: 1 Primary Diabetic Wound/Ulcer of the Lower Extremity Etiology: Wound Location: Right Calcaneus Wound Healed - Epithelialized Wounding Event: Gradually Appeared Status: Date Acquired:  01/19/2020 Comorbid Hypertension, Type II Diabetes, Osteoarthritis, Weeks Of Treatment: 1 History: Neuropathy, Quadriplegia, Paraplegia Clustered Wound: No Photos Wound Measurements Length: (cm) 0 Width: (cm) 0 Depth: (cm) 0 Area: (cm) 0 Volume: (cm) 0 % Reduction in Area: 100% % Reduction in Volume: 100% Epithelialization: Large (67-100%) Tunneling: No Undermining: No Wound Description Classification: Grade 1 Exudate Amount: Small Exudate Type: Serous Exudate Color: amber Foul Odor After Cleansing: No Slough/Fibrino No Wound Bed Granulation Amount: None Present (0%) Exposed Structure Necrotic Amount: None Present (0%) Fascia Exposed: No Fat Layer (Subcutaneous Tissue) Exposed: No Tendon Exposed: No Muscle Exposed: No Joint Exposed: No Bone Exposed: No Electronic Signature(s) Signed: 02/11/2020 2:10:08 PM By: Georges Mouse, Minus Breeding Entered By: Georges Mouse, Minus Breeding on 02/11/2020 10:29:28 Rachel Luna ([id number]) -------------------------------------------------------------------------------- Vitals Details Patient Name: Rachel Luna. Date of Service: 02/11/2020 10:00 AM Medical Record Number: [id number] Patient Account Number: [id number] Date of Birth/Sex: 08-13-1955 (64 y.o. F) Treating RN: Dolan Amen Primary Care Makendra Vigeant: Agustina Caroli Other Clinician: Referring Adryanna Friedt: Agustina Caroli Treating Danniel Tones/Extender: Skipper Cliche in Treatment: 1 Vital Signs Time Taken: 10:10 Temperature (F): 98.2 Height (in): 64 Pulse (bpm): 76 Weight (lbs): 278 Respiratory Rate (breaths/min): 20 Body Mass  Index (BMI): 47.7 Blood Pressure (mmHg): 130/80 Reference Range: 80 - 120 mg / dl Electronic Signature(s) Signed: 02/11/2020 2:10:08 PM By: Georges Mouse, Kenia Entered By: Georges Mouse, Minus Breeding on 02/11/2020 10:11:36

## 2020-02-13 ENCOUNTER — Other Ambulatory Visit: Payer: Self-pay | Admitting: Emergency Medicine

## 2020-02-13 ENCOUNTER — Other Ambulatory Visit (INDEPENDENT_AMBULATORY_CARE_PROVIDER_SITE_OTHER): Payer: Self-pay | Admitting: Vascular Surgery

## 2020-02-13 DIAGNOSIS — E1165 Type 2 diabetes mellitus with hyperglycemia: Secondary | ICD-10-CM

## 2020-02-13 DIAGNOSIS — E11621 Type 2 diabetes mellitus with foot ulcer: Secondary | ICD-10-CM

## 2020-02-14 ENCOUNTER — Other Ambulatory Visit: Payer: Self-pay | Admitting: *Deleted

## 2020-02-14 MED ORDER — BACLOFEN 10 MG PO TABS: 10.0000 mg | ORAL_TABLET | Freq: Three times a day (TID) | ORAL | 1 refills | Status: DC

## 2020-02-14 MED ORDER — CARBAMAZEPINE 200 MG PO TABS
100.0000 mg | ORAL_TABLET | Freq: Every day | ORAL | 1 refills | Status: DC
Start: 2020-02-14 — End: 2020-08-05

## 2020-02-14 MED ORDER — CLONAZEPAM 0.5 MG PO TABS: 0.5000 mg | ORAL_TABLET | Freq: Every day | ORAL | 3 refills | Status: DC

## 2020-02-18 ENCOUNTER — Other Ambulatory Visit: Payer: Self-pay

## 2020-02-18 ENCOUNTER — Encounter (INDEPENDENT_AMBULATORY_CARE_PROVIDER_SITE_OTHER): Payer: Self-pay | Admitting: Vascular Surgery

## 2020-02-18 ENCOUNTER — Ambulatory Visit (INDEPENDENT_AMBULATORY_CARE_PROVIDER_SITE_OTHER): Payer: Medicare Other

## 2020-02-18 ENCOUNTER — Ambulatory Visit (INDEPENDENT_AMBULATORY_CARE_PROVIDER_SITE_OTHER): Payer: Medicare Other | Admitting: Vascular Surgery

## 2020-02-18 VITALS — BP 155/77 | HR 78 | Resp 16 | Wt 277.8 lb

## 2020-02-18 DIAGNOSIS — E785 Hyperlipidemia, unspecified: Secondary | ICD-10-CM

## 2020-02-18 DIAGNOSIS — I739 Peripheral vascular disease, unspecified: Secondary | ICD-10-CM

## 2020-02-18 DIAGNOSIS — E11621 Type 2 diabetes mellitus with foot ulcer: Secondary | ICD-10-CM

## 2020-02-18 DIAGNOSIS — E1169 Type 2 diabetes mellitus with other specified complication: Secondary | ICD-10-CM | POA: Diagnosis not present

## 2020-02-18 DIAGNOSIS — L97411 Non-pressure chronic ulcer of right heel and midfoot limited to breakdown of skin: Secondary | ICD-10-CM

## 2020-02-18 DIAGNOSIS — E782 Mixed hyperlipidemia: Secondary | ICD-10-CM

## 2020-02-18 DIAGNOSIS — I10 Essential (primary) hypertension: Secondary | ICD-10-CM | POA: Diagnosis not present

## 2020-02-18 DIAGNOSIS — I872 Venous insufficiency (chronic) (peripheral): Secondary | ICD-10-CM | POA: Diagnosis not present

## 2020-02-18 NOTE — Progress Notes (Signed)
MRN : [id number]  Rachel Luna is a 64 y.o. (Jul 02, 1955) female who presents with chief complaint of  Chief Complaint  Patient presents with  . New Patient (Initial Visit)    Ref [place] ulcer.abi  .  History of Present Illness:    The patient is seen for evaluation of painful lower extremities and diminished pulses. Patient notes the pain is always associated with activity and is very consistent day today. Typically, the pain occurs at less than one block, progress is as activity continues to the point that the patient must stop walking. Resting including standing still for several minutes allowed resumption of the activity and the ability to walk a similar distance before stopping again. Uneven terrain and inclined shorten the distance. The pain has been progressive over the past several years.   The patient denies rest pain or dangling of an extremity off the side of the bed during the night for relief. No open wounds or sores at this time.  However, earlier this year she did have a right heel ulcer which is now closed.  She was followed by the wound center.  No prior interventions or surgeries.  Significant history of back problems of the lumbar sacral spine.  In 2005 she fell injuring her back requiring surgery.  Since then she has struggled with neuropathy of the right leg.  The patient denies changes in claudication symptoms or new rest pain symptoms.  No new ulcers or wounds of the foot.  The patient's blood pressure has been stable and relatively well controlled. The patient denies amaurosis fugax or recent TIA symptoms. There are no recent neurological changes noted. The patient denies history of DVT, PE or superficial thrombophlebitis. The patient denies recent episodes of angina or shortness of breath.   ABI's Rt=1.14 and Lt=1.19 (Triphasic right PT and left PT and DP; monophasic right DP)  Toe tracings are normal  Current Meds  Medication Sig  . ACCU-CHEK AVIVA PLUS  test strip Use in the morning and after meals as needed.  Marland Kitchen amLODipine-valsartan (EXFORGE) 5-160 MG tablet Take 1 tablet by mouth daily.  . baclofen (LIORESAL) 10 MG tablet Take 1 tablet (10 mg total) by mouth 3 (three) times daily.  . carbamazepine (TEGRETOL) 200 MG tablet Take 0.5 tablets (100 mg total) by mouth daily.  . cetirizine (ZYRTEC) 10 MG tablet Take 10 mg by mouth daily.  . clonazePAM (KLONOPIN) 0.5 MG tablet Take 1 tablet (0.5 mg total) by mouth at bedtime.  Marland Kitchen doxycycline (VIBRA-TABS) 100 MG tablet Take 1 tablet (100 mg total) by mouth 2 (two) times daily.  . DULoxetine (CYMBALTA) 30 MG capsule Take 1 capsule (30 mg total) by mouth daily. To be combined with 60 mg  . DULoxetine (CYMBALTA) 60 MG capsule Take 1 capsule (60 mg total) by mouth daily. To be combined with 30 mg  . estradiol (ESTRACE) 0.5 MG tablet Take 1 tablet (0.5 mg total) by mouth daily.  Marland Kitchen gabapentin (NEURONTIN) 800 MG tablet Take 1 tablet (800 mg total) by mouth 3 (three) times daily.  Marland Kitchen glimepiride (AMARYL) 4 MG tablet Take 1 tablet (4 mg total) by mouth daily with breakfast.  . JANUVIA 100 MG tablet TAKE ONE TABLET BY MOUTH ONCE DAILY  . meloxicam (MOBIC) 15 MG tablet Take 15 mg by mouth daily.   . pramipexole (MIRAPEX) 0.75 MG tablet One tablet three times a day    Past Medical History:  Diagnosis Date  . Anxiety   .  Arthritis   . Bipolar disorder ([place])   . chronic low back pain   . Chronic low back pain 02/18/2014  . Depression   . Diabetes mellitus without complication ([place])   . Diabetes mellitus, type II ([place])   . Fibroids   . GERD (gastroesophageal reflux disease)   . Gout   . Hyperlipidemia   . Hypertension   . Obesity     Past Surgical History:  Procedure Laterality Date  . ABDOMINAL HYSTERECTOMY    . CHOLECYSTECTOMY    . FOOT SURGERY Left   . KNEE ARTHROPLASTY    . OVARIAN CYST REMOVAL    . SPINE SURGERY      Social History Social History   Tobacco Use  . Smoking status: Passive  Smoke Exposure - Never Smoker  . Smokeless tobacco: Never Used  . Tobacco comment: room mate smokes  Vaping Use  . Vaping Use: Never used  Substance Use Topics  . Alcohol use: No    Alcohol/week: 0.0 standard drinks  . Drug use: No    Family History Family History  Problem Relation Age of Onset  . Diabetes Mother   . Cancer Mother        breast  . Heart disease Mother   . Mental illness Mother        bipolar disorder  . Stroke Mother   . Breast cancer Mother   . Hypertension Mother   . High Cholesterol Mother   . Diabetes Father   . Hypertension Father   . Diabetes Brother   . Bipolar disorder Brother   . Diabetes Brother   . Breast cancer Maternal Aunt   . Breast cancer Maternal Grandmother     Allergies  Allergen Reactions  . Belsomra [Suvorexant] Other (See Comments)    Vivid dreams  . Wellbutrin [Bupropion] Other (See Comments)    Irritability and Agitation  . Geodon [Ziprasidone Hcl] Other (See Comments)    Worsened Restless Leg Syndrome  . Lisinopril Cough  . Neupro [Rotigotine] Rash     REVIEW OF SYSTEMS (Negative unless checked)  Constitutional: [] Weight loss  [] Fever  [] Chills Cardiac: [] Chest pain   [] Chest pressure   [] Palpitations   [] Shortness of breath when laying flat   [] Shortness of breath with exertion. Vascular:  [x] Pain in legs with walking   [x] Pain in legs at rest  [] History of DVT   [] Phlebitis   [x] Swelling in legs   [] Varicose veins   [] Non-healing ulcers Pulmonary:   [] Uses home oxygen   [] Productive cough   [] Hemoptysis   [] Wheeze  [] COPD   [] Asthma Neurologic:  [] Dizziness   [] Seizures   [] History of stroke   [] History of TIA  [] Aphasia   [] Vissual changes   [] Weakness or numbness in arm   [x] Weakness or numbness in leg Musculoskeletal:   [] Joint swelling   [] Joint pain   [] Low back pain Hematologic:  [] Easy bruising  [] Easy bleeding   [] Hypercoagulable state   [] Anemic Gastrointestinal:  [] Diarrhea   [] Vomiting  [] Gastroesophageal  reflux/heartburn   [] Difficulty swallowing. Genitourinary:  [] Chronic kidney disease   [] Difficult urination  [] Frequent urination   [] Blood in urine Skin:  [x] Rashes   [] Ulcers  Psychological:  [x] History of anxiety   []  History of major depression.  Physical Examination  Vitals:   02/18/20 0826  BP: (!) 155/77  Pulse: 78  Resp: 16  Weight: 277 lb 12.8 oz (126 kg)   Body mass index is 46.95 kg/m. Gen: WD/WN, NAD Head: [place]/AT,  No temporalis wasting.  Ear/Nose/Throat: Hearing grossly intact, nares w/o erythema or drainage, poor dentition Eyes: PER, EOMI, sclera nonicteric.  Neck: Supple, no masses.  No bruit or JVD.  Pulmonary:  Good air movement, clear to auscultation bilaterally, no use of accessory muscles.  Cardiac: RRR, normal S1, S2, no Murmurs. Vascular: scattered varicosities present bilaterally.  Moderate venous stasis changes to the legs bilaterally.  3+ soft pitting edema Vessel Right Left  Radial Palpable Palpable  PT Palpable Palpable  DP Not Palpable Palpable  Gastrointestinal: soft, non-distended. No guarding/no peritoneal signs.  Musculoskeletal: M/S 5/5 throughout.  No deformity or atrophy.  Neurologic: CN 2-12 intact. Pain and light touch intact in extremities.  Symmetrical.  Speech is fluent. Motor exam as listed above. Psychiatric: Judgment intact, Mood & affect appropriate for pt's clinical situation. Dermatologic: Moderate venous rashes no ulcers noted.  No changes consistent with cellulitis. Lymph : Moderate lichenification and skin changes of chronic lymphedema toes and ankles bilaterally.  CBC Lab Results  Component Value Date   WBC 7.8 12/11/2019   HGB 12.5 12/11/2019   HCT 39.6 12/11/2019   MCV 90 12/11/2019   PLT 228 12/11/2019    BMET    Component Value Date/Time   NA 144 12/11/2019 1207   K 3.7 12/11/2019 1207   CL 103 12/11/2019 1207   CO2 27 12/11/2019 1207   GLUCOSE 103 (H) 12/11/2019 1207   GLUCOSE 197 (H) 11/13/2019 1131   GLUCOSE  126 (H) 03/25/2014 0811   BUN 7 (L) 12/11/2019 1207   CREATININE 0.64 12/11/2019 1207   CALCIUM 9.1 12/11/2019 1207   GFRNONAA 96 12/11/2019 1207   GFRAA 111 12/11/2019 1207   CrCl cannot be calculated (Patient's most recent lab result is older than the maximum 21 days allowed.).  COAG No results found for: INR, PROTIME  Radiology No results found.   Assessment/Plan 1. PAD (peripheral artery disease) (HCC)  Recommend:  The patient has evidence of atherosclerosis of the lower extremities with claudication.  The patient does not voice lifestyle limiting changes at this point in time.  Noninvasive studies do not suggest clinically significant issue.  ABI's Rt=1.14 and Lt=1.19 (Triphasic right PT and left PT and DP; monophasic right DP)  Toe tracings are normal  No invasive studies, angiography or surgery at this time The patient should continue walking and begin a more formal exercise program.  The patient should continue antiplatelet therapy and aggressive treatment of the lipid abnormalities  No changes in the patient's medications at this time  The patient should continue wearing graduated compression socks 10-15 mmHg strength to control the mild edema.   - VAS [place] ABI WITH/WO TBI; Future  2. Chronic venous insufficiency  Recommend:  The patient has large symptomatic varicose veins that are painful and associated with swelling.  I have had a long discussion with the patient regarding  varicose veins and why they cause symptoms.  Patient will begin wearing graduated compression stockings class 1 on a daily basis, beginning first thing in the morning and removing them in the evening. The patient is instructed specifically not to sleep in the stockings.    The patient  will also begin using over-the-counter analgesics such as Motrin 600 mg po TID to help control the symptoms.    In addition, behavioral modification including elevation during the day will be initiated.     Pending the results of these changes the  patient will be reevaluated in 12 months.  3. Essential (primary) hypertension Continue antihypertensive medications as already ordered, these medications have been reviewed and there are no changes at this time.   4. Dyslipidemia associated with type 2 diabetes mellitus ([place]) Continue hypoglycemic medications as already ordered, these medications have been reviewed and there are no changes at this time.  Hgb A1C to be monitored as already arranged by primary service   5. Mixed hyperlipidemia Continue statin as ordered and reviewed, no changes at this time   Hortencia Pilar, MD  02/18/2020 9:02 AM

## 2020-03-04 ENCOUNTER — Other Ambulatory Visit: Payer: Self-pay | Admitting: *Deleted

## 2020-03-04 MED ORDER — PRAMIPEXOLE DIHYDROCHLORIDE 0.75 MG PO TABS: ORAL_TABLET | ORAL | 1 refills | Status: DC

## 2020-03-12 ENCOUNTER — Other Ambulatory Visit: Payer: Self-pay | Admitting: Emergency Medicine

## 2020-03-12 DIAGNOSIS — E1165 Type 2 diabetes mellitus with hyperglycemia: Secondary | ICD-10-CM

## 2020-03-17 ENCOUNTER — Ambulatory Visit (INDEPENDENT_AMBULATORY_CARE_PROVIDER_SITE_OTHER): Payer: Medicare Other | Admitting: Emergency Medicine

## 2020-03-17 ENCOUNTER — Encounter: Payer: Self-pay | Admitting: Emergency Medicine

## 2020-03-17 ENCOUNTER — Other Ambulatory Visit: Payer: Self-pay

## 2020-03-17 VITALS — BP 133/79 | HR 78 | Temp 98.0°F | Resp 16 | Ht 64.0 in | Wt 270.0 lb

## 2020-03-17 DIAGNOSIS — F1321 Sedative, hypnotic or anxiolytic dependence, in remission: Secondary | ICD-10-CM | POA: Insufficient documentation

## 2020-03-17 DIAGNOSIS — E785 Hyperlipidemia, unspecified: Secondary | ICD-10-CM | POA: Diagnosis not present

## 2020-03-17 DIAGNOSIS — I739 Peripheral vascular disease, unspecified: Secondary | ICD-10-CM | POA: Diagnosis not present

## 2020-03-17 DIAGNOSIS — I152 Hypertension secondary to endocrine disorders: Secondary | ICD-10-CM

## 2020-03-17 DIAGNOSIS — G2581 Restless legs syndrome: Secondary | ICD-10-CM | POA: Diagnosis not present

## 2020-03-17 DIAGNOSIS — E278 Other specified disorders of adrenal gland: Secondary | ICD-10-CM | POA: Diagnosis not present

## 2020-03-17 DIAGNOSIS — E1169 Type 2 diabetes mellitus with other specified complication: Secondary | ICD-10-CM | POA: Diagnosis not present

## 2020-03-17 DIAGNOSIS — E1159 Type 2 diabetes mellitus with other circulatory complications: Secondary | ICD-10-CM | POA: Diagnosis not present

## 2020-03-17 DIAGNOSIS — F3160 Bipolar disorder, current episode mixed, unspecified: Secondary | ICD-10-CM

## 2020-03-17 DIAGNOSIS — G40909 Epilepsy, unspecified, not intractable, without status epilepticus: Secondary | ICD-10-CM

## 2020-03-17 DIAGNOSIS — Z6841 Body Mass Index (BMI) 40.0 and over, adult: Secondary | ICD-10-CM

## 2020-03-17 DIAGNOSIS — E1165 Type 2 diabetes mellitus with hyperglycemia: Secondary | ICD-10-CM | POA: Diagnosis not present

## 2020-03-17 LAB — COMPREHENSIVE METABOLIC PANEL
ALT: 14 IU/L (ref 0–32)
AST: 17 IU/L (ref 0–40)
Albumin/Globulin Ratio: 1.6 (ref 1.2–2.2)
Albumin: 4.2 g/dL (ref 3.8–4.8)
Alkaline Phosphatase: 98 IU/L (ref 44–121)
BUN/Creatinine Ratio: 18 (ref 12–28)
BUN: 11 mg/dL (ref 8–27)
Bilirubin Total: <0.2 mg/dL (ref 0.0–1.2)
CO2: 26 mmol/L (ref 20–29)
Calcium: 9.9 mg/dL (ref 8.7–10.3)
Chloride: 101 mmol/L (ref 96–106)
Creatinine, Ser: 0.62 mg/dL (ref 0.57–1.00)
GFR calc Af Amer: 112 mL/min/{1.73_m2} (ref >59)
GFR calc non Af Amer: 97 mL/min/{1.73_m2} (ref >59)
Globulin, Total: 2.7 g/dL (ref 1.5–4.5)
Glucose: 126 mg/dL — ABNORMAL HIGH (ref 65–99)
Potassium: 4.2 mmol/L (ref 3.5–5.2)
Sodium: 141 mmol/L (ref 134–144)
Total Protein: 6.9 g/dL (ref 6.0–8.5)

## 2020-03-17 LAB — LIPID PANEL
Chol/HDL Ratio: 2.3 ratio (ref 0.0–4.4)
Cholesterol, Total: 160 mg/dL (ref 100–199)
HDL: 70 mg/dL (ref >39)
LDL Chol Calc (NIH): 71 mg/dL (ref 0–99)
Triglycerides: 106 mg/dL (ref 0–149)
VLDL Cholesterol Cal: 19 mg/dL (ref 5–40)

## 2020-03-17 LAB — POCT GLYCOSYLATED HEMOGLOBIN (HGB A1C): Hemoglobin A1C: 7.2 % — AB (ref 4.0–5.6)

## 2020-03-17 LAB — GLUCOSE, POCT (MANUAL RESULT ENTRY): POC Glucose: 133 mg/dl — AB (ref 70–99)

## 2020-03-17 MED ORDER — AMLODIPINE BESYLATE-VALSARTAN 5-160 MG PO TABS: 1.0000 | ORAL_TABLET | Freq: Every day | ORAL | 3 refills | Status: DC

## 2020-03-17 MED ORDER — GABAPENTIN 800 MG PO TABS: 800.0000 mg | ORAL_TABLET | Freq: Three times a day (TID) | ORAL | 1 refills | Status: DC

## 2020-03-17 MED ORDER — ROSUVASTATIN CALCIUM 40 MG PO TABS: 40.0000 mg | ORAL_TABLET | Freq: Every day | ORAL | 3 refills | Status: DC

## 2020-03-17 MED ORDER — GLIMEPIRIDE 4 MG PO TABS: ORAL_TABLET | ORAL | 3 refills | Status: DC

## 2020-03-17 MED ORDER — FARXIGA 5 MG PO TABS: 5.0000 mg | ORAL_TABLET | Freq: Every morning | ORAL | 3 refills | Status: DC

## 2020-03-17 MED ORDER — SITAGLIPTIN PHOSPHATE 100 MG PO TABS: 100.0000 mg | ORAL_TABLET | Freq: Every day | ORAL | 3 refills | Status: DC

## 2020-03-17 NOTE — Assessment & Plan Note (Signed)
Well-controlled hypertension.  Continue present medication. Much improved diabetes with hemoglobin A1c 7.2.  Continue present medications.  No changes. Diet and nutrition discussed. Follow-up in 3 months.

## 2020-03-17 NOTE — Progress Notes (Signed)
Rachel Luna 65 y.o.   Chief Complaint  Patient presents with  . Diabetes    Follow up 3 month  . Medication Refill    Pend    HISTORY OF PRESENT ILLNESS: This is a 65 y.o. female with history of diabetes, hypertension, dyslipidemia here for follow-up and medication refill. Noncompliant with nutrition. Fully vaccinated against COVID.  No booster. Has no complaints or medical concerns. Lab Results  Component Value Date   HGBA1C 10.7 (A) 11/07/2019    HPI   Prior to Admission medications   Medication Sig Start Date End Date Taking? Authorizing Provider  carbamazepine (TEGRETOL) 200 MG tablet Take 0.5 tablets (100 mg total) by mouth daily. 02/14/20  Yes Suzzanne Cloud, NP  cetirizine (ZYRTEC) 10 MG tablet Take 10 mg by mouth daily.   Yes [provider]  clonazePAM (KLONOPIN) 0.5 MG tablet Take 1 tablet (0.5 mg total) by mouth at bedtime. 02/14/20  Yes Suzzanne Cloud, NP  DULoxetine (CYMBALTA) 30 MG capsule Take 1 capsule (30 mg total) by mouth daily. To be combined with 60 mg 11/21/19  Yes Eappen, Ria Clock, MD  DULoxetine (CYMBALTA) 60 MG capsule Take 1 capsule (60 mg total) by mouth daily. To be combined with 30 mg 11/21/19  Yes Eappen, Ria Clock, MD  estradiol (ESTRACE) 0.5 MG tablet Take 1 tablet (0.5 mg total) by mouth daily. 12/26/18  Yes Shelly Bombard, MD  FARXIGA 5 MG TABS tablet Take 5 mg by mouth every morning. 02/06/20  Yes [provider]  gabapentin (NEURONTIN) 800 MG tablet Take 1 tablet (800 mg total) by mouth 3 (three) times daily. 11/22/19  Yes Davelle Anselmi, Ines Bloomer, MD  glimepiride (AMARYL) 4 MG tablet TAKE ONE TABLET BY MOUTH EVERY MORNING WITH BREAKFAST 03/12/20  Yes Tarissa Kerin, Ines Bloomer, MD  JANUVIA 100 MG tablet TAKE ONE TABLET BY MOUTH ONCE DAILY 02/13/20  Yes Stepheny Canal, Ines Bloomer, MD  losartan-hydrochlorothiazide (HYZAAR) 100-25 MG tablet Take 1 tablet by mouth daily. 11/20/19  Yes [provider]  pramipexole (MIRAPEX) 0.75 MG tablet  One tablet three times a day 03/04/20  Yes Kathrynn Ducking, MD  ACCU-CHEK AVIVA PLUS test strip Use in the morning and after meals as needed. 07/13/18   Horald Pollen, MD  amLODipine-valsartan (EXFORGE) 5-160 MG tablet Take 1 tablet by mouth daily. 11/20/19 02/18/20  Horald Pollen, MD  baclofen (LIORESAL) 10 MG tablet Take 1 tablet (10 mg total) by mouth 3 (three) times daily. Patient not taking: Reported on 03/17/2020 02/14/20   Suzzanne Cloud, NP  meloxicam (MOBIC) 15 MG tablet Take 15 mg by mouth daily.  Patient not taking: Reported on 03/17/2020 07/04/15   [provider]  rosuvastatin (CRESTOR) 40 MG tablet Take 1 tablet (40 mg total) by mouth daily. 04/18/19 01/03/20  Horald Pollen, MD    Allergies  Allergen Reactions  . Belsomra [Suvorexant] Other (See Comments)    Vivid dreams  . Wellbutrin [Bupropion] Other (See Comments)    Irritability and Agitation  . Geodon [Ziprasidone Hcl] Other (See Comments)    Worsened Restless Leg Syndrome  . Lisinopril Cough  . Neupro [Rotigotine] Rash    Patient Active Problem List   Diagnosis Date Noted  . Benzodiazepine dependence in remission ([place]) 03/17/2020  . Morbid obesity ([place]) 03/17/2020  . Adrenal mass ([place]) 03/17/2020  . PAD (peripheral artery disease) ([place]) 02/18/2020  . Chronic venous insufficiency 02/18/2020  . Hyperlipidemia 02/18/2020  . Bipolar disorder, in full remission, most  recent episode mixed ([place]) 01/03/2020  . Attention deficit 01/03/2020  . Aortic atherosclerosis ([place]) 11/20/2019  . Hypokalemia 11/20/2019  . Restless leg syndrome 02/15/2019  . Bipolar I disorder, most recent episode mixed ([place]) 09/14/2018  . GAD (generalized anxiety disorder) 09/14/2018  . 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 Diabetes ([place]) 07/13/2018  . Seizure disorder ([place]) 10/19/2017  . 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 ANA positive 03/05/2015  . Elevated rheumatoid factor 03/05/2015  .  Hypertension associated with diabetes ([place]) 09/04/2014  . Avitaminosis D 09/04/2014  . Chronic low back pain 02/18/2014  . Menopausal hot flushes 08/14/2012  . Arthritis of knee, degenerative 04/06/2011  . Benign essential HTN 07/13/2010  . Essential (primary) hypertension 07/13/2010  . Insomnia, unspecified 07/13/2010  . Impaired fasting glucose 07/13/2010  . Chronic pain 01/08/2010  . Affective disorder, major 11/05/2009  . Major depressive disorder 11/05/2009  . Major depressive disorder, single episode, unspecified 11/05/2009  . Allergic rhinitis 07/03/2009    Past Medical History:  Diagnosis Date  . Anxiety   . Arthritis   . Bipolar disorder ([place])   . chronic low back pain   . Chronic low back pain 02/18/2014  . Depression   . Diabetes mellitus without complication ([place])   . Diabetes mellitus, type II ([place])   . Fibroids   . GERD (gastroesophageal reflux disease)   . Gout   . Hyperlipidemia   . Hypertension   . Obesity     Past Surgical History:  Procedure Laterality Date  . ABDOMINAL HYSTERECTOMY    . CHOLECYSTECTOMY    . FOOT SURGERY Left   . KNEE ARTHROPLASTY    . OVARIAN CYST REMOVAL    . SPINE SURGERY      Social History   Socioeconomic History  . Marital status: Divorced    Spouse name: Not on file  . Number of children: 0  . Years of education: 10  . Highest education level: 10th grade  Occupational History    Employer: UNEMPLOYED  Tobacco Use  . Smoking status: Passive Smoke Exposure - Never Smoker  . Smokeless tobacco: Never Used  . Tobacco comment: room mate smokes  Vaping Use  . Vaping Use: Never used  Substance and Sexual Activity  . Alcohol use: No    Alcohol/week: 0.0 standard drinks  . Drug use: No  . Sexual activity: Not Currently  Other Topics Concern  . Not on file  Social History Narrative   Patient is divorced and lives with a roommate.   Patient is on disability.   Patient has a 10th grade education.   Patient is  right-handed.   Patient drinks 6 sodas daily.         Social Determinants of Health   Financial Resource Strain: Not on file  Food Insecurity: Not on file  Transportation Needs: Not on file  Physical Activity: Not on file  Stress: Not on file  Social Connections: Not on file  Intimate Partner Violence: Not on file    Family History  Problem Relation Age of Onset  . Diabetes Mother   . Cancer Mother        breast  . Heart disease Mother   . Mental illness Mother        bipolar disorder  . Stroke Mother   . Breast cancer Mother   . Hypertension Mother   . High Cholesterol Mother   . Diabetes Father   . Hypertension Father   .  Diabetes Brother   . Bipolar disorder Brother   . Diabetes Brother   . Breast cancer Maternal Aunt   . Breast cancer Maternal Grandmother      Review of Systems  Constitutional: Negative.  Negative for chills and fever.  HENT: Negative.  Negative for congestion and sore throat.   Respiratory: Negative.  Negative for cough and shortness of breath.   Cardiovascular: Negative.  Negative for chest pain and palpitations.  Gastrointestinal: Negative.  Negative for abdominal pain, diarrhea, nausea and vomiting.  Genitourinary: Negative.  Negative for dysuria and hematuria.  Musculoskeletal: Negative.  Negative for myalgias and neck pain.  Skin: Negative.   Neurological: Negative.  Negative for dizziness and headaches.    Today's Vitals   03/17/20 1006  BP: 133/79  Pulse: 78  Resp: 16  Temp: 98 F (36.7 C)  TempSrc: Temporal  SpO2: 96%  Weight: 270 lb (122.5 kg)  Height: 5\' 4"  (1.626 m)   Body mass index is 46.35 kg/m. Wt Readings from Last 3 Encounters:  03/17/20 270 lb (122.5 kg)  02/18/20 277 lb 12.8 oz (126 kg)  01/23/20 280 lb 6.4 oz (127.2 kg)    Physical Exam Vitals reviewed.  Constitutional:      Appearance: Normal appearance. She is obese.  HENT:     Head: Normocephalic.  Eyes:     Extraocular Movements: Extraocular  movements intact.     Pupils: Pupils are equal, round, and reactive to light.  Cardiovascular:     Rate and Rhythm: Normal rate and regular rhythm.     Pulses: Normal pulses.     Heart sounds: Normal heart sounds.  Pulmonary:     Effort: Pulmonary effort is normal.     Breath sounds: Normal breath sounds.  Musculoskeletal:        General: Normal range of motion.     Cervical back: Normal range of motion.  Skin:    General: Skin is warm and dry.     Capillary Refill: Capillary refill takes less than 2 seconds.  Neurological:     General: No focal deficit present.     Mental Status: She is alert and oriented to person, place, and time.  Psychiatric:        Mood and Affect: Mood normal.        Behavior: Behavior normal.       Results for orders placed or performed in visit on 03/17/20 (from the past 24 hour(s))  POCT glucose (manual entry)     Status: Abnormal   Collection Time: 03/17/20 10:32 AM  Result Value Ref Range   POC Glucose 133 (A) 70 - 99 mg/dl  POCT glycosylated hemoglobin (Hb A1C)     Status: Abnormal   Collection Time: 03/17/20 10:35 AM  Result Value Ref Range   Hemoglobin A1C 7.2 (A) 4.0 - 5.6 %   HbA1c POC (<> result, manual entry)     HbA1c, POC (prediabetic range)     HbA1c, POC (controlled diabetic range)      ASSESSMENT & PLAN: Hypertension associated with diabetes ([place]) Well-controlled hypertension.  Continue present medication. Much improved diabetes with hemoglobin A1c 7.2.  Continue present medications.  No changes. Diet and nutrition discussed. Follow-up in 3 months.  Rachel Luna was seen today for diabetes and medication refill.  Diagnoses and all orders for this visit:  Hypertension associated with diabetes ([place]) -     POCT glucose (manual entry) -     POCT glycosylated hemoglobin (Hb A1C) -  Comprehensive metabolic panel -     amLODipine-valsartan (EXFORGE) 5-160 MG tablet; Take 1 tablet by mouth daily. -     FARXIGA 5 MG TABS tablet; Take  1 tablet (5 mg total) by mouth every morning.  Dyslipidemia associated with type 2 diabetes mellitus ([place]) -     Lipid panel  Benzodiazepine dependence in remission ([place])  Morbid obesity ([facility])  Adrenal mass ([facility])  Bipolar I disorder, most recent episode mixed ([place])  PAD (peripheral artery disease) ([facility])  Seizure disorder ([place])  Type 2 diabetes mellitus with hyperglycemia, without long-term current use of insulin ([facility]) -     glimepiride (AMARYL) 4 MG tablet; TAKE ONE TABLET BY MOUTH EVERY MORNING WITH BREAKFAST -     sitaGLIPtin (JANUVIA) 100 MG tablet; Take 1 tablet (100 mg total) by mouth daily. -     rosuvastatin (CRESTOR) 40 MG tablet; Take 1 tablet (40 mg total) by mouth daily.  Body mass index (BMI) of 45.0-49.9 in adult [facility])  Restless legs syndrome (RLS) -     gabapentin (NEURONTIN) 800 MG tablet; Take 1 tablet (800 mg total) by mouth 3 (three) times daily.    Patient Instructions       If you have lab work done today you will be contacted with your lab results within the next 2 weeks.  If you have not heard from [place] then please contact us. The fastest way to get your results is to register for My Chart.   IF you received an x-ray today, you will receive an invoice from [facility]. Please contact [facility] at [phone] with questions or concerns regarding your invoice.   IF you received labwork today, you will receive an invoice from [facility]. Please contact [facility] at [phone] with questions or concerns regarding your invoice.   Our billing staff will not be able to assist you with questions regarding bills from these companies.  You will be contacted with the lab results as soon as they are available. The fastest way to get your results is to activate your My Chart account. Instructions are located on the last page of this paperwork. If you have not heard from [place] regarding the results in 2 weeks, please contact this office.      Diabetes Mellitus and Nutrition, Adult When you have diabetes, or diabetes mellitus, it is very important to have healthy eating habits because your blood sugar (glucose) levels are greatly affected by what you eat and drink. Eating healthy foods in the right amounts, at about the same times every day, can help you:  Control your blood glucose.  Lower your risk of heart disease.  Improve your blood pressure.  Reach or maintain a healthy weight. What can affect my meal plan? Every person with diabetes is different, and each person has different needs for a meal plan. Your health care provider may recommend that you work with a dietitian to make a meal plan that is best for you. Your meal plan may vary depending on factors such as:  The calories you need.  The medicines you take.  Your weight.  Your blood glucose, blood pressure, and cholesterol levels.  Your activity level.  Other health conditions you have, such as heart or kidney disease. How do carbohydrates affect me? Carbohydrates, also called carbs, affect your blood glucose level more than any other type of food. Eating carbs naturally raises the amount of glucose in your blood. Carb counting is a method for keeping track of how many  carbs you eat. Counting carbs is important to keep your blood glucose at a healthy level, especially if you use insulin or take certain oral diabetes medicines. It is important to know how many carbs you can safely have in each meal. This is different for every person. Your dietitian can help you calculate how many carbs you should have at each meal and for each snack. How does alcohol affect me? Alcohol can cause a sudden decrease in blood glucose (hypoglycemia), especially if you use insulin or take certain oral diabetes medicines. Hypoglycemia can be a life-threatening condition. Symptoms of hypoglycemia, such as sleepiness, dizziness, and confusion, are similar to symptoms of having too much  alcohol.  Do not drink alcohol if: ? Your health care provider tells you not to drink. ? You are pregnant, may be pregnant, or are planning to become pregnant.  If you drink alcohol: ? Do not drink on an empty stomach. ? Limit how much you use to:  0-1 drink a day for women.  0-2 drinks a day for men. ? Be aware of how much alcohol is in your drink. In the [place] one drink equals one 12 oz bottle of beer (355 mL), one 5 oz glass of wine (148 mL), or one 1 oz glass of hard liquor (44 mL). ? Keep yourself hydrated with water, diet soda, or unsweetened iced tea.  Keep in mind that regular soda, juice, and other mixers may contain a lot of sugar and must be counted as carbs. What are tips for following this plan? Reading food labels  Start by checking the serving size on the "Nutrition Facts" label of packaged foods and drinks. The amount of calories, carbs, fats, and other nutrients listed on the label is based on one serving of the item. Many items contain more than one serving per package.  Check the total grams (g) of carbs in one serving. You can calculate the number of servings of carbs in one serving by dividing the total carbs by 15. For example, if a food has 30 g of total carbs per serving, it would be equal to 2 servings of carbs.  Check the number of grams (g) of saturated fats and trans fats in one serving. Choose foods that have a low amount or none of these fats.  Check the number of milligrams (mg) of salt (sodium) in one serving. Most people should limit total sodium intake to less than 2,300 mg per day.  Always check the nutrition information of foods labeled as "low-fat" or "nonfat." These foods may be higher in added sugar or refined carbs and should be avoided.  Talk to your dietitian to identify your daily goals for nutrients listed on the label. Shopping  Avoid buying canned, pre-made, or processed foods. These foods tend to be high in fat, sodium, and added  sugar.  Shop around the outside edge of the grocery store. This is where you will most often find fresh fruits and vegetables, bulk grains, fresh meats, and fresh dairy. Cooking  Use low-heat cooking methods, such as baking, instead of high-heat cooking methods like deep frying.  Cook using healthy oils, such as olive, canola, or sunflower oil.  Avoid cooking with butter, cream, or high-fat meats. Meal planning  Eat meals and snacks regularly, preferably at the same times every day. Avoid going long periods of time without eating.  Eat foods that are high in fiber, such as fresh fruits, vegetables, beans, and whole grains. Talk with your dietitian  about how many servings of carbs you can eat at each meal.  Eat 4-6 oz (112-168 g) of lean protein each day, such as lean meat, chicken, fish, eggs, or tofu. One ounce (oz) of lean protein is equal to: ? 1 oz (28 g) of meat, chicken, or fish. ? 1 egg. ?  cup (62 g) of tofu.  Eat some foods each day that contain healthy fats, such as avocado, nuts, seeds, and fish.   What foods should I eat? Fruits Berries. Apples. Oranges. Peaches. Apricots. Plums. Grapes. Mango. Papaya. Pomegranate. Kiwi. Cherries. Vegetables Lettuce. Spinach. Leafy greens, including kale, chard, collard greens, and mustard greens. Beets. Cauliflower. Cabbage. Broccoli. Carrots. Green beans. Tomatoes. Peppers. Onions. Cucumbers. Brussels sprouts. Grains Whole grains, such as whole-wheat or whole-grain bread, crackers, tortillas, cereal, and pasta. Unsweetened oatmeal. Quinoa. Brown or wild rice. Meats and other proteins Seafood. Poultry without skin. Lean cuts of poultry and beef. Tofu. Nuts. Seeds. Dairy Low-fat or fat-free dairy products such as milk, yogurt, and cheese. The items listed above may not be a complete list of foods and beverages you can eat. Contact a dietitian for more information. What foods should I avoid? Fruits Fruits canned with  syrup. Vegetables Canned vegetables. Frozen vegetables with butter or cream sauce. Grains Refined white flour and flour products such as bread, pasta, snack foods, and cereals. Avoid all processed foods. Meats and other proteins Fatty cuts of meat. Poultry with skin. Breaded or fried meats. Processed meat. Avoid saturated fats. Dairy Full-fat yogurt, cheese, or milk. Beverages Sweetened drinks, such as soda or iced tea. The items listed above may not be a complete list of foods and beverages you should avoid. Contact a dietitian for more information. Questions to ask a health care provider  Do I need to meet with a diabetes educator?  Do I need to meet with a dietitian?  What number can I call if I have questions?  When are the best times to check my blood glucose? Where to find more information:  American Diabetes Association: diabetes.org  Academy of Nutrition and Dietetics: www.eatright.[employer] of Diabetes and Digestive and Kidney Diseases: [employer]  Association of Diabetes Care and Education Specialists: www.diabeteseducator.org Summary  It is important to have healthy eating habits because your blood sugar (glucose) levels are greatly affected by what you eat and drink.  A healthy meal plan will help you control your blood glucose and maintain a healthy lifestyle.  Your health care provider may recommend that you work with a dietitian to make a meal plan that is best for you.  Keep in mind that carbohydrates (carbs) and alcohol have immediate effects on your blood glucose levels. It is important to count carbs and to use alcohol carefully. This information is not intended to replace advice given to you by your health care provider. Make sure you discuss any questions you have with your health care provider. Document Revised: 01/30/2019 Document Reviewed: 01/30/2019 Elsevier Patient Education  2021 Elsevier Inc.      Agustina Caroli,  MD [facility]

## 2020-03-17 NOTE — Patient Instructions (Addendum)
   If you have lab work done today you will be contacted with your lab results within the next 2 weeks.  If you have not heard from us then please contact us. The fastest way to get your results is to register for My Chart.   IF you received an x-ray today, you will receive an invoice from [facility]. Please contact [facility] at [phone] with questions or concerns regarding your invoice.   IF you received labwork today, you will receive an invoice from [facility]. Please contact [facility] at [phone] with questions or concerns regarding your invoice.   Our billing staff will not be able to assist you with questions regarding bills from these companies.  You will be contacted with the lab results as soon as they are available. The fastest way to get your results is to activate your My Chart account. Instructions are located on the last page of this paperwork. If you have not heard from us regarding the results in 2 weeks, please contact this office.     Diabetes Mellitus and Nutrition, Adult When you have diabetes, or diabetes mellitus, it is very important to have healthy eating habits because your blood sugar (glucose) levels are greatly affected by what you eat and drink. Eating healthy foods in the right amounts, at about the same times every day, can help you:  Control your blood glucose.  Lower your risk of heart disease.  Improve your blood pressure.  Reach or maintain a healthy weight. What can affect my meal plan? Every person with diabetes is different, and each person has different needs for a meal plan. Your health care provider may recommend that you work with a dietitian to make a meal plan that is best for you. Your meal plan may vary depending on factors such as:  The calories you need.  The medicines you take.  Your weight.  Your blood glucose, blood pressure, and cholesterol levels.  Your activity level.  Other health conditions you  have, such as heart or kidney disease. How do carbohydrates affect me? Carbohydrates, also called carbs, affect your blood glucose level more than any other type of food. Eating carbs naturally raises the amount of glucose in your blood. Carb counting is a method for keeping track of how many carbs you eat. Counting carbs is important to keep your blood glucose at a healthy level, especially if you use insulin or take certain oral diabetes medicines. It is important to know how many carbs you can safely have in each meal. This is different for every person. Your dietitian can help you calculate how many carbs you should have at each meal and for each snack. How does alcohol affect me? Alcohol can cause a sudden decrease in blood glucose (hypoglycemia), especially if you use insulin or take certain oral diabetes medicines. Hypoglycemia can be a life-threatening condition. Symptoms of hypoglycemia, such as sleepiness, dizziness, and confusion, are similar to symptoms of having too much alcohol.  Do not drink alcohol if: ? Your health care provider tells you not to drink. ? You are pregnant, may be pregnant, or are planning to become pregnant.  If you drink alcohol: ? Do not drink on an empty stomach. ? Limit how much you use to:  0-1 drink a day for women.  0-2 drinks a day for men. ? Be aware of how much alcohol is in your drink. In the [place] one drink equals one 12 oz bottle of beer (355 mL),   one 5 oz glass of wine (148 mL), or one 1 oz glass of hard liquor (44 mL). ? Keep yourself hydrated with water, diet soda, or unsweetened iced tea.  Keep in mind that regular soda, juice, and other mixers may contain a lot of sugar and must be counted as carbs. What are tips for following this plan? Reading food labels  Start by checking the serving size on the "Nutrition Facts" label of packaged foods and drinks. The amount of calories, carbs, fats, and other nutrients listed on the label is based on  one serving of the item. Many items contain more than one serving per package.  Check the total grams (g) of carbs in one serving. You can calculate the number of servings of carbs in one serving by dividing the total carbs by 15. For example, if a food has 30 g of total carbs per serving, it would be equal to 2 servings of carbs.  Check the number of grams (g) of saturated fats and trans fats in one serving. Choose foods that have a low amount or none of these fats.  Check the number of milligrams (mg) of salt (sodium) in one serving. Most people should limit total sodium intake to less than 2,300 mg per day.  Always check the nutrition information of foods labeled as "low-fat" or "nonfat." These foods may be higher in added sugar or refined carbs and should be avoided.  Talk to your dietitian to identify your daily goals for nutrients listed on the label. Shopping  Avoid buying canned, pre-made, or processed foods. These foods tend to be high in fat, sodium, and added sugar.  Shop around the outside edge of the grocery store. This is where you will most often find fresh fruits and vegetables, bulk grains, fresh meats, and fresh dairy. Cooking  Use low-heat cooking methods, such as baking, instead of high-heat cooking methods like deep frying.  Cook using healthy oils, such as olive, canola, or sunflower oil.  Avoid cooking with butter, cream, or high-fat meats. Meal planning  Eat meals and snacks regularly, preferably at the same times every day. Avoid going long periods of time without eating.  Eat foods that are high in fiber, such as fresh fruits, vegetables, beans, and whole grains. Talk with your dietitian about how many servings of carbs you can eat at each meal.  Eat 4-6 oz (112-168 g) of lean protein each day, such as lean meat, chicken, fish, eggs, or tofu. One ounce (oz) of lean protein is equal to: ? 1 oz (28 g) of meat, chicken, or fish. ? 1 egg. ?  cup (62 g) of  tofu.  Eat some foods each day that contain healthy fats, such as avocado, nuts, seeds, and fish.   What foods should I eat? Fruits Berries. Apples. Oranges. Peaches. Apricots. Plums. Grapes. Mango. Papaya. Pomegranate. Kiwi. Cherries. Vegetables Lettuce. Spinach. Leafy greens, including kale, chard, collard greens, and mustard greens. Beets. Cauliflower. Cabbage. Broccoli. Carrots. Green beans. Tomatoes. Peppers. Onions. Cucumbers. Brussels sprouts. Grains Whole grains, such as whole-wheat or whole-grain bread, crackers, tortillas, cereal, and pasta. Unsweetened oatmeal. Quinoa. Brown or wild rice. Meats and other proteins Seafood. Poultry without skin. Lean cuts of poultry and beef. Tofu. Nuts. Seeds. Dairy Low-fat or fat-free dairy products such as milk, yogurt, and cheese. The items listed above may not be a complete list of foods and beverages you can eat. Contact a dietitian for more information. What foods should I avoid? Fruits Fruits canned   with syrup. Vegetables Canned vegetables. Frozen vegetables with butter or cream sauce. Grains Refined white flour and flour products such as bread, pasta, snack foods, and cereals. Avoid all processed foods. Meats and other proteins Fatty cuts of meat. Poultry with skin. Breaded or fried meats. Processed meat. Avoid saturated fats. Dairy Full-fat yogurt, cheese, or milk. Beverages Sweetened drinks, such as soda or iced tea. The items listed above may not be a complete list of foods and beverages you should avoid. Contact a dietitian for more information. Questions to ask a health care provider  Do I need to meet with a diabetes educator?  Do I need to meet with a dietitian?  What number can I call if I have questions?  When are the best times to check my blood glucose? Where to find more information:  American Diabetes Association: diabetes.org  Academy of Nutrition and Dietetics: www.eatright.org  National Institute of  Diabetes and Digestive and Kidney Diseases: www.niddk.nih.gov  Association of Diabetes Care and Education Specialists: www.diabeteseducator.org Summary  It is important to have healthy eating habits because your blood sugar (glucose) levels are greatly affected by what you eat and drink.  A healthy meal plan will help you control your blood glucose and maintain a healthy lifestyle.  Your health care provider may recommend that you work with a dietitian to make a meal plan that is best for you.  Keep in mind that carbohydrates (carbs) and alcohol have immediate effects on your blood glucose levels. It is important to count carbs and to use alcohol carefully. This information is not intended to replace advice given to you by your health care provider. Make sure you discuss any questions you have with your health care provider. Document Revised: 01/30/2019 Document Reviewed: 01/30/2019 Elsevier Patient Education  2021 Elsevier Inc.  

## 2020-04-17 ENCOUNTER — Ambulatory Visit: Payer: Medicare Other | Admitting: Neurology

## 2020-04-21 ENCOUNTER — Ambulatory Visit (INDEPENDENT_AMBULATORY_CARE_PROVIDER_SITE_OTHER): Payer: Medicare Other | Admitting: Neurology

## 2020-04-21 ENCOUNTER — Encounter: Payer: Self-pay | Admitting: Neurology

## 2020-04-21 VITALS — BP 140/75 | HR 72 | Ht 64.0 in | Wt 269.6 lb

## 2020-04-21 DIAGNOSIS — G40909 Epilepsy, unspecified, not intractable, without status epilepticus: Secondary | ICD-10-CM | POA: Diagnosis not present

## 2020-04-21 DIAGNOSIS — Z5181 Encounter for therapeutic drug level monitoring: Secondary | ICD-10-CM | POA: Diagnosis not present

## 2020-04-21 DIAGNOSIS — M545 Low back pain, unspecified: Secondary | ICD-10-CM

## 2020-04-21 DIAGNOSIS — G2581 Restless legs syndrome: Secondary | ICD-10-CM | POA: Diagnosis not present

## 2020-04-21 DIAGNOSIS — Z79899 Other long term (current) drug therapy: Secondary | ICD-10-CM | POA: Diagnosis not present

## 2020-04-21 DIAGNOSIS — G8929 Other chronic pain: Secondary | ICD-10-CM | POA: Diagnosis not present

## 2020-04-21 MED ORDER — BACLOFEN 10 MG PO TABS
ORAL_TABLET | ORAL | 1 refills | Status: DC
Start: 2020-04-21 — End: 2020-10-15

## 2020-04-21 NOTE — Progress Notes (Signed)
Reason for visit: Restless leg syndrome, chronic low back pain, bipolar disorder, history of seizures  Rachel Luna is an 65 y.o. female  History of present illness:  Rachel Luna is a 65 year old right-handed white female with a history of restless leg syndrome that is quite severe.  The patient has difficulty throughout the day but worse in the evenings.  She finds that the Mirapex does help some, but even with this medication she still has difficulty every night.  She will tend to wake up as she is falling to sleep and have to get up and walk.  She reports that the right leg is more involved than the left.  She finds the baclofen does help some as well, she is also on clonazepam 0.5 mg at night.  She takes low-dose carbamazepine, 100 mg daily, she has not had a seizure in over 20 years.  The patient recently has been under some stress as her father died yesterday.  Past Medical History:  Diagnosis Date   Anxiety    Arthritis    Bipolar disorder ([place])    chronic low back pain    Chronic low back pain 02/18/2014   Depression    Diabetes mellitus without complication ([place])    Diabetes mellitus, type II ([place])    Fibroids    GERD (gastroesophageal reflux disease)    Gout    Hyperlipidemia    Hypertension    Obesity     Past Surgical History:  Procedure Laterality Date   ABDOMINAL HYSTERECTOMY     CHOLECYSTECTOMY     FOOT SURGERY Left    KNEE ARTHROPLASTY     OVARIAN CYST REMOVAL     SPINE SURGERY      Family History  Problem Relation Age of Onset   Diabetes Mother    Cancer Mother        breast   Heart disease Mother    Mental illness Mother        bipolar disorder   Stroke Mother    Breast cancer Mother    Hypertension Mother    High Cholesterol Mother    Diabetes Father    Hypertension Father    Diabetes Brother    Bipolar disorder Brother    Diabetes Brother    Breast cancer Maternal Aunt    Breast cancer Maternal  Grandmother     Social history:  reports that she is a non-smoker but has been exposed to tobacco smoke. She has never used smokeless tobacco. She reports that she does not drink alcohol and does not use drugs.    Allergies  Allergen Reactions   Belsomra [Suvorexant] Other (See Comments)    Vivid dreams   Wellbutrin [Bupropion] Other (See Comments)    Irritability and Agitation   Geodon [Ziprasidone Hcl] Other (See Comments)    Worsened Restless Leg Syndrome   Lisinopril Cough   Neupro [Rotigotine] Rash    Medications:  Prior to Admission medications   Medication Sig Start Date End Date Taking? Authorizing Provider  ACCU-CHEK AVIVA PLUS test strip Use in the morning and after meals as needed. 07/13/18  Yes Sagardia, Ines Bloomer, MD  amLODipine-valsartan (EXFORGE) 5-160 MG tablet Take 1 tablet by mouth daily. 03/17/20 06/15/20 Yes Sagardia, Ines Bloomer, MD  baclofen (LIORESAL) 10 MG tablet Take 1 tablet (10 mg total) by mouth 3 (three) times daily. 02/14/20  Yes Suzzanne Cloud, NP  carbamazepine (TEGRETOL) 200 MG tablet Take 0.5 tablets (100 mg total) by  mouth daily. 02/14/20  Yes Suzzanne Cloud, NP  cetirizine (ZYRTEC) 10 MG tablet Take 10 mg by mouth daily.   Yes [provider]  clonazePAM (KLONOPIN) 0.5 MG tablet Take 1 tablet (0.5 mg total) by mouth at bedtime. 02/14/20  Yes Suzzanne Cloud, NP  DULoxetine (CYMBALTA) 30 MG capsule Take 1 capsule (30 mg total) by mouth daily. To be combined with 60 mg 11/21/19  Yes Eappen, Ria Clock, MD  DULoxetine (CYMBALTA) 60 MG capsule Take 1 capsule (60 mg total) by mouth daily. To be combined with 30 mg 11/21/19  Yes Eappen, Ria Clock, MD  estradiol (ESTRACE) 0.5 MG tablet Take 1 tablet (0.5 mg total) by mouth daily. 12/26/18  Yes Shelly Bombard, MD  FARXIGA 5 MG TABS tablet Take 1 tablet (5 mg total) by mouth every morning. 03/17/20  Yes Sagardia, Ines Bloomer, MD  gabapentin (NEURONTIN) 800 MG tablet Take 1 tablet (800 mg total) by mouth 3  (three) times daily. 03/17/20  Yes Sagardia, Ines Bloomer, MD  glimepiride (AMARYL) 4 MG tablet TAKE ONE TABLET BY MOUTH EVERY MORNING WITH BREAKFAST 03/17/20  Yes Sagardia, Ines Bloomer, MD  meloxicam (MOBIC) 15 MG tablet Take 15 mg by mouth daily. 07/04/15  Yes [provider]  pramipexole (MIRAPEX) 0.75 MG tablet One tablet three times a day 03/04/20  Yes Kathrynn Ducking, MD  rosuvastatin (CRESTOR) 40 MG tablet Take 1 tablet (40 mg total) by mouth daily. 03/17/20 06/15/20 Yes Sagardia, Ines Bloomer, MD  sitaGLIPtin (JANUVIA) 100 MG tablet Take 1 tablet (100 mg total) by mouth daily. 03/17/20  Yes Sagardia, Ines Bloomer, MD    ROS:  Out of a complete 14 system review of symptoms, the patient complains only of the following symptoms, and all other reviewed systems are negative.  Restless legs Low back pain  Blood pressure 140/75, pulse 72, height 5\' 4"  (1.626 m), weight 269 lb 9.6 oz (122.3 kg).  Physical Exam  General: The patient is alert and cooperative at the time of the examination.  The patient is markedly obese.  Skin: No significant peripheral edema is noted.   Neurologic Exam  Mental status: The patient is alert and oriented x 3 at the time of the examination. The patient has apparent normal recent and remote memory, with an apparently normal attention span and concentration ability.   Cranial nerves: Facial symmetry is present. Speech is normal, no aphasia or dysarthria is noted. Extraocular movements are full. Visual fields are full.  Motor: The patient has good strength in all 4 extremities.  Sensory examination: Soft touch sensation is symmetric on the face, arms, and legs.  Coordination: The patient has good finger-nose-finger and heel-to-shin bilaterally.  Gait and station: The patient has a slightly wide-based gait, tandem gait is unsteady.  Romberg is negative.  Reflexes: Deep tendon reflexes are symmetric.   Assessment/Plan:  1.  Restless leg  syndrome  2.  History of seizures, well controlled  3.  Obesity  4.  Chronic low back pain  The patient will go up on the baclofen taking 10 mg twice during the day and 20 mg in the evening.  She will continue her Mirapex, she is on the 0.75 mg tablets, she apparently takes all 3 tablets in the evening.  She will continue her clonazepam.  We will check blood work today.  She will follow-up in 6 months.   Jill Alexanders MD 04/21/2020 9:03 AM  [facility] [address] [place], [place]  [address]  Phone [phone] Fax [phone]

## 2020-04-22 LAB — CARBAMAZEPINE LEVEL, TOTAL: Carbamazepine (Tegretol), S: 3.4 ug/mL — ABNORMAL LOW (ref 4.0–12.0)

## 2020-04-22 LAB — COMPREHENSIVE METABOLIC PANEL
ALT: 17 IU/L (ref 0–32)
AST: 18 IU/L (ref 0–40)
Albumin/Globulin Ratio: 1.6 (ref 1.2–2.2)
Albumin: 4.1 g/dL (ref 3.8–4.8)
Alkaline Phosphatase: 104 IU/L (ref 44–121)
BUN/Creatinine Ratio: 27 (ref 12–28)
BUN: 13 mg/dL (ref 8–27)
Bilirubin Total: <0.2 mg/dL (ref 0.0–1.2)
CO2: 25 mmol/L (ref 20–29)
Calcium: 9.9 mg/dL (ref 8.7–10.3)
Chloride: 100 mmol/L (ref 96–106)
Creatinine, Ser: 0.48 mg/dL — ABNORMAL LOW (ref 0.57–1.00)
GFR calc Af Amer: 122 mL/min/{1.73_m2} (ref >59)
GFR calc non Af Amer: 105 mL/min/{1.73_m2} (ref >59)
Globulin, Total: 2.5 g/dL (ref 1.5–4.5)
Glucose: 89 mg/dL (ref 65–99)
Potassium: 4.4 mmol/L (ref 3.5–5.2)
Sodium: 142 mmol/L (ref 134–144)
Total Protein: 6.6 g/dL (ref 6.0–8.5)

## 2020-04-22 LAB — CBC WITH DIFFERENTIAL/PLATELET
Basophils Absolute: 0 10*3/uL (ref 0.0–0.2)
Basos: 1 %
EOS (ABSOLUTE): 0.2 10*3/uL (ref 0.0–0.4)
Eos: 3 %
Hematocrit: 44.6 % (ref 34.0–46.6)
Hemoglobin: 14.3 g/dL (ref 11.1–15.9)
Immature Grans (Abs): 0 10*3/uL (ref 0.0–0.1)
Immature Granulocytes: 0 %
Lymphocytes Absolute: 2.3 10*3/uL (ref 0.7–3.1)
Lymphs: 30 %
MCH: 27.5 pg (ref 26.6–33.0)
MCHC: 32.1 g/dL (ref 31.5–35.7)
MCV: 86 fL (ref 79–97)
Monocytes Absolute: 0.6 10*3/uL (ref 0.1–0.9)
Monocytes: 8 %
Neutrophils Absolute: 4.6 10*3/uL (ref 1.4–7.0)
Neutrophils: 58 %
Platelets: 247 10*3/uL (ref 150–450)
RBC: 5.2 x10E6/uL (ref 3.77–5.28)
RDW: 15.3 % (ref 11.7–15.4)
WBC: 7.8 10*3/uL (ref 3.4–10.8)

## 2020-04-22 LAB — IRON AND TIBC
Iron Saturation: 24 % (ref 15–55)
Iron: 90 ug/dL (ref 27–139)
Total Iron Binding Capacity: 377 ug/dL (ref 250–450)
UIBC: 287 ug/dL (ref 118–369)

## 2020-04-22 LAB — FERRITIN: Ferritin: 18 ng/mL (ref 15–150)

## 2020-04-23 ENCOUNTER — Telehealth: Payer: Self-pay | Admitting: Emergency Medicine

## 2020-04-23 NOTE — Telephone Encounter (Signed)
Called patient and discussed Dr. Tobey Grim findings and recommendations regarding blood work.  Patient denied further questions, verbalized understanding and expressed appreciation for the phone call.

## 2020-04-23 NOTE — Telephone Encounter (Signed)
-----   Message from Kathrynn Ducking, MD sent at 04/22/2020 12:48 PM EST ----- Iron and ferritin levels are normal. Low carbamazepine level, stable, no change in medication dose recommended. Chemistry panel and CBC is normal.  Please call the patient. ----- Message ----- From: Interface, [facility] Lab Results In Sent: 04/22/2020   7:37 AM EST To: Kathrynn Ducking, MD

## 2020-06-02 ENCOUNTER — Other Ambulatory Visit: Payer: Self-pay | Admitting: Neurology

## 2020-06-02 ENCOUNTER — Telehealth: Payer: Self-pay | Admitting: Neurology

## 2020-06-02 MED ORDER — PRAMIPEXOLE DIHYDROCHLORIDE 0.75 MG PO TABS: ORAL_TABLET | ORAL | 1 refills | Status: DC

## 2020-06-02 MED ORDER — CLONAZEPAM 0.5 MG PO TABS: 0.5000 mg | ORAL_TABLET | Freq: Every day | ORAL | 3 refills | Status: DC

## 2020-06-02 NOTE — Addendum Note (Signed)
Addended by: Wyvonnia Lora on: 06/02/2020 02:15 PM   Modules accepted: Orders

## 2020-06-02 NOTE — Telephone Encounter (Signed)
I called the patient.  The patient has gone up on the Mirapex taking 1 in the morning and 3 at night, I will call in a prescription to accommodate this.

## 2020-06-02 NOTE — Addendum Note (Signed)
Addended by: Kathrynn Ducking on: 06/02/2020 04:37 PM   Modules accepted: Orders

## 2020-06-02 NOTE — Telephone Encounter (Signed)
[email or web address]  [place] has called for a refill for clonazePAM (KLONOPIN) 0.5 MG tablet to [facility]

## 2020-06-02 NOTE — Telephone Encounter (Signed)
Pt has called to report that Dr Jannifer Franklin was supposed to have increased her pramipexole (MIRAPEX) 0.75 MG tablet and she is now running into an issue with the pharmacy.  She Is unable to pick up a refill, please call.

## 2020-06-02 NOTE — Telephone Encounter (Signed)
I called the patient, left a message, the patient indicates that she is taking 3 of the 0.75 mg Mirapex tablets at night which is already supramaximal one-time dosing.  I will call her back to find out what is going on with the prescription.

## 2020-06-02 NOTE — Telephone Encounter (Signed)
[place] requested refill DULoxetine (CYMBALTA) 60 MG capsule and DULoxetine (CYMBALTA) 30 MG capsule at [facility].

## 2020-06-02 NOTE — Telephone Encounter (Signed)
[place] back and advised we are not the prescribing office for this. They confirmed this and advised they call us in error. They will call prescribing MD. Nothing further needed.

## 2020-06-06 ENCOUNTER — Other Ambulatory Visit: Payer: Self-pay | Admitting: Emergency Medicine

## 2020-06-06 DIAGNOSIS — F3178 Bipolar disorder, in full remission, most recent episode mixed: Secondary | ICD-10-CM

## 2020-06-06 DIAGNOSIS — F411 Generalized anxiety disorder: Secondary | ICD-10-CM

## 2020-06-14 ENCOUNTER — Encounter: Payer: Self-pay | Admitting: Emergency Medicine

## 2020-06-14 NOTE — Progress Notes (Signed)
Reason for visit: Restless leg syndrome, chronic low back pain, bipolar disorder, history of seizures  Rachel Luna is an 65 y.o. female  History of present illness:  Rachel Luna is a 65 year old right-handed white female with a history of restless leg syndrome that is quite severe.  The patient has difficulty throughout the day but worse in the evenings.  She finds that the Mirapex does help some, but even with this medication she still has difficulty every night.  She will tend to wake up as she is falling to sleep and have to get up and walk.  She reports that the right leg is more involved than the left.  She finds the baclofen does help some as well, she is also on clonazepam 0.5 mg at night.  She takes low-dose carbamazepine, 100 mg daily, she has not had a seizure in over 20 years.  The patient recently has been under some stress as her father died yesterday.  Assessment/Plan:  1.  Restless leg syndrome  2.  History of seizures, well controlled  3.  Obesity  4.  Chronic low back pain  The patient will go up on the baclofen taking 10 mg twice during the day and 20 mg in the evening.  She will continue her Mirapex, she is on the 0.75 mg tablets, she apparently takes all 3 tablets in the evening.  She will continue her clonazepam.  We will check blood work today.  She will follow-up in 6 months.   Jill Alexanders MD 04/21/2020 9:03 AM  [facility] [address] [place], [place] [address]   Lab Results  Component Value Date   HGBA1C 7.2 (A) 03/17/2020   BP Readings from Last 3 Encounters:  04/21/20 140/75  03/17/20 133/79  02/18/20 (!) 155/77   Lab Results  Component Value Date   CHOL 160 03/17/2020   HDL 70 03/17/2020   LDLCALC 71 03/17/2020   TRIG 106 03/17/2020   CHOLHDL 2.3 03/17/2020   Lab Results  Component Value Date   CREATININE 0.48 (L) 04/21/2020   BUN 13 04/21/2020   NA 142 04/21/2020   K 4.4 04/21/2020   CL  100 04/21/2020   CO2 25 04/21/2020   ASSESSMENT & PLAN: Hypertension associated with diabetes ([place]) Well-controlled hypertension.  Continue present medication. Much improved diabetes with hemoglobin A1c 7.2.  Continue present medications.  No changes. Diet and nutrition discussed. Follow-up in 3 months.

## 2020-06-16 ENCOUNTER — Ambulatory Visit: Payer: Medicare Other | Admitting: Emergency Medicine

## 2020-06-16 DIAGNOSIS — I872 Venous insufficiency (chronic) (peripheral): Secondary | ICD-10-CM

## 2020-06-16 DIAGNOSIS — G2581 Restless legs syndrome: Secondary | ICD-10-CM

## 2020-06-16 DIAGNOSIS — I700 Atherosclerosis of aorta: Secondary | ICD-10-CM

## 2020-06-16 DIAGNOSIS — F411 Generalized anxiety disorder: Secondary | ICD-10-CM

## 2020-06-16 DIAGNOSIS — I152 Hypertension secondary to endocrine disorders: Secondary | ICD-10-CM

## 2020-06-16 DIAGNOSIS — G8929 Other chronic pain: Secondary | ICD-10-CM

## 2020-06-16 DIAGNOSIS — I739 Peripheral vascular disease, unspecified: Secondary | ICD-10-CM

## 2020-06-16 DIAGNOSIS — G40909 Epilepsy, unspecified, not intractable, without status epilepticus: Secondary | ICD-10-CM

## 2020-06-16 DIAGNOSIS — F3178 Bipolar disorder, in full remission, most recent episode mixed: Secondary | ICD-10-CM

## 2020-07-09 ENCOUNTER — Ambulatory Visit: Payer: Medicare Other | Admitting: Emergency Medicine

## 2020-07-16 ENCOUNTER — Ambulatory Visit (INDEPENDENT_AMBULATORY_CARE_PROVIDER_SITE_OTHER): Payer: Medicare Other | Admitting: Emergency Medicine

## 2020-07-16 ENCOUNTER — Other Ambulatory Visit: Payer: Self-pay

## 2020-07-16 ENCOUNTER — Encounter: Payer: Self-pay | Admitting: Emergency Medicine

## 2020-07-16 VITALS — BP 138/78 | HR 68 | Temp 98.7°F | Ht 64.0 in | Wt 259.4 lb

## 2020-07-16 DIAGNOSIS — F3178 Bipolar disorder, in full remission, most recent episode mixed: Secondary | ICD-10-CM | POA: Diagnosis not present

## 2020-07-16 DIAGNOSIS — E1165 Type 2 diabetes mellitus with hyperglycemia: Secondary | ICD-10-CM

## 2020-07-16 DIAGNOSIS — E1159 Type 2 diabetes mellitus with other circulatory complications: Secondary | ICD-10-CM

## 2020-07-16 DIAGNOSIS — I700 Atherosclerosis of aorta: Secondary | ICD-10-CM | POA: Diagnosis not present

## 2020-07-16 DIAGNOSIS — I739 Peripheral vascular disease, unspecified: Secondary | ICD-10-CM | POA: Diagnosis not present

## 2020-07-16 DIAGNOSIS — F411 Generalized anxiety disorder: Secondary | ICD-10-CM

## 2020-07-16 DIAGNOSIS — I152 Hypertension secondary to endocrine disorders: Secondary | ICD-10-CM | POA: Diagnosis not present

## 2020-07-16 LAB — POCT GLYCOSYLATED HEMOGLOBIN (HGB A1C): Hemoglobin A1C: 6.1 % — AB (ref 4.0–5.6)

## 2020-07-16 MED ORDER — ROSUVASTATIN CALCIUM 40 MG PO TABS: 40.0000 mg | ORAL_TABLET | Freq: Every day | ORAL | 3 refills | Status: DC

## 2020-07-16 NOTE — Patient Instructions (Signed)
Diabetes Mellitus and Nutrition, Adult When you have diabetes, or diabetes mellitus, it is very important to have healthy eating habits because your blood sugar (glucose) levels are greatly affected by what you eat and drink. Eating healthy foods in the right amounts, at about the same times every day, can help you:  Control your blood glucose.  Lower your risk of heart disease.  Improve your blood pressure.  Reach or maintain a healthy weight. What can affect my meal plan? Every person with diabetes is different, and each person has different needs for a meal plan. Your health care provider may recommend that you work with a dietitian to make a meal plan that is best for you. Your meal plan may vary depending on factors such as:  The calories you need.  The medicines you take.  Your weight.  Your blood glucose, blood pressure, and cholesterol levels.  Your activity level.  Other health conditions you have, such as heart or kidney disease. How do carbohydrates affect me? Carbohydrates, also called carbs, affect your blood glucose level more than any other type of food. Eating carbs naturally raises the amount of glucose in your blood. Carb counting is a method for keeping track of how many carbs you eat. Counting carbs is important to keep your blood glucose at a healthy level, especially if you use insulin or take certain oral diabetes medicines. It is important to know how many carbs you can safely have in each meal. This is different for every person. Your dietitian can help you calculate how many carbs you should have at each meal and for each snack. How does alcohol affect me? Alcohol can cause a sudden decrease in blood glucose (hypoglycemia), especially if you use insulin or take certain oral diabetes medicines. Hypoglycemia can be a life-threatening condition. Symptoms of hypoglycemia, such as sleepiness, dizziness, and confusion, are similar to symptoms of having too much  alcohol.  Do not drink alcohol if: ? Your health care provider tells you not to drink. ? You are pregnant, may be pregnant, or are planning to become pregnant.  If you drink alcohol: ? Do not drink on an empty stomach. ? Limit how much you use to:  0-1 drink a day for women.  0-2 drinks a day for men. ? Be aware of how much alcohol is in your drink. In the [place] one drink equals one 12 oz bottle of beer (355 mL), one 5 oz glass of wine (148 mL), or one 1 oz glass of hard liquor (44 mL). ? Keep yourself hydrated with water, diet soda, or unsweetened iced tea.  Keep in mind that regular soda, juice, and other mixers may contain a lot of sugar and must be counted as carbs. What are tips for following this plan? Reading food labels  Start by checking the serving size on the "Nutrition Facts" label of packaged foods and drinks. The amount of calories, carbs, fats, and other nutrients listed on the label is based on one serving of the item. Many items contain more than one serving per package.  Check the total grams (g) of carbs in one serving. You can calculate the number of servings of carbs in one serving by dividing the total carbs by 15. For example, if a food has 30 g of total carbs per serving, it would be equal to 2 servings of carbs.  Check the number of grams (g) of saturated fats and trans fats in one serving. Choose foods that have   a low amount or none of these fats.  Check the number of milligrams (mg) of salt (sodium) in one serving. Most people should limit total sodium intake to less than 2,300 mg per day.  Always check the nutrition information of foods labeled as "low-fat" or "nonfat." These foods may be higher in added sugar or refined carbs and should be avoided.  Talk to your dietitian to identify your daily goals for nutrients listed on the label. Shopping  Avoid buying canned, pre-made, or processed foods. These foods tend to be high in fat, sodium, and added  sugar.  Shop around the outside edge of the grocery store. This is where you will most often find fresh fruits and vegetables, bulk grains, fresh meats, and fresh dairy. Cooking  Use low-heat cooking methods, such as baking, instead of high-heat cooking methods like deep frying.  Cook using healthy oils, such as olive, canola, or sunflower oil.  Avoid cooking with butter, cream, or high-fat meats. Meal planning  Eat meals and snacks regularly, preferably at the same times every day. Avoid going long periods of time without eating.  Eat foods that are high in fiber, such as fresh fruits, vegetables, beans, and whole grains. Talk with your dietitian about how many servings of carbs you can eat at each meal.  Eat 4-6 oz (112-168 g) of lean protein each day, such as lean meat, chicken, fish, eggs, or tofu. One ounce (oz) of lean protein is equal to: ? 1 oz (28 g) of meat, chicken, or fish. ? 1 egg. ?  cup (62 g) of tofu.  Eat some foods each day that contain healthy fats, such as avocado, nuts, seeds, and fish.   What foods should I eat? Fruits Berries. Apples. Oranges. Peaches. Apricots. Plums. Grapes. Mango. Papaya. Pomegranate. Kiwi. Cherries. Vegetables Lettuce. Spinach. Leafy greens, including kale, chard, collard greens, and mustard greens. Beets. Cauliflower. Cabbage. Broccoli. Carrots. Green beans. Tomatoes. Peppers. Onions. Cucumbers. Brussels sprouts. Grains Whole grains, such as whole-wheat or whole-grain bread, crackers, tortillas, cereal, and pasta. Unsweetened oatmeal. Quinoa. Brown or wild rice. Meats and other proteins Seafood. Poultry without skin. Lean cuts of poultry and beef. Tofu. Nuts. Seeds. Dairy Low-fat or fat-free dairy products such as milk, yogurt, and cheese. The items listed above may not be a complete list of foods and beverages you can eat. Contact a dietitian for more information. What foods should I avoid? Fruits Fruits canned with  syrup. Vegetables Canned vegetables. Frozen vegetables with butter or cream sauce. Grains Refined white flour and flour products such as bread, pasta, snack foods, and cereals. Avoid all processed foods. Meats and other proteins Fatty cuts of meat. Poultry with skin. Breaded or fried meats. Processed meat. Avoid saturated fats. Dairy Full-fat yogurt, cheese, or milk. Beverages Sweetened drinks, such as soda or iced tea. The items listed above may not be a complete list of foods and beverages you should avoid. Contact a dietitian for more information. Questions to ask a health care provider  Do I need to meet with a diabetes educator?  Do I need to meet with a dietitian?  What number can I call if I have questions?  When are the best times to check my blood glucose? Where to find more information:  American Diabetes Association: diabetes.org  Academy of Nutrition and Dietetics: www.eatright.org  National Institute of Diabetes and Digestive and Kidney Diseases: www.niddk.nih.gov  Association of Diabetes Care and Education Specialists: www.diabeteseducator.org Summary  It is important to have healthy eating   habits because your blood sugar (glucose) levels are greatly affected by what you eat and drink.  A healthy meal plan will help you control your blood glucose and maintain a healthy lifestyle.  Your health care provider may recommend that you work with a dietitian to make a meal plan that is best for you.  Keep in mind that carbohydrates (carbs) and alcohol have immediate effects on your blood glucose levels. It is important to count carbs and to use alcohol carefully. This information is not intended to replace advice given to you by your health care provider. Make sure you discuss any questions you have with your health care provider. Document Revised: 01/30/2019 Document Reviewed: 01/30/2019 [employer] Patient Education  2021 [employer]  

## 2020-07-16 NOTE — Assessment & Plan Note (Signed)
Well-controlled.  Continue clonazepam as needed Continue Cymbalta 30 mg daily.

## 2020-07-16 NOTE — Assessment & Plan Note (Signed)
In full remission.  Continue present medications and psychiatric follow-ups.

## 2020-07-16 NOTE — Progress Notes (Signed)
Lab Results  Component Value Date   HGBA1C 7.2 (A) 03/17/2020   BP Readings from Last 3 Encounters:  04/21/20 140/75  03/17/20 133/79  02/18/20 (!) 155/77   Lab Results  Component Value Date   CREATININE 0.48 (L) 04/21/2020   BUN 13 04/21/2020   NA 142 04/21/2020   K 4.4 04/21/2020   CL 100 04/21/2020   CO2 25 04/21/2020   Lab Results  Component Value Date   CHOL 160 03/17/2020   HDL 70 03/17/2020   LDLCALC 71 03/17/2020   TRIG 106 03/17/2020   CHOLHDL 2.3 03/17/2020   Rachel Luna 65 y.o.   Chief complaint: Diabetes follow-up.  HISTORY OF PRESENT ILLNESS: This is a 65 y.o. female with history of diabetes and hypertension here for follow-up. #1 diabetes: Taking Farxiga 5 mg daily, glimepiride 4 mg daily, Januvia 100 mg daily.  Intolerant to metformin. #2 hypertension: Taking Exforge 5-160 mg daily. #3 dyslipidemia: Taking rosuvastatin 40 mg daily. #4 diabetic neuropathy: Takes gabapentin 1 tablet 3 times a day #5 osteoarthritis #6 bipolar disorder #7 peripheral vascular disease and chronic venous insufficiency #8 generalized anxiety disorder #9 aortic atherosclerosis Doing well.  Stable.  Has no complaints or medical concerns today.  Recent nursing home evaluation by [place] healthcare nurse showed stable findings and results without any significant medical concerns  HPI   Prior to Admission medications   Medication Sig Start Date End Date Taking? Authorizing Provider  ACCU-CHEK AVIVA PLUS test strip Use in the morning and after meals as needed. 07/13/18  Yes Jahara Dail, Ines Bloomer, MD  baclofen (LIORESAL) 10 MG tablet One tablet twice during the day and 2 at night 04/21/20  Yes Kathrynn Ducking, MD  carbamazepine (TEGRETOL) 200 MG tablet Take 0.5 tablets (100 mg total) by mouth daily. 02/14/20  Yes Suzzanne Cloud, NP  cetirizine (ZYRTEC) 10 MG tablet Take 10 mg by mouth daily.   Yes [provider]  clonazePAM (KLONOPIN) 0.5 MG tablet Take 1 tablet (0.5 mg  total) by mouth at bedtime. 06/02/20  Yes Kathrynn Ducking, MD  DULoxetine (CYMBALTA) 30 MG capsule Take 1 capsule (30 mg total) by mouth daily. To be combined with 60 mg 11/21/19  Yes Eappen, Ria Clock, MD  DULoxetine (CYMBALTA) 60 MG capsule Take 1 capsule (60 mg total) by mouth daily. To be combined with 30 mg 11/21/19  Yes Eappen, Ria Clock, MD  estradiol (ESTRACE) 0.5 MG tablet Take 1 tablet (0.5 mg total) by mouth daily. 12/26/18  Yes Shelly Bombard, MD  FARXIGA 5 MG TABS tablet Take 1 tablet (5 mg total) by mouth every morning. 03/17/20  Yes Virgel Haro, Ines Bloomer, MD  gabapentin (NEURONTIN) 800 MG tablet Take 1 tablet (800 mg total) by mouth 3 (three) times daily. 03/17/20  Yes Nishtha Raider, Ines Bloomer, MD  glimepiride (AMARYL) 4 MG tablet TAKE ONE TABLET BY MOUTH EVERY MORNING WITH BREAKFAST 03/17/20  Yes Debera Sterba, Ines Bloomer, MD  meloxicam (MOBIC) 15 MG tablet Take 15 mg by mouth daily. 07/04/15  Yes [provider]  omeprazole (PRILOSEC) 20 MG capsule Take 20 mg by mouth as needed (heartburn).   Yes [provider]  pramipexole (MIRAPEX) 0.75 MG tablet One tablet in the morning, 3 at night 06/02/20  Yes Kathrynn Ducking, MD  sitaGLIPtin (JANUVIA) 100 MG tablet Take 1 tablet (100 mg total) by mouth daily. 03/17/20  Yes Amador Braddy, Ines Bloomer, MD  amLODipine-valsartan (EXFORGE) 5-160 MG tablet Take 1 tablet by mouth daily. 03/17/20 06/15/20  Horald Pollen, MD  rosuvastatin (CRESTOR) 40 MG tablet Take 1 tablet (40 mg total) by mouth daily. 03/17/20 06/15/20  Horald Pollen, MD    Allergies  Allergen Reactions  . Belsomra [Suvorexant] Other (See Comments)    Vivid dreams  . Wellbutrin [Bupropion] Other (See Comments)    Irritability and Agitation  . Geodon [Ziprasidone Hcl] Other (See Comments)    Worsened Restless Leg Syndrome  . Lisinopril Cough  . Neupro [Rotigotine] Rash    Patient Active Problem List   Diagnosis Date Noted  . Benzodiazepine dependence in  remission ([place]) 03/17/2020  . Morbid obesity ([place]) 03/17/2020  . Adrenal mass ([place]) 03/17/2020  . PAD (peripheral artery disease) ([place]) 02/18/2020  . Chronic venous insufficiency 02/18/2020  . Hyperlipidemia 02/18/2020  . Bipolar disorder, in full remission, most recent episode mixed ([place]) 01/03/2020  . Attention deficit 01/03/2020  . Aortic atherosclerosis ([place]) 11/20/2019  . Hypokalemia 11/20/2019  . Restless leg syndrome 02/15/2019  . Bipolar I disorder, most recent episode mixed ([place]) 09/14/2018  . GAD (generalized anxiety disorder) 09/14/2018  . 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 Diabetes ([place]) 07/13/2018  . Seizure disorder ([place]) 10/19/2017  . 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 ANA positive 03/05/2015  . Elevated rheumatoid factor 03/05/2015  . Hypertension associated with diabetes ([place]) 09/04/2014  . Avitaminosis D 09/04/2014  . Chronic low back pain 02/18/2014  . Menopausal hot flushes 08/14/2012  . Arthritis of knee, degenerative 04/06/2011  . Benign essential HTN 07/13/2010  . Essential (primary) hypertension 07/13/2010  . Insomnia, unspecified 07/13/2010  . Impaired fasting glucose 07/13/2010  . Chronic pain 01/08/2010  . Affective disorder, major 11/05/2009  . Major depressive disorder 11/05/2009  . Major depressive disorder, single episode, unspecified 11/05/2009  . Allergic rhinitis 07/03/2009    Past Medical History:  Diagnosis Date  . Anxiety   . Arthritis   . Bipolar disorder ([place])   . chronic low back pain   . Chronic low back pain 02/18/2014  . Depression   . Diabetes mellitus without complication ([place])   . Diabetes mellitus, type II ([place])   . Fibroids   . GERD (gastroesophageal reflux disease)   . Gout   . Hyperlipidemia   . Hypertension   . Obesity     Past Surgical History:  Procedure Laterality Date  . ABDOMINAL HYSTERECTOMY    . CHOLECYSTECTOMY    . FOOT SURGERY Left   . KNEE ARTHROPLASTY    . OVARIAN CYST  REMOVAL    . SPINE SURGERY      Social History   Socioeconomic History  . Marital status: Divorced    Spouse name: Not on file  . Number of children: 0  . Years of education: 10  . Highest education level: 10th grade  Occupational History    Employer: UNEMPLOYED  Tobacco Use  . Smoking status: Passive Smoke Exposure - Never Smoker  . Smokeless tobacco: Never Used  . Tobacco comment: room mate smokes  Vaping Use  . Vaping Use: Never used  Substance and Sexual Activity  . Alcohol use: No    Alcohol/week: 0.0 standard drinks  . Drug use: No  . Sexual activity: Not Currently  Other Topics Concern  . Not on file  Social History Narrative   Patient is divorced and lives with a roommate.   Patient is right-handed.   Patient drinks 6 sodas daily.         Social Determinants of Health  Financial Resource Strain: Not on file  Food Insecurity: Not on file  Transportation Needs: Not on file  Physical Activity: Not on file  Stress: Not on file  Social Connections: Not on file  Intimate Partner Violence: Not on file    Family History  Problem Relation Age of Onset  . Diabetes Mother   . Cancer Mother        breast  . Heart disease Mother   . Mental illness Mother        bipolar disorder  . Stroke Mother   . Breast cancer Mother   . Hypertension Mother   . High Cholesterol Mother   . Diabetes Father   . Hypertension Father   . Diabetes Brother   . Bipolar disorder Brother   . Diabetes Brother   . Breast cancer Maternal Aunt   . Breast cancer Maternal Grandmother      Review of Systems  Constitutional: Negative.  Negative for chills and fever.  HENT: Negative.  Negative for congestion and sore throat.   Respiratory: Negative.  Negative for cough and shortness of breath.   Cardiovascular: Negative.  Negative for chest pain and palpitations.  Gastrointestinal: Negative.  Negative for abdominal pain, diarrhea, nausea and vomiting.  Genitourinary: Negative.   Negative for dysuria and hematuria.  Musculoskeletal: Positive for joint pain.  Skin: Negative.  Negative for rash.  Neurological: Negative.  Negative for dizziness and headaches.  All other systems reviewed and are negative.  Today's Vitals   07/16/20 1354  BP: 138/78  Pulse: 68  Temp: 98.7 F (37.1 C)  TempSrc: Oral  SpO2: 93%  Weight: 259 lb 6.4 oz (117.7 kg)  Height: 5\' 4"  (1.626 m)   Body mass index is 44.53 kg/m. Wt Readings from Last 3 Encounters:  07/16/20 259 lb 6.4 oz (117.7 kg)  04/21/20 269 lb 9.6 oz (122.3 kg)  03/17/20 270 lb (122.5 kg)     Physical Exam Vitals reviewed.  Constitutional:      Appearance: Normal appearance.  HENT:     Head: Normocephalic.  Eyes:     Extraocular Movements: Extraocular movements intact.     Pupils: Pupils are equal, round, and reactive to light.  Cardiovascular:     Rate and Rhythm: Normal rate and regular rhythm.     Pulses: Normal pulses.     Heart sounds: Normal heart sounds.  Pulmonary:     Effort: Pulmonary effort is normal.     Breath sounds: Normal breath sounds.  Musculoskeletal:        General: Normal range of motion.     Cervical back: Normal range of motion and neck supple.  Skin:    General: Skin is warm and dry.     Capillary Refill: Capillary refill takes less than 2 seconds.  Neurological:     General: No focal deficit present.     Mental Status: She is alert and oriented to person, place, and time.  Psychiatric:        Mood and Affect: Mood normal.        Behavior: Behavior normal.     Results for orders placed or performed in visit on 07/16/20 (from the past 24 hour(s))  POCT glycosylated hemoglobin (Hb A1C)     Status: Abnormal   Collection Time: 07/16/20  1:51 PM  Result Value Ref Range   Hemoglobin A1C 6.1 (A) 4.0 - 5.6 %   HbA1c POC (<> result, manual entry)     HbA1c, POC (prediabetic range)  HbA1c, POC (controlled diabetic range)      ASSESSMENT & PLAN: A total of 30 minutes was  spent with the patient and counseling/coordination of care regarding multiple chronic medical problems under management, review of all medications, review of most recent office visit notes, review of most recent blood work results including today's hemoglobin A1c, cardiovascular risks associated with diabetes and hypertension, health maintenance items, prognosis, education on nutrition, documentation and need for follow-up.  Aortic atherosclerosis (HCC) Continue rosuvastatin 40 mg daily.  Hypertension associated with diabetes ([place]) Well-controlled hypertension.  Continue Exforge 85-160 mg daily. Diet and nutrition discussed. Well-controlled diabetes with hemoglobin A1c of 6.1.  Continue Januvia 100 mg daily, Farxiga 5 mg daily, and glimepiride 4 mg daily. Diet and nutrition discussed.  PAD (peripheral artery disease) ([place]) Well-controlled.  Asymptomatic.  Has been able to increase level of physical activity after losing some weight.  Bipolar disorder, in full remission, most recent episode mixed ([place]) In full remission.  Continue present medications and psychiatric follow-ups.  GAD (generalized anxiety disorder) Well-controlled.  Continue clonazepam as needed Continue Cymbalta 30 mg daily.  Morbid obesity ([place]) Diet and nutrition discussed.  Making progress in losing weight.  Diagnoses and all orders for this visit:  Hypertension associated with diabetes ([place]) -     POCT glycosylated hemoglobin (Hb A1C) -     AMB Referral to [place]  PAD (peripheral artery disease) ([place]) -     AMB Referral to [facility]  Aortic atherosclerosis ([place]) -     AMB Referral to [facility]ordin[facility]  Bipolar disorder, in full remission, most recent episode mixed ([place]) -     AMB Referral to [place]  GAD (generalized anxiety disorder)  Morbid obesity ([place]) -     AMB Referral to [facility]    Patient Instructions    Diabetes Mellitus and Nutrition, Adult When you have diabetes, or diabetes mellitus, it is very important to have healthy eating habits because your blood sugar (glucose) levels are greatly affected by what you eat and drink. Eating healthy foods in the right amounts, at about the same times every day, can help you:  Control your blood glucose.  Lower your risk of heart disease.  Improve your blood pressure.  Reach or maintain a healthy weight. What can affect my meal plan? Every person with diabetes is different, and each person has different needs for a meal plan. Your health care provider may recommend that you work with a dietitian to make a meal plan that is best for you. Your meal plan may vary depending on factors such as:  The calories you need.  The medicines you take.  Your weight.  Your blood glucose, blood pressure, and cholesterol levels.  Your activity level.  Other health conditions you have, such as heart or kidney disease. How do carbohydrates affect me? Carbohydrates, also called carbs, affect your blood glucose level more than any other type of food. Eating carbs naturally raises the amount of glucose in your blood. Carb counting is a method for keeping track of how many carbs you eat. Counting carbs is important to keep your blood glucose at a healthy level, especially if you use insulin or take certain oral diabetes medicines. It is important to know how many carbs you can safely have in each meal. This is different for every person. Your dietitian can help you calculate how many carbs you should have at each meal and for each snack. How  does alcohol affect me? Alcohol can cause a sudden decrease in blood glucose (hypoglycemia), especially if you use insulin or take certain oral diabetes medicines. Hypoglycemia can be a life-threatening condition. Symptoms of hypoglycemia, such as sleepiness, dizziness, and confusion, are similar to symptoms of having too much  alcohol.  Do not drink alcohol if: ? Your health care provider tells you not to drink. ? You are pregnant, may be pregnant, or are planning to become pregnant.  If you drink alcohol: ? Do not drink on an empty stomach. ? Limit how much you use to:  0-1 drink a day for women.  0-2 drinks a day for men. ? Be aware of how much alcohol is in your drink. In the [place] one drink equals one 12 oz bottle of beer (355 mL), one 5 oz glass of wine (148 mL), or one 1 oz glass of hard liquor (44 mL). ? Keep yourself hydrated with water, diet soda, or unsweetened iced tea.  Keep in mind that regular soda, juice, and other mixers may contain a lot of sugar and must be counted as carbs. What are tips for following this plan? Reading food labels  Start by checking the serving size on the "Nutrition Facts" label of packaged foods and drinks. The amount of calories, carbs, fats, and other nutrients listed on the label is based on one serving of the item. Many items contain more than one serving per package.  Check the total grams (g) of carbs in one serving. You can calculate the number of servings of carbs in one serving by dividing the total carbs by 15. For example, if a food has 30 g of total carbs per serving, it would be equal to 2 servings of carbs.  Check the number of grams (g) of saturated fats and trans fats in one serving. Choose foods that have a low amount or none of these fats.  Check the number of milligrams (mg) of salt (sodium) in one serving. Most people should limit total sodium intake to less than 2,300 mg per day.  Always check the nutrition information of foods labeled as "low-fat" or "nonfat." These foods may be higher in added sugar or refined carbs and should be avoided.  Talk to your dietitian to identify your daily goals for nutrients listed on the label. Shopping  Avoid buying canned, pre-made, or processed foods. These foods tend to be high in fat, sodium, and added  sugar.  Shop around the outside edge of the grocery store. This is where you will most often find fresh fruits and vegetables, bulk grains, fresh meats, and fresh dairy. Cooking  Use low-heat cooking methods, such as baking, instead of high-heat cooking methods like deep frying.  Cook using healthy oils, such as olive, canola, or sunflower oil.  Avoid cooking with butter, cream, or high-fat meats. Meal planning  Eat meals and snacks regularly, preferably at the same times every day. Avoid going long periods of time without eating.  Eat foods that are high in fiber, such as fresh fruits, vegetables, beans, and whole grains. Talk with your dietitian about how many servings of carbs you can eat at each meal.  Eat 4-6 oz (112-168 g) of lean protein each day, such as lean meat, chicken, fish, eggs, or tofu. One ounce (oz) of lean protein is equal to: ? 1 oz (28 g) of meat, chicken, or fish. ? 1 egg. ?  cup (62 g) of tofu.  Eat some foods each day that  contain healthy fats, such as avocado, nuts, seeds, and fish.   What foods should I eat? Fruits Berries. Apples. Oranges. Peaches. Apricots. Plums. Grapes. Mango. Papaya. Pomegranate. Kiwi. Cherries. Vegetables Lettuce. Spinach. Leafy greens, including kale, chard, collard greens, and mustard greens. Beets. Cauliflower. Cabbage. Broccoli. Carrots. Green beans. Tomatoes. Peppers. Onions. Cucumbers. Brussels sprouts. Grains Whole grains, such as whole-wheat or whole-grain bread, crackers, tortillas, cereal, and pasta. Unsweetened oatmeal. Quinoa. Brown or wild rice. Meats and other proteins Seafood. Poultry without skin. Lean cuts of poultry and beef. Tofu. Nuts. Seeds. Dairy Low-fat or fat-free dairy products such as milk, yogurt, and cheese. The items listed above may not be a complete list of foods and beverages you can eat. Contact a dietitian for more information. What foods should I avoid? Fruits Fruits canned with  syrup. Vegetables Canned vegetables. Frozen vegetables with butter or cream sauce. Grains Refined white flour and flour products such as bread, pasta, snack foods, and cereals. Avoid all processed foods. Meats and other proteins Fatty cuts of meat. Poultry with skin. Breaded or fried meats. Processed meat. Avoid saturated fats. Dairy Full-fat yogurt, cheese, or milk. Beverages Sweetened drinks, such as soda or iced tea. The items listed above may not be a complete list of foods and beverages you should avoid. Contact a dietitian for more information. Questions to ask a health care provider  Do I need to meet with a diabetes educator?  Do I need to meet with a dietitian?  What number can I call if I have questions?  When are the best times to check my blood glucose? Where to find more information:  American Diabetes Association: diabetes.org  Academy of Nutrition and Dietetics: www.eatright.[employer] of Diabetes and Digestive and Kidney Diseases: [employer]dk  Association of Diabetes Care and Education Specialists: www.diabeteseducator.org Summary  It is important to have healthy eating habits because your blood sugar (glucose) levels are greatly affected by what you eat and drink.  A healthy meal plan will help you control your blood glucose and maintain a healthy lifestyle.  Your health care provider may recommend that you work with a dietitian to make a meal plan that is best for you.  Keep in mind that carbohydrates (carbs) and alcohol have immediate effects on your blood glucose levels. It is important to count carbs and to use alcohol carefully. This information is not intended to replace advice given to you by your health care provider. Make sure you discuss any questions you have with your health care provider. Document Revised: 01/30/2019 Document Reviewed: 01/30/2019 Elsevier Patient Education  2021 [place],  MD [facility]

## 2020-07-16 NOTE — Assessment & Plan Note (Addendum)
Well-controlled hypertension.  Continue Exforge 85-160 mg daily. Diet and nutrition discussed. Well-controlled diabetes with hemoglobin A1c of 6.1.  Continue Januvia 100 mg daily, Farxiga 5 mg daily, and glimepiride 4 mg daily. Diet and nutrition discussed.

## 2020-07-16 NOTE — Assessment & Plan Note (Signed)
Diet and nutrition discussed.  Making progress in losing weight.

## 2020-07-16 NOTE — Assessment & Plan Note (Signed)
Continue rosuvastatin 40mg daily  

## 2020-07-16 NOTE — Assessment & Plan Note (Signed)
Well-controlled.  Asymptomatic.  Has been able to increase level of physical activity after losing some weight.

## 2020-07-16 NOTE — Progress Notes (Signed)
Diabetic Eye Exam records request e-faxed to [facility].

## 2020-07-17 ENCOUNTER — Telehealth: Payer: Self-pay | Admitting: *Deleted

## 2020-07-17 ENCOUNTER — Other Ambulatory Visit: Payer: Self-pay | Admitting: Emergency Medicine

## 2020-07-17 DIAGNOSIS — F411 Generalized anxiety disorder: Secondary | ICD-10-CM

## 2020-07-17 DIAGNOSIS — F3178 Bipolar disorder, in full remission, most recent episode mixed: Secondary | ICD-10-CM

## 2020-07-17 NOTE — Chronic Care Management (AMB) (Signed)
  Chronic Care Management   Note  07/17/2020 Name: Rachel Luna MRN: [id number] DOB: [birth year]  Rachel Luna is a 65 y.o. year old female who is a primary care patient of Mitchel Honour, Ines Bloomer, MD. I reached out to Rachel Luna by phone today in response to a referral sent by [name]'s PCP, Horald Pollen, MD.     [name] was given information about Chronic Care Management services today including:  1. CCM service includes personalized support from designated clinical staff supervised by her physician, including individualized plan of care and coordination with other care providers 2. 24/7 contact phone numbers for assistance for urgent and routine care needs. 3. Service will only be billed when office clinical staff spend 20 minutes or more in a month to coordinate care. 4. Only one practitioner may furnish and bill the service in a calendar month. 5. The patient may stop CCM services at any time (effective at the end of the month) by phone call to the office staff. 6. The patient will be responsible for cost sharing (co-pay) of up to 20% of the service fee (after annual deductible is met).  Patient agreed to services and verbal consent obtained.   Follow up plan: Telephone appointment with care management team member scheduled for:07/22/2020  [place], [place] [facility]  Direct Dial [phone]

## 2020-07-22 ENCOUNTER — Ambulatory Visit (INDEPENDENT_AMBULATORY_CARE_PROVIDER_SITE_OTHER): Payer: Medicare Other | Admitting: *Deleted

## 2020-07-22 DIAGNOSIS — I152 Hypertension secondary to endocrine disorders: Secondary | ICD-10-CM

## 2020-07-22 DIAGNOSIS — E1159 Type 2 diabetes mellitus with other circulatory complications: Secondary | ICD-10-CM | POA: Diagnosis not present

## 2020-07-22 DIAGNOSIS — E119 Type 2 diabetes mellitus without complications: Secondary | ICD-10-CM

## 2020-07-22 NOTE — Chronic Care Management (AMB) (Signed)
Chronic Care Management   CCM RN Visit Note  07/22/2020 Name: Rachel Luna MRN: [id number] DOB: 1955-07-06  Subjective: Rachel Luna is a 65 y.o. year old female who is a primary care patient of Mitchel Honour, Ines Bloomer, MD. The care management team was consulted for assistance with disease management and care coordination needs.    Engaged with patient by telephone for initial visit in response to provider referral for case management and/or care coordination services.   Consent to Services:  The patient was given information about Chronic Care Management services, agreed to services, and gave verbal consent 07/17/20 prior to initiation of services.  Please see initial visit note for detailed documentation. Patient agreed to services and verbal consent obtained.   Assessment: Review of patient past medical history, allergies, medications, health status, including review of consultants reports, laboratory and other test data, was performed as part of comprehensive evaluation and provision of chronic care management services.   SDOH (Social Determinants of Health) assessments and interventions performed:  SDOH Interventions   Flowsheet Row Most Recent Value  SDOH Interventions   Food Insecurity Interventions Intervention Not Indicated  Housing Interventions Patient Refused  Transportation Interventions Intervention Not Indicated  Depression Interventions/Treatment  Counseling, Currently on Treatment, Medication      CCM Care Plan  Allergies  Allergen Reactions  . Belsomra [Suvorexant] Other (See Comments)    Vivid dreams  . Wellbutrin [Bupropion] Other (See Comments)    Irritability and Agitation  . Geodon [Ziprasidone Hcl] Other (See Comments)    Worsened Restless Leg Syndrome  . Lisinopril Cough  . Neupro [Rotigotine] Rash    Outpatient Encounter Medications as of 07/22/2020  Medication Sig Note  . baclofen (LIORESAL) 10 MG tablet One tablet twice during the day and 2 at  night   . carbamazepine (TEGRETOL) 200 MG tablet Take 0.5 tablets (100 mg total) by mouth daily.   . cetirizine (ZYRTEC) 10 MG tablet Take 10 mg by mouth daily.   . clonazePAM (KLONOPIN) 0.5 MG tablet Take 1 tablet (0.5 mg total) by mouth at bedtime.   . DULoxetine (CYMBALTA) 30 MG capsule Take 1 capsule (30 mg total) by mouth daily. To be combined with 60 mg   . DULoxetine (CYMBALTA) 60 MG capsule Take 1 capsule (60 mg total) by mouth daily. To be combined with 30 mg   . estradiol (ESTRACE) 0.5 MG tablet Take 1 tablet (0.5 mg total) by mouth daily.   Marland Kitchen FARXIGA 5 MG TABS tablet Take 1 tablet (5 mg total) by mouth every morning.   . gabapentin (NEURONTIN) 800 MG tablet Take 1 tablet (800 mg total) by mouth 3 (three) times daily.   Marland Kitchen glimepiride (AMARYL) 4 MG tablet TAKE ONE TABLET BY MOUTH EVERY MORNING WITH BREAKFAST   . meloxicam (MOBIC) 15 MG tablet Take 15 mg by mouth daily.   Marland Kitchen omeprazole (PRILOSEC) 20 MG capsule Take 20 mg by mouth as needed (heartburn). 07/22/2020: 07/22/20: Patient reports takes as needed; reports took last "about a month ago"  . pramipexole (MIRAPEX) 0.75 MG tablet One tablet in the morning, 3 at night   . rosuvastatin (CRESTOR) 40 MG tablet Take 1 tablet (40 mg total) by mouth daily.   . sitaGLIPtin (JANUVIA) 100 MG tablet Take 1 tablet (100 mg total) by mouth daily.   Marland Kitchen ACCU-CHEK AVIVA PLUS test strip Use in the morning and after meals as needed. 03/17/2020: use  . amLODipine-valsartan (EXFORGE) 5-160 MG tablet Take 1 tablet by mouth daily.  07/22/2020: 07/22/20: Patient reports is currently taking as prescribed   No facility-administered encounter medications on file as of 07/22/2020.    Patient Active Problem List   Diagnosis Date Noted  . Benzodiazepine dependence in remission ([place]) 03/17/2020  . Morbid obesity ([place]) 03/17/2020  . Adrenal mass ([place]) 03/17/2020  . PAD (peripheral artery disease) ([place]) 02/18/2020  . Chronic venous insufficiency 02/18/2020  .  Hyperlipidemia 02/18/2020  . Bipolar disorder, in full remission, most recent episode mixed ([place]) 01/03/2020  . Attention deficit 01/03/2020  . Aortic atherosclerosis ([place]) 11/20/2019  . Restless leg syndrome 02/15/2019  . Bipolar I disorder, most recent episode mixed ([place]) 09/14/2018  . GAD (generalized anxiety disorder) 09/14/2018  . 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 Diabetes ([place]) 07/13/2018  . Seizure disorder ([place]) 10/19/2017  . 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 ANA positive 03/05/2015  . Elevated rheumatoid factor 03/05/2015  . Hypertension associated with diabetes ([place]) 09/04/2014  . Avitaminosis D 09/04/2014  . Chronic low back pain 02/18/2014  . Menopausal hot flushes 08/14/2012  . Arthritis of knee, degenerative 04/06/2011  . Benign essential HTN 07/13/2010  . Essential (primary) hypertension 07/13/2010  . Insomnia, unspecified 07/13/2010  . Impaired fasting glucose 07/13/2010  . Chronic pain 01/08/2010  . Affective disorder, major 11/05/2009  . Major depressive disorder 11/05/2009  . Major depressive disorder, single episode, unspecified 11/05/2009    Conditions to be addressed/monitored:HTN and DMII  Care Plan : Diabetes Type 2 (Adult)  Updates made by Knox Royalty, RN since 07/22/2020 12:00 AM    Problem: Disease Progression (Diabetes, Type 2)   Priority: Medium    Long-Range Goal: Disease Progression Prevented or Minimized   Start Date: 07/22/2020  Expected End Date: 01/22/2021  This Visit's Progress: On track  Priority: Medium  Note:   Objective:  Lab Results  Component Value Date   HGBA1C 6.1 (A) 07/16/2020 .   Lab Results  Component Value Date   CREATININE 0.48 (L) 04/21/2020   CREATININE 0.62 03/17/2020   CREATININE 0.64 12/11/2019 .   Marland Kitchen No results found for: EGFR Current Barriers:  Marland Kitchen Knowledge Deficits related to basic Diabetes pathophysiology and self care/management . Does not use cbg meter- reports she continues  to obtain supplies from pharmacy because they do not cost her anything, but does not check blood sugars at home due to not liking fingersticks  . Unable to independently verbalize significance of A1-C values over time Case Manager Clinical Goal(s):  Over the next 6 months, patient will demonstrate improved adherence to prescribed treatment plan for diabetes self care/management as evidenced by patient reporting during CCM RN CM outreach of:  Marland Kitchen Maintenance of A1-C levels at or under goal of 6.5 . Improved adherence to ADA/ carb modified diet  . Adherence to prescribed medication regimen  . Contacting provider for new or worsened symptoms or questions Interventions:  . Collaboration with Horald Pollen, MD regarding development and update of comprehensive plan of care as evidenced by provider attestation and co-signature . Inter-disciplinary care team collaboration (see longitudinal plan of care) . Chart reviewed including relevant office notes, upcoming scheduled appointments, and lab results . Discussed current  clinical condition with patient and confirmed no current clinical or medication concerns; confirmed patient continues to manage medications independently at home and reports adherence to medication regimen; no discrepancies or concerns identified today from medication review . Assessed patient's current understanding of basic DM disease process; provided verbal and printed education around long-term  complications of DM and Y6-Z significance and patient historical A1-C results accordingly; initiated education around importance of routine maintenance provider appointments for DM and encouraged patient to schedule annual podiatry appointment . Positive reinforcement provided for patient's reported weight loss of 55 lbs over 6 months- encouragement provided for patient to continue efforts at weight loss, including benefits of activity, diet modification; encouraged patient to reduce volume  of daily soda (mountain dew) that she consumes . Reviewed scheduled/upcoming provider appointments including: neurology provider October 20, 2020 . Discussed plans with patient for ongoing care management follow up and provided patient with direct contact information for care management team Self-Care Activities . Self administers oral medications as prescribed . Attends all scheduled provider appointments . Adheres to prescribed ADA/carb modified Patient Goals: . Stay at my target A1C (less then 6.5) . Please read over the attached information about A1-C results; we will discuss this during our future telephone call appointments . Keep up the great work avoiding sugar in your diet and losing weight . Your most recent A1-C on 07/16/20 was 6.1- this is a decrease from your last A1-C in January 2022 of 7.2: Great job! Follow Up Plan:  . Telephone follow up appointment with care management team member scheduled for: Tuesday August 26, 2020 at 9:30 am . The patient has been provided with contact information for the care management team and has been advised to call with any health related questions or concerns.     Care Plan : Hypertension (Adult)  Updates made by Knox Royalty, RN since 07/22/2020 12:00 AM    Problem: Disease Progression (Hypertension)   Priority: Medium    Long-Range Goal: Disease Progression Prevented or Minimized   Start Date: 07/22/2020  Expected End Date: 01/22/2021  This Visit's Progress: On track  Priority: Medium  Note:   Objective:  . Last practice recorded BP readings:  BP Readings from Last 3 Encounters:  07/16/20 138/78  04/21/20 140/75  03/17/20 133/79 .   Marland Kitchen Most recent eGFR/CrCl: No results found for: EGFR  No components found for: CRCL Current Barriers:  Marland Kitchen Knowledge Deficits related to basic understanding of hypertension pathophysiology and self care management . Unable to independently verbalize weekly blood pressures at home: patient currently does not  monitor blood pressures at home Case Manager Clinical Goal(s):  Over the next 6 months, patient will demonstrate improved adherence to prescribed treatment plan for hypertension as evidenced by patient reporting during CCM RN CM outreach of: . taking all medications as prescribed,  . monitoring and recording weekly blood pressures at home, review of same during CCM RN CM outreach . adhering to low sodium/DASH diet Interventions:  . Collaboration with Horald Pollen, MD regarding development and update of comprehensive plan of care as evidenced by provider attestation and co-signature . Inter-disciplinary care team collaboration (see longitudinal plan of care) . UNABLE to independently: verbalize weekly blood pressure measurements at home . Evaluation of current treatment plan related to hypertension self management and patient's adherence to plan as established by provider . Provided education to patient re: complications of uncontrolled blood pressure; DASH diet, importance of regular exercise/ activity . Reviewed medications with patient and discussed importance of compliance . Advised patient, providing education and rationale, to monitor/ record blood pressure weekly at home, calling PCP for concerns/ questions . Discussed plans with patient for ongoing care management follow up and provided patient with direct contact information for care management team . Reviewed scheduled/upcoming provider appointments including: neurology provider on  October 20, 2020 Self-Care Activities: . Self administers medications as prescribed . Attends all scheduled provider appointments . Follows a low sodium diet/DASH diet Patient Goals: Marland Kitchen Monitor blood pressure weekly . Write blood pressure results in a log or diary- we will review your weekly blood pressures during our future phone call appointments . Keep up the great work at avoiding salt in your diet . Keep up the great work losing weight: try to  incorporate more activity in your weekly routine and be sure to track your weights at home each week Follow Up Plan:  . Telephone follow up appointment with care management team member scheduled for: Tuesday August 26, 2020 at 9:30 . The patient has been provided with contact information for the care management team and has been advised to call with any health related questions or concerns.      Plan:  Telephone follow up appointment with care management team member scheduled for:  Tuesday August 26, 2020 at 9:30 am  The patient has been provided with contact information for the care management team and has been advised to call with any health related questions or concerns.   [name], RN, BSN, [facility] RN Care Coordination- [place] [phone]: direct office [phone]: mobile

## 2020-07-22 NOTE — Patient Instructions (Signed)
Visit Information   Rachel Luna, it was nice talking with you today.   Please read over the attached information, and keep up the great work in improving your health by managing your diabetes and high blood pressure   I look forward to talking to you again for an update on Tuesday August 26, 2020 at 9:30 am- please be listening out for my call that day.  I will call as close to 9:30 am as possible; I look forward to hearing about your progress.   Please don't hesitate to contact me if I can be of assistance to you before our next scheduled appointment.   [name], RN, BSN, [facility] RN Care Coordination- [place] [phone]: direct office [phone]: mobile    PATIENT GOALS:  Goals Addressed            This Visit's Progress   . Maintain My Target A1C-Diabetes Type 2   On track    Timeframe:  Long-Range Goal Priority:  Medium Start Date:       07/22/20                      Expected End Date:     01/22/21                  Follow Up Date 08/26/20   . Stay at my target A1C (less then 6.5) . Please read over the attached information about A1-C results; we will discuss this during our future telephone call appointments . Keep up the great work avoiding sugar in your diet and losing weight   Why is this important?    Your target A1C is decided together by you and your doctor.   It is based on several things like your age and other health issues.    Notes:  . Your most recent A1-C on 07/16/20 was 6.1- this is a decrease from your last A1-C in January 2022 of 7.2: Great job!    . Track and Manage My Blood Pressure-Hypertension   On track    Timeframe:  Long-Range Goal Priority:  Medium Start Date:            07/22/20                 Expected End Date:     01/22/21                  Follow Up Date 08/26/20   . Monitor blood pressure weekly . Write blood pressure results in a log or diary- we will review your weekly blood pressures during our  future phone call appointments . Keep up the great work at avoiding salt in your diet . Keep up the great work losing weight: try to incorporate more activity in your weekly routine and be sure to track your weights at home each week   Why is this important?    You won't feel high blood pressure, but it can still hurt your blood vessels.   High blood pressure can cause heart or kidney problems. It can also cause a stroke.   Making lifestyle changes like losing a little weight or eating less salt will help.   Checking your blood pressure at home and at different times of the day can help to control blood pressure.   If the doctor prescribes medicine remember to take it the way the doctor ordered.   Call the office if you cannot  afford the medicine or if there are questions about it.           Hemoglobin A1C Test Why am I having this test? You may have the hemoglobin A1C test (A1C test) done to:  Evaluate your risk for developing diabetes (diabetes mellitus).  Diagnose diabetes.  Monitor long-term control of blood sugar (glucose) in people who have diabetes and help make treatment decisions. This test may be done with other blood glucose tests, such as fasting blood glucose and oral glucose tolerance tests. What is being tested? Hemoglobin is a type of protein in the blood that carries oxygen. Glucose attaches to hemoglobin to form glycated hemoglobin. This test checks the amount of glycated hemoglobin in your blood, which is a good indicator of the average amount of glucose in your blood during the past 2-3 months. What kind of sample is taken? A blood sample is required for this test. It is usually collected by inserting a needle into a blood vessel.   Tell a health care provider about:  All medicines you are taking, including vitamins, herbs, eye drops, creams, and over-the-counter medicines.  Any blood disorders you have.  Any surgeries you have had.  Any medical  conditions you have.  Whether you are pregnant or may be pregnant. How are the results reported? Your results will be reported as a percentage that indicates how much of your hemoglobin has glucose attached to it (is glycated). Your health care provider will compare your results to normal ranges that were established after testing a large group of people (reference ranges). Reference ranges may vary among labs and hospitals. For this test, common reference ranges are:  Adult or child without diabetes: 4-5.6%.  Adult or child with diabetes and good blood glucose control: less than 7%. What do the results mean? If you have diabetes:  A result of less than 7% is considered normal, meaning that your blood glucose is well controlled.  A result higher than 7% means that your blood glucose is not well controlled, and your treatment plan may need to be adjusted. If you do not have diabetes:  A result within the reference range is considered normal, meaning that you are not at high risk for diabetes.  A result of 5.7-6.4% means that you have a high risk of developing diabetes, and you have prediabetes. Prediabetes is the condition of having a blood glucose level that is higher than it should be but not high enough for you to be diagnosed with diabetes. Having prediabetes puts you at risk for developing type 2 diabetes. You may have more tests, including a repeat A1C test.  Results of 6.5% or higher on two separate A1C tests mean that you have diabetes. You may have more tests to confirm the diagnosis. Abnormally low A1C values may be caused by:  Pregnancy.  Severe blood loss.  Receiving donated blood (transfusions).  Low red blood cell count (anemia).  Long-term kidney failure.  Some unusual forms (variants) of hemoglobin. Talk with your health care provider about what your results mean. Questions to ask your health care provider Ask your health care provider, or the department that is  doing the test:  When will my results be ready?  How will I get my results?  What are my treatment options?  What other tests do I need?  What are my next steps? Summary  The A1C test may be done to evaluate your risk for developing diabetes, to diagnose diabetes, and to monitor  long-term control of blood sugar (glucose) in people who have diabetes and help make treatment decisions.  Hemoglobin is a type of protein in the blood that carries oxygen. Glucose attaches to hemoglobin to form glycated hemoglobin. This test checks the amount of glycated hemoglobin in your blood, which is a good indicator of the average amount of glucose in your blood during the past 2-3 months.  Talk with your health care provider about what your results mean. This information is not intended to replace advice given to you by your health care provider. Make sure you discuss any questions you have with your health care provider. Document Revised: 11/21/2019 Document Reviewed: 11/21/2019 Elsevier Patient Education  2021 [place].  Consent to CCM Services: Ms. Lortz was given information about Chronic Care Management services today including:  1. CCM service includes personalized support from designated clinical staff supervised by her physician, including individualized plan of care and coordination with other care providers 2. 24/7 contact phone numbers for assistance for urgent and routine care needs. 3. Service will only be billed when office clinical staff spend 20 minutes or more in a month to coordinate care. 4. Only one practitioner may furnish and bill the service in a calendar month. 5. The patient may stop CCM services at any time (effective at the end of the month) by phone call to the office staff. 6. The patient will be responsible for cost sharing (co-pay) of up to 20% of the service fee (after annual deductible is met).  Patient agreed to services and verbal consent obtained.    The  patient verbalized understanding of instructions, educational materials, and care plan provided today and agreed to receive a mailed copy of patient instructions, educational materials, and care plan.   Telephone follow up appointment with care management team member scheduled for: Tuesday August 26, 2020 at 9:30 am  The patient has been provided with contact information for the care management team and has been advised to call with any health related questions or concerns.   [name], RN, BSN, [facility] RN Care Coordination- [place] [phone]: direct office [phone]: mobile   CLINICAL CARE PLAN: Patient Care Plan: Diabetes Type 2 (Adult)    Problem Identified: Disease Progression (Diabetes, Type 2)   Priority: Medium    Long-Range Goal: Disease Progression Prevented or Minimized   Start Date: 07/22/2020  Expected End Date: 01/22/2021  This Visit's Progress: On track  Priority: Medium  Note:   Objective:  Lab Results  Component Value Date   HGBA1C 6.1 (A) 07/16/2020 .   Lab Results  Component Value Date   CREATININE 0.48 (L) 04/21/2020   CREATININE 0.62 03/17/2020   CREATININE 0.64 12/11/2019 .   Marland Kitchen No results found for: EGFR Current Barriers:  Marland Kitchen Knowledge Deficits related to basic Diabetes pathophysiology and self care/management . Does not use cbg meter- reports she continues to obtain supplies from pharmacy because they do not cost her anything, but does not check blood sugars at home due to not liking fingersticks  . Unable to independently verbalize significance of A1-C values over time Case Manager Clinical Goal(s):  Over the next 6 months, patient will demonstrate improved adherence to prescribed treatment plan for diabetes self care/management as evidenced by patient reporting during CCM RN CM outreach of:  Marland Kitchen Maintenance of A1-C levels at or under goal of 6.5 . Improved adherence to ADA/ carb modified diet  . Adherence to  prescribed medication regimen  .  Contacting provider for new or worsened symptoms or questions Interventions:  . Collaboration with Horald Pollen, MD regarding development and update of comprehensive plan of care as evidenced by provider attestation and co-signature . Inter-disciplinary care team collaboration (see longitudinal plan of care) . Chart reviewed including relevant office notes, upcoming scheduled appointments, and lab results . Discussed current  clinical condition with patient and confirmed no current clinical or medication concerns; confirmed patient continues to manage medications independently at home and reports adherence to medication regimen; no discrepancies or concerns identified today from medication review . Assessed patient's current understanding of basic DM disease process; provided verbal and printed education around long-term complications of DM and L8-L significance and patient historical A1-C results accordingly; initiated education around importance of routine maintenance provider appointments for DM and encouraged patient to schedule annual podiatry appointment . Positive reinforcement provided for patient's reported weight loss of 55 lbs over 6 months- encouragement provided for patient to continue efforts at weight loss, including benefits of activity, diet modification; encouraged patient to reduce volume of daily soda (mountain dew) that she consumes . Reviewed scheduled/upcoming provider appointments including: neurology provider October 20, 2020 . Discussed plans with patient for ongoing care management follow up and provided patient with direct contact information for care management team Self-Care Activities . Self administers oral medications as prescribed . Attends all scheduled provider appointments . Adheres to prescribed ADA/carb modified Patient Goals: . Stay at my target A1C (less then 6.5) . Please read over the attached information about A1-C  results; we will discuss this during our future telephone call appointments . Keep up the great work avoiding sugar in your diet and losing weight . Your most recent A1-C on 07/16/20 was 6.1- this is a decrease from your last A1-C in January 2022 of 7.2: Great job! Follow Up Plan:  . Telephone follow up appointment with care management team member scheduled for: Tuesday August 26, 2020 at 9:30 am . The patient has been provided with contact information for the care management team and has been advised to call with any health related questions or concerns.     Patient Care Plan: Hypertension (Adult)    Problem Identified: Disease Progression (Hypertension)   Priority: Medium    Long-Range Goal: Disease Progression Prevented or Minimized   Start Date: 07/22/2020  Expected End Date: 01/22/2021  This Visit's Progress: On track  Priority: Medium  Note:   Objective:  . Last practice recorded BP readings:  BP Readings from Last 3 Encounters:  07/16/20 138/78  04/21/20 140/75  03/17/20 133/79 .   Marland Kitchen Most recent eGFR/CrCl: No results found for: EGFR  No components found for: CRCL Current Barriers:  Marland Kitchen Knowledge Deficits related to basic understanding of hypertension pathophysiology and self care management . Unable to independently verbalize weekly blood pressures at home: patient currently does not monitor blood pressures at home Case Manager Clinical Goal(s):  Over the next 6 months, patient will demonstrate improved adherence to prescribed treatment plan for hypertension as evidenced by patient reporting during CCM RN CM outreach of: . taking all medications as prescribed,  . monitoring and recording weekly blood pressures at home, review of same during CCM RN CM outreach . adhering to low sodium/DASH diet Interventions:  . Collaboration with Horald Pollen, MD regarding development and update of comprehensive plan of care as evidenced by provider attestation and  co-signature . Inter-disciplinary care team collaboration (see longitudinal plan of care) . UNABLE to independently: verbalize weekly blood  pressure measurements at home . Evaluation of current treatment plan related to hypertension self management and patient's adherence to plan as established by provider . Provided education to patient re: complications of uncontrolled blood pressure; DASH diet, importance of regular exercise/ activity . Reviewed medications with patient and discussed importance of compliance . Advised patient, providing education and rationale, to monitor/ record blood pressure weekly at home, calling PCP for concerns/ questions . Discussed plans with patient for ongoing care management follow up and provided patient with direct contact information for care management team . Reviewed scheduled/upcoming provider appointments including: neurology provider on October 20, 2020 Self-Care Activities: . Self administers medications as prescribed . Attends all scheduled provider appointments . Follows a low sodium diet/DASH diet Patient Goals: Marland Kitchen Monitor blood pressure weekly . Write blood pressure results in a log or diary- we will review your weekly blood pressures during our future phone call appointments . Keep up the great work at avoiding salt in your diet . Keep up the great work losing weight: try to incorporate more activity in your weekly routine Follow Up Plan:  . Telephone follow up appointment with care management team member scheduled for: Tuesday August 26, 2020 at 9:30 . The patient has been provided with contact information for the care management team and has been advised to call with any health related questions or concerns.      [name], RN, BSN, [facility] RN Care Coordination- [place] [phone]: direct office [phone]: mobile

## 2020-07-28 ENCOUNTER — Encounter: Payer: Self-pay | Admitting: Emergency Medicine

## 2020-08-05 ENCOUNTER — Other Ambulatory Visit: Payer: Self-pay | Admitting: *Deleted

## 2020-08-05 MED ORDER — CARBAMAZEPINE 200 MG PO TABS: 100.0000 mg | ORAL_TABLET | Freq: Every day | ORAL | 1 refills | Status: DC

## 2020-08-06 ENCOUNTER — Telehealth: Payer: Self-pay | Admitting: Emergency Medicine

## 2020-08-06 NOTE — Telephone Encounter (Signed)
Spoke to patient she thinks Rachel Luna is causing blood sugar to drop. She is having weakness and blurred vision and checking glucose, but was unable to tell me the numbers. Patient states she will stop taking the medication for about 2-3 days and see if she feels better. I advised her to continue to check blood sugar readings and call the office. Thanks - See message

## 2020-08-06 NOTE — Telephone Encounter (Signed)
Thanks

## 2020-08-06 NOTE — Telephone Encounter (Signed)
Patient said that 2 weeks ago that when taking FARXIGA 5 MG TABS tablet her BP drops. She said that she has been on the medication for a little while and her BP dropping just started. She was wondering if there was anything that she needed to do. She can be reached at [phone]. Please advise

## 2020-08-07 ENCOUNTER — Other Ambulatory Visit: Payer: Self-pay | Admitting: Emergency Medicine

## 2020-08-07 DIAGNOSIS — F411 Generalized anxiety disorder: Secondary | ICD-10-CM

## 2020-08-07 DIAGNOSIS — F3178 Bipolar disorder, in full remission, most recent episode mixed: Secondary | ICD-10-CM

## 2020-08-07 NOTE — Telephone Encounter (Signed)
1.Medication Requested: DULoxetine (CYMBALTA) 30 MG capsule    2. Pharmacy (Name, Street, [place]): [place], [place]  3. On Med List: yes   4. Last Visit with PCP: 07-16-20  5. Next visit date with PCP: 01-19-21  Patient said that she spoke with Dr. Mitchel Honour and she said that he told her that he would refill med until she got another psychiatrist. Please advise    Agent: Please be advised that RX refills may take up to 3 business days. We ask that you follow-up with your pharmacy.

## 2020-08-08 ENCOUNTER — Other Ambulatory Visit: Payer: Self-pay | Admitting: *Deleted

## 2020-08-08 DIAGNOSIS — F3178 Bipolar disorder, in full remission, most recent episode mixed: Secondary | ICD-10-CM

## 2020-08-08 DIAGNOSIS — F411 Generalized anxiety disorder: Secondary | ICD-10-CM

## 2020-08-09 MED ORDER — DULOXETINE HCL 30 MG PO CPEP: 30.0000 mg | ORAL_CAPSULE | Freq: Every day | ORAL | 1 refills | Status: DC

## 2020-08-11 ENCOUNTER — Other Ambulatory Visit: Payer: Self-pay | Admitting: Emergency Medicine

## 2020-08-11 DIAGNOSIS — F411 Generalized anxiety disorder: Secondary | ICD-10-CM

## 2020-08-11 DIAGNOSIS — F3178 Bipolar disorder, in full remission, most recent episode mixed: Secondary | ICD-10-CM

## 2020-08-26 ENCOUNTER — Ambulatory Visit (INDEPENDENT_AMBULATORY_CARE_PROVIDER_SITE_OTHER): Payer: Medicare Other | Admitting: *Deleted

## 2020-08-26 DIAGNOSIS — I152 Hypertension secondary to endocrine disorders: Secondary | ICD-10-CM

## 2020-08-26 DIAGNOSIS — E1159 Type 2 diabetes mellitus with other circulatory complications: Secondary | ICD-10-CM

## 2020-08-26 DIAGNOSIS — E119 Type 2 diabetes mellitus without complications: Secondary | ICD-10-CM

## 2020-08-26 NOTE — Chronic Care Management (AMB) (Signed)
Chronic Care Management   CCM RN Visit Note  08/26/2020 Name: Rachel Luna MRN: [id number] DOB: 1955/11/25  Subjective: Rachel Luna is a 65 y.o. year old female who is a primary care patient of Mitchel Honour, Ines Bloomer, MD. The care management team was consulted for assistance with disease management and care coordination needs.    Engaged with patient by telephone for follow up visit in response to provider referral for case management and/or care coordination services.   Consent to Services:  The patient was given information about Chronic Care Management services, agreed to services, and gave verbal consent prior to initiation of services.  Please see initial visit note for detailed documentation.  Patient agreed to services and verbal consent obtained.   Assessment: Review of patient past medical history, allergies, medications, health status, including review of consultants reports, laboratory and other test data, was performed as part of comprehensive evaluation and provision of chronic care management services.   CCM Care Plan  Allergies  Allergen Reactions   Belsomra [Suvorexant] Other (See Comments)    Vivid dreams   Wellbutrin [Bupropion] Other (See Comments)    Irritability and Agitation   Geodon [Ziprasidone Hcl] Other (See Comments)    Worsened Restless Leg Syndrome   Lisinopril Cough   Neupro [Rotigotine] Rash    Outpatient Encounter Medications as of 08/26/2020  Medication Sig Note   ACCU-CHEK AVIVA PLUS test strip Use in the morning and after meals as needed. 03/17/2020: use   amLODipine-valsartan (EXFORGE) 5-160 MG tablet Take 1 tablet by mouth daily. 07/22/2020: 07/22/20: Patient reports is currently taking as prescribed   baclofen (LIORESAL) 10 MG tablet One tablet twice during the day and 2 at night    carbamazepine (TEGRETOL) 200 MG tablet Take 0.5 tablets (100 mg total) by mouth daily.    cetirizine (ZYRTEC) 10 MG tablet Take 10 mg by mouth daily.     clonazePAM (KLONOPIN) 0.5 MG tablet Take 1 tablet (0.5 mg total) by mouth at bedtime.    DULoxetine (CYMBALTA) 60 MG capsule TAKE ONE CAPSULE BY MOUTH ONCE DAILY TAKE WITH 30 MG CAPSULE    estradiol (ESTRACE) 0.5 MG tablet Take 1 tablet (0.5 mg total) by mouth daily.    FARXIGA 5 MG TABS tablet Take 1 tablet (5 mg total) by mouth every morning. (Patient not taking: Reported on 08/26/2020) 08/26/2020: Patient reports has not been taking recently due to side effects of low BP: dizziness, shakiness   gabapentin (NEURONTIN) 800 MG tablet Take 1 tablet (800 mg total) by mouth 3 (three) times daily.    glimepiride (AMARYL) 4 MG tablet TAKE ONE TABLET BY MOUTH EVERY MORNING WITH BREAKFAST    meloxicam (MOBIC) 15 MG tablet Take 15 mg by mouth daily.    omeprazole (PRILOSEC) 20 MG capsule Take 20 mg by mouth as needed (heartburn). 07/22/2020: 07/22/20: Patient reports takes as needed; reports took last "about a month ago"   pramipexole (MIRAPEX) 0.75 MG tablet One tablet in the morning, 3 at night    rosuvastatin (CRESTOR) 40 MG tablet Take 1 tablet (40 mg total) by mouth daily.    sitaGLIPtin (JANUVIA) 100 MG tablet Take 1 tablet (100 mg total) by mouth daily.    No facility-administered encounter medications on file as of 08/26/2020.    Patient Active Problem List   Diagnosis Date Noted   Benzodiazepine dependence in remission ([place]) 03/17/2020   Morbid obesity ([place]) 03/17/2020   Adrenal mass ([place]) 03/17/2020   PAD (peripheral artery disease) (  [place]) 02/18/2020   Chronic venous insufficiency 02/18/2020   Hyperlipidemia 02/18/2020   Bipolar disorder, in full remission, most recent episode mixed ([place]) 01/03/2020   Attention deficit 01/03/2020   Aortic atherosclerosis ([place]) 11/20/2019   Restless leg syndrome 02/15/2019   Bipolar I disorder, most recent episode mixed ([place]) 09/14/2018   GAD (generalized anxiety disorder) 09/14/2018   Insomnia due to mental disorder 09/14/2018   Diabetes ([place]) 07/13/2018    Seizure disorder ([place]) 10/19/2017   Osteoarthritis of ankle and foot 03/27/2015   ANA positive 03/05/2015   Elevated rheumatoid factor 03/05/2015   Hypertension associated with diabetes ([place]) 09/04/2014   Avitaminosis D 09/04/2014   Chronic low back pain 02/18/2014   Menopausal hot flushes 08/14/2012   Arthritis of knee, degenerative 04/06/2011   Benign essential HTN 07/13/2010   Essential (primary) hypertension 07/13/2010   Insomnia, unspecified 07/13/2010   Impaired fasting glucose 07/13/2010   Chronic pain 01/08/2010   Affective disorder, major 11/05/2009   Major depressive disorder 11/05/2009   Major depressive disorder, single episode, unspecified 11/05/2009    Conditions to be addressed/monitored:HTN and DMII  Care Plan : Diabetes Type 2 (Adult)  Updates made by Knox Royalty, RN since 08/26/2020 12:00 AM     Problem: Disease Progression (Diabetes, Type 2)   Priority: Medium     Long-Range Goal: Disease Progression Prevented or Minimized   Start Date: 07/22/2020  Expected End Date: 01/22/2021  This Visit's Progress: On track  Recent Progress: On track  Priority: Medium  Note:   Objective:  Lab Results  Component Value Date   HGBA1C 6.1 (A) 07/16/2020   Lab Results  Component Value Date   CREATININE 0.48 (L) 04/21/2020   CREATININE 0.62 03/17/2020   CREATININE 0.64 12/11/2019   No results found for: EGFR Current Barriers:  Knowledge Deficits related to basic Diabetes pathophysiology and self care/management Does not use cbg meter- reports she continues to obtain supplies from pharmacy because they do not cost her anything, but does not check blood sugars at home due to not liking fingersticks  Unable to independently verbalize significance of A1-C values over time Case Manager Clinical Goal(s):  Over the next 6 months, patient will demonstrate improved adherence to prescribed treatment plan for diabetes self care/management as evidenced by patient reporting  during CCM RN CM outreach of:  Maintenance of A1-C levels at or under goal of 6.5 Improved adherence to ADA/ carb modified diet  Adherence to prescribed medication regimen  Contacting provider for new or worsened symptoms or questions Interventions:  Collaboration with Horald Pollen, MD regarding development and update of comprehensive plan of care as evidenced by provider attestation and co-signature Inter-disciplinary care team collaboration (see longitudinal plan of care) Chart reviewed including relevant office notes, upcoming scheduled appointments, and lab results Discussed current  clinical condition with patient who reports today, "almost daily" episodes of "dizziness, shakiness, fatigue," to the point that she considers calling EMS, but then "passes out on the sofa," states she wakes up and usually "feels better by the end of the day;" reports these episodes have been occurring frequently (almost daily); she wonders if this is related to her medications for DM: reports she has stopped taking farxiga, has not taken since early June- however, the episodes have continued, despite her not taking farxiga Discussed with patient signs/ symptoms low/ high blood sugars: she reports today she has not been monitoring her blood sugars at home: explained to patient that if care providers do not have her  blood sugar readings from home, it will be difficult to determine what is causing her reported episodes: urged patient to begin monitoring/ recording blood sugars at home promptly, as we previously discussed at time of last outreach (fasting and post-prandial)- she verbalizes understanding and agreement.  Encouraged patient to also check her blood sugar promptly when/ if she continues to experience the episodes she describes today, and to write all blood sugar values down Encouraged patient to make appointment with PCP given her reports of the frequency of these ongoing episodes, since "the first of  June": she states she will do: advised her to take all blood sugar readings from home with her to appointment  Reiterated previously provided verbal education around self-health management of DM- monitoring/ recording blood sugars at home, dietary strategies to decrease blood sugar levels: will mail to patient additional educational material, as she reports today she does not think she received previously mailed educational items Will place CCM Pharmacy referral, as patient reports several times that she believes her medications are "causing" her reported recent episodes, as above- patient could benefit from pharmacy input/ review Reviewed scheduled/upcoming provider appointments including: neurology provider October 20, 2020 Discussed plans with patient for ongoing care management follow up and provided patient with direct contact information for care management team Self-Care Activities Self administers oral medications as prescribed Attends all scheduled provider appointments Adheres to prescribed ADA/carb modified Patient Goals: Stay at my target A1C (less then 6.5) Please read over the attached information about A1-C results; we will discuss this during our future telephone call appointments Keep up the great work avoiding sugar in your diet and losing weight It is very important that you begin to monitor and write down your blood sugars at home: this will help your care provider team to know how well your medications are working and if your recently reported episodes of dizziness, shakiness, and fatigue are related to your blood sugar levels-- the symptoms you reported today could be related to either high- or low blood sugars Please begin checking your blood sugars at home twice a day: first thing in the morning before you eat (fasting) and then again 2 hours after a meal: WRITE DOWN each value every time you check it Please make an appointment with Dr. Mitchel Honour to evaluate your recently  reported episodes at home: take your blood sugar readings from home with you to the appointment Follow Up Plan:  Telephone follow up appointment with care management team member scheduled for: Wednesday September 17, 2020 at 9:30 am The patient has been provided with contact information for the care management team and has been advised to call with any health related questions or concerns.      Care Plan : Hypertension (Adult)  Updates made by Knox Royalty, RN since 08/26/2020 12:00 AM     Problem: Disease Progression (Hypertension)   Priority: Medium     Long-Range Goal: Disease Progression Prevented or Minimized   Start Date: 07/22/2020  Expected End Date: 01/22/2021  This Visit's Progress: On track  Recent Progress: On track  Priority: Medium  Note:   Objective:  Last practice recorded BP readings:  BP Readings from Last 3 Encounters:  07/16/20 138/78  04/21/20 140/75  03/17/20 133/79   Most recent eGFR/CrCl: No results found for: EGFR  No components found for: CRCL Current Barriers:  Knowledge Deficits related to basic understanding of hypertension pathophysiology and self care management Unable to independently verbalize weekly blood pressures at home: patient currently does  not monitor blood pressures at home Case Manager Clinical Goal(s):  Over the next 6 months, patient will demonstrate improved adherence to prescribed treatment plan for hypertension as evidenced by patient reporting during CCM RN CM outreach of: taking all medications as prescribed,  monitoring and recording daily blood pressures at home, review of same during CCM RN CM outreach adhering to low sodium/DASH diet Interventions:  Collaboration with Horald Pollen, MD regarding development and update of comprehensive plan of care as evidenced by provider attestation and co-signature Inter-disciplinary care team collaboration (see longitudinal plan of care) UNABLE to independently: verbalize weekly  blood pressure measurements at home Evaluation of current treatment plan related to hypertension self management and patient's adherence to plan as established by provider Confirmed with patient that she is monitoring blood pressures at home very inconsistently; confirmed she is not recording blood pressures at home- again encouraged to begin (now) daily monitoring of blood pressures, given her reported recent episodes of dizziness, shakiness, "passing out," fatigue: explained that these symptoms could be related top blood pressure or blood sugar levels and it will be difficult to determine which unless she completes consistent home monitoring- she verbalizes agreement to begin monitoring and recording daily blood pressures at home; advised patient to monitor blood pressures during her recently reported episodes at home Discussed plans with patient for ongoing care management follow up and provided patient with direct contact information for care management team Reviewed scheduled/upcoming provider appointments including: neurology provider on October 20, 2020 Self-Care Activities: Self administers medications as prescribed Attends all scheduled provider appointments Follows a low sodium diet/DASH diet Patient Goals: Monitor blood pressure every day since you have reported your episodes of dizziness and shakiness since the beginning of June Write ALL blood pressure results in a log or diary- we will review your blood pressures during our future phone call appointments- take your blood pressure and blood sugar readings from home to your doctor appointments; call your doctor if you are concerned about the values you obtain during your home monitoring Please make an appointment with Dr. Mitchel Honour so he can review your blood pressures at home: the symptoms you reported today could be due to blood pressure or blood sugar levels, and unless you check these at home, it will be difficult to determine what is  causing your reported symptoms Keep up the great work at avoiding salt in your diet Keep up the great work losing weight: try to incorporate more activity in your weekly routine and be sure to track your weights at home each week Follow Up Plan:  Telephone follow up appointment with care management team member scheduled for: Wednesday September 17, 2020 at 9:30 The patient has been provided with contact information for the care management team and has been advised to call with any health related questions or concerns.       Plan: Telephone follow up appointment with care management team member scheduled for:  Wednesday September 17, 2020 at 9:30 am The patient has been provided with contact information for the care management team and has been advised to call with any health related questions or concerns.   [name], RN, BSN, [facility] RN Care Coordination- [place] [phone]: direct office [phone]: mobile

## 2020-08-26 NOTE — Patient Instructions (Signed)
Visit Information  Rachel Luna, it was nice talking with you today.   Please read over the attached information, and start now to monitor and record your blood sugars and blood pressures at home -- blood sugars twice a day: first thing in the morning before you eat (fasting) and again 2 hours after a meal WRITE DOWN ALL BLOOD SUGAR VALUES -- daily blood pressures -- if you continue to experience the episodes you described-- please take additional blood sugar and blood pressure measurements at home and write them down on paper so Dr. Mitchel Honour can evaluate to try and determine what is causing the episodes you have described today.   I have asked our pharmacy team to contact you to review your medications: please listen out for a call from the pharmacy team  I look forward to talking to you again for an update on Wednesday September 17, 2020 at 9:30 am -- please be listening out for my call that day.  I will call as close to 9:30 am as possible; I look forward to hearing about your progress.   Please don't hesitate to contact me if I can be of assistance to you before our next scheduled appointment.   [name], RN, BSN, [facility] RN Care Coordination- [place] [phone]: direct office [phone]: mobile    PATIENT GOALS:  Goals Addressed             This Visit's Progress    Maintain My Target A1C-Diabetes Type 2   On track    Timeframe:  Long-Range Goal Priority:  Medium Start Date:       07/22/20                      Expected End Date:     01/22/21                  Follow Up Date 09/17/20   Stay at my target A1C (less then 6.5) Please read over the attached information about A1-C results; we will discuss this during our future telephone call appointments Keep up the great work avoiding sugar in your diet and losing weight It is very important that you begin to monitor and write down your blood sugars at home: this will help your care provider  team to know how well your medications are working and if your recently reported episodes of dizziness, shakiness, and fatigue are related to your blood sugar levels-- the symptoms you reported today could be related to either high- or low blood sugars Please begin checking your blood sugars at home twice a day: first thing in the morning before you eat (fasting) and then again 2 hours after a meal: WRITE DOWN each value every time you check it Please make an appointment with Dr. Mitchel Honour to evaluate your recently reported episodes at home: take your blood sugar readings from home with you to the appointment   Why is this important?   Your target A1C is decided together by you and your doctor.  It is based on several things like your age and other health issues.    Notes:  Your most recent A1-C on 07/16/20 was 6.1- this is a decrease from your last A1-C in January 2022 of 7.2: Great job!      Track and Manage My Blood Pressure-Hypertension   On track    Timeframe:  Long-Range Goal Priority:  Medium Start Date:  07/22/20                 Expected End Date:     01/22/21                  Follow Up Date 09/17/20   Monitor blood pressure every day since you have reported your episodes of dizziness and shakiness since the beginning of June Write ALL blood pressure results in a log or diary- we will review your blood pressures during our future phone call appointments- take your blood pressure and blood sugar readings from home to your doctor appointments; call your doctor if you are concerned about the values you obtain during your home monitoring Please make an appointment with Dr. Mitchel Honour so he can review your blood pressures at home: the symptoms you reported today could be due to blood pressure or blood sugar levels, and unless you check these at home, it will be difficult to determine what is causing your reported symptoms Keep up the great work at avoiding salt in your diet Keep up  the great work losing weight: try to incorporate more activity in your weekly routine and be sure to track your weights at home each week   Why is this important?   You won't feel high blood pressure, but it can still hurt your blood vessels.  High blood pressure can cause heart or kidney problems. It can also cause a stroke.  Making lifestyle changes like losing a little weight or eating less salt will help.  Checking your blood pressure at home and at different times of the day can help to control blood pressure.  If the doctor prescribes medicine remember to take it the way the doctor ordered.  Call the office if you cannot afford the medicine or if there are questions about it.             Hypoglycemia Hypoglycemia is when the sugar (glucose) level in your blood is too low. Low blood sugar can happen to people who have diabetes and people who do not have diabetes. Low blood sugar can happenquickly, and it can be an emergency. What are the causes? This condition happens most often in people who have diabetes. It may be caused by: Diabetes medicine. Not eating enough, or not eating often enough. Doing more physical activity. Drinking alcohol on an empty stomach. If you do not have diabetes, this condition may be caused by: A tumor in the pancreas. Not eating enough, or not eating for long periods at a time (fasting). A very bad infection or illness. Problems after having weight loss (bariatric) surgery. Kidney failure or liver failure. Certain medicines. What increases the risk? This condition is more likely to develop in people who: Have diabetes and take medicines to lower their blood sugar. Abuse alcohol. Have a very bad illness. What are the signs or symptoms? Mild Hunger. Sweating and feeling clammy. Feeling dizzy or light-headed. Being sleepy or having trouble sleeping. Feeling like you may vomit (nauseous). A fast heartbeat. A headache. Blurry vision. Mood  changes, such as: Being grouchy. Feeling worried or nervous (anxious). Tingling or loss of feeling (numbness) around your mouth, lips, or tongue. Moderate Confusion and poor judgment. Behavior changes. Weakness. Uneven heartbeat. Trouble with moving (coordination). Very low Very low blood sugar (severe hypoglycemia) is a medical emergency. It can cause: Fainting. Seizures. Loss of consciousness (coma). Death. How is this treated? Treating low blood sugar Low blood sugar is often treated by eating or  drinking something that has sugar in it right away. The food or drink should contain 15 grams of a fast-acting carb (carbohydrate). Options include: 4 oz (120 mL) of fruit juice. 4 oz (120 mL) of regular soda (not diet soda). A few pieces of hard candy. Check food labels to see how many pieces to eat for 15 grams. 1 Tbsp (15 mL) of sugar or honey. 4 glucose tablets. 1 tube of glucose gel. Treating low blood sugar if you have diabetes If you can think clearly and swallow safely, follow the 15:15 rule: Take 15 grams of a fast-acting carb. Talk with your doctor about how much you should take. Always keep a source of fast-acting carb with you, such as: Glucose tablets (take 4 tablets). A few pieces of hard candy. Check food labels to see how many pieces to eat for 15 grams. 4 oz (120 mL) of fruit juice. 4 oz (120 mL) of regular soda (not diet soda). 1 Tbsp (15 mL) of honey or sugar. 1 tube of glucose gel. Check your blood sugar 15 minutes after you take the carb. If your blood sugar is still at or below 70 mg/dL (3.9 mmol/L), take 15 grams of a carb again. If your blood sugar does not go above 70 mg/dL (3.9 mmol/L) after 3 tries, get help right away. After your blood sugar goes back to normal, eat a meal or a snack within 1 hour.  Treating very low blood sugar If your blood sugar is below 54 mg/dL (3 mmol/L), you have very low blood sugar, or severe hypoglycemia. This is an emergency.  Get medical help right away. If you have very low blood sugar and you cannot eat or drink, you will need to be given a hormone called glucagon. A family member or friend should learn how to check your blood sugar and how to give you glucagon. Ask your doctor if youneed to have an emergency glucagon kit at home. Very low blood sugar may also need to be treated in a hospital. Follow these instructions at home: General instructions Take over-the-counter and prescription medicines only as told by your doctor. Stay aware of your blood sugar as told by your doctor. If you drink alcohol: Limit how much you have to: 0-1 drink a day for women who are not pregnant. 0-2 drinks a day for men. Know how much alcohol is in your drink. In the [place] one drink equals one 12 oz bottle of beer (355 mL), one 5 oz glass of wine (148 mL), or one 1 oz glass of hard liquor (44 mL). Be sure to eat food when you drink alcohol. Know that your body absorbs alcohol quickly. This may lead to low blood sugar later. Be sure to keep checking your blood sugar. Keep all follow-up visits. If you have diabetes:  Always have a fast-acting carb (15 grams) with you to treat low blood sugar. Follow your diabetes care plan as told by your doctor. Make sure you: Know the symptoms of low blood sugar. Check your blood sugar as often as told. Always check it before and after exercise. Always check your blood sugar before you drive. Take your medicines as told. Follow your meal plan. Eat on time. Do not skip meals. Share your diabetes care plan with: Your work or school. People you live with. Carry a card or wear jewelry that says you have diabetes.  Where to find more information American Diabetes Association: www.diabetes.org Contact a doctor if: You have trouble keeping  your blood sugar in your target range. You have low blood sugar often. Get help right away if: You still have symptoms after you eat or drink something that  contains 15 grams of fast-acting carb, and you cannot get your blood sugar above 70 mg/dL by following the 15:15 rule. Your blood sugar is below 54 mg/dL (3 mmol/L). You have a seizure. You faint. These symptoms may be an emergency. Get help right away. Call your local emergency services (911 in the [place] Do not wait to see if the symptoms will go away. Do not drive yourself to the hospital. Summary Hypoglycemia happens when the level of sugar (glucose) in your blood is too low. Low blood sugar can happen to people who have diabetes and people who do not have diabetes. Low blood sugar can happen quickly, and it can be an emergency. Make sure you know the symptoms of low blood sugar and know how to treat it. Always keep a source of sugar (fast-acting carb) with you to treat low blood sugar. This information is not intended to replace advice given to you by your health care provider. Make sure you discuss any questions you have with your healthcare provider. Document Revised: 01/24/2020 Document Reviewed: 01/24/2020 Elsevier Patient Education  2022 [place].  Hypotension As your heart beats, it forces blood through your body. This force is called blood pressure. If you have hypotension, you have low blood pressure. When your blood pressure is too low, you may not get enough blood to your brain or other parts of your body. This may cause you to feel weak, light-headed, have a fast heartbeat, or even pass out (faint). Low blood pressure may be harmless, or it may cause serious problems. What are the causes? Blood loss. Not enough water in the body (dehydration). Heart problems. Hormone problems. Pregnancy. A very bad infection. Not having enough of certain nutrients. Very bad allergic reactions. Certain medicines. What increases the risk? Age. The risk increases as you get older. Conditions that affect the heart or the brain and spinal cord (central nervous system). Taking certain  medicines. Being pregnant. What are the signs or symptoms? Feeling: Weak. Light-headed. Dizzy. Tired (fatigued). Blurred vision. Fast heartbeat. Passing out, in very bad cases. How is this treated? Changing your diet. This may involve eating more salt (sodium) or drinking more water. Taking medicines to raise your blood pressure. Changing how much you take (the dosage) of some of your medicines. Wearing compression stockings. These stockings help to prevent blood clots and reduce swelling in your legs. In some cases, you may need to go to the hospital for: Fluid replacement. This means you will receive fluids through an IV tube. Blood replacement. This means you will receive donated blood through an IV tube (transfusion). Treating an infection or heart problems, if this applies. Monitoring. You may need to be monitored while medicines that you are taking wear off. Follow these instructions at home: Eating and drinking  Drink enough fluids to keep your pee (urine) pale yellow. Eat a healthy diet. Follow instructions from your doctor about what you can eat or drink. A healthy diet includes: Fresh fruits and vegetables. Whole grains. Low-fat (lean) meats. Low-fat dairy products. Eat extra salt only as told. Do not add extra salt to your diet unless your doctor tells you to. Eat small meals often. Avoid standing up quickly after you eat.  Medicines Take over-the-counter and prescription medicines only as told by your doctor. Follow instructions from your doctor  about changing how much you take of your medicines, if this applies. Do not stop or change any of your medicines on your own. General instructions  Wear compression stockings as told by your doctor. Get up slowly from lying down or sitting. Avoid hot showers and a lot of heat as told by your doctor. Return to your normal activities as told by your doctor. Ask what activities are safe for you. Do not use any products  that contain nicotine or tobacco, such as cigarettes, e-cigarettes, and chewing tobacco. If you need help quitting, ask your doctor. Keep all follow-up visits as told by your doctor. This is important.  Contact a doctor if: You throw up (vomit). You have watery poop (diarrhea). You have a fever for more than 2-3 days. You feel more thirsty than normal. You feel weak and tired. Get help right away if: You have chest pain. You have a fast or uneven heartbeat. You lose feeling (have numbness) in any part of your body. You cannot move your arms or your legs. You have trouble talking. You get sweaty or feel light-headed. You pass out. You have trouble breathing. You have trouble staying awake. You feel mixed up (confused). Summary Hypotension is also called low blood pressure. It is when the force of blood pumping through your arteries is too weak. Hypotension may be harmless, or it may cause serious problems. Treatment may include changing your diet and medicines, and wearing compression stockings. In very bad cases, you may need to go to the hospital. This information is not intended to replace advice given to you by your health care provider. Make sure you discuss any questions you have with your healthcare provider. Document Revised: 08/18/2017 Document Reviewed: 08/18/2017 Elsevier Patient Education  [place] patient verbalized understanding of instructions, educational materials, and care plan provided today and agreed to receive a mailed copy of patient instructions, educational materials, and care plan.  Telephone follow up appointment with care management team member scheduled for: Wednesday September 17, 2020 at 9:30 am The patient has been provided with contact information for the care management team and has been advised to call with any health related questions or concerns.   [name], RN, BSN, [facility] RN Care Coordination- [place] [phone]: direct office [phone]: mobile

## 2020-09-17 ENCOUNTER — Ambulatory Visit (INDEPENDENT_AMBULATORY_CARE_PROVIDER_SITE_OTHER): Payer: Medicare Other | Admitting: *Deleted

## 2020-09-17 DIAGNOSIS — I152 Hypertension secondary to endocrine disorders: Secondary | ICD-10-CM

## 2020-09-17 DIAGNOSIS — E119 Type 2 diabetes mellitus without complications: Secondary | ICD-10-CM | POA: Diagnosis not present

## 2020-09-17 DIAGNOSIS — E1159 Type 2 diabetes mellitus with other circulatory complications: Secondary | ICD-10-CM | POA: Diagnosis not present

## 2020-09-17 NOTE — Chronic Care Management (AMB) (Signed)
Chronic Care Management   CCM RN Visit Note  09/17/2020 Name: Rachel Luna MRN: [id number] DOB: 01-06-1956  Subjective: Rachel Luna is a 65 y.o. year old female who is a primary care patient of Mitchel Honour, Ines Bloomer, MD. The care management team was consulted for assistance with disease management and care coordination needs.    Engaged with patient by telephone for follow up visit in response to provider referral for case management and/or care coordination services.   Consent to Services:  The patient was given information about Chronic Care Management services, agreed to services, and gave verbal consent prior to initiation of services.  Please see initial visit note for detailed documentation.  Patient agreed to services and verbal consent obtained.   Assessment: Review of patient past medical history, allergies, medications, health status, including review of consultants reports, laboratory and other test data, was performed as part of comprehensive evaluation and provision of chronic care management services.   CCM Care Plan  Allergies  Allergen Reactions   Belsomra [Suvorexant] Other (See Comments)    Vivid dreams   Wellbutrin [Bupropion] Other (See Comments)    Irritability and Agitation   Geodon [Ziprasidone Hcl] Other (See Comments)    Worsened Restless Leg Syndrome   Lisinopril Cough   Neupro [Rotigotine] Rash    Outpatient Encounter Medications as of 09/17/2020  Medication Sig Note   FARXIGA 5 MG TABS tablet Take 1 tablet (5 mg total) by mouth every morning. 08/26/2020: Patient reports has not been taking recently due to side effects of low BP: dizziness, shakiness   glimepiride (AMARYL) 4 MG tablet TAKE ONE TABLET BY MOUTH EVERY MORNING WITH BREAKFAST    sitaGLIPtin (JANUVIA) 100 MG tablet Take 1 tablet (100 mg total) by mouth daily.    ACCU-CHEK AVIVA PLUS test strip Use in the morning and after meals as needed. 03/17/2020: use   amLODipine-valsartan (EXFORGE)  5-160 MG tablet Take 1 tablet by mouth daily. 09/17/2020: 09/17/20: Patient reports she independently stopped taking this medication due to ongoing episodes of dizziness    baclofen (LIORESAL) 10 MG tablet One tablet twice during the day and 2 at night    carbamazepine (TEGRETOL) 200 MG tablet Take 0.5 tablets (100 mg total) by mouth daily.    cetirizine (ZYRTEC) 10 MG tablet Take 10 mg by mouth daily.    clonazePAM (KLONOPIN) 0.5 MG tablet Take 1 tablet (0.5 mg total) by mouth at bedtime.    DULoxetine (CYMBALTA) 60 MG capsule TAKE ONE CAPSULE BY MOUTH ONCE DAILY TAKE WITH 30 MG CAPSULE    estradiol (ESTRACE) 0.5 MG tablet Take 1 tablet (0.5 mg total) by mouth daily.    gabapentin (NEURONTIN) 800 MG tablet Take 1 tablet (800 mg total) by mouth 3 (three) times daily.    meloxicam (MOBIC) 15 MG tablet Take 15 mg by mouth daily.    omeprazole (PRILOSEC) 20 MG capsule Take 20 mg by mouth as needed (heartburn). 07/22/2020: 07/22/20: Patient reports takes as needed; reports took last "about a month ago"   pramipexole (MIRAPEX) 0.75 MG tablet One tablet in the morning, 3 at night    rosuvastatin (CRESTOR) 40 MG tablet Take 1 tablet (40 mg total) by mouth daily.    No facility-administered encounter medications on file as of 09/17/2020.    Patient Active Problem List   Diagnosis Date Noted   Benzodiazepine dependence in remission ([place]) 03/17/2020   Morbid obesity ([place]) 03/17/2020   Adrenal mass ([place]) 03/17/2020   PAD (peripheral  artery disease) ([place]) 02/18/2020   Chronic venous insufficiency 02/18/2020   Hyperlipidemia 02/18/2020   Bipolar disorder, in full remission, most recent episode mixed ([place]) 01/03/2020   Attention deficit 01/03/2020   Aortic atherosclerosis ([place]) 11/20/2019   Restless leg syndrome 02/15/2019   Bipolar I disorder, most recent episode mixed ([place]) 09/14/2018   GAD (generalized anxiety disorder) 09/14/2018   Insomnia due to mental disorder 09/14/2018   Diabetes ([place])  07/13/2018   Seizure disorder ([place]) 10/19/2017   Osteoarthritis of ankle and foot 03/27/2015   ANA positive 03/05/2015   Elevated rheumatoid factor 03/05/2015   Hypertension associated with diabetes ([place]) 09/04/2014   Avitaminosis D 09/04/2014   Chronic low back pain 02/18/2014   Menopausal hot flushes 08/14/2012   Arthritis of knee, degenerative 04/06/2011   Benign essential HTN 07/13/2010   Essential (primary) hypertension 07/13/2010   Insomnia, unspecified 07/13/2010   Impaired fasting glucose 07/13/2010   Chronic pain 01/08/2010   Affective disorder, major 11/05/2009   Major depressive disorder 11/05/2009   Major depressive disorder, single episode, unspecified 11/05/2009   Conditions to be addressed/monitored:  HTN and DMII  Care Plan : Diabetes Type 2 (Adult)  Updates made by Knox Royalty, RN since 09/17/2020 12:00 AM     Problem: Disease Progression (Diabetes, Type 2)   Priority: Medium     Long-Range Goal: Disease Progression Prevented or Minimized   Start Date: 07/22/2020  Expected End Date: 01/22/2021  This Visit's Progress: On track  Recent Progress: On track  Priority: Medium  Note:   Objective:  Lab Results  Component Value Date   HGBA1C 6.1 (A) 07/16/2020   Lab Results  Component Value Date   CREATININE 0.48 (L) 04/21/2020   CREATININE 0.62 03/17/2020   CREATININE 0.64 12/11/2019   No results found for: EGFR Current Barriers:  Knowledge Deficits related to basic Diabetes pathophysiology and self care/management Does not use cbg meter- reports she continues to obtain supplies from pharmacy because they do not cost her anything, but does not check blood sugars at home due to not liking fingersticks  Unable to independently verbalize significance of A1-C values over time Case Manager Clinical Goal(s):  Over the next 6 months, patient will demonstrate improved adherence to prescribed treatment plan for diabetes self care/management as evidenced by  patient reporting during CCM RN CM outreach of:  Maintenance of A1-C levels at or under goal of 6.5 Improved adherence to ADA/ carb modified diet  Adherence to prescribed medication regimen  Contacting provider for new or worsened symptoms or questions Interventions:  Collaboration with Horald Pollen, MD regarding development and update of comprehensive plan of care as evidenced by provider attestation and co-signature Inter-disciplinary care team collaboration (see longitudinal plan of care) Chart reviewed including relevant office notes, upcoming scheduled appointments, and lab results Discussed current  clinical condition with patient; she reports that she has not had any more episodes of dizziness and shakiness that she reported on 08/25/20- reports she eliminated several medications over a period of days and self-determined that her blood pressure medication (amlodipine) was causing her symptoms- reports she eliminated this medication entirely and is no longer taking Confirms that she continues taking medications for DM: medication list updated accordingly Confirmed patient continues NOT monitoring/ recording blood sugars at home: continues to report that she "doesn't like" doing finger-sticks at home; reports no intention to begin monitoring/ recording blood sugars at home Confirmed patient received previously provided printed information/ education around self-health management of DM: encouraged her  ongoing periodic review of same Confirmed patient endorses that she continues to follow appropriate low-sugar, low carb diet Reminded patient that I have asked CCM Pharmacy team to contact her for medication review/ optimization: encouraged her to listen out for a call from [facility] team, she is agreeable Reviewed scheduled/upcoming provider appointments including: neurology provider October 20, 2020; PCP January 19, 2021: patient verbalizes plans to attend all as scheduled Discussed  plans with patient for ongoing care management follow up and provided patient with direct contact information for care management team Self-Care Activities Self administers oral medications as prescribed Attends all scheduled provider appointments Adheres to prescribed ADA/carb modified Patient Goals: Stay at my target A1C (less than 6.5) Please periodically review the information you have been provided about managing your diabetes at home; we will discuss this during our future telephone call appointments Keep up the great work avoiding sugar and carbohydrates in your diet and losing weight Keep taking your medications as prescribed- please be sure to bring an updated list of your medications to your next appointment with Dr. Mitchel Honour on January 19, 2021 Follow Up Plan:  Telephone follow up appointment with care management team member scheduled for: Tuesday January 20, 2021 at 9:00 am The patient has been provided with contact information for the care management team and has been advised to call with any health related questions or concerns.      Care Plan : Hypertension (Adult)  Updates made by Knox Royalty, RN since 09/17/2020 12:00 AM     Problem: Disease Progression (Hypertension)   Priority: Medium     Long-Range Goal: Disease Progression Prevented or Minimized   Start Date: 07/22/2020  Expected End Date: 01/22/2021  This Visit's Progress: On track  Recent Progress: On track  Priority: Medium  Note:   Objective:  Last practice recorded BP readings:  BP Readings from Last 3 Encounters:  07/16/20 138/78  04/21/20 140/75  03/17/20 133/79   Most recent eGFR/CrCl: No results found for: EGFR  No components found for: CRCL Current Barriers:  Knowledge Deficits related to basic understanding of hypertension pathophysiology and self care management Unable to independently verbalize weekly blood pressures at home: patient currently does not monitor blood pressures at home Case  Manager Clinical Goal(s):  Over the next 6 months, patient will demonstrate improved adherence to prescribed treatment plan for hypertension as evidenced by patient reporting during CCM RN CM outreach of: taking all medications as prescribed,  monitoring and recording daily blood pressures at home, review of same during CCM RN CM outreach adhering to low sodium/DASH diet Interventions:  Collaboration with Horald Pollen, MD regarding development and update of comprehensive plan of care as evidenced by provider attestation and co-signature Inter-disciplinary care team collaboration (see longitudinal plan of care) UNABLE to independently: verbalize weekly blood pressure measurements at home Evaluation of current treatment plan related to hypertension self management and patient's adherence to plan as established by provider Chart reviewed including relevant office notes, upcoming scheduled appointments, and lab results Discussed current  clinical condition with patient; she reports that she has not had any more episodes of dizziness and shakiness that she reported on 08/25/20- reports she eliminated several medications over a period of days and self-determined that her blood pressure medication (amlodipine) was causing her symptoms- reports she eliminated this medication entirely and is no longer taking Confirmed patient has not been monitoring/ recording blood pressures at home now that she is no longer feeling dizzy/ has eliminated amlodipine: does not  like home blood pressure monitoring; encouraged her to periodically monitor at home, especially since she is no longer taking BP medication-- she says she will consider.  Encouraged her to bring recorded blood pressures to Dr. Mitchel Honour office visit in November so he can review and determine if she needs to be on medication Will provide printed information/ education around blood pressure management Confirmed patient endorses that she continues to  follow appropriate low-sugar, low carb, low salt diet Reminded patient that I have asked CCM Pharmacy team to contact her for medication review/ optimization: encouraged her to listen out for a call from [facility] team, she is agreeable Reviewed scheduled/upcoming provider appointments including: neurology provider October 20, 2020; PCP January 19, 2021: patient verbalizes plans to attend all as scheduled Discussed plans with patient for ongoing care management follow up and provided patient with direct contact information for care management team Self-Care Activities: Self administers medications as prescribed Attends all scheduled provider appointments Follows a low sodium diet/DASH diet Patient Goals: If possible, please try to begin monitoring/ writing down your blood pressures periodically, especially since you have decided to stop taking amlodipine: this will help Dr. Mitchel Honour know how your blood pressures are doing when you see him again on January 19, 2021; please bring your recorded blood pressures to your office visit with Dr. Mitchel Honour so he can review them Check your blood pressure if you begin to have episodes of dizziness Keep up the great work at avoiding salt in your diet Keep up the great work losing weight: try to incorporate more activity in your weekly routine and be sure to track your weights at home each week Follow Up Plan:  Telephone follow up appointment with care management team member scheduled for: Tuesday January 20, 2021 at 9:00 The patient has been provided with contact information for the care management team and has been advised to call with any health related questions or concerns.      Plan: Telephone follow up appointment with care management team member scheduled for:  Tuesday, January 20, 2021 at 9:00 am The patient has been provided with contact information for the care management team and has been advised to call with any health related questions or  concerns.   [name], RN, BSN, [facility] RN Care Coordination- [place] [phone]: direct office [phone]: mobile

## 2020-09-17 NOTE — Patient Instructions (Signed)
Visit Information  Rachel Luna, it was nice talking with you today.   Please read over the attached information, and try to occasionally take and write down your blood pressures so you may take to your appointment with Dr. Mitchel Honour on January 19, 2021-- this will help him make sure your blood pressures are under control; this is especially important since you have stopped taking your prescribed blood pressure medication   I look forward to talking to you again for an update on Tuesday, January 20, 2021 at 9:00 am- please be listening out for my call that day.  I will call as close to 9:00 am as possible; I look forward to hearing about your progress.   Please don't hesitate to contact me if I can be of assistance to you before our next scheduled appointment.   Rachel Rack, RN, BSN, [facility] RN Care Coordination- [place] [phone]: direct office [phone]: mobile    PATIENT GOALS:  Goals Addressed             This Visit's Progress    Maintain My Target A1C-Diabetes Type 2   On track    Timeframe:  Long-Range Goal Priority:  Medium Start Date:       07/22/20                      Expected End Date:     01/22/21                  Follow Up Date 01/20/21   Stay at my target A1C (less than 6.5) Please periodically review the information you have been provided about managing your diabetes at home; we will discuss this during our future telephone call appointments Keep up the great work avoiding sugar and carbohydrates in your diet and losing weight Keep taking your medications as prescribed- please be sure to bring an updated list of your medications to your next appointment with Dr. Mitchel Honour on January 19, 2021    Why is this important?   Your target A1C is decided together by you and your doctor.  It is based on several things like your age and other health issues.    Notes:  Your most recent A1-C on 07/16/20 was 6.1- this is a decrease  from your last A1-C in January 2022 of 7.2: Great job!      Track and Manage My Blood Pressure-Hypertension   On track    Timeframe:  Long-Range Goal Priority:  Medium Start Date:            07/22/20                 Expected End Date:     01/22/21                  Follow Up Date 01/20/21   If possible, please try to begin monitoring/ writing down your blood pressures periodically, especially since you have decided to stop taking amlodipine: this will help Dr. Mitchel Honour know how your blood pressures are doing when you see him again on January 19, 2021; please bring your recorded blood pressures to your office visit with Dr. Mitchel Honour so he can review them Check your blood pressure if you begin to have episodes of dizziness Keep up the great work at avoiding salt in your diet Keep up the great work losing weight: try to incorporate more activity in your weekly routine and be sure to  track your weights at home each week   Why is this important?   You won't feel high blood pressure, but it can still hurt your blood vessels.  High blood pressure can cause heart or kidney problems. It can also cause a stroke.  Making lifestyle changes like losing a little weight or eating less salt will help.  Checking your blood pressure at home and at different times of the day can help to control blood pressure.  If the doctor prescribes medicine remember to take it the way the doctor ordered.  Call the office if you cannot afford the medicine or if there are questions about it.             Managing Your Hypertension Hypertension, also called high blood pressure, is when the force of the blood pressing against the walls of the arteries is too strong. Arteries are blood vessels that carry blood from your heart throughout your body. Hypertension forces the heart to work harder to pump blood and may cause the arteries tobecome narrow or stiff. Understanding blood pressure readings Your personal target blood  pressure may vary depending on your medical conditions, your age, and other factors. A blood pressure reading includes a higher number over a lower number. Ideally, your blood pressure should be below 120/80. You should know that: The first, or top, number is called the systolic pressure. It is a measure of the pressure in your arteries as your heart beats. The second, or bottom number, is called the diastolic pressure. It is a measure of the pressure in your arteries as the heart relaxes. Blood pressure is classified into four stages. Based on your blood pressure reading, your health care provider may use the following stages to determine what type of treatment you need, if any. Systolic pressure and diastolicpressure are measured in a unit called mmHg. Normal Systolic pressure: below 025. Diastolic pressure: below 80. Elevated Systolic pressure: 427-062. Diastolic pressure: below 80. Hypertension stage 1 Systolic pressure: 376-283. Diastolic pressure: 15-17. Hypertension stage 2 Systolic pressure: 616 or above. Diastolic pressure: 90 or above. How can this condition affect me? Managing your hypertension is an important responsibility. Over time, hypertension can damage the arteries and decrease blood flow to important parts of the body, including the brain, heart, and kidneys. Having untreated or uncontrolled hypertension can lead to: A heart attack. A stroke. A weakened blood vessel (aneurysm). Heart failure. Kidney damage. Eye damage. Metabolic syndrome. Memory and concentration problems. Vascular dementia. What actions can I take to manage this condition? Hypertension can be managed by making lifestyle changes and possibly by taking medicines. Your health care provider will help you make a plan to bring yourblood pressure within a normal range. Nutrition  Eat a diet that is high in fiber and potassium, and low in salt (sodium), added sugar, and fat. An example eating plan is  called the Dietary Approaches to Stop Hypertension (DASH) diet. To eat this way: Eat plenty of fresh fruits and vegetables. Try to fill one-half of your plate at each meal with fruits and vegetables. Eat whole grains, such as whole-wheat pasta, brown rice, or whole-grain bread. Fill about one-fourth of your plate with whole grains. Eat low-fat dairy products. Avoid fatty cuts of meat, processed or cured meats, and poultry with skin. Fill about one-fourth of your plate with lean proteins such as fish, chicken without skin, beans, eggs, and tofu. Avoid pre-made and processed foods. These tend to be higher in sodium, added sugar, and fat.  Reduce your daily sodium intake. Most people with hypertension should eat less than 1,500 mg of sodium a day.  Lifestyle  Work with your health care provider to maintain a healthy body weight or to lose weight. Ask what an ideal weight is for you. Get at least 30 minutes of exercise that causes your heart to beat faster (aerobic exercise) most days of the week. Activities may include walking, swimming, or biking. Include exercise to strengthen your muscles (resistance exercise), such as weight lifting, as part of your weekly exercise routine. Try to do these types of exercises for 30 minutes at least 3 days a week. Do not use any products that contain nicotine or tobacco, such as cigarettes, e-cigarettes, and chewing tobacco. If you need help quitting, ask your health care provider. Control any long-term (chronic) conditions you have, such as high cholesterol or diabetes. Identify your sources of stress and find ways to manage stress. This may include meditation, deep breathing, or making time for fun activities.  Alcohol use Do not drink alcohol if: Your health care provider tells you not to drink. You are pregnant, may be pregnant, or are planning to become pregnant. If you drink alcohol: Limit how much you use to: 0-1 drink a day for women. 0-2 drinks a day  for men. Be aware of how much alcohol is in your drink. In the [place] one drink equals one 12 oz bottle of beer (355 mL), one 5 oz glass of wine (148 mL), or one 1 oz glass of hard liquor (44 mL). Medicines Your health care provider may prescribe medicine if lifestyle changes are not enough to get your blood pressure under control and if: Your systolic blood pressure is 130 or higher. Your diastolic blood pressure is 80 or higher. Take medicines only as told by your health care provider. Follow the directions carefully. Blood pressure medicines must be taken as told by your health care provider. The medicine does not work as well when you skip doses. Skippingdoses also puts you at risk for problems. Monitoring Before you monitor your blood pressure: Do not smoke, drink caffeinated beverages, or exercise within 30 minutes before taking a measurement. Use the bathroom and empty your bladder (urinate). Sit quietly for at least 5 minutes before taking measurements. Monitor your blood pressure at home as told by your health care provider. To do this: Sit with your back straight and supported. Place your feet flat on the floor. Do not cross your legs. Support your arm on a flat surface, such as a table. Make sure your upper arm is at heart level. Each time you measure, take two or three readings one minute apart and record the results. You may also need to have your blood pressure checked regularly by your healthcare provider. General information Talk with your health care provider about your diet, exercise habits, and other lifestyle factors that may be contributing to hypertension. Review all the medicines you take with your health care provider because there may be side effects or interactions. Keep all visits as told by your health care provider. Your health care provider can help you create and adjust your plan for managing your high blood pressure. Where to find more information National  Heart, Lung, and Blood Institute: https://wilson-eaton.com/ American Heart Association: www.heart.org Contact a health care provider if: You think you are having a reaction to medicines you have taken. You have repeated (recurrent) headaches. You feel dizzy. You have swelling in your ankles. You have trouble with  your vision. Get help right away if: You develop a severe headache or confusion. You have unusual weakness or numbness, or you feel faint. You have severe pain in your chest or abdomen. You vomit repeatedly. You have trouble breathing. These symptoms may represent a serious problem that is an emergency. Do not wait to see if the symptoms will go away. Get medical help right away. Call your local emergency services (911 in the [place] Do not drive yourself to the hospital. Summary Hypertension is when the force of blood pumping through your arteries is too strong. If this condition is not controlled, it may put you at risk for serious complications. Your personal target blood pressure may vary depending on your medical conditions, your age, and other factors. For most people, a normal blood pressure is less than 120/80. Hypertension is managed by lifestyle changes, medicines, or both. Lifestyle changes to help manage hypertension include losing weight, eating a healthy, low-sodium diet, exercising more, stopping smoking, and limiting alcohol. This information is not intended to replace advice given to you by your health care provider. Make sure you discuss any questions you have with your healthcare provider. Document Revised: 03/30/2019 Document Reviewed: 01/23/2019 Elsevier Patient Education  2022 [employer].   The patient verbalized understanding of instructions, educational materials, and care plan provided today and agreed to receive a mailed copy of patient instructions, educational materials, and care plan.  Telephone follow up appointment with care management team member scheduled  for:  Tuesday, January 20, 2021 at 9:00 am The patient has been provided with contact information for the care management team and has been advised to call with any health related questions or concerns.   Rachel Rack, RN, BSN, [facility] RN Care Coordination- [place] [phone]: direct office [phone]: mobile

## 2020-09-22 ENCOUNTER — Other Ambulatory Visit: Payer: Self-pay | Admitting: Emergency Medicine

## 2020-09-22 DIAGNOSIS — Z1231 Encounter for screening mammogram for malignant neoplasm of breast: Secondary | ICD-10-CM

## 2020-09-27 ENCOUNTER — Other Ambulatory Visit: Payer: Self-pay | Admitting: Emergency Medicine

## 2020-09-27 DIAGNOSIS — G2581 Restless legs syndrome: Secondary | ICD-10-CM

## 2020-09-29 ENCOUNTER — Other Ambulatory Visit: Payer: Self-pay | Admitting: *Deleted

## 2020-09-29 ENCOUNTER — Telehealth: Payer: Self-pay | Admitting: Neurology

## 2020-09-29 ENCOUNTER — Other Ambulatory Visit: Payer: Self-pay

## 2020-09-29 MED ORDER — CLONAZEPAM 0.5 MG PO TABS: 0.5000 mg | ORAL_TABLET | Freq: Every day | ORAL | 3 refills | Status: DC

## 2020-09-29 NOTE — Telephone Encounter (Signed)
Pt is requesting a refill for  baclofen (LIORESAL) 10 MG tablet.  Pharmacy: [facility]

## 2020-09-29 NOTE — Telephone Encounter (Signed)
The prescription for clonazepam was renewed.

## 2020-09-29 NOTE — Telephone Encounter (Signed)
Contacted pt to inform her that her baclofen should be good for refills until August. She also had requested klonopin for refill, which was approved.

## 2020-09-29 NOTE — Telephone Encounter (Signed)
[place] drug registry was checked, last filled 08/29/2020. Last seen 04/19/20.  Advised to Fu 6 mo, has upcoming appt 10/27/20  I did not mean to approve this medication and it printed out. Please advise and resend.

## 2020-09-29 NOTE — Addendum Note (Signed)
Addended by: Kathrynn Ducking on: 09/29/2020 04:39 PM   Modules accepted: Orders

## 2020-10-08 ENCOUNTER — Telehealth: Payer: Self-pay | Admitting: Neurology

## 2020-10-08 NOTE — Telephone Encounter (Signed)
I have updated the pharmacy.  To note the name of the Pharmacy is [facility] in [place].   Thanks for the update on this.

## 2020-10-08 NOTE — Telephone Encounter (Signed)
This is FYI, pt does not need a refill just yet but she states when she does she is asking the baclofen (LIORESAL) 10 MG tablet be send to [facility] [phone] when it's refill time, no call back requested at this time

## 2020-10-08 NOTE — Telephone Encounter (Signed)
Pharmacy preference was updated in EMR.

## 2020-10-15 ENCOUNTER — Other Ambulatory Visit: Payer: Self-pay

## 2020-10-15 ENCOUNTER — Telehealth: Payer: Self-pay | Admitting: Neurology

## 2020-10-15 MED ORDER — BACLOFEN 10 MG PO TABS: ORAL_TABLET | ORAL | 1 refills | Status: DC

## 2020-10-15 NOTE — Telephone Encounter (Signed)
Mickel Baas @ [facility] [phone] is asking for a script to be sent to them for pt's baclofen (LIORESAL) 10 MG tablet

## 2020-10-15 NOTE — Telephone Encounter (Signed)
approved

## 2020-10-20 ENCOUNTER — Ambulatory Visit: Payer: Medicare Other | Admitting: Neurology

## 2020-10-27 ENCOUNTER — Encounter: Payer: Self-pay | Admitting: *Deleted

## 2020-10-27 ENCOUNTER — Ambulatory Visit (INDEPENDENT_AMBULATORY_CARE_PROVIDER_SITE_OTHER): Payer: Medicare Other | Admitting: Neurology

## 2020-10-27 ENCOUNTER — Encounter: Payer: Self-pay | Admitting: Neurology

## 2020-10-27 VITALS — BP 142/80 | HR 68 | Ht 64.5 in | Wt 261.0 lb

## 2020-10-27 DIAGNOSIS — G2581 Restless legs syndrome: Secondary | ICD-10-CM

## 2020-10-27 DIAGNOSIS — R55 Syncope and collapse: Secondary | ICD-10-CM

## 2020-10-27 DIAGNOSIS — G40909 Epilepsy, unspecified, not intractable, without status epilepticus: Secondary | ICD-10-CM | POA: Diagnosis not present

## 2020-10-27 HISTORY — DX: Syncope and collapse: R55

## 2020-10-27 MED ORDER — PRAMIPEXOLE DIHYDROCHLORIDE 0.75 MG PO TABS: ORAL_TABLET | ORAL | 1 refills | Status: DC

## 2020-10-27 NOTE — Progress Notes (Signed)
Reason for visit: Restless leg syndrome, right shoulder pain, episodes of near syncope and syncope  Rachel Luna is an 65 y.o. female  History of present illness:  Rachel Luna is a 65 year old right-handed white female with a history of restless leg syndrome.  In the past, she has not been able to tolerate the Neupro patch, she has been on Mirapex taking the 0.75 mg tablets, she takes 1 in the morning and 3 in the evening.  With this regimen, she is sleeping fairly well.  She generally has more trouble with the right leg than the left with the restless legs.  She does have low back pain without radiation down to the legs.  Within the last several weeks she has developed some right shoulder discomfort that occurs with trying to elevate the arm.  She sees Dr. French Ana from orthopedic surgery for her right knee, she will be seeing him in the near future.  The patient also reports a 6-week history of episodes of sweats and associated syncope or near syncope.  The patient indicates that she feels partially paralyzed with profuse sweating, she will have shortness of breath and feels as if her heart is "coming out of her chest".  She has not contacted her primary care physician regarding this.  She has not checked her blood sugar around the time of the event.  She oftentimes will have the events in the morning after eating breakfast and taking her medication.  Past Medical History:  Diagnosis Date   Anxiety    Arthritis    Bipolar disorder ([place])    chronic low back pain    Chronic low back pain 02/18/2014   Depression    Diabetes mellitus without complication ([place])    Diabetes mellitus, type II ([place])    Fibroids    GERD (gastroesophageal reflux disease)    Gout    Hyperlipidemia    Hypertension    Obesity     Past Surgical History:  Procedure Laterality Date   ABDOMINAL HYSTERECTOMY     CHOLECYSTECTOMY     FOOT SURGERY Left    KNEE ARTHROPLASTY     OVARIAN CYST REMOVAL     SPINE  SURGERY      Family History  Problem Relation Age of Onset   Diabetes Mother    Cancer Mother        breast   Heart disease Mother    Mental illness Mother        bipolar disorder   Stroke Mother    Breast cancer Mother    Hypertension Mother    High Cholesterol Mother    Diabetes Father    Hypertension Father    Diabetes Brother    Bipolar disorder Brother    Diabetes Brother    Breast cancer Maternal Aunt    Breast cancer Maternal Grandmother     Social history:  reports that she is a non-smoker but has been exposed to tobacco smoke. She has never used smokeless tobacco. She reports that she does not drink alcohol and does not use drugs.    Allergies  Allergen Reactions   Belsomra [Suvorexant] Other (See Comments)    Vivid dreams   Wellbutrin [Bupropion] Other (See Comments)    Irritability and Agitation   Geodon [Ziprasidone Hcl] Other (See Comments)    Worsened Restless Leg Syndrome   Lisinopril Cough   Neupro [Rotigotine] Rash    Medications:  Prior to Admission medications   Medication Sig Start  Date End Date Taking? Authorizing Provider  ACCU-CHEK AVIVA PLUS test strip Use in the morning and after meals as needed. 07/13/18  Yes Sagardia, Ines Bloomer, MD  baclofen (LIORESAL) 10 MG tablet One tablet twice during the day and 2 at night 10/15/20  Yes Kathrynn Ducking, MD  carbamazepine (TEGRETOL) 200 MG tablet Take 0.5 tablets (100 mg total) by mouth daily. 08/05/20  Yes Kathrynn Ducking, MD  cetirizine (ZYRTEC) 10 MG tablet Take 10 mg by mouth daily.   Yes [provider]  clonazePAM (KLONOPIN) 0.5 MG tablet Take 1 tablet (0.5 mg total) by mouth at bedtime. 09/29/20  Yes Kathrynn Ducking, MD  DULoxetine (CYMBALTA) 60 MG capsule TAKE ONE CAPSULE BY MOUTH ONCE DAILY TAKE WITH 30 MG CAPSULE 08/11/20  Yes Sagardia, Ines Bloomer, MD  estradiol (ESTRACE) 0.5 MG tablet Take 1 tablet (0.5 mg total) by mouth daily. 12/26/18  Yes Shelly Bombard, MD  FARXIGA 5 MG TABS  tablet Take 1 tablet (5 mg total) by mouth every morning. 03/17/20  Yes Sagardia, Ines Bloomer, MD  gabapentin (NEURONTIN) 800 MG tablet TAKE ONE TABLET BY MOUTH THREE TIMES DAILY 09/27/20  Yes Sagardia, Ines Bloomer, MD  glimepiride (AMARYL) 4 MG tablet TAKE ONE TABLET BY MOUTH EVERY MORNING WITH BREAKFAST 03/17/20  Yes Sagardia, Ines Bloomer, MD  meloxicam (MOBIC) 15 MG tablet Take 15 mg by mouth daily. 07/04/15  Yes [provider]  omeprazole (PRILOSEC) 20 MG capsule Take 20 mg by mouth as needed (heartburn).   Yes [provider]  pramipexole (MIRAPEX) 0.75 MG tablet One tablet in the morning, 3 at night 06/02/20  Yes Kathrynn Ducking, MD  sitaGLIPtin (JANUVIA) 100 MG tablet Take 1 tablet (100 mg total) by mouth daily. 03/17/20  Yes Sagardia, Ines Bloomer, MD  amLODipine-valsartan (EXFORGE) 5-160 MG tablet Take 1 tablet by mouth daily. 03/17/20 06/15/20  Horald Pollen, MD  rosuvastatin (CRESTOR) 40 MG tablet Take 1 tablet (40 mg total) by mouth daily. 07/16/20 10/14/20  Horald Pollen, MD    ROS:  Out of a complete 14 system review of symptoms, the patient complains only of the following symptoms, and all other reviewed systems are negative.  Near-syncope, syncope, sweats Restless legs Knee pain, low back pain Right shoulder discomfort  Blood pressure (!) 142/80, pulse 68, height 5' 4.5" (1.638 m), weight 261 lb (118.4 kg), SpO2 93 %.  Physical Exam  General: The patient is alert and cooperative at the time of the examination.  The patient is markedly obese.  Neuromuscular: Range of movement of the right shoulder elicits pain, the patient has discomfort in the right shoulder after elevation of the arm more than 45 degrees.  Skin: No significant peripheral edema is noted.   Neurologic Exam  Mental status: The patient is alert and oriented x 3 at the time of the examination. The patient has apparent normal recent and remote memory, with an apparently normal  attention span and concentration ability.   Cranial nerves: Facial symmetry is present. Speech is normal, no aphasia or dysarthria is noted. Extraocular movements are full. Visual fields are full.  Motor: The patient has good strength in all 4 extremities.  Sensory examination: Soft touch sensation is symmetric on the face and arms, decreased on the right leg as compared to the left.  Coordination: The patient has good finger-nose-finger and heel-to-shin bilaterally.  Gait and station: The patient has a normal gait. Romberg is negative. No drift is seen.  Reflexes:  Deep tendon reflexes are symmetric.   Assessment/Plan:  1.  Restless leg syndrome  2.  Obesity  3.  Right shoulder discomfort, possible rotator cuff tear  4.  Episodes of syncope and near syncope  5.  History of seizures, well controlled  The patient reports new issues with syncope or near syncope associated with shortness of breath, diaphoresis, and increased heart rate.  I have indicated that the patient should contact her primary care physician regarding these events.  I will set the patient up for a 30-day cardiac monitor study, she claims that she has had 5 or 6 such episodes within the last 6 weeks.  She may need full cardiac evaluation with 2D echocardiogram and EKG.  The patient was given a prescription for her Mirapex, she will follow-up here in 6 months, in the future she can be followed through Dr. Brett Fairy.  Jill Alexanders MD 10/27/2020 10:20 AM  [facility] [address] [place], [place] [address]  Phone [phone] Fax [phone]

## 2020-10-27 NOTE — Progress Notes (Signed)
Patient ID: Rachel Luna, female   DOB: 1955/04/29, 65 y.o.   MRN: [id number] Patient enrolled for [employer] to ship a 30 day Cardiac Event Monitor to her home. Letter with instructions mailed to patient.

## 2020-10-29 ENCOUNTER — Telehealth: Payer: Self-pay

## 2020-10-29 NOTE — Telephone Encounter (Signed)
Pt called request an ekg be done by way of the neurologist suggesting she she her PCP.

## 2020-10-30 ENCOUNTER — Ambulatory Visit (INDEPENDENT_AMBULATORY_CARE_PROVIDER_SITE_OTHER): Payer: Medicare Other | Admitting: Emergency Medicine

## 2020-10-30 ENCOUNTER — Other Ambulatory Visit: Payer: Self-pay

## 2020-10-30 ENCOUNTER — Encounter: Payer: Self-pay | Admitting: Emergency Medicine

## 2020-10-30 VITALS — BP 138/80 | HR 82 | Temp 97.8°F | Ht 64.0 in | Wt 258.0 lb

## 2020-10-30 DIAGNOSIS — R002 Palpitations: Secondary | ICD-10-CM | POA: Diagnosis not present

## 2020-10-30 DIAGNOSIS — I152 Hypertension secondary to endocrine disorders: Secondary | ICD-10-CM

## 2020-10-30 DIAGNOSIS — I10 Essential (primary) hypertension: Secondary | ICD-10-CM

## 2020-10-30 DIAGNOSIS — F410 Panic disorder [episodic paroxysmal anxiety] without agoraphobia: Secondary | ICD-10-CM | POA: Diagnosis not present

## 2020-10-30 DIAGNOSIS — E1159 Type 2 diabetes mellitus with other circulatory complications: Secondary | ICD-10-CM | POA: Diagnosis not present

## 2020-10-30 LAB — COMPREHENSIVE METABOLIC PANEL
ALT: 20 U/L (ref 0–35)
AST: 21 U/L (ref 0–37)
Albumin: 3.7 g/dL (ref 3.5–5.2)
Alkaline Phosphatase: 80 U/L (ref 39–117)
BUN: 17 mg/dL (ref 6–23)
CO2: 30 mEq/L (ref 19–32)
Calcium: 8.9 mg/dL (ref 8.4–10.5)
Chloride: 101 mEq/L (ref 96–112)
Creatinine, Ser: 0.8 mg/dL (ref 0.40–1.20)
GFR: 78.65 mL/min (ref >60.00)
Glucose, Bld: 76 mg/dL (ref 70–99)
Potassium: 3.7 mEq/L (ref 3.5–5.1)
Sodium: 139 mEq/L (ref 135–145)
Total Bilirubin: 0.3 mg/dL (ref 0.2–1.2)
Total Protein: 6.6 g/dL (ref 6.0–8.3)

## 2020-10-30 LAB — CBC WITH DIFFERENTIAL/PLATELET
Basophils Absolute: 0 10*3/uL (ref 0.0–0.1)
Basophils Relative: 0.6 % (ref 0.0–3.0)
Eosinophils Absolute: 0.3 10*3/uL (ref 0.0–0.7)
Eosinophils Relative: 3.5 % (ref 0.0–5.0)
HCT: 40.3 % (ref 36.0–46.0)
Hemoglobin: 13.1 g/dL (ref 12.0–15.0)
Lymphocytes Relative: 22.7 % (ref 12.0–46.0)
Lymphs Abs: 1.8 10*3/uL (ref 0.7–4.0)
MCHC: 32.4 g/dL (ref 30.0–36.0)
MCV: 87.3 fl (ref 78.0–100.0)
Monocytes Absolute: 0.8 10*3/uL (ref 0.1–1.0)
Monocytes Relative: 9.7 % (ref 3.0–12.0)
Neutro Abs: 5 10*3/uL (ref 1.4–7.7)
Neutrophils Relative %: 63.5 % (ref 43.0–77.0)
Platelets: 248 10*3/uL (ref 150.0–400.0)
RBC: 4.62 Mil/uL (ref 3.87–5.11)
RDW: 15.8 % — ABNORMAL HIGH (ref 11.5–15.5)
WBC: 7.8 10*3/uL (ref 4.0–10.5)

## 2020-10-30 LAB — HEMOGLOBIN A1C: Hgb A1c MFr Bld: 6.9 % — ABNORMAL HIGH (ref 4.6–6.5)

## 2020-10-30 LAB — TSH: TSH: 0.59 u[IU]/mL (ref 0.35–5.50)

## 2020-10-30 MED ORDER — PROPRANOLOL HCL 20 MG PO TABS: 20.0000 mg | ORAL_TABLET | Freq: Two times a day (BID) | ORAL | 1 refills | Status: DC

## 2020-10-30 NOTE — Assessment & Plan Note (Addendum)
Patient has history of panic disorder and now having more frequent panic attacks. Patient is already taking clonazepam at bedtime and duloxetine 60 mg daily. May benefit from low-dose beta-blocker propranolol 20 mg twice a day. Needs to follow-up with her psychiatrist as soon as possible.

## 2020-10-30 NOTE — Patient Instructions (Signed)

## 2020-10-30 NOTE — Assessment & Plan Note (Signed)
Well-controlled hypertension.  Continue amlodipine-valsartan 5-160 mg daily.  Dietary approaches to stop hypertension discussed.

## 2020-10-30 NOTE — Progress Notes (Signed)
Rachel Luna 65 y.o.   Chief Complaint  Patient presents with   Panic Attack    Pt states that her neurologist advises her to wear a heart montior, and pt may need medication for panic attacks    HISTORY OF PRESENT ILLNESS: This is a 65 y.o. female complaining of increased frequency of "panic attacks" Describes episodes as: Starts feeling really hot and vision starts "doing funny things" with "halos of light", starts getting weak and shaky, "legs do not hold me up", sweating, occasional nausea and heart racing with near syncopal episodes. Lab Results  Component Value Date   HGBA1C 6.1 (A) 07/16/2020   BP Readings from Last 3 Encounters:  10/30/20 140/82  10/27/20 (!) 142/80  07/16/20 138/78    Recently saw neurologist Dr. Jannifer Franklin.  Assessment and plan as follows: Assessment/Plan:   1.  Restless leg syndrome   2.  Obesity   3.  Right shoulder discomfort, possible rotator cuff tear   4.  Episodes of syncope and near syncope   5.  History of seizures, well controlled   The patient reports new issues with syncope or near syncope associated with shortness of breath, diaphoresis, and increased heart rate.  I have indicated that the patient should contact her primary care physician regarding these events.  I will set the patient up for a 30-day cardiac monitor study, she claims that she has had 5 or 6 such episodes within the last 6 weeks.  She may need full cardiac evaluation with 2D echocardiogram and EKG.  The patient was given a prescription for her Mirapex, she will follow-up here in 6 months, in the future she can be followed through Dr. Brett Fairy.   Jill Alexanders MD 10/27/2020 10:20 AM   [facility] [address] [place], [place] [id number]  Most recent psychiatric assessment and plan: Assessment and Plan: Rachel Luna is a 65 year old Caucasian female, divorced, disabled, lives in [place], has a history of bipolar disorder, anxiety  disorder, insomnia, seizure disorder, neuropathy, chronic pain, morbid obesity was evaluated by telemedicine today.  Patient is currently struggling with anxiety, concentration problems.  Patient however declines any medication changes and believes her problems are due to ADD.  Discussed plan as noted below.   Plan Bipolar disorder-unstable Cymbalta 90 mg p.o. daily Tegretol 100 mg p.o. daily-prescribed by neurology for seizures. Patient does report attention and focus problems, as well as anxiety, unknown if this is due to her bipolar symptoms.  Patient however declines any medication changes.   Anxiety disorder-unstable Continue CBT Continue current medications. Patient declines any medication changes.   Insomnia-stable Continue melatonin as needed   Attention and concentration problems-will order the following labs.  Patient had thyroid abnormalities a year ago.  Will order thyroid panel.  We will also order vitamin B12.  After reviewing her labs will make a decision whether she will need neuropsychology testing referral.   Writer attempted to reassure patient,  attempted to educate patient about her current symptoms as well as the need for further medication changes.  Patient however accused Probation officer of trying to make changes with her medications and treating her like a [place] pig.  Patient also believes staying on benzodiazepines is the right treatment for her and does not want to make any changes.  Writer discussed with patient about referral back to primary care provider or other providers in the community if patient believes writer is unable to address her problems.  Patient however at that  calmed down and reported that she was willing to get the labs done and also get the testing done.   Also discussed with patient that Probation officer and her therapist Ms. Deavon Podgorski Dibble has been coordinating care. Contacted Ms. Tifanny Dollens Dibble and discussed concerns, patient's need for education about making  medication changes, staying compliant.   Follow-up in clinic as needed.   I have spent atleast 30 minutes non face to face with patient today. More than 50 % of the time was spent for preparing to see the patient ( e.g., review of test, records ),ordering medications and test ,psychoeducation and supportive psychotherapy and care coordination,as well as documenting clinical information in electronic health record.   Ursula Alert, MD 01/03/2020, 1:55 PM HPI   Prior to Admission medications   Medication Sig Start Date End Date Taking? Authorizing Provider  ACCU-CHEK AVIVA PLUS test strip Use in the morning and after meals as needed. 07/13/18  Yes Kamil Hanigan, Ines Bloomer, MD  baclofen (LIORESAL) 10 MG tablet One tablet twice during the day and 2 at night 10/15/20  Yes Kathrynn Ducking, MD  carbamazepine (TEGRETOL) 200 MG tablet Take 0.5 tablets (100 mg total) by mouth daily. 08/05/20  Yes Kathrynn Ducking, MD  cetirizine (ZYRTEC) 10 MG tablet Take 10 mg by mouth daily.   Yes [provider]  clonazePAM (KLONOPIN) 0.5 MG tablet Take 1 tablet (0.5 mg total) by mouth at bedtime. 09/29/20  Yes Kathrynn Ducking, MD  DULoxetine (CYMBALTA) 60 MG capsule TAKE ONE CAPSULE BY MOUTH ONCE DAILY TAKE WITH 30 MG CAPSULE 08/11/20  Yes Krystall Kruckenberg, Ines Bloomer, MD  estradiol (ESTRACE) 0.5 MG tablet Take 1 tablet (0.5 mg total) by mouth daily. 12/26/18  Yes Shelly Bombard, MD  FARXIGA 5 MG TABS tablet Take 1 tablet (5 mg total) by mouth every morning. 03/17/20  Yes Sevyn Paredez, Ines Bloomer, MD  gabapentin (NEURONTIN) 800 MG tablet TAKE ONE TABLET BY MOUTH THREE TIMES DAILY 09/27/20  Yes Haisley Arens, Ines Bloomer, MD  glimepiride (AMARYL) 4 MG tablet TAKE ONE TABLET BY MOUTH EVERY MORNING WITH BREAKFAST 03/17/20  Yes Lolamae Voisin, Ines Bloomer, MD  meloxicam (MOBIC) 15 MG tablet Take 15 mg by mouth daily. 07/04/15  Yes [provider]  omeprazole (PRILOSEC) 20 MG capsule Take 20 mg by mouth as needed (heartburn).    Yes [provider]  pramipexole (MIRAPEX) 0.75 MG tablet One tablet in the morning, 3 at night 10/27/20  Yes Kathrynn Ducking, MD  sitaGLIPtin (JANUVIA) 100 MG tablet Take 1 tablet (100 mg total) by mouth daily. 03/17/20  Yes Brewster Wolters, Ines Bloomer, MD  amLODipine-valsartan (EXFORGE) 5-160 MG tablet Take 1 tablet by mouth daily. 03/17/20 06/15/20  Horald Pollen, MD  rosuvastatin (CRESTOR) 40 MG tablet Take 1 tablet (40 mg total) by mouth daily. 07/16/20 10/14/20  Horald Pollen, MD    Allergies  Allergen Reactions   Belsomra [Suvorexant] Other (See Comments)    Vivid dreams   Wellbutrin [Bupropion] Other (See Comments)    Irritability and Agitation   Geodon [Ziprasidone Hcl] Other (See Comments)    Worsened Restless Leg Syndrome   Lisinopril Cough   Neupro [Rotigotine] Rash    Patient Active Problem List   Diagnosis Date Noted   Syncope and collapse 10/27/2020   Benzodiazepine dependence in remission ([place]) 03/17/2020   Morbid obesity ([place]) 03/17/2020   Adrenal mass ([place]) 03/17/2020   PAD (peripheral artery disease) ([place]) 02/18/2020   Chronic venous insufficiency 02/18/2020   Hyperlipidemia 02/18/2020  Bipolar disorder, in full remission, most recent episode mixed ([place]) 01/03/2020   Attention deficit 01/03/2020   Aortic atherosclerosis ([place]) 11/20/2019   Restless leg syndrome 02/15/2019   Bipolar I disorder, most recent episode mixed ([place]) 09/14/2018   GAD (generalized anxiety disorder) 09/14/2018   Insomnia due to mental disorder 09/14/2018   Diabetes ([place]) 07/13/2018   Seizure disorder ([place]) 10/19/2017   Osteoarthritis of ankle and foot 03/27/2015   ANA positive 03/05/2015   Elevated rheumatoid factor 03/05/2015   Hypertension associated with diabetes ([place]) 09/04/2014   Avitaminosis D 09/04/2014   Chronic low back pain 02/18/2014   Menopausal hot flushes 08/14/2012   Arthritis of knee, degenerative 04/06/2011   Benign essential HTN 07/13/2010    Essential (primary) hypertension 07/13/2010   Insomnia, unspecified 07/13/2010   Impaired fasting glucose 07/13/2010   Chronic pain 01/08/2010   Affective disorder, major 11/05/2009   Major depressive disorder 11/05/2009   Major depressive disorder, single episode, unspecified 11/05/2009    Past Medical History:  Diagnosis Date   Anxiety    Arthritis    Bipolar disorder ([place])    chronic low back pain    Chronic low back pain 02/18/2014   Depression    Diabetes mellitus without complication (HCC)    Diabetes mellitus, type II ([place])    Fibroids    GERD (gastroesophageal reflux disease)    Gout    Hyperlipidemia    Hypertension    Obesity    Syncope and collapse 10/27/2020    Past Surgical History:  Procedure Laterality Date   ABDOMINAL HYSTERECTOMY     CHOLECYSTECTOMY     FOOT SURGERY Left    KNEE ARTHROPLASTY     OVARIAN CYST REMOVAL     SPINE SURGERY      Social History   Socioeconomic History   Marital status: Divorced    Spouse name: Not on file   Number of children: 0   Years of education: 10   Highest education level: 10th grade  Occupational History    Employer: UNEMPLOYED  Tobacco Use   Smoking status: Passive Smoke Exposure - Never Smoker   Smokeless tobacco: Never   Tobacco comments:    room mate smokes  Vaping Use   Vaping Use: Never used  Substance and Sexual Activity   Alcohol use: No    Alcohol/week: 0.0 standard drinks   Drug use: No   Sexual activity: Not Currently  Other Topics Concern   Not on file  Social History Narrative   Patient is divorced and lives with a roommate.   Patient is right-handed.   Patient drinks 6 sodas daily.         Social Determinants of Health   Financial Resource Strain: Not on file  Food Insecurity: No Food Insecurity   Worried About Charity fundraiser in the Last Year: Never true   Ran Out of Food in the Last Year: Never true  Transportation Needs: No Transportation Needs   Lack of Transportation  (Medical): No   Lack of Transportation (Non-Medical): No  Physical Activity: Not on file  Stress: Not on file  Social Connections: Not on file  Intimate Partner Violence: Not on file    Family History  Problem Relation Age of Onset   Diabetes Mother    Cancer Mother        breast   Heart disease Mother    Mental illness Mother        bipolar disorder   Stroke  Mother    Breast cancer Mother    Hypertension Mother    High Cholesterol Mother    Diabetes Father    Hypertension Father    Diabetes Brother    Bipolar disorder Brother    Diabetes Brother    Breast cancer Maternal Aunt    Breast cancer Maternal Grandmother      Review of Systems  Constitutional: Negative.  Negative for chills and fever.  HENT: Negative.  Negative for congestion and sore throat.   Respiratory: Negative.  Negative for cough and shortness of breath.   Cardiovascular:  Positive for palpitations. Negative for chest pain.  Gastrointestinal:  Positive for nausea. Negative for vomiting.  Genitourinary: Negative.   Skin: Negative.  Negative for rash.  Neurological:  Positive for headaches.       Near syncope episodes  All other systems reviewed and are negative.  Today's Vitals   10/30/20 0911  BP: 140/82  Pulse: 73  Temp: 97.8 F (36.6 C)  TempSrc: Oral  SpO2: 98%  Weight: 258 lb (117 kg)  Height: '5\' 4"'$  (1.626 m)   Body mass index is 44.29 kg/m.  Physical Exam Vitals reviewed.  Constitutional:      Appearance: Normal appearance. She is obese.  HENT:     Head: Normocephalic.  Eyes:     Extraocular Movements: Extraocular movements intact.     Conjunctiva/sclera: Conjunctivae normal.     Pupils: Pupils are equal, round, and reactive to light.  Cardiovascular:     Rate and Rhythm: Normal rate and regular rhythm.     Pulses: Normal pulses.     Heart sounds: Normal heart sounds.  Pulmonary:     Effort: Pulmonary effort is normal.     Breath sounds: Normal breath sounds.  Abdominal:      Palpations: Abdomen is soft.     Tenderness: There is no abdominal tenderness.  Musculoskeletal:        General: Normal range of motion.     Cervical back: Normal range of motion and neck supple.     Right lower leg: No edema.     Left lower leg: No edema.  Skin:    General: Skin is warm and dry.     Capillary Refill: Capillary refill takes less than 2 seconds.  Neurological:     General: No focal deficit present.     Mental Status: She is alert and oriented to person, place, and time.  Psychiatric:        Mood and Affect: Mood normal.        Behavior: Behavior normal.    EKG: Normal sinus rhythm with right bundle branch block and ventricular rate of 59/min  ASSESSMENT & PLAN: Rachel Luna was seen today for panic attack.  Diagnoses and all orders for this visit:  Palpitations -     EKG 12-Lead -     Ambulatory referral to Cardiology -     TSH  Panic attacks -     propranolol (INDERAL) 20 MG tablet; Take 1 tablet (20 mg total) by mouth 2 (two) times daily.  Hypertension associated with diabetes ([place]) -     CBC with Differential/Platelet -     Comprehensive metabolic panel -     TSH -     Hemoglobin A1c  Panic disorder  Essential (primary) hypertension Well-controlled hypertension.  Continue amlodipine-valsartan 5-160 mg daily.  Dietary approaches to stop hypertension discussed.  Palpitations Palpitations with near syncopal episodes.  Needs cardiac monitor.  Needs cardiology  evaluation.  Referral placed today.  Panic disorder Patient has history of panic disorder and now having more frequent panic attacks. Patient is already taking clonazepam at bedtime and duloxetine 60 mg daily. May benefit from low-dose beta-blocker propranolol 20 mg twice a day. Needs to follow-up with her psychiatrist as soon as possible.  Patient Instructions  [email or web address]  Generalized Anxiety Disorder, Adult Generalized anxiety disorder (GAD) is a mental health condition. Unlike  normal worries, anxiety related to GAD is not triggered by a specific event. These worries do not fade or get better with time. GAD interferes with relationships,work, and school. GAD symptoms can vary from mild to severe. People with severe GAD can have intense waves of anxiety with physical symptoms that are similar to panicattacks. What are the causes? The exact cause of GAD is not known, but the following are believed to have an impact: Differences in natural brain chemicals. Genes passed down from parents to children. Differences in the way threats are perceived. Development during childhood. Personality. What increases the risk? The following factors may make you more likely to develop this condition: Being female. Having a family history of anxiety disorders. Being very shy. Experiencing very stressful life events, such as the death of a loved one. Having a very stressful family environment. What are the signs or symptoms? People with GAD often worry excessively about many things in their lives, such as their health and family. Symptoms may also include: Mental and emotional symptoms: Worrying excessively about natural disasters. Fear of being late. Difficulty concentrating. Fears that others are judging your performance. Physical symptoms: Fatigue. Headaches, muscle tension, muscle twitches, trembling, or feeling shaky. Feeling like your heart is pounding or beating very fast. Feeling out of breath or like you cannot take a deep breath. Having trouble falling asleep or staying asleep, or experiencing restlessness. Sweating. Nausea, diarrhea, or irritable bowel syndrome (IBS). Behavioral symptoms: Experiencing erratic moods or irritability. Avoidance of new situations. Avoidance of people. Extreme difficulty making decisions. How is this diagnosed? This condition is diagnosed based on your symptoms and medical history. You will also have a physical exam. Your health care  provider may perform tests torule out other possible causes of your symptoms. To be diagnosed with GAD, a person must have anxiety that: Is out of his or her control. Affects several different aspects of his or her life, such as work and relationships. Causes distress that makes him or her unable to take part in normal activities. Includes at least three symptoms of GAD, such as restlessness, fatigue, trouble concentrating, irritability, muscle tension, or sleep problems. Before your health care provider can confirm a diagnosis of GAD, these symptoms must be present more days than they are not, and they must last for 6 months orlonger. How is this treated? This condition may be treated with: Medicine. Antidepressant medicine is usually prescribed for long-term daily control. Anti-anxiety medicines may be added in severe cases, especially when panic attacks occur. Talk therapy (psychotherapy). Certain types of talk therapy can be helpful in treating GAD by providing support, education, and guidance. Options include: Cognitive behavioral therapy (CBT). People learn coping skills and self-calming techniques to ease their physical symptoms. They learn to identify unrealistic thoughts and behaviors and to replace them with more appropriate thoughts and behaviors. Acceptance and commitment therapy (ACT). This treatment teaches people how to be mindful as a way to cope with unwanted thoughts and feelings. Biofeedback. This process trains you to manage your body's response (physiological response) through breathing  techniques and relaxation methods. You will work with a therapist while machines are used to monitor your physical symptoms. Stress management techniques. These include yoga, meditation, and exercise. A mental health specialist can help determine which treatment is best for you. Some people see improvement with one type of therapy. However, other peoplerequire a combination of therapies. Follow  these instructions at home: Lifestyle Maintain a consistent routine and schedule. Anticipate stressful situations. Create a plan, and allow extra time to work with your plan. Practice stress management or self-calming techniques that you have learned from your therapist or your health care provider. General instructions Take over-the-counter and prescription medicines only as told by your health care provider. Understand that you are likely to have setbacks. Accept this and be kind to yourself as you persist to take better care of yourself. Recognize and accept your accomplishments, even if you judge them as small. Keep all follow-up visits as told by your health care provider. This is important. Contact a health care provider if: Your symptoms do not get better. Your symptoms get worse. You have signs of depression, such as: A persistently sad or irritable mood. Loss of enjoyment in activities that used to bring you joy. Change in weight or eating. Changes in sleeping habits. Avoiding friends or family members. Loss of energy for normal tasks. Feelings of guilt or worthlessness. Get help right away if: You have serious thoughts about hurting yourself or others. If you ever feel like you may hurt yourself or others, or have thoughts about taking your own life, get help right away. Go to your nearest emergency department or: Call your local emergency services (911 in the [place] Call a suicide crisis helpline, such as the [place] at (786)720-8210. This is open 24 hours a day in the [place] Text the Crisis Text Line at 985-804-2296 (in the [place].). Summary Generalized anxiety disorder (GAD) is a mental health condition that involves worry that is not triggered by a specific event. People with GAD often worry excessively about many things in their lives, such as their health and family. GAD may cause symptoms such as restlessness, trouble concentrating, sleep problems,  frequent sweating, nausea, diarrhea, headaches, and trembling or muscle twitching. A mental health specialist can help determine which treatment is best for you. Some people see improvement with one type of therapy. However, other people require a combination of therapies. This information is not intended to replace advice given to you by your health care provider. Make sure you discuss any questions you have with your healthcare provider. Document Revised: 12/13/2018 Document Reviewed: 12/13/2018 Elsevier Patient Education  2022 [place], MD [facility]

## 2020-10-30 NOTE — Assessment & Plan Note (Signed)
Palpitations with near syncopal episodes.  Needs cardiac monitor.  Needs cardiology evaluation.  Referral placed today.

## 2020-11-10 ENCOUNTER — Ambulatory Visit (INDEPENDENT_AMBULATORY_CARE_PROVIDER_SITE_OTHER): Payer: Medicare Other

## 2020-11-10 DIAGNOSIS — R000 Tachycardia, unspecified: Secondary | ICD-10-CM | POA: Diagnosis not present

## 2020-11-10 DIAGNOSIS — R55 Syncope and collapse: Secondary | ICD-10-CM | POA: Diagnosis not present

## 2020-11-12 ENCOUNTER — Ambulatory Visit: Payer: Medicare Other

## 2020-11-17 ENCOUNTER — Other Ambulatory Visit: Payer: Self-pay

## 2020-11-17 ENCOUNTER — Ambulatory Visit
Admission: RE | Admit: 2020-11-17 | Discharge: 2020-11-17 | Disposition: A | Discharge status: Home / Self Care | Payer: Medicare Other | Source: Ambulatory Visit | Attending: Emergency Medicine | Admitting: Emergency Medicine

## 2020-11-17 DIAGNOSIS — Z1231 Encounter for screening mammogram for malignant neoplasm of breast: Secondary | ICD-10-CM

## 2020-11-26 ENCOUNTER — Ambulatory Visit: Payer: Medicare Other

## 2020-12-08 ENCOUNTER — Other Ambulatory Visit: Payer: Self-pay | Admitting: Emergency Medicine

## 2020-12-08 ENCOUNTER — Telehealth: Payer: Self-pay

## 2020-12-08 ENCOUNTER — Encounter: Payer: Self-pay | Admitting: Emergency Medicine

## 2020-12-08 ENCOUNTER — Other Ambulatory Visit: Payer: Self-pay

## 2020-12-08 ENCOUNTER — Ambulatory Visit (INDEPENDENT_AMBULATORY_CARE_PROVIDER_SITE_OTHER): Payer: Medicare Other

## 2020-12-08 VITALS — BP 130/78 | HR 88 | Temp 98.2°F | Ht 64.0 in | Wt 260.6 lb

## 2020-12-08 DIAGNOSIS — Z0000 Encounter for general adult medical examination without abnormal findings: Secondary | ICD-10-CM

## 2020-12-08 NOTE — Patient Instructions (Addendum)
Rachel Luna , Thank you for taking time to come for your Medicare Wellness Visit. I appreciate your ongoing commitment to your health goals. Please review the following plan we discussed and let me know if I can assist you in the future.   Screening recommendations/referrals: Colonoscopy: declined Mammogram: 11/17/2020; due every 2 years Bone Density: declined Recommended yearly ophthalmology/optometry visit for glaucoma screening and checkup Recommended yearly dental visit for hygiene and checkup  Vaccinations: Influenza vaccine: declined Pneumococcal vaccine: 01/06/2010, 10/16/2018 Tdap vaccine: 08/23/2019; due every 10 years Shingles vaccine: got from [place]; need documentation  Covid-19: 11/09/2019, 12/13/2019  Advanced directives: Advance directive discussed with you today. Even though you declined this today please call our office should you change your mind and we can give you the proper paperwork for you to fill out.  Conditions/risks identified: Yes; Client understands the importance of follow-up with providers by attending scheduled visits and discussed goals to eat healthier, increase physical activity, exercise the brain, socialize more, get enough sleep and make time for laughter.  Next appointment: Please schedule your next Medicare Wellness Visit with your Nurse Health Advisor in 1 year by calling [phone].  Preventive Care 40-64 Years, Female Preventive care refers to lifestyle choices and visits with your health care provider that can promote health and wellness. What does preventive care include? A yearly physical exam. This is also called an annual well check. Dental exams once or twice a year. Routine eye exams. Ask your health care provider how often you should have your eyes checked. Personal lifestyle choices, including: Daily care of your teeth and gums. Regular physical activity. Eating a healthy diet. Avoiding tobacco and drug use. Limiting alcohol  use. Practicing safe sex. Taking low-dose aspirin daily starting at age 28. Taking vitamin and mineral supplements as recommended by your health care provider. What happens during an annual well check? The services and screenings done by your health care provider during your annual well check will depend on your age, overall health, lifestyle risk factors, and family history of disease. Counseling  Your health care provider may ask you questions about your: Alcohol use. Tobacco use. Drug use. Emotional well-being. Home and relationship well-being. Sexual activity. Eating habits. Work and work Statistician. Method of birth control. Menstrual cycle. Pregnancy history. Screening  You may have the following tests or measurements: Height, weight, and BMI. Blood pressure. Lipid and cholesterol levels. These may be checked every 5 years, or more frequently if you are over 57 years old. Skin check. Lung cancer screening. You may have this screening every year starting at age 48 if you have a 30-pack-year history of smoking and currently smoke or have quit within the past 15 years. Fecal occult blood test (FOBT) of the stool. You may have this test every year starting at age 5. Flexible sigmoidoscopy or colonoscopy. You may have a sigmoidoscopy every 5 years or a colonoscopy every 10 years starting at age 67. Hepatitis C blood test. Hepatitis B blood test. Sexually transmitted disease (STD) testing. Diabetes screening. This is done by checking your blood sugar (glucose) after you have not eaten for a while (fasting). You may have this done every 1-3 years. Mammogram. This may be done every 1-2 years. Talk to your health care provider about when you should start having regular mammograms. This may depend on whether you have a family history of breast cancer. BRCA-related cancer screening. This may be done if you have a family history of breast, ovarian, tubal, or peritoneal cancers. Pelvic  exam and Pap test. This may be done every 3 years starting at age 41. Starting at age 28, this may be done every 5 years if you have a Pap test in combination with an HPV test. Bone density scan. This is done to screen for osteoporosis. You may have this scan if you are at high risk for osteoporosis. Discuss your test results, treatment options, and if necessary, the need for more tests with your health care provider. Vaccines  Your health care provider may recommend certain vaccines, such as: Influenza vaccine. This is recommended every year. Tetanus, diphtheria, and acellular pertussis (Tdap, Td) vaccine. You may need a Td booster every 10 years. Zoster vaccine. You may need this after age 59. Pneumococcal 13-valent conjugate (PCV13) vaccine. You may need this if you have certain conditions and were not previously vaccinated. Pneumococcal polysaccharide (PPSV23) vaccine. You may need one or two doses if you smoke cigarettes or if you have certain conditions. Talk to your health care provider about which screenings and vaccines you need and how often you need them. This information is not intended to replace advice given to you by your health care provider. Make sure you discuss any questions you have with your health care provider. Document Released: 03/21/2015 Document Revised: 11/12/2015 Document Reviewed: 12/24/2014 Elsevier Interactive Patient Education  2017 [employer] Prevention in the Home Falls can cause injuries. They can happen to people of all ages. There are many things you can do to make your home safe and to help prevent falls. What can I do on the outside of my home? Regularly fix the edges of walkways and driveways and fix any cracks. Remove anything that might make you trip as you walk through a door, such as a raised step or threshold. Trim any bushes or trees on the path to your home. Use bright outdoor lighting. Clear any walking paths of anything that might  make someone trip, such as rocks or tools. Regularly check to see if handrails are loose or broken. Make sure that both sides of any steps have handrails. Any raised decks and porches should have guardrails on the edges. Have any leaves, snow, or ice cleared regularly. Use sand or salt on walking paths during winter. Clean up any spills in your garage right away. This includes oil or grease spills. What can I do in the bathroom? Use night lights. Install grab bars by the toilet and in the tub and shower. Do not use towel bars as grab bars. Use non-skid mats or decals in the tub or shower. If you need to sit down in the shower, use a plastic, non-slip stool. Keep the floor dry. Clean up any water that spills on the floor as soon as it happens. Remove soap buildup in the tub or shower regularly. Attach bath mats securely with double-sided non-slip rug tape. Do not have throw rugs and other things on the floor that can make you trip. What can I do in the bedroom? Use night lights. Make sure that you have a light by your bed that is easy to reach. Do not use any sheets or blankets that are too big for your bed. They should not hang down onto the floor. Have a firm chair that has side arms. You can use this for support while you get dressed. Do not have throw rugs and other things on the floor that can make you trip. What can I do in the kitchen? Clean up  any spills right away. Avoid walking on wet floors. Keep items that you use a lot in easy-to-reach places. If you need to reach something above you, use a strong step stool that has a grab bar. Keep electrical cords out of the way. Do not use floor polish or wax that makes floors slippery. If you must use wax, use non-skid floor wax. Do not have throw rugs and other things on the floor that can make you trip. What can I do with my stairs? Do not leave any items on the stairs. Make sure that there are handrails on both sides of the stairs  and use them. Fix handrails that are broken or loose. Make sure that handrails are as long as the stairways. Check any carpeting to make sure that it is firmly attached to the stairs. Fix any carpet that is loose or worn. Avoid having throw rugs at the top or bottom of the stairs. If you do have throw rugs, attach them to the floor with carpet tape. Make sure that you have a light switch at the top of the stairs and the bottom of the stairs. If you do not have them, ask someone to add them for you. What else can I do to help prevent falls? Wear shoes that: Do not have high heels. Have rubber bottoms. Are comfortable and fit you well. Are closed at the toe. Do not wear sandals. If you use a stepladder: Make sure that it is fully opened. Do not climb a closed stepladder. Make sure that both sides of the stepladder are locked into place. Ask someone to hold it for you, if possible. Clearly mark and make sure that you can see: Any grab bars or handrails. First and last steps. Where the edge of each step is. Use tools that help you move around (mobility aids) if they are needed. These include: Canes. Walkers. Scooters. Crutches. Turn on the lights when you go into a dark area. Replace any light bulbs as soon as they burn out. Set up your furniture so you have a clear path. Avoid moving your furniture around. If any of your floors are uneven, fix them. If there are any pets around you, be aware of where they are. Review your medicines with your doctor. Some medicines can make you feel dizzy. This can increase your chance of falling. Ask your doctor what other things that you can do to help prevent falls. This information is not intended to replace advice given to you by your health care provider. Make sure you discuss any questions you have with your health care provider. Document Released: 12/19/2008 Document Revised: 07/31/2015 Document Reviewed: 03/29/2014 Elsevier Interactive Patient  Education  2017 [employer].

## 2020-12-08 NOTE — Telephone Encounter (Signed)
Called and spoke with pt,forms are ready for pick up.

## 2020-12-08 NOTE — Telephone Encounter (Signed)
Type of form received Jury Duty  Form placed in email to [email or web address]  Additional instructions from the patient Patient requesting form be completed by 12/09/2020  Patient notified of office policy for forms to be completed 7-10 business days.   Form placed in [place] box

## 2020-12-08 NOTE — Progress Notes (Signed)
Subjective:   Rachel Luna is a 65 y.o. female who presents for Medicare Annual (Subsequent) preventive examination.  Review of Systems     Cardiac Risk Factors include: diabetes mellitus;dyslipidemia;family history of premature cardiovascular disease;hypertension;sedentary lifestyle     Objective:    Today's Vitals   12/08/20 1022  BP: 130/78  Pulse: 88  Temp: 98.2 F (36.8 C)  SpO2: 90%  Weight: 260 lb 9.6 oz (118.2 kg)  Height: 5\' 4"  (1.626 m)  PainSc: 0-No pain   Body mass index is 44.73 kg/m.  Advanced Directives 12/08/2020 07/22/2020 11/13/2019 11/14/2017 11/12/2017 09/10/2017 12/19/2016  Does Patient Have a Medical Advance Directive? No No No No No No No  Type of Advance Directive - - - - - - -  Would patient like information on creating a medical advance directive? No - Patient declined No - Patient declined No - Patient declined No - Patient declined - No - Patient declined -  Some encounter information is confidential and restricted. Go to Review Flowsheets activity to see all data.    Current Medications (verified) Outpatient Encounter Medications as of 12/08/2020  Medication Sig   ACCU-CHEK AVIVA PLUS test strip Use in the morning and after meals as needed.   amLODipine-valsartan (EXFORGE) 5-160 MG tablet Take 1 tablet by mouth daily.   baclofen (LIORESAL) 10 MG tablet One tablet twice during the day and 2 at night   carbamazepine (TEGRETOL) 200 MG tablet Take 0.5 tablets (100 mg total) by mouth daily.   cetirizine (ZYRTEC) 10 MG tablet Take 10 mg by mouth daily.   clonazePAM (KLONOPIN) 0.5 MG tablet Take 1 tablet (0.5 mg total) by mouth at bedtime.   DULoxetine (CYMBALTA) 60 MG capsule TAKE ONE CAPSULE BY MOUTH ONCE DAILY TAKE WITH 30 MG CAPSULE   estradiol (ESTRACE) 0.5 MG tablet Take 1 tablet (0.5 mg total) by mouth daily.   FARXIGA 5 MG TABS tablet Take 1 tablet (5 mg total) by mouth every morning.   gabapentin (NEURONTIN) 800 MG tablet TAKE ONE TABLET BY MOUTH  THREE TIMES DAILY   glimepiride (AMARYL) 4 MG tablet TAKE ONE TABLET BY MOUTH EVERY MORNING WITH BREAKFAST   meloxicam (MOBIC) 15 MG tablet Take 15 mg by mouth daily.   omeprazole (PRILOSEC) 20 MG capsule Take 20 mg by mouth as needed (heartburn).   pramipexole (MIRAPEX) 0.75 MG tablet One tablet in the morning, 3 at night   propranolol (INDERAL) 20 MG tablet Take 1 tablet (20 mg total) by mouth 2 (two) times daily.   rosuvastatin (CRESTOR) 40 MG tablet Take 1 tablet (40 mg total) by mouth daily.   sitaGLIPtin (JANUVIA) 100 MG tablet Take 1 tablet (100 mg total) by mouth daily.   No facility-administered encounter medications on file as of 12/08/2020.    Allergies (verified) Belsomra [suvorexant], Wellbutrin [bupropion], Geodon [ziprasidone hcl], Lisinopril, and Neupro [rotigotine]   History: Past Medical History:  Diagnosis Date   Anxiety    Arthritis    Bipolar disorder ([place])    chronic low back pain    Chronic low back pain 02/18/2014   Depression    Diabetes mellitus without complication (H[facility])    Diabetes mellitus, type II ([place])    Fibroids    GERD (gastroesophageal reflux disease)    Gout    Hyperlipidemia    Hypertension    Obesity    Syncope and collapse 10/27/2020   Past Surgical History:  Procedure Laterality Date   ABDOMINAL HYSTERECTOMY  CHOLECYSTECTOMY     FOOT SURGERY Left    KNEE ARTHROPLASTY     OVARIAN CYST REMOVAL     SPINE SURGERY     Family History  Problem Relation Age of Onset   Diabetes Mother    Cancer Mother        breast   Heart disease Mother    Mental illness Mother        bipolar disorder   Stroke Mother    Breast cancer Mother    Hypertension Mother    High Cholesterol Mother    Diabetes Father    Hypertension Father    Diabetes Brother    Bipolar disorder Brother    Diabetes Brother    Breast cancer Maternal Aunt    Breast cancer Maternal Grandmother    Social History   Socioeconomic History   Marital status: Divorced     Spouse name: Not on file   Number of children: 0   Years of education: 10   Highest education level: 10th grade  Occupational History    Employer: UNEMPLOYED  Tobacco Use   Smoking status: Passive Smoke Exposure - Never Smoker   Smokeless tobacco: Never   Tobacco comments:    room mate smokes  Vaping Use   Vaping Use: Never used  Substance and Sexual Activity   Alcohol use: No    Alcohol/week: 0.0 standard drinks   Drug use: No   Sexual activity: Not Currently  Other Topics Concern   Not on file  Social History Narrative   Patient is divorced and lives with a roommate.   Patient is right-handed.   Patient drinks 6 sodas daily.         Social Determinants of Health   Financial Resource Strain: Low Risk    Difficulty of Paying Living Expenses: Not hard at all  Food Insecurity: No Food Insecurity   Worried About Charity fundraiser in the Last Year: Never true   [place] in the Last Year: Never true  Transportation Needs: No Transportation Needs   Lack of Transportation (Medical): No   Lack of Transportation (Non-Medical): No  Physical Activity: Inactive   Days of Exercise per Week: 0 days   Minutes of Exercise per Session: 0 min  Stress: Stress Concern Present   Feeling of Stress : Very much  Social Connections: Socially Isolated   Frequency of Communication with Friends and Family: More than three times a week   Frequency of Social Gatherings with Friends and Family: More than three times a week   Attends Religious Services: Never   Marine scientist or Organizations: No   Attends Music therapist: Never   Marital Status: Divorced    Tobacco Counseling Counseling given: Not Answered Tobacco comments: room mate smokes   Clinical Intake:  Pre-visit preparation completed: Yes  Pain : No/denies pain Pain Score: 0-No pain     BMI - recorded: 44.73 Nutritional Risks: None Diabetes: Yes CBG done?: No Did pt. bring in CBG monitor  from home?: No  How often do you need to have someone help you when you read instructions, pamphlets, or other written materials from your doctor or pharmacy?: 1 - Never What is the last grade level you completed in school?: 10th grade  Diabetic? yes  Interpreter Needed?: No  Information entered by :: Lisette Abu, LPN   Activities of Daily Living In your present state of health, do you have any difficulty performing the  following activities: 12/08/2020  Hearing? N  Vision? N  Difficulty concentrating or making decisions? Y  Walking or climbing stairs? N  Dressing or bathing? N  Doing errands, shopping? N  Preparing Food and eating ? N  Using the Toilet? N  In the past six months, have you accidently leaked urine? N  Do you have problems with loss of bowel control? N  Managing your Medications? N  Managing your Finances? N  Housekeeping or managing your Housekeeping? N  Some recent data might be hidden    Patient Care Team: Horald Pollen, MD as PCP - General (Internal Medicine) Ursula Alert, MD as Consulting Physician (Psychiatry) [place], Heather, [place] (Optometry) Knox Royalty, RN as Case Manager  Indicate any recent Medical Services you may have received from other than [facility] providers in the past year (date may be approximate).     Assessment:   This is a routine wellness examination for [place].  Hearing/Vision screen Hearing Screening - Comments:: Patient denied any hearing difficulty.   No hearing aids.  Vision Screening - Comments:: Patient wears corrective glasses/contacts.  Eye exam done annually by: Heather [place], OD.  Dietary issues and exercise activities discussed: Current Exercise Habits: The patient does not participate in regular exercise at present, Exercise limited by: psychological condition(s)   Goals Addressed   None   Depression Screen PHQ 2/9 Scores 12/08/2020 07/22/2020 07/16/2020 03/17/2020 01/23/2020 12/11/2019 11/20/2019  PHQ - 2  Score 2 1 0 3 0 0 0  PHQ- 9 Score - - 5 19 - - -    Fall Risk Fall Risk  12/08/2020 07/22/2020 03/17/2020 01/23/2020 12/11/2019  Falls in the past year? 0 0 1 0 1  Number falls in past yr: 0 0 1 0 0  Injury with Fall? 0 0 0 0 0  Comment - N/A- no falls reported - - -  Risk for fall due to : No Fall Risks Medication side effect - - -  Follow up Falls evaluation completed Falls prevention discussed Falls evaluation completed Falls evaluation completed Falls evaluation completed    [place]:  Any stairs in or around the home? No  If so, are there any without handrails? No  Home free of loose throw rugs in walkways, pet beds, electrical cords, etc? Yes  Adequate lighting in your home to reduce risk of falls? Yes   ASSISTIVE DEVICES UTILIZED TO PREVENT FALLS:  Life alert? No  Use of a cane, walker or w/c? No  Grab bars in the bathroom? No  Shower chair or bench in shower? No  Elevated toilet seat or a handicapped toilet? No   TIMED UP AND GO:  Was the test performed? Yes .  Length of time to ambulate 10 feet: 6 sec.   Gait steady and fast without use of assistive device  Cognitive Function: Normal cognitive status assessed by direct observation by this Nurse Health Advisor. No abnormalities found.          Immunizations Immunization History  Administered Date(s) Administered   Influenza,inj,Quad PF,6+ Mos 11/26/2016, 12/21/2017, 11/07/2019   Influenza-Unspecified 10/08/2015, 11/06/2018   Moderna Sars-Covid-2 Vaccination 11/09/2019, 12/13/2019   Pneumococcal Polysaccharide-23 10/16/2018   Pneumococcal-Unspecified 01/06/2010   Tdap 08/22/2009    TDAP status: Due, Education has been provided regarding the importance of this vaccine. Advised may receive this vaccine at local pharmacy or Health Dept. Aware to provide a copy of the vaccination record if obtained from local pharmacy or Health  Dept. Verbalized acceptance and understanding.  Flu  Vaccine status: Declined, Education has been provided regarding the importance of this vaccine but patient still declined. Advised may receive this vaccine at local pharmacy or Health Dept. Aware to provide a copy of the vaccination record if obtained from local pharmacy or Health Dept. Verbalized acceptance and understanding.  Pneumococcal vaccine status: Up to date  Covid-19 vaccine status: Completed vaccines  Qualifies for Shingles Vaccine? Yes   Zostavax completed No   Shingrix Completed?: No.    Education has been provided regarding the importance of this vaccine. Patient has been advised to call insurance company to determine out of pocket expense if they have not yet received this vaccine. Advised may also receive vaccine at local pharmacy or Health Dept. Verbalized acceptance and understanding.  Screening Tests Health Maintenance  Topic Date Due   URINE MICROALBUMIN  Never done   HIV Screening  Never done   Hepatitis C Screening  Never done   Fecal DNA (Cologuard)  Never done   Zoster Vaccines- Shingrix (1 of 2) Never done   COVID-19 Vaccine (3 - Booster for Moderna series) 05/12/2020   PAP SMEAR-Modifier  08/06/2020   INFLUENZA VACCINE  10/06/2020   FOOT EXAM  12/10/2020   OPHTHALMOLOGY EXAM  02/04/2021   HEMOGLOBIN A1C  05/02/2021   MAMMOGRAM  11/18/2022   TETANUS/TDAP  08/07/2023   HPV VACCINES  Aged Out    Health Maintenance  Health Maintenance Due  Topic Date Due   URINE MICROALBUMIN  Never done   HIV Screening  Never done   Hepatitis C Screening  Never done   Fecal DNA (Cologuard)  Never done   Zoster Vaccines- Shingrix (1 of 2) Never done   COVID-19 Vaccine (3 - Booster for Moderna series) 05/12/2020   PAP SMEAR-Modifier  08/06/2020   INFLUENZA VACCINE  10/06/2020    Colorectal cancer screening: No longer required.  (Patient declined procedure)  Mammogram status: Completed 11/17/2020. Repeat every year  Bone Density scan: never done  Lung Cancer  Screening: (Low Dose CT Chest recommended if Age 37-80 years, 30 pack-year currently smoking OR have quit w/in 15years.) does not qualify.   Lung Cancer Screening Referral: no  Additional Screening:  Hepatitis C Screening: does qualify; Completed yes  Vision Screening: Recommended annual ophthalmology exams for early detection of glaucoma and other disorders of the eye. Is the patient up to date with their annual eye exam?  Yes  Who is the provider or what is the name of the office in which the patient attends annual eye exams? Dr. Heather [place] If pt is not established with a provider, would they like to be referred to a provider to establish care? No .   Dental Screening: Recommended annual dental exams for proper oral hygiene  Community Resource Referral / Chronic Care Management: CRR required this visit?  No   CCM required this visit?  No      Plan:     I have personally reviewed and noted the following in the patient's chart:   Medical and social history Use of alcohol, tobacco or illicit drugs  Current medications and supplements including opioid prescriptions.  Functional ability and status Nutritional status Physical activity Advanced directives List of other physicians Hospitalizations, surgeries, and ER visits in previous 12 months Vitals Screenings to include cognitive, depression, and falls Referrals and appointments  In addition, I have reviewed and discussed with patient certain preventive protocols, quality metrics, and best practice recommendations. A written  personalized care plan for preventive services as well as general preventive health recommendations were provided to patient.     Sheral Flow, LPN   [id number]   Nurse Notes:  Hearing Screening - Comments:: Patient denied any hearing difficulty.   No hearing aids.  Vision Screening - Comments:: Patient wears corrective glasses/contacts.  Eye exam done annually by: Heather [place],  OD.

## 2020-12-08 NOTE — Telephone Encounter (Signed)
Letter printed and signed.  The form she brought in has to be filled out and signed by her, not me.

## 2020-12-11 ENCOUNTER — Other Ambulatory Visit: Payer: Self-pay

## 2020-12-11 ENCOUNTER — Encounter: Payer: Self-pay | Admitting: Cardiology

## 2020-12-11 ENCOUNTER — Ambulatory Visit (INDEPENDENT_AMBULATORY_CARE_PROVIDER_SITE_OTHER): Payer: Medicare Other | Admitting: Cardiology

## 2020-12-11 VITALS — BP 144/66 | HR 73 | Ht 64.0 in | Wt 263.0 lb

## 2020-12-11 DIAGNOSIS — R002 Palpitations: Secondary | ICD-10-CM

## 2020-12-11 DIAGNOSIS — E7800 Pure hypercholesterolemia, unspecified: Secondary | ICD-10-CM | POA: Diagnosis not present

## 2020-12-11 DIAGNOSIS — I10 Essential (primary) hypertension: Secondary | ICD-10-CM

## 2020-12-11 DIAGNOSIS — R011 Cardiac murmur, unspecified: Secondary | ICD-10-CM

## 2020-12-11 DIAGNOSIS — R0602 Shortness of breath: Secondary | ICD-10-CM | POA: Diagnosis not present

## 2020-12-11 NOTE — Patient Instructions (Signed)
Medication Instructions:  Your physician recommends that you continue on your current medications as directed. Please refer to the Current Medication list given to you today.  *If you need a refill on your cardiac medications before your next appointment, please call your pharmacy*   Lab Work:  None ordered  If you have labs (blood work) drawn today and your tests are completely normal, you will receive your results only by: [place] (if you have MyChart) OR A paper copy in the mail If you have any lab test that is abnormal or we need to change your treatment, we will call you to review the results.   Testing/Procedures:  Your physician has requested that you have an echocardiogram. Echocardiography is a painless test that uses sound waves to create images of your heart. It provides your doctor with information about the size and shape of your heart and how well your heart's chambers and valves are working. This procedure takes approximately one hour. There are no restrictions for this procedure.   Follow-Up: At [facility], you and your health needs are our priority.  As part of our continuing mission to provide you with exceptional heart care, we have created designated Provider Care Teams.  These Care Teams include your primary Cardiologist (physician) and Advanced Practice Providers (APPs -  Physician Assistants and Nurse Practitioners) who all work together to provide you with the care you need, when you need it.  We recommend signing up for the patient portal called "MyChart".  Sign up information is provided on this After Visit Summary.  MyChart is used to connect with patients for Virtual Visits (Telemedicine).  Patients are able to view lab/test results, encounter notes, upcoming appointments, etc.  Non-urgent messages can be sent to your provider as well.   To learn more about what you can do with MyChart, go to [email or web address].    Your next appointment:   2  month(s)  The format for your next appointment:   In Person  Provider:   You may see Kate Sable, MD or one of the following Advanced Practice Providers on your designated Care Team:   Murray Hodgkins, NP Christell Faith, PA-C Marrianne Mood, PA-C Cadence Kathlen Mody, [place]   Other Instructions

## 2020-12-11 NOTE — Progress Notes (Signed)
Cardiology Office Note:    Date:  12/11/2020   ID:  Rachel, Luna 1955/09/24, MRN [id number]  PCP:  Horald Pollen, MD   [place] Providers Cardiologist:  Kate Sable, MD     Referring MD: Horald Pollen, *   Chief Complaint  Patient presents with   New Patient (Initial Visit)    Referred by PCP for Palpitations. Patient c.o SOB and Occ. Chest pain. Meds reviewed verbally with patient.      History of Present Illness:    Rachel Luna is a 65 y.o. female with a hx of hypertension, hyperlipidemia, diabetes, anxiety, who presents due to palpitations.  She states having symptoms of palpitations ongoing over the past 2 to 3 months.  Symptoms usually occur in the morning and she feels like" her heart is going to fall out of her chest".  Symptoms are not related with exertion, saw her neurologist about 6 weeks ago, Holter monitor x30 days was ordered which patient is currently wearing.  States having occasional shortness of breath with exertion.  Denies any history of heart attacks or heart failure.  Denies smoking.  Blood pressure and cholesterol adequately controlled on current medications  Past Medical History:  Diagnosis Date   Anxiety    Arthritis    Bipolar disorder ([place])    chronic low back pain    Chronic low back pain 02/18/2014   Depression    Diabetes mellitus without complication (HCC)    Diabetes mellitus, type II ([place])    Fibroids    GERD (gastroesophageal reflux disease)    Gout    Hyperlipidemia    Hypertension    Obesity    Syncope and collapse 10/27/2020    Past Surgical History:  Procedure Laterality Date   ABDOMINAL HYSTERECTOMY     CHOLECYSTECTOMY     FOOT SURGERY Left    KNEE ARTHROPLASTY     OVARIAN CYST REMOVAL     SPINE SURGERY      Current Medications: Current Meds  Medication Sig   ACCU-CHEK AVIVA PLUS test strip Use in the morning and after meals as needed.   amLODipine-valsartan (EXFORGE) 5-160 MG tablet  Take 1 tablet by mouth daily.   baclofen (LIORESAL) 10 MG tablet One tablet twice during the day and 2 at night   carbamazepine (TEGRETOL) 200 MG tablet Take 0.5 tablets (100 mg total) by mouth daily.   cetirizine (ZYRTEC) 10 MG tablet Take 10 mg by mouth daily.   clonazePAM (KLONOPIN) 0.5 MG tablet Take 1 tablet (0.5 mg total) by mouth at bedtime.   DULoxetine (CYMBALTA) 60 MG capsule TAKE ONE CAPSULE BY MOUTH ONCE DAILY TAKE WITH 30 MG CAPSULE   estradiol (ESTRACE) 0.5 MG tablet Take 1 tablet (0.5 mg total) by mouth daily.   FARXIGA 5 MG TABS tablet Take 1 tablet (5 mg total) by mouth every morning.   gabapentin (NEURONTIN) 800 MG tablet TAKE ONE TABLET BY MOUTH THREE TIMES DAILY   glimepiride (AMARYL) 4 MG tablet TAKE ONE TABLET BY MOUTH EVERY MORNING WITH BREAKFAST   meloxicam (MOBIC) 15 MG tablet Take 15 mg by mouth daily.   omeprazole (PRILOSEC) 20 MG capsule Take 20 mg by mouth as needed (heartburn).   pramipexole (MIRAPEX) 0.75 MG tablet One tablet in the morning, 3 at night   propranolol (INDERAL) 20 MG tablet Take 1 tablet (20 mg total) by mouth 2 (two) times daily.   rosuvastatin (CRESTOR) 40 MG tablet Take 1 tablet (40  mg total) by mouth daily.   sitaGLIPtin (JANUVIA) 100 MG tablet Take 1 tablet (100 mg total) by mouth daily.     Allergies:   Belsomra [suvorexant], Wellbutrin [bupropion], Geodon [ziprasidone hcl], Lisinopril, and Neupro [rotigotine]   Social History   Socioeconomic History   Marital status: Divorced    Spouse name: Not on file   Number of children: 0   Years of education: 10   Highest education level: 10th grade  Occupational History    Employer: UNEMPLOYED  Tobacco Use   Smoking status: Passive Smoke Exposure - Never Smoker   Smokeless tobacco: Never   Tobacco comments:    room mate smokes  Vaping Use   Vaping Use: Never used  Substance and Sexual Activity   Alcohol use: No    Alcohol/week: 0.0 standard drinks   Drug use: No   Sexual activity:  Not Currently  Other Topics Concern   Not on file  Social History Narrative   Patient is divorced and lives with a roommate.   Patient is right-handed.   Patient drinks 6 sodas daily.         Social Determinants of Health   Financial Resource Strain: Low Risk    Difficulty of Paying Living Expenses: Not hard at all  Food Insecurity: No Food Insecurity   Worried About Charity fundraiser in the Last Year: Never true   [place] in the Last Year: Never true  Transportation Needs: No Transportation Needs   Lack of Transportation (Medical): No   Lack of Transportation (Non-Medical): No  Physical Activity: Inactive   Days of Exercise per Week: 0 days   Minutes of Exercise per Session: 0 min  Stress: Stress Concern Present   Feeling of Stress : Very much  Social Connections: Socially Isolated   Frequency of Communication with Friends and Family: More than three times a week   Frequency of Social Gatherings with Friends and Family: More than three times a week   Attends Religious Services: Never   Marine scientist or Organizations: No   Attends Music therapist: Never   Marital Status: Divorced     Family History: The patient's family history includes Bipolar disorder in her brother; Breast cancer in her maternal aunt, maternal grandmother, and mother; Cancer in her mother; Diabetes in her brother, brother, father, and mother; Heart disease in her mother; High Cholesterol in her mother; Hypertension in her father and mother; Mental illness in her mother; Stroke in her mother.  ROS:   Please see the history of present illness.     All other systems reviewed and are negative.  EKGs/Labs/Other Studies Reviewed:    The following studies were reviewed today:   EKG:  EKG is  ordered today.  The ekg ordered today demonstrates normal sinus rhythm, right bundle branch block, possible old inferior infarct  Recent Labs: 10/30/2020: ALT 20; BUN 17; Creatinine,  Ser 0.80; Hemoglobin 13.1; Platelets 248.0; Potassium 3.7; Sodium 139; TSH 0.59  Recent Lipid Panel    Component Value Date/Time   CHOL 160 03/17/2020 1037   CHOL 220 (H) 03/25/2014 0811   TRIG 106 03/17/2020 1037   TRIG 204 (H) 03/25/2014 0811   HDL 70 03/17/2020 1037   HDL 75 (H) 03/25/2014 0811   CHOLHDL 2.3 03/17/2020 1037   VLDL 41 (H) 03/25/2014 0811   LDLCALC 71 03/17/2020 1037   LDLCALC 104 (H) 03/25/2014 0811     Risk Assessment/Calculations:  Physical Exam:    VS:  BP (!) 144/66 (BP Location: Left Arm, Patient Position: Sitting, Cuff Size: Large)   Pulse 73   Ht 5\' 4"  (1.626 m)   Wt 263 lb (119.3 kg)   SpO2 95%   BMI 45.14 kg/m     Wt Readings from Last 3 Encounters:  12/11/20 263 lb (119.3 kg)  12/08/20 260 lb 9.6 oz (118.2 kg)  10/30/20 258 lb (117 kg)     GEN:  Well nourished, well developed in no acute distress HEENT: Normal NECK: No JVD; No carotid bruits LYMPHATICS: No lymphadenopathy CARDIAC: RRR, 2/6 systolic murmur RESPIRATORY:  Clear to auscultation without rales, wheezing or rhonchi  ABDOMEN: Soft, non-tender, non-distended MUSCULOSKELETAL:  No edema; No deformity  SKIN: Warm and dry NEUROLOGIC:  Alert and oriented x 3 PSYCHIATRIC:  Normal affect   ASSESSMENT:    1. Palpitations   2. Primary hypertension   3. Pure hypercholesterolemia   4. Morbid obesity ([place])   5. SOB (shortness of breath)   6. Systolic murmur    PLAN:    In order of problems listed above:  Palpitations ongoing x2 to 3 months.  30-day Holter monitor placed by neurology.  We will request report upon completion.  Patient's symptoms likely attributed to anxiety, although significant arrhythmias cannot be ruled out at this point. Hypertension, BP elevated today, usually controlled.  Continue current BP meds Hyperlipidemia, cholesterol controlled. Morbid obesity, low-calorie diet, weight loss advised. Nonspecific shortness of breath, obesity could be  contributing.  Systolic murmur also noted on cardiac exam.  Get echocardiogram to evaluate any structural abnormalities.  Follow-up after echo, 30-day Holter monitor results.     Medication Adjustments/Labs and Tests Ordered: Current medicines are reviewed at length with the patient today.  Concerns regarding medicines are outlined above.  Orders Placed This Encounter  Procedures   EKG 12-Lead   ECHOCARDIOGRAM COMPLETE    No orders of the defined types were placed in this encounter.   Patient Instructions  Medication Instructions:  Your physician recommends that you continue on your current medications as directed. Please refer to the Current Medication list given to you today.  *If you need a refill on your cardiac medications before your next appointment, please call your pharmacy*   Lab Work:  None ordered  If you have labs (blood work) drawn today and your tests are completely normal, you will receive your results only by: [place] (if you have MyChart) OR A paper copy in the mail If you have any lab test that is abnormal or we need to change your treatment, we will call you to review the results.   Testing/Procedures:  Your physician has requested that you have an echocardiogram. Echocardiography is a painless test that uses sound waves to create images of your heart. It provides your doctor with information about the size and shape of your heart and how well your heart's chambers and valves are working. This procedure takes approximately one hour. There are no restrictions for this procedure.   Follow-Up: At [facility], you and your health needs are our priority.  As part of our continuing mission to provide you with exceptional heart care, we have created designated Provider Care Teams.  These Care Teams include your primary Cardiologist (physician) and Advanced Practice Providers (APPs -  Physician Assistants and Nurse Practitioners) who all work together to  provide you with the care you need, when you need it.  We recommend signing up for the  patient portal called "MyChart".  Sign up information is provided on this After Visit Summary.  MyChart is used to connect with patients for Virtual Visits (Telemedicine).  Patients are able to view lab/test results, encounter notes, upcoming appointments, etc.  Non-urgent messages can be sent to your provider as well.   To learn more about what you can do with MyChart, go to [email or web address].    Your next appointment:   2 month(s)  The format for your next appointment:   In Person  Provider:   You may see Kate Sable, MD or one of the following Advanced Practice Providers on your designated Care Team:   Murray Hodgkins, NP Christell Faith, PA-C Marrianne Mood, PA-C Cadence Downey, [place]   Other Instructions    Signed, Kate Sable, MD  12/11/2020 12:44 PM    [place]

## 2020-12-16 ENCOUNTER — Other Ambulatory Visit: Payer: Self-pay | Admitting: Neurology

## 2020-12-16 DIAGNOSIS — R000 Tachycardia, unspecified: Secondary | ICD-10-CM

## 2020-12-16 DIAGNOSIS — R55 Syncope and collapse: Secondary | ICD-10-CM

## 2020-12-19 ENCOUNTER — Telehealth: Payer: Self-pay | Admitting: Neurology

## 2020-12-19 NOTE — Telephone Encounter (Signed)
I called the patient.  The cardiac monitor study was unremarkable.  The patient did have 2 mild episodes of increased heart rate and fluttering of the heart during the monitoring timeframe.  No cardiac abnormalities were noted.  The patient will be undergoing an echocardiogram in the near future.  The patient question whether these events could be panic attacks, it certainly is possible but the patient is on propranolol and she takes clonazepam in the evening, this should help anxiety events.   Cardiac monitor study 12/19/20:  Narrative & Impression Cardiac monitor reviewed, no significant arrhythmias noted.

## 2020-12-23 ENCOUNTER — Other Ambulatory Visit: Payer: Self-pay | Admitting: Obstetrics

## 2020-12-23 DIAGNOSIS — Z780 Asymptomatic menopausal state: Secondary | ICD-10-CM

## 2020-12-29 ENCOUNTER — Other Ambulatory Visit: Payer: Self-pay | Admitting: Obstetrics

## 2020-12-29 ENCOUNTER — Telehealth: Payer: Self-pay

## 2020-12-29 DIAGNOSIS — Z780 Asymptomatic menopausal state: Secondary | ICD-10-CM

## 2020-12-29 NOTE — Telephone Encounter (Signed)
Sent a staff message to Dr. Garen Lah alerting him to the available [employer] monitor report that was available. Received the following response:  Rachel Sable, MD  [name], RN Monitor reviewed, no significant arrhythmias noted.  Overall benign cardiac monitor.   Called patient and gave her the result note above. Patient also confirmed that she will be here for her Echocardiogram on 01/13/21.

## 2020-12-30 NOTE — Telephone Encounter (Signed)
Patient has not been seen in the office since 12/2018

## 2021-01-06 ENCOUNTER — Ambulatory Visit: Payer: Medicare Other | Admitting: Obstetrics

## 2021-01-13 ENCOUNTER — Other Ambulatory Visit: Payer: Medicare Other

## 2021-01-19 ENCOUNTER — Other Ambulatory Visit: Payer: Self-pay | Admitting: Neurology

## 2021-01-19 ENCOUNTER — Ambulatory Visit: Payer: Medicare Other | Admitting: Emergency Medicine

## 2021-01-20 ENCOUNTER — Ambulatory Visit (INDEPENDENT_AMBULATORY_CARE_PROVIDER_SITE_OTHER): Payer: Medicare Other | Admitting: *Deleted

## 2021-01-20 DIAGNOSIS — E119 Type 2 diabetes mellitus without complications: Secondary | ICD-10-CM

## 2021-01-20 DIAGNOSIS — F410 Panic disorder [episodic paroxysmal anxiety] without agoraphobia: Secondary | ICD-10-CM

## 2021-01-20 DIAGNOSIS — I10 Essential (primary) hypertension: Secondary | ICD-10-CM

## 2021-01-20 NOTE — Patient Instructions (Signed)
Visit Information   Rachel Luna, it was nice talking with you today.   I look forward to talking to you again for a telephone update on Tuesday, February 03, 2021 at 9:00 am- please be listening out for my call that day.  I will call as close to 9:00 am as possible.   If you need to cancel or re-schedule our telephone visit, please call [phone] and one of our care guides will be happy to assist you.   I look forward to hearing about your progress.   Please don't hesitate to contact me if I can be of assistance to you before our next scheduled telephone appointment.   Rachel Rack, RN, BSN, [facility] RN Care Coordination- [place] [phone]: direct office [phone]: mobile   Patient Self-Care Activities: [name]  will: Self administer medications as prescribed Attend all scheduled provider appointments Call pharmacy for medication refills Call provider office for new concerns or questions Continue actively engaging with your psychiatric counselor and [facility] Start checking and writing down on paper fasting (first thing in the morning, before eating) blood sugars at home Start occasionally checking your blood pressures at home- please write down your blood pressures when you check them Continue to follow heart healthy, low salt, low cholesterol, carbohydrate-modified, low sugar diet Review enclosed educational material   Managing Loss, Adult People experience loss in many different ways throughout their lives. Events such as moving, changing jobs, and losing friends can create a sense of loss. The loss may be as serious as a major health change, divorce, death of a pet, or death of a loved one. All of these types of loss are likely to create a physical and emotional reaction known as grief. Grief is the result of a major change or an absence of something or someone that you count on. Grief is a normal reaction to loss. A  variety of factors can affect your grieving experience, including: The nature of your loss. Your relationship to what or whom you lost. Your understanding of grief and how to manage it. Your support system. Be aware that when grief becomes extreme, it can lead to more severe issues like isolation, depression, anxiety, or suicidal thoughts. Talk with your health care provider if you have any of these issues. How to manage lifestyle changes Keep to your normal routine as much as possible. If you have trouble focusing or doing normal activities, it is acceptable to take some time away from your normal routine. Spend time with friends and loved ones. Eat a healthy diet, get plenty of sleep, and rest when you feel tired. How to recognize changes  The way that you deal with your grief will affect your ability to function as you normally do. When grieving, you may experience these changes: Numbness, shock, sadness, anxiety, anger, denial, and guilt. Thoughts about death. Unexpected crying. A physical sensation of emptiness in your stomach. Problems sleeping and eating. Tiredness (fatigue). Loss of interest in normal activities. Dreaming about or imagining seeing the person who died. A need to remember what or whom you lost. Difficulty thinking about anything other than your loss for a period of time. Relief. If you have been expecting the loss for a while, you may feel a sense of relief when it happens. Follow these instructions at home: Activity Express your feelings in healthy ways, such as: Talking with others about your loss. It may be helpful to find others who have  had a similar loss, such as a support group. Writing down your feelings in a journal. Doing physical activities to release stress and emotional energy. Doing creative activities like painting, sculpting, or playing or listening to music. Practicing resilience. This is the ability to recover and adjust after facing challenges.  Reading some resources that encourage resilience may help you to learn ways to practice those behaviors.  General instructions Be patient with yourself and others. Allow the grieving process to happen, and remember that grieving takes time. It is likely that you may never feel completely done with some grief. You may find a way to move on while still cherishing memories and feelings about your loss. Accepting your loss is a process. It can take months or longer to adjust. Keep all follow-up visits. This is important. Where to find support To get support for managing loss: Ask your health care provider for help and recommendations, such as grief counseling or therapy. Think about joining a support group for people who are managing a loss. Where to find more information You can find more information about managing loss from: American Society of Clinical Oncology: www.cancer.net American Psychological Association: TVStereos.ch Contact a health care provider if: Your grief is extreme and keeps getting worse. You have ongoing grief that does not improve. Your body shows symptoms of grief, such as illness. You feel depressed, anxious, or hopeless. Get help right away if: You have thoughts about hurting yourself or others. Get help right away if you feel like you may hurt yourself or others, or have thoughts about taking your own life. Go to your nearest emergency room or: Call 911. Call the [facility] at 2020066404 or 988. This is open 24 hours a day. Text the Crisis Text Line at 726-673-6543. Summary Grief is the result of a major change or an absence of someone or something that you count on. Grief is a normal reaction to loss. The depth of grief and the period of recovery depend on the type of loss and your ability to adjust to the change and process your feelings. Processing grief requires patience and a willingness to accept your feelings and talk about your loss  with people who are supportive. It is important to find resources that work for you and to realize that people experience grief differently. There is not one grieving process that works for everyone in the same way. Be aware that when grief becomes extreme, it can lead to more severe issues like isolation, depression, anxiety, or suicidal thoughts. Talk with your health care provider if you have any of these issues. This information is not intended to replace advice given to you by your health care provider. Make sure you discuss any questions you have with your health care provider. Document Revised: 10/13/2020 Document Reviewed: 10/13/2020 Elsevier Patient Education  2022 [employer].  The patient verbalized understanding of instructions, educational materials, and care plan provided today and agreed to receive a mailed copy of patient instructions, educational materials, and care plan Telephone follow up appointment with care management team member scheduled for:  Tuesday, February 03, 2021 at 9:00 am The patient has been provided with contact information for the care management team and has been advised to call with any health related questions or concerns  Rachel Rack, RN, BSN, [place] [phone]: direct office [phone]: mobile

## 2021-01-20 NOTE — Chronic Care Management (AMB) (Signed)
Chronic Care Management   CCM RN Visit Note  01/20/2021 Name: Rachel Luna MRN: [id number] DOB: June 04, 1955  Subjective: Rachel Luna is a 65 y.o. year old female who is a primary care patient of Mitchel Honour, Ines Bloomer, MD. The care management team was consulted for assistance with disease management and care coordination needs.    Engaged with patient by telephone for follow up visit in response to provider referral for case management and/or care coordination services.   Consent to Services:  The patient was given information about Chronic Care Management services, agreed to services, and gave verbal consent prior to initiation of services.  Please see initial visit note for detailed documentation.  Patient agreed to services and verbal consent obtained.   Assessment: Review of patient past medical history, allergies, medications, health status, including review of consultants reports, laboratory and other test data, was performed as part of comprehensive evaluation and provision of chronic care management services.    CCM Care Plan Allergies  Allergen Reactions   Belsomra [Suvorexant] Other (See Comments)    Vivid dreams   Wellbutrin [Bupropion] Other (See Comments)    Irritability and Agitation   Geodon [Ziprasidone Hcl] Other (See Comments)    Worsened Restless Leg Syndrome   Lisinopril Cough   Neupro [Rotigotine] Rash   Outpatient Encounter Medications as of 01/20/2021  Medication Sig   ACCU-CHEK AVIVA PLUS test strip Use in the morning and after meals as needed.   amLODipine-valsartan (EXFORGE) 5-160 MG tablet Take 1 tablet by mouth daily.   baclofen (LIORESAL) 10 MG tablet One tablet twice during the day and 2 at night   carbamazepine (TEGRETOL) 200 MG tablet Take 0.5 tablets (100 mg total) by mouth daily.   cetirizine (ZYRTEC) 10 MG tablet Take 10 mg by mouth daily.   clonazePAM (KLONOPIN) 0.5 MG tablet Take 1 tablet (0.5 mg total) by mouth at bedtime.   DULoxetine  (CYMBALTA) 60 MG capsule TAKE ONE CAPSULE BY MOUTH ONCE DAILY TAKE WITH 30 MG CAPSULE   estradiol (ESTRACE) 0.5 MG tablet Take 1 tablet (0.5 mg total) by mouth daily.   FARXIGA 5 MG TABS tablet Take 1 tablet (5 mg total) by mouth every morning.   gabapentin (NEURONTIN) 800 MG tablet TAKE ONE TABLET BY MOUTH THREE TIMES DAILY   glimepiride (AMARYL) 4 MG tablet TAKE ONE TABLET BY MOUTH EVERY MORNING WITH BREAKFAST   meloxicam (MOBIC) 15 MG tablet Take 15 mg by mouth daily.   omeprazole (PRILOSEC) 20 MG capsule Take 20 mg by mouth as needed (heartburn).   pramipexole (MIRAPEX) 0.75 MG tablet One tablet in the morning, 3 at night   propranolol (INDERAL) 20 MG tablet Take 1 tablet (20 mg total) by mouth 2 (two) times daily.   rosuvastatin (CRESTOR) 40 MG tablet Take 1 tablet (40 mg total) by mouth daily.   sitaGLIPtin (JANUVIA) 100 MG tablet Take 1 tablet (100 mg total) by mouth daily.   No facility-administered encounter medications on file as of 01/20/2021.   Patient Active Problem List   Diagnosis Date Noted   Palpitations 10/30/2020   Panic disorder 10/30/2020   Syncope and collapse 10/27/2020   Benzodiazepine dependence in remission ([place]) 03/17/2020   Morbid obesity ([place]) 03/17/2020   Adrenal mass ([place]) 03/17/2020   PAD (peripheral artery disease) ([place]) 02/18/2020   Chronic venous insufficiency 02/18/2020   Hyperlipidemia 02/18/2020   Bipolar disorder, in full remission, most recent episode mixed ([place]) 01/03/2020   Attention deficit 01/03/2020   Aortic  atherosclerosis ([place]) 11/20/2019   Restless leg syndrome 02/15/2019   Bipolar I disorder, most recent episode mixed ([place]) 09/14/2018   GAD (generalized anxiety disorder) 09/14/2018   Insomnia due to mental disorder 09/14/2018   Diabetes ([place]) 07/13/2018   Seizure disorder ([place]) 10/19/2017   Osteoarthritis of ankle and foot 03/27/2015   ANA positive 03/05/2015   Elevated rheumatoid factor 03/05/2015   Hypertension associated with  diabetes ([place]) 09/04/2014   Avitaminosis D 09/04/2014   Chronic low back pain 02/18/2014   Menopausal hot flushes 08/14/2012   Arthritis of knee, degenerative 04/06/2011   Benign essential HTN 07/13/2010   Essential (primary) hypertension 07/13/2010   Insomnia, unspecified 07/13/2010   Impaired fasting glucose 07/13/2010   Chronic pain 01/08/2010   Affective disorder, major 11/05/2009   Major depressive disorder 11/05/2009   Major depressive disorder, single episode, unspecified 11/05/2009   Conditions to be addressed/monitored:  HTN and DMII; grief and anxiety  Care Plan : Diabetes Type 2 (Adult)  Updates made by Knox Royalty, RN since 01/20/2021 12:00 AM     Problem: Disease Progression (Diabetes, Type 2)   Priority: Medium     Long-Range Goal: Disease Progression Prevented or Minimized   Start Date: 07/22/2020  Expected End Date: 01/22/2021  Recent Progress: On track  Priority: Medium  Note:   Objective:  Lab Results  Component Value Date   HGBA1C 6.1 (A) 07/16/2020   Lab Results  Component Value Date   CREATININE 0.48 (L) 04/21/2020   CREATININE 0.62 03/17/2020   CREATININE 0.64 12/11/2019   No results found for: EGFR Current Barriers:  Knowledge Deficits related to basic Diabetes pathophysiology and self care/management Does not use cbg meter- reports she continues to obtain supplies from pharmacy because they do not cost her anything, but does not check blood sugars at home due to not liking fingersticks  Unable to independently verbalize significance of A1-C values over time Case Manager Clinical Goal(s):  Over the next 6 months, patient will demonstrate improved adherence to prescribed treatment plan for diabetes self care/management as evidenced by patient reporting during CCM RN CM outreach of:  Maintenance of A1-C levels at or under goal of 6.5 Improved adherence to ADA/ carb modified diet  Adherence to prescribed medication regimen  Contacting provider  for new or worsened symptoms or questions Interventions:  Collaboration with Horald Pollen, MD regarding development and update of comprehensive plan of care as evidenced by provider attestation and co-signature Inter-disciplinary care team collaboration (see longitudinal plan of care) Chart reviewed including relevant office notes, upcoming scheduled appointments, and lab results  01/20/21- Goal completed due to duplication of goals, progression of care plan with conversion to new format; goals re-established and extended     Care Plan : Hypertension (Adult)  Updates made by Knox Royalty, RN since 01/20/2021 12:00 AM     Problem: Disease Progression (Hypertension)   Priority: Medium     Long-Range Goal: Disease Progression Prevented or Minimized   Start Date: 07/22/2020  Expected End Date: 01/22/2021  Recent Progress: On track  Priority: Medium  Note:   Objective:  Last practice recorded BP readings:  BP Readings from Last 3 Encounters:  07/16/20 138/78  04/21/20 140/75  03/17/20 133/79   Most recent eGFR/CrCl: No results found for: EGFR  No components found for: CRCL Current Barriers:  Knowledge Deficits related to basic understanding of hypertension pathophysiology and self care management Unable to independently verbalize weekly blood pressures at home: patient currently does not  monitor blood pressures at home Case Manager Clinical Goal(s):  Over the next 6 months, patient will demonstrate improved adherence to prescribed treatment plan for hypertension as evidenced by patient reporting during CCM RN CM outreach of: taking all medications as prescribed,  monitoring and recording daily blood pressures at home, review of same during CCM RN CM outreach adhering to low sodium/DASH diet Interventions:  Collaboration with Horald Pollen, MD regarding development and update of comprehensive plan of care as evidenced by provider attestation and  co-signature Inter-disciplinary care team collaboration (see longitudinal plan of care) UNABLE to independently: verbalize weekly blood pressure measurements at home Evaluation of current treatment plan related to hypertension self management and patient's adherence to plan as established by provider Chart reviewed including relevant office notes, upcoming scheduled appointments, and lab results  01/20/21- Goal completed due to duplication of goals, progression of care plan with conversion to new format; goals re-established and extended     Care Plan : [place] of Care  Updates made by Knox Royalty, RN since 01/20/2021 12:00 AM     Problem: Chronic Disease Management Needs   Priority: High     Long-Range Goal: Ongoing progression of plan of care for long term chronic disease management   Start Date: 01/20/2021  Expected End Date: 01/20/2022  Priority: High  Note:   Current Barriers:  Chronic Disease Management support and education needs related to HTN and DMII in setting of chronic depression/ anxiety 01/20/21- patient reports loss of her roommate/ friend of 40 years; reports remains active with psychiatric provider she is well established with; reports active with [facility] Patient consistently non-adherent to monitoring blood sugars/ blood pressures at home; lack of motivation in self-care  RNCM Clinical Goal(s):  Patient will demonstrate Improved health management independence HTN; DMII; anxiety/ depression  through collaboration with RN Care manager, provider, and care team.   Interventions: 1:1 collaboration with primary care provider regarding development and update of comprehensive plan of care as evidenced by provider attestation and co-signature Inter-disciplinary care team collaboration (see longitudinal plan of care) Evaluation of current treatment plan related to  self management and patient's adherence to plan as established  by provider Upon call initiation, patient immediately reports that she is very upset; her roommate has recently passed away (last week) while under [facility]osp[facility] care: I confirmed patient remains active with her established psychiatric counselor, and she adds that she is actively participating in [place] services as well; I attempted to complete full depression screening and to provide her with resource for urgent [place]: she declines both, states that she is "not suicidal and knows where the urgent [facility] clinic is located;" unfortunately, she abruptly ended our call due to another incoming call stating that she had to take the call-- she agreed to a follow up call in 2 weeks and was encouraged to continue her regular psychiatric provider and hospice Grief counseling services.  I encouraged patient to contact me sooner if needed, emotional support/ encouragement provided throughout today's call  Hypertension Interventions: Last practice recorded BP readings:  BP Readings from Last 3 Encounters:  12/11/20 (!) 144/66  12/08/20 130/78  10/30/20 138/80  Most recent eGFR/CrCl: No results found for: EGFR  No components found for: CRCL  Evaluation of current treatment plan related to hypertension self management and patient's adherence to plan as established by provider; Discussed plans with patient for ongoing care management follow up and provided patient with direct  contact information for care management team; Discussed complications of poorly controlled blood pressure such as heart disease, stroke, circulatory complications, vision complications, kidney impairment, sexual dysfunction;  Discussed with patient recent cardiology work up for palpitations; discussed results (benign) and confirmed patient plans to attend follow up cardiology visits as scheduled: patient believes recent palpitations are a result of the stress she has been under while her roommate was under hospice care- she  confirms that she remains active with her well-established psychiatry provider/ counselor and is also participating in [employer] program for grief counseling; states she has an appointment with both "tomorrow" and plans to attend as scheduled- this was encouraged Confirmed has still not been monitoring/ recording blood pressures at home: she remains lacking in motivation and states she does not plan to start BP monitoring at home Confirmed no medication concerns  Diabetes Interventions:  Counseled on importance of regular laboratory monitoring as prescribed; Reviewed scheduled/upcoming provider appointments including: 02/06/21- annual gynecology visit; 02/09/21- vascular provider with [facility]; 02/17/21- ECHOcardiogram; 02/19/21- cardiology provider; 03/05/21- PCP; Review of patient status, including review of consultants reports, relevant laboratory and other test results, and medications completed; Confirmed patient has still not been consistently monitoring/ recording blood sugars at home- not motivated; again encouraged patient to consider monitoring/ recording at home in the future   Lab Results  Component Value Date   HGBA1C 6.9 (H) 10/30/2020  Patient Goals/Self-Care Activities: As evidenced by review of EHR, collaboration with care team, and patient reporting during CCM RN CM outreach, Patient Tyla  will: Self administer medications as prescribed Attend all scheduled provider appointments Call pharmacy for medication refills Call provider office for new concerns or questions Continue actively engaging with your psychiatric counselor and [facility] Start checking and writing down on paper fasting (first thing in the morning, before eating) blood sugars at home Start occasionally checking your blood pressures at home- please write down your blood pressures when you check them Continue to follow heart healthy, low salt, low cholesterol, carbohydrate-modified, low sugar diet Review  enclosed educational material   Follow Up Plan:   Telephone follow up appointment with care management team member scheduled for:  Tuesday, February 03, 2021 at 9:00 am The patient has been provided with contact information for the care management team and has been advised to call with any health related questions or concerns      Plan: The patient has been provided with contact information for the care management team and has been advised to call with any health related questions or concerns   [name], RN, BSN, [place] [phone]: direct office [phone]: mobile

## 2021-02-03 ENCOUNTER — Other Ambulatory Visit: Payer: Self-pay | Admitting: Emergency Medicine

## 2021-02-03 ENCOUNTER — Ambulatory Visit: Payer: Medicare Other | Admitting: *Deleted

## 2021-02-03 DIAGNOSIS — E119 Type 2 diabetes mellitus without complications: Secondary | ICD-10-CM

## 2021-02-03 DIAGNOSIS — F411 Generalized anxiety disorder: Secondary | ICD-10-CM

## 2021-02-03 DIAGNOSIS — F3178 Bipolar disorder, in full remission, most recent episode mixed: Secondary | ICD-10-CM

## 2021-02-03 DIAGNOSIS — I10 Essential (primary) hypertension: Secondary | ICD-10-CM

## 2021-02-03 NOTE — Chronic Care Management (AMB) (Signed)
Chronic Care Management   CCM RN Visit Note  02/03/2021 Name: Rachel Luna MRN: [id number] DOB: June 06, 1955  Subjective: Rachel Luna is a 65 y.o. year old female who is a primary care patient of Mitchel Honour, Ines Bloomer, MD. The care management team was consulted for assistance with disease management and care coordination needs.    Engaged with patient by telephone for follow up visit in response to provider referral for case management and/or care coordination services.   Consent to Services:  The patient was given information about Chronic Care Management services, agreed to services, and gave verbal consent prior to initiation of services.  Please see initial visit note for detailed documentation.  Patient agreed to services and verbal consent obtained.   Assessment: Review of patient past medical history, allergies, medications, health status, including review of consultants reports, laboratory and other test data, was performed as part of comprehensive evaluation and provision of chronic care management services.   SDOH (Social Determinants of Health) assessments and interventions performed:  SDOH Interventions    Flowsheet Row Most Recent Value  SDOH Interventions   Food Insecurity Interventions Intervention Not Indicated  [Reports gets $245 + $95 food stamps,  plus $305 credits/month for food/ supplies from [employer] Interventions Intervention Not Indicated  Transportation Interventions Intervention Not Indicated  [Patient reports she is driving now]  Depression Interventions/Treatment  Currently on Treatment  [Patient established with counselor, "for long time"]      CCM Care Plan Allergies  Allergen Reactions   Belsomra [Suvorexant] Other (See Comments)    Vivid dreams   Wellbutrin [Bupropion] Other (See Comments)    Irritability and Agitation   Geodon [Ziprasidone Hcl] Other (See Comments)    Worsened Restless Leg Syndrome   Lisinopril Cough   Neupro  [Rotigotine] Rash   Outpatient Encounter Medications as of 02/03/2021  Medication Sig   clonazePAM (KLONOPIN) 0.5 MG tablet TAKE 1 TABLET BY MOUTH AT BEDTIME   ACCU-CHEK AVIVA PLUS test strip Use in the morning and after meals as needed.   amLODipine-valsartan (EXFORGE) 5-160 MG tablet Take 1 tablet by mouth daily.   baclofen (LIORESAL) 10 MG tablet One tablet twice during the day and 2 at night   carbamazepine (TEGRETOL) 200 MG tablet Take 0.5 tablets (100 mg total) by mouth daily.   cetirizine (ZYRTEC) 10 MG tablet Take 10 mg by mouth daily.   DULoxetine (CYMBALTA) 60 MG capsule TAKE ONE CAPSULE BY MOUTH ONCE DAILY TAKE WITH 30 MG CAPSULE   estradiol (ESTRACE) 0.5 MG tablet Take 1 tablet (0.5 mg total) by mouth daily.   FARXIGA 5 MG TABS tablet Take 1 tablet (5 mg total) by mouth every morning.   gabapentin (NEURONTIN) 800 MG tablet TAKE ONE TABLET BY MOUTH THREE TIMES DAILY   glimepiride (AMARYL) 4 MG tablet TAKE ONE TABLET BY MOUTH EVERY MORNING WITH BREAKFAST   meloxicam (MOBIC) 15 MG tablet Take 15 mg by mouth daily.   omeprazole (PRILOSEC) 20 MG capsule Take 20 mg by mouth as needed (heartburn).   pramipexole (MIRAPEX) 0.75 MG tablet One tablet in the morning, 3 at night   propranolol (INDERAL) 20 MG tablet Take 1 tablet (20 mg total) by mouth 2 (two) times daily.   rosuvastatin (CRESTOR) 40 MG tablet Take 1 tablet (40 mg total) by mouth daily.   sitaGLIPtin (JANUVIA) 100 MG tablet Take 1 tablet (100 mg total) by mouth daily.   No facility-administered encounter medications on file as of 02/03/2021.  Patient Active Problem List   Diagnosis Date Noted   Palpitations 10/30/2020   Panic disorder 10/30/2020   Syncope and collapse 10/27/2020   Benzodiazepine dependence in remission ([place]) 03/17/2020   Morbid obesity ([place]) 03/17/2020   Adrenal mass ([place]) 03/17/2020   PAD (peripheral artery disease) ([place]) 02/18/2020   Chronic venous insufficiency 02/18/2020   Hyperlipidemia  02/18/2020   Bipolar disorder, in full remission, most recent episode mixed ([place]) 01/03/2020   Attention deficit 01/03/2020   Aortic atherosclerosis ([place]) 11/20/2019   Restless leg syndrome 02/15/2019   Bipolar I disorder, most recent episode mixed ([place]) 09/14/2018   GAD (generalized anxiety disorder) 09/14/2018   Insomnia due to mental disorder 09/14/2018   Diabetes ([place]) 07/13/2018   Seizure disorder ([place]) 10/19/2017   Osteoarthritis of ankle and foot 03/27/2015   ANA positive 03/05/2015   Elevated rheumatoid factor 03/05/2015   Hypertension associated with diabetes ([place]) 09/04/2014   Avitaminosis D 09/04/2014   Chronic low back pain 02/18/2014   Menopausal hot flushes 08/14/2012   Arthritis of knee, degenerative 04/06/2011   Benign essential HTN 07/13/2010   Essential (primary) hypertension 07/13/2010   Insomnia, unspecified 07/13/2010   Impaired fasting glucose 07/13/2010   Chronic pain 01/08/2010   Affective disorder, major 11/05/2009   Major depressive disorder 11/05/2009   Major depressive disorder, single episode, unspecified 11/05/2009   Conditions to be addressed/monitored:  HTN, DMII, and Anxiety  Care Plan : RN Care Manager Plan of Care  Updates made by Knox Royalty, RN since 02/03/2021 12:00 AM     Problem: Chronic Disease Management Needs   Priority: High     Long-Range Goal: Ongoing progression of plan of care for long term chronic disease management   Start Date: 01/20/2021  Expected End Date: 01/20/2022  Priority: High  Note:   Current Barriers:  Chronic Disease Management support and education needs related to HTN and DMII in setting of chronic depression/ anxiety 02/03/21- patient reported 01/20/21 loss of her roommate/ friend of 24 years; reports remains active with psychiatric provider she is well established with: today, she reports she believes she is progressing through her grief well- depression screen updated Patient consistently non-adherent  to monitoring blood sugars/ blood pressures at home; lack of motivation in self-care  RNCM Clinical Goal(s):  Patient will demonstrate Improved health management independence HTN; DMII; anxiety/ depression  through collaboration with RN Care manager, provider, and care team.   Interventions: 1:1 collaboration with primary care provider regarding development and update of comprehensive plan of care as evidenced by provider attestation and co-signature Inter-disciplinary care team collaboration (see longitudinal plan of care) Evaluation of current treatment plan related to  self management and patient's adherence to plan as established by provider  Hypertension Interventions: Last practice recorded BP readings:  BP Readings from Last 3 Encounters:  12/11/20 (!) 144/66  12/08/20 130/78  10/30/20 138/80  Most recent eGFR/CrCl: No results found for: EGFR  No components found for: CRCL  Evaluation of current treatment plan related to hypertension self management and patient's adherence to plan as established by provider Discussed plans with patient for ongoing care management follow up and provided patient with direct contact information for care management team Discussed complications of poorly controlled blood pressure such as heart disease, stroke, circulatory complications, vision complications, kidney impairment, sexual dysfunction Discussed progress with grief management after the recent loss of her roommate who was under hospice care- she confirms that she remains active with her well-established psychiatry provider/ counselor with weekly  sessions; she has completed Hospice program for grief counseling; states she has an appointment with established psychiatric provider/ counselor this Friday and plans to attend as scheduled- this was encouraged Confirmed has still not been monitoring/ recording blood pressures at home: she remains lacking in motivation and states she does not plan to start BP  monitoring at home Confirmed no medication concerns- reports taking all as prescribed, denies questions about medications Reinforced previously provided education around importance of following prescribed diet: heart healthy, low salt, carbohydrate modified; low sugar  Diabetes Interventions:  Counseled on importance of regular laboratory monitoring as prescribed Reviewed scheduled/upcoming provider appointments including: 02/09/21- vascular provider with [facility]; 02/17/21- ECHOcardiogram; 02/19/21- cardiology provider; 03/05/21- PCP Review of patient status, including review of consultants reports, relevant laboratory and other test results, and medications completed Screening for signs and symptoms of depression related to chronic disease state  Assessed social determinant of health barriers Re- confirmed patient has still not been consistently monitoring/ recording blood sugars at home- not motivated; again encouraged patient to consider monitoring/ recording at home in the future  Discussed with patient value of monitoring blood sugars at home; discussed possibility that PCP would update A1-C at time of upcoming office visit; briefly reviewed historical trends of A1-C Confirmed no concerns with medications: reports taking all as prescribed  Lab Results  Component Value Date   HGBA1C 6.9 (H) 10/30/2020  Patient Goals/Self-Care Activities: As evidenced by review of EHR, collaboration with care team, and patient reporting during CCM RN CM outreach, Patient Toniyah  will: Self administer medications as prescribed Attend all scheduled provider appointments Call pharmacy for medication refills Call provider office for new concerns or questions Continue actively engaging with your psychiatric counselor to help you manage your grief after the loss of your roommate Start checking and writing down on paper fasting (first thing in the morning, before eating) blood sugars at home Start occasionally  checking your blood pressures at home- please write down your blood pressures when you check them Continue to follow heart healthy, low salt, low cholesterol, carbohydrate-modified, low sugar diet  Follow Up Plan:   Telephone follow up appointment with care management team member scheduled for:  Tuesday, March 24, 2020 at 9:00 am The patient has been provided with contact information for the care management team and has been advised to call with any health related questions or concerns      Plan: The patient has been provided with contact information for the care management team and has been advised to call with any health related questions or concerns  [name], RN, BSN, [place] [phone]: direct office [phone]: mobile

## 2021-02-04 DIAGNOSIS — E119 Type 2 diabetes mellitus without complications: Secondary | ICD-10-CM

## 2021-02-04 DIAGNOSIS — I10 Essential (primary) hypertension: Secondary | ICD-10-CM | POA: Diagnosis not present

## 2021-02-06 ENCOUNTER — Ambulatory Visit: Payer: Medicare Other | Admitting: Obstetrics

## 2021-02-09 ENCOUNTER — Ambulatory Visit (INDEPENDENT_AMBULATORY_CARE_PROVIDER_SITE_OTHER): Payer: Medicare Other | Admitting: Vascular Surgery

## 2021-02-09 ENCOUNTER — Encounter (INDEPENDENT_AMBULATORY_CARE_PROVIDER_SITE_OTHER): Payer: Self-pay

## 2021-02-09 ENCOUNTER — Encounter (INDEPENDENT_AMBULATORY_CARE_PROVIDER_SITE_OTHER): Payer: Medicare Other

## 2021-02-10 ENCOUNTER — Ambulatory Visit: Payer: Medicare Other | Admitting: Cardiology

## 2021-02-17 ENCOUNTER — Other Ambulatory Visit: Payer: Medicare Other

## 2021-02-19 ENCOUNTER — Ambulatory Visit: Payer: Medicare Other | Admitting: Cardiology

## 2021-03-05 ENCOUNTER — Ambulatory Visit: Payer: Medicare Other | Admitting: Emergency Medicine

## 2021-03-16 ENCOUNTER — Other Ambulatory Visit: Payer: Self-pay | Admitting: Emergency Medicine

## 2021-03-16 DIAGNOSIS — E1165 Type 2 diabetes mellitus with hyperglycemia: Secondary | ICD-10-CM

## 2021-03-24 ENCOUNTER — Ambulatory Visit (INDEPENDENT_AMBULATORY_CARE_PROVIDER_SITE_OTHER): Payer: Medicare Other | Admitting: *Deleted

## 2021-03-24 DIAGNOSIS — E119 Type 2 diabetes mellitus without complications: Secondary | ICD-10-CM

## 2021-03-24 DIAGNOSIS — I10 Essential (primary) hypertension: Secondary | ICD-10-CM

## 2021-03-24 NOTE — Patient Instructions (Signed)
Visit Information  Rachel Luna, thank you for taking time to talk with me today. Please don't hesitate to contact me if I can be of assistance to you before our next scheduled telephone appointment.  Below are the goals we discussed today:  Patient Self-Care Activities: [name]  will: Take medications as prescribed Attend all scheduled provider appointments Call pharmacy for medication refills Call provider office for new concerns or questions Continue actively engaging with your psychiatric counselor to help you manage your grief after the loss of your roommate Please consider checking and writing down on paper fasting (first thing in the morning, before eating) blood sugars at home Please consider occasionally checking your blood pressures at home- please write down your blood pressures when you check them Continue to follow heart healthy, low salt, low cholesterol, carbohydrate-modified, low sugar diet  Our next scheduled telephone follow up visit/ appointment with care management team member is scheduled on:  ,Thursday, May 28, 2021 at 9:00 am  If you need to cancel or re-schedule our visit, please call [phone] and our care guide team will be happy to assist you.   I look forward to hearing about your progress.   Rachel Rack, RN, BSN, [place] [phone]: direct office  If you are experiencing a Mental Health or [place] or need someone to talk to, please  call the Suicide and Crisis Lifeline: 988 call the [place] National Suicide Prevention Lifeline: 902-232-8936 or TTY: (367)682-5073 TTY (347)346-7208) to talk to a trained counselor call 1-800-273-TALK (toll free, 24 hour hotline) go to [facility] [place] [phone]) call 911   The patient verbalized understanding of instructions, educational materials, and care plan provided  today and declined offer to receive copy of patient instructions, educational materials, and care plan

## 2021-03-24 NOTE — Chronic Care Management (AMB) (Signed)
Chronic Care Management   CCM RN Visit Note  03/24/2021 Name: Rachel Luna MRN: [id number] DOB: 1956/03/03  Subjective: Rachel Luna is a 66 y.o. year old female who is a primary care patient of Mitchel Honour, Ines Bloomer, MD. The care management team was consulted for assistance with disease management and care coordination needs.    Engaged with patient by telephone for follow up visit in response to provider referral for case management and/or care coordination services.   Consent to Services:  The patient was given information about Chronic Care Management services, agreed to services, and gave verbal consent prior to initiation of services.  Please see initial visit note for detailed documentation.  Patient agreed to services and verbal consent obtained.   Assessment: Review of patient past medical history, allergies, medications, health status, including review of consultants reports, laboratory and other test data, was performed as part of comprehensive evaluation and provision of chronic care management services.   SDOH (Social Determinants of Health) assessments and interventions performed:  SDOH Interventions    Flowsheet Row Most Recent Value  SDOH Interventions   Transportation Interventions Intervention Not Indicated  [Reports currently actively getting vehicle updated with [employer],  confirms she is aware of insurance benefit for transportation- currently working with insurance provider to arrange transportation while car being registered with [employer]]       CCM Care Plan Allergies  Allergen Reactions   Belsomra [Suvorexant] Other (See Comments)    Vivid dreams   Wellbutrin [Bupropion] Other (See Comments)    Irritability and Agitation   Geodon [Ziprasidone Hcl] Other (See Comments)    Worsened Restless Leg Syndrome   Lisinopril Cough   Neupro [Rotigotine] Rash   Outpatient Encounter Medications as of 03/24/2021  Medication Sig   clonazePAM (KLONOPIN) 0.5 MG tablet TAKE 1  TABLET BY MOUTH AT BEDTIME   ACCU-CHEK AVIVA PLUS test strip Use in the morning and after meals as needed.   amLODipine-valsartan (EXFORGE) 5-160 MG tablet Take 1 tablet by mouth daily.   baclofen (LIORESAL) 10 MG tablet One tablet twice during the day and 2 at night   carbamazepine (TEGRETOL) 200 MG tablet Take 0.5 tablets (100 mg total) by mouth daily.   cetirizine (ZYRTEC) 10 MG tablet Take 10 mg by mouth daily.   DULoxetine (CYMBALTA) 30 MG capsule TAKE 1 CAPSULE DAILY TO BE COMBINED WITH $RemoveBefor'60MG'rdwTCokthZjH$  CAPSULE   DULoxetine (CYMBALTA) 60 MG capsule TAKE 1 CAPSULE BY MOUTH ONCE DAILY - TAKE WITH $RemoveB'30MG'VFdhAzyJ$  CAPSULE   estradiol (ESTRACE) 0.5 MG tablet Take 1 tablet (0.5 mg total) by mouth daily.   FARXIGA 5 MG TABS tablet Take 1 tablet (5 mg total) by mouth every morning.   gabapentin (NEURONTIN) 800 MG tablet TAKE ONE TABLET BY MOUTH THREE TIMES DAILY   glimepiride (AMARYL) 4 MG tablet Take 1 tablet (4 mg total) by mouth daily with breakfast. TAKE 1 TABLET EVERY MORNING WITH BREAKFAST   meloxicam (MOBIC) 15 MG tablet Take 15 mg by mouth daily.   omeprazole (PRILOSEC) 20 MG capsule Take 20 mg by mouth as needed (heartburn).   pramipexole (MIRAPEX) 0.75 MG tablet One tablet in the morning, 3 at night   propranolol (INDERAL) 20 MG tablet Take 1 tablet (20 mg total) by mouth 2 (two) times daily.   rosuvastatin (CRESTOR) 40 MG tablet Take 1 tablet (40 mg total) by mouth daily.   sitaGLIPtin (JANUVIA) 100 MG tablet Take 1 tablet (100 mg total) by mouth daily.   No facility-administered encounter  medications on file as of 03/24/2021.   Patient Active Problem List   Diagnosis Date Noted   Palpitations 10/30/2020   Panic disorder 10/30/2020   Syncope and collapse 10/27/2020   Benzodiazepine dependence in remission ([place]) 03/17/2020   Morbid obesity ([place]) 03/17/2020   Adrenal mass ([place]) 03/17/2020   PAD (peripheral artery disease) ([place]) 02/18/2020   Chronic venous insufficiency 02/18/2020   Hyperlipidemia  02/18/2020   Bipolar disorder, in full remission, most recent episode mixed ([place]) 01/03/2020   Attention deficit 01/03/2020   Aortic atherosclerosis ([place]) 11/20/2019   Restless leg syndrome 02/15/2019   Bipolar I disorder, most recent episode mixed ([place]) 09/14/2018   GAD (generalized anxiety disorder) 09/14/2018   Insomnia due to mental disorder 09/14/2018   Diabetes ([place]) 07/13/2018   Seizure disorder ([place]) 10/19/2017   Osteoarthritis of ankle and foot 03/27/2015   ANA positive 03/05/2015   Elevated rheumatoid factor 03/05/2015   Hypertension associated with diabetes ([place]) 09/04/2014   Avitaminosis D 09/04/2014   Chronic low back pain 02/18/2014   Menopausal hot flushes 08/14/2012   Arthritis of knee, degenerative 04/06/2011   Benign essential HTN 07/13/2010   Essential (primary) hypertension 07/13/2010   Insomnia, unspecified 07/13/2010   Impaired fasting glucose 07/13/2010   Chronic pain 01/08/2010   Affective disorder, major 11/05/2009   Major depressive disorder 11/05/2009   Major depressive disorder, single episode, unspecified 11/05/2009   Conditions to be addressed/monitored:  HTN and DMII  Care Plan : RN Care Manager Plan of Care  Updates made by Knox Royalty, RN since 03/24/2021 12:00 AM     Problem: Chronic Disease Management Needs   Priority: High     Long-Range Goal: Ongoing progression of plan of care for long term chronic disease management   Start Date: 01/20/2021  Expected End Date: 01/20/2022  Priority: High  Note:   Current Barriers:  Chronic Disease Management support and education needs related to HTN and DMII in setting of chronic depression/ anxiety 02/03/21- patient reported 01/20/21 loss of her roommate/ friend of 74 years; reports remains active with psychiatric provider she is well established with: today, she reports she believes she is progressing through her grief well- depression screen updated Patient consistently non-adherent to  monitoring blood sugars/ blood pressures at home; lack of motivation in self-care  RNCM Clinical Goal(s):  Patient will demonstrate Improved health management independence HTN; DMII; anxiety/ depression  through collaboration with RN Care manager, provider, and care team.   Interventions: 1:1 collaboration with primary care provider regarding development and update of comprehensive plan of care as evidenced by provider attestation and co-signature Inter-disciplinary care team collaboration (see longitudinal plan of care) Evaluation of current treatment plan related to  self management and patient's adherence to plan as established by provider Follow up outreach completed-- call today was supposed to review multiple scheduled provider appointments in December/ January: patient reports she had to reschedule all appointments, as her vehicle is not properly registered through [facility]-- states she has an appointment with [employer] on May 06, 2021; in the meantime, she has re-scheduled all appointments and is in contact with her insurance provider to use transportation benefit for appointments that are scheduled prior to May 06, 2021 when her car should be registered Denies clinical concerns, issues/ problems Continues being followed by established counseling provider- verbalizes "things getting better" around her grief from loss of her roommate Confirms she is taking medications as prescribed; denies medication concerns/ questions  Hypertension Interventions: Goal status: On Track; Long Term  goal Last practice recorded BP readings:  BP Readings from Last 3 Encounters:  12/11/20 (!) 144/66  12/08/20 130/78  10/30/20 138/80  Most recent eGFR/CrCl: No results found for: EGFR  No components found for: CRCL  Evaluation of current treatment plan related to hypertension self management and patient's adherence to plan as established by provider Discussed plans with patient for ongoing care management follow up and  provided patient with direct contact information for care management team Discussed complications of poorly controlled blood pressure such as heart disease, stroke, circulatory complications, vision complications, kidney impairment, sexual dysfunction Confirmed has still not been monitoring/ recording blood pressures at home: she remains lacking in motivation and states she does not plan to start BP monitoring at home, however, I again discussed benefit of monitoring at home, for her own knowledge/ empowerment Reinforced previously provided education around importance of following prescribed diet: heart healthy, low salt, carbohydrate modified; low sugar  Diabetes Interventions: Goal Status: On Track; Long Term goal  Counseled on importance of regular laboratory monitoring as prescribed Reviewed scheduled/upcoming provider appointments including: 03/30/21- ECHOcardiogram; 04/06/21- cardiology provider; 04/08/21- PCP Review of patient status, including review of consultants reports, relevant laboratory and other test results, and medications completed Re- confirmed patient has still not been consistently monitoring/ recording blood sugars at home- not motivated; again encouraged patient to consider monitoring/ recording at home in the future  Discussed with patient value of monitoring blood sugars at home; discussed possibility that PCP would update A1-C at time of upcoming office visit; briefly reviewed historical trends of A1-C  Lab Results  Component Value Date   HGBA1C 6.9 (H) 10/30/2020  Patient Goals/Self-Care Activities: As evidenced by review of EHR, collaboration with care team, and patient reporting during CCM RN CM outreach,  Patient [place]  will: Take medications as prescribed Attend all scheduled provider appointments Call pharmacy for medication refills Call provider office for new concerns or questions Continue actively engaging with your psychiatric counselor to help you manage  your grief after the loss of your roommate Please consider checking and writing down on paper fasting (first thing in the morning, before eating) blood sugars at home Please consider occasionally checking your blood pressures at home- please write down your blood pressures when you check them Continue to follow heart healthy, low salt, low cholesterol, carbohydrate-modified, low sugar diet  Follow Up Plan:   Telephone follow up appointment with care management team member scheduled for:  Thursday, May 28, 2021 at 9:00 am The patient has been provided with contact information for the care management team and has been advised to call with any health related questions or concerns       Plan: The patient has been provided with contact information for the care management team and has been advised to call with any health related questions or concerns  [name], RN, BSN, [place] [phone]: direct office

## 2021-03-30 ENCOUNTER — Other Ambulatory Visit: Payer: Medicare Other

## 2021-04-06 ENCOUNTER — Ambulatory Visit: Payer: Medicare Other | Admitting: Cardiology

## 2021-04-07 DIAGNOSIS — E119 Type 2 diabetes mellitus without complications: Secondary | ICD-10-CM | POA: Diagnosis not present

## 2021-04-07 DIAGNOSIS — I10 Essential (primary) hypertension: Secondary | ICD-10-CM

## 2021-04-08 ENCOUNTER — Ambulatory Visit: Payer: Medicare Other | Admitting: Emergency Medicine

## 2021-04-13 ENCOUNTER — Ambulatory Visit: Payer: Medicare Other | Admitting: Emergency Medicine

## 2021-04-13 ENCOUNTER — Other Ambulatory Visit: Payer: Self-pay | Admitting: Emergency Medicine

## 2021-04-13 DIAGNOSIS — E1159 Type 2 diabetes mellitus with other circulatory complications: Secondary | ICD-10-CM

## 2021-04-13 DIAGNOSIS — I152 Hypertension secondary to endocrine disorders: Secondary | ICD-10-CM

## 2021-04-15 ENCOUNTER — Other Ambulatory Visit: Payer: Self-pay | Admitting: Neurology

## 2021-04-15 NOTE — Telephone Encounter (Signed)
Pt is requesting a refill for baclofen (LIORESAL) 10 MG tablet,carbamazepine (TEGRETOL) 200 MG tablet & pramipexole (MIRAPEX) 0.75 MG tablet.  Pharmacy:  [facility]

## 2021-04-16 MED ORDER — BACLOFEN 10 MG PO TABS: ORAL_TABLET | ORAL | 0 refills | Status: DC

## 2021-04-16 MED ORDER — CARBAMAZEPINE 200 MG PO TABS
100.0000 mg | ORAL_TABLET | Freq: Every day | ORAL | 0 refills | Status: DC
Start: 2021-04-16 — End: 2021-04-29

## 2021-04-16 MED ORDER — PRAMIPEXOLE DIHYDROCHLORIDE 0.75 MG PO TABS: ORAL_TABLET | ORAL | 0 refills | Status: DC

## 2021-04-16 NOTE — Telephone Encounter (Signed)
Rx refilled pending review.

## 2021-04-20 ENCOUNTER — Other Ambulatory Visit: Payer: Medicare Other

## 2021-04-23 ENCOUNTER — Other Ambulatory Visit: Payer: Self-pay | Admitting: Emergency Medicine

## 2021-04-23 DIAGNOSIS — G2581 Restless legs syndrome: Secondary | ICD-10-CM

## 2021-04-23 DIAGNOSIS — E1165 Type 2 diabetes mellitus with hyperglycemia: Secondary | ICD-10-CM

## 2021-04-23 DIAGNOSIS — F410 Panic disorder [episodic paroxysmal anxiety] without agoraphobia: Secondary | ICD-10-CM

## 2021-04-28 NOTE — Progress Notes (Signed)
PATIENT: Rachel Luna DOB: 1955/10/12  REASON FOR VISIT: Follow up for RLS, no seizures  HISTORY FROM: Patient PRIMARY NEUROLOGIST: Dr. Brett Fairy   HISTORY OF PRESENT ILLNESS: Today 04/29/21 Rachel Luna here today for follow-up, on Mirapex, Klonopin for symptoms from our office. On gabapentin and cymbalta from PCP. Symptoms are well controlled once she takes her medication. Doesn't sleep much because of her cats. Walks around the house if legs are bothersome with good benefit. At last visit reported several week episode of sweats, syncope or near syncope.  Cardiac monitor was unremarkable, there were 2 episodes of mild increased heart rate and fluttering. She feels related to Januvia, once she stopped it, no further episodes. Her room mate passed, has panic attacks, wakes up screaming for him in the night. Is seeing therapist, things are getting better. Doesn't remember when her last seizure was, occasionally feels like she might have one, in the past seizures reported paralyzed body, couldn't talk with hallucinations of airplanes. I was able to see Ferritin was low Feb 2022 18, cannot tolerate iron supplement due to severe diarrhea, never tried infusion?. She gets the following from our office:  Baclofen 10 mg twice daily, 20 mg at bedtime  Carbamazepine 200 mg, 1/2 tablet daily for seizures  Mirapex 0.75, 1 in the morning, 3 at night  Klonopin 0.5 mg 1 tablet at bedtime  HISTORY 10/27/20 Dr. Jannifer Franklin: Rachel Luna is a 66 year old right-handed white female with a history of restless leg syndrome.  In the past, she has not been able to tolerate the Neupro patch, she has been on Mirapex taking the 0.75 mg tablets, she takes 1 in the morning and 3 in the evening.  With this regimen, she is sleeping fairly well.  She generally has more trouble with the right leg than the left with the restless legs.  She does have low back pain without radiation down to the legs.  Within the last several weeks she has  developed some right shoulder discomfort that occurs with trying to elevate the arm.  She sees Dr. French Ana from orthopedic surgery for her right knee, she will be seeing him in the near future.  The patient also reports a 6-week history of episodes of sweats and associated syncope or near syncope.  The patient indicates that she feels partially paralyzed with profuse sweating, she will have shortness of breath and feels as if her heart is "coming out of her chest".  She has not contacted her primary care physician regarding this.  She has not checked her blood sugar around the time of the event.  She oftentimes will have the events in the morning after eating breakfast and taking her medication.   REVIEW OF SYSTEMS: Out of a complete 14 system review of symptoms, the patient complains only of the following symptoms, and all other reviewed systems are negative.  See HPI  ALLERGIES: Allergies  Allergen Reactions   Belsomra [Suvorexant] Other (See Comments)    Vivid dreams   Wellbutrin [Bupropion] Other (See Comments)    Irritability and Agitation   Geodon [Ziprasidone Hcl] Other (See Comments)    Worsened Restless Leg Syndrome   Lisinopril Cough   Neupro [Rotigotine] Rash    HOME MEDICATIONS: Outpatient Medications Prior to Visit  Medication Sig Dispense Refill   ACCU-CHEK AVIVA PLUS test strip Use in the morning and after meals as needed. 100 each 5   baclofen (LIORESAL) 10 MG tablet One tablet twice during the day and 2  at night 360 each 0   carbamazepine (TEGRETOL) 200 MG tablet Take 0.5 tablets (100 mg total) by mouth daily. 45 tablet 0   cetirizine (ZYRTEC) 10 MG tablet Take 10 mg by mouth daily.     clonazePAM (KLONOPIN) 0.5 MG tablet TAKE 1 TABLET BY MOUTH AT BEDTIME 30 tablet 3   dapagliflozin propanediol (FARXIGA) 5 MG TABS tablet Take 1 tablet (5 mg total) by mouth every morning. 90 tablet 3   DULoxetine (CYMBALTA) 30 MG capsule TAKE 1 CAPSULE DAILY TO BE COMBINED WITH 60MG   CAPSULE 90 capsule 0   DULoxetine (CYMBALTA) 60 MG capsule TAKE 1 CAPSULE BY MOUTH ONCE DAILY - TAKE WITH 30MG  CAPSULE 90 capsule 0   estradiol (ESTRACE) 0.5 MG tablet Take 1 tablet (0.5 mg total) by mouth daily. 30 tablet 11   gabapentin (NEURONTIN) 800 MG tablet TAKE 1 TAKE THREE TIMES A DAY 270 tablet 0   glimepiride (AMARYL) 4 MG tablet Take 1 tablet (4 mg total) by mouth daily with breakfast. TAKE 1 TABLET EVERY MORNING WITH BREAKFAST 90 tablet 1   JANUVIA 100 MG tablet TAKE 1 TABLET BY MOUTH ONCE DAILY 90 tablet 0   meloxicam (MOBIC) 15 MG tablet Take 15 mg by mouth daily.     omeprazole (PRILOSEC) 20 MG capsule Take 20 mg by mouth as needed (heartburn).     pramipexole (MIRAPEX) 0.75 MG tablet One tablet in the morning, 3 at night 360 tablet 0   propranolol (INDERAL) 20 MG tablet Take 1 tablet (20 mg total) by mouth 2 (two) timesdaily. 180 tablet 0   amLODipine-valsartan (EXFORGE) 5-160 MG tablet Take 1 tablet by mouth daily. 90 tablet 3   rosuvastatin (CRESTOR) 40 MG tablet Take 1 tablet (40 mg total) by mouth daily. 90 tablet 3   No facility-administered medications prior to visit.    PAST MEDICAL HISTORY: Past Medical History:  Diagnosis Date   Anxiety    Arthritis    Bipolar disorder ([place])    chronic low back pain    Chronic low back pain 02/18/2014   Depression    Diabetes mellitus without complication (HCC)    Diabetes mellitus, type II ([place])    Fibroids    GERD (gastroesophageal reflux disease)    Gout    Hyperlipidemia    Hypertension    Obesity    Syncope and collapse 10/27/2020    PAST SURGICAL HISTORY: Past Surgical History:  Procedure Laterality Date   ABDOMINAL HYSTERECTOMY     CHOLECYSTECTOMY     FOOT SURGERY Left    KNEE ARTHROPLASTY     OVARIAN CYST REMOVAL     SPINE SURGERY      FAMILY HISTORY: Family History  Problem Relation Age of Onset   Diabetes Mother    Cancer Mother        breast   Heart disease Mother    Mental illness Mother         bipolar disorder   Stroke Mother    Breast cancer Mother    Hypertension Mother    High Cholesterol Mother    Diabetes Father    Hypertension Father    Diabetes Brother    Bipolar disorder Brother    Diabetes Brother    Breast cancer Maternal Aunt    Breast cancer Maternal Grandmother     SOCIAL HISTORY: Social History   Socioeconomic History   Marital status: Divorced    Spouse name: Not on file   Number of children: 0  Years of education: 10   Highest education level: 10th grade  Occupational History    Employer: UNEMPLOYED  Tobacco Use   Smoking status: Passive Smoke Exposure - Never Smoker   Smokeless tobacco: Never   Tobacco comments:    room mate smokes  Vaping Use   Vaping Use: Never used  Substance and Sexual Activity   Alcohol use: No    Alcohol/week: 0.0 Luna drinks   Drug use: No   Sexual activity: Not Currently  Other Topics Concern   Not on file  Social History Narrative   Patient is divorced and lives with a roommate.   Patient is right-handed.   Patient drinks 6 sodas daily.         Social Determinants of Health   Financial Resource Strain: Low Risk    Difficulty of Paying Living Expenses: Not hard at all  Food Insecurity: No Food Insecurity   Worried About Charity fundraiser in the Last Year: Never true   Arboriculturist in the Last Year: Never true  Transportation Needs: Unmet Transportation Needs   Lack of Transportation (Medical): Yes   Lack of Transportation (Non-Medical): No  Physical Activity: Inactive   Days of Exercise per Week: 0 days   Minutes of Exercise per Session: 0 min  Stress: Stress Concern Present   Feeling of Stress : Very much  Social Connections: Socially Isolated   Frequency of Communication with Friends and Family: More than three times a week   Frequency of Social Gatherings with Friends and Family: More than three times a week   Attends Religious Services: Never   Marine scientist or Organizations:  No   Attends Archivist Meetings: Never   Marital Status: Divorced  Human resources officer Violence: Not At Risk   Fear of Current or Ex-Partner: No   Emotionally Abused: No   Physically Abused: No   Sexually Abused: No   PHYSICAL EXAM  Vitals:   04/29/21 1010  BP: (!) 169/90  Pulse: 85  Weight: 263 lb (119.3 kg)  Height: 5\' 4"  (1.626 m)   Body mass index is 45.14 kg/m.  Generalized: Well developed, in no acute distress   Neurological examination  Mentation: Alert oriented to time, place, history taking. Follows all commands speech and language fluent Cranial nerve II-XII: Pupils were equal round reactive to light. Extraocular movements were full, visual field were full on confrontational test. Facial sensation and strength were normal. Head turning and shoulder shrug  were normal and symmetric. Motor: The motor testing reveals 5 over 5 strength of all 4 extremities. Good symmetric motor tone is noted throughout.  Sensory: Sensory testing is intact to soft touch on all 4 extremities. No evidence of extinction is noted.  Coordination: Cerebellar testing reveals good finger-nose-finger and heel-to-shin bilaterally.  Gait and station: Gait is normal Reflexes: Deep tendon reflexes are symmetric   DIAGNOSTIC DATA (LABS, IMAGING, TESTING) - I reviewed patient records, labs, notes, testing and imaging myself where available.  Lab Results  Component Value Date   WBC 7.8 10/30/2020   HGB 13.1 10/30/2020   HCT 40.3 10/30/2020   MCV 87.3 10/30/2020   PLT 248.0 10/30/2020      Component Value Date/Time   NA 139 10/30/2020 0959   NA 142 04/21/2020 0925   K 3.7 10/30/2020 0959   CL 101 10/30/2020 0959   CO2 30 10/30/2020 0959   GLUCOSE 76 10/30/2020 0959   GLUCOSE 126 (H) 03/25/2014 6073  BUN 17 10/30/2020 0959   BUN 13 04/21/2020 0925   CREATININE 0.80 10/30/2020 0959   CALCIUM 8.9 10/30/2020 0959   PROT 6.6 10/30/2020 0959   PROT 6.6 04/21/2020 0925   ALBUMIN 3.7  10/30/2020 0959   ALBUMIN 4.1 04/21/2020 0925   AST 21 10/30/2020 0959   ALT 20 10/30/2020 0959   ALKPHOS 80 10/30/2020 0959   BILITOT 0.3 10/30/2020 0959   BILITOT <0.2 04/21/2020 0925   GFRNONAA 105 04/21/2020 0925   GFRAA 122 04/21/2020 0925   Lab Results  Component Value Date   CHOL 160 03/17/2020   HDL 70 03/17/2020   LDLCALC 71 03/17/2020   TRIG 106 03/17/2020   CHOLHDL 2.3 03/17/2020   Lab Results  Component Value Date   HGBA1C 6.9 (H) 10/30/2020   Lab Results  Component Value Date   VITAMINB12 294 04/12/2017   Lab Results  Component Value Date   TSH 0.59 10/30/2020    ASSESSMENT AND PLAN 66 y.o. year old female   1.  Restless leg syndrome -Currently RLS symptoms are under good control with current medication regimen Baclofen 10 mg twice daily, 20 mg at bedtime  Mirapex 0.75, 1 in the morning, 3 at night  Klonopin 0.5 mg 1 tablet at bedtime -In past, Feb 2022 Iron TIBC were normal range, Ferritin was 18, we discussed levels < 50 have been associated with RLS symptoms, claims she cannot absolutely not tolerate oral iron, we can recheck/revisit in the future -Will be followed by Dr. Brett Fairy since Dr. Jannifer Franklin retired  2.  History of seizures well-controlled -Continue Carbamazepine 200 mg, 1/2 tablet daily for seizures -Reviewed labs from August 2022 CMP, CBC were unremarkable, long time stable level of carbamazepine 3.4 in Feb 2022  Butler Denmark, Laqueta Jean, DNP 04/29/2021, 10:26 AM [facility] [address], [place], [place] [address] [phone]

## 2021-04-29 ENCOUNTER — Encounter: Payer: Self-pay | Admitting: Neurology

## 2021-04-29 ENCOUNTER — Ambulatory Visit (INDEPENDENT_AMBULATORY_CARE_PROVIDER_SITE_OTHER): Payer: Medicare Other | Admitting: Neurology

## 2021-04-29 VITALS — BP 169/90 | HR 85 | Ht 64.0 in | Wt 263.0 lb

## 2021-04-29 DIAGNOSIS — G40909 Epilepsy, unspecified, not intractable, without status epilepticus: Secondary | ICD-10-CM | POA: Diagnosis not present

## 2021-04-29 DIAGNOSIS — G2581 Restless legs syndrome: Secondary | ICD-10-CM

## 2021-04-29 MED ORDER — BACLOFEN 10 MG PO TABS: ORAL_TABLET | ORAL | 1 refills | Status: AC

## 2021-04-29 MED ORDER — CARBAMAZEPINE 200 MG PO TABS: 100.0000 mg | ORAL_TABLET | Freq: Every day | ORAL | 3 refills | Status: AC

## 2021-04-29 MED ORDER — CLONAZEPAM 0.5 MG PO TABS: 0.5000 mg | ORAL_TABLET | Freq: Every day | ORAL | 5 refills | Status: AC

## 2021-04-29 MED ORDER — PRAMIPEXOLE DIHYDROCHLORIDE 0.75 MG PO TABS: ORAL_TABLET | ORAL | 1 refills | Status: DC

## 2021-04-30 ENCOUNTER — Ambulatory Visit: Payer: Medicare Other | Admitting: Cardiology

## 2021-05-01 ENCOUNTER — Other Ambulatory Visit: Payer: Self-pay | Admitting: Emergency Medicine

## 2021-05-01 DIAGNOSIS — E1159 Type 2 diabetes mellitus with other circulatory complications: Secondary | ICD-10-CM

## 2021-05-06 ENCOUNTER — Telehealth: Payer: Self-pay | Admitting: Emergency Medicine

## 2021-05-06 ENCOUNTER — Other Ambulatory Visit: Payer: Self-pay | Admitting: Emergency Medicine

## 2021-05-06 DIAGNOSIS — F411 Generalized anxiety disorder: Secondary | ICD-10-CM

## 2021-05-06 DIAGNOSIS — F3178 Bipolar disorder, in full remission, most recent episode mixed: Secondary | ICD-10-CM

## 2021-05-06 NOTE — Telephone Encounter (Signed)
Melony- Pharmacist requesting refill on DULoxetine (CYMBALTA) 60 MG capsule. States pt was denied it, but actually needs refill. Does not need 30 MG, because just got 90 count.  ? ? ?Fax #- [phone]  ?

## 2021-05-07 NOTE — Telephone Encounter (Signed)
Medication was refilled 04/30/21 for 90 days ?

## 2021-05-11 ENCOUNTER — Other Ambulatory Visit: Payer: Self-pay | Admitting: Emergency Medicine

## 2021-05-11 DIAGNOSIS — F411 Generalized anxiety disorder: Secondary | ICD-10-CM

## 2021-05-11 DIAGNOSIS — F3178 Bipolar disorder, in full remission, most recent episode mixed: Secondary | ICD-10-CM

## 2021-05-13 ENCOUNTER — Ambulatory Visit (INDEPENDENT_AMBULATORY_CARE_PROVIDER_SITE_OTHER): Payer: Medicare Other

## 2021-05-13 ENCOUNTER — Telehealth: Payer: Self-pay | Admitting: Cardiology

## 2021-05-13 ENCOUNTER — Other Ambulatory Visit: Payer: Self-pay

## 2021-05-13 DIAGNOSIS — R0602 Shortness of breath: Secondary | ICD-10-CM | POA: Diagnosis not present

## 2021-05-13 NOTE — Telephone Encounter (Signed)
Lmov to provide scheduling line # for [place] transportation.  ? ?[phone]  ?

## 2021-05-14 LAB — ECHOCARDIOGRAM COMPLETE
AR max vel: 2.89 cm2
AV Area VTI: 2.82 cm2
AV Area mean vel: 2.91 cm2
AV Mean grad: 9 mmHg
AV Peak grad: 16.6 mmHg
Ao pk vel: 2.04 m/s
Area-P 1/2: 4.71 cm2
Calc EF: 58.9 %
S' Lateral: 3.6 cm
Single Plane A2C EF: 54.6 %
Single Plane A4C EF: 62.7 %

## 2021-05-19 ENCOUNTER — Ambulatory Visit: Payer: Medicare Other | Admitting: Cardiology

## 2021-05-20 ENCOUNTER — Ambulatory Visit: Payer: Medicare Other | Admitting: Emergency Medicine

## 2021-05-28 ENCOUNTER — Ambulatory Visit (INDEPENDENT_AMBULATORY_CARE_PROVIDER_SITE_OTHER): Payer: Medicare Other | Admitting: *Deleted

## 2021-05-28 ENCOUNTER — Encounter: Payer: Self-pay | Admitting: *Deleted

## 2021-05-28 DIAGNOSIS — E119 Type 2 diabetes mellitus without complications: Secondary | ICD-10-CM

## 2021-05-28 DIAGNOSIS — I10 Essential (primary) hypertension: Secondary | ICD-10-CM

## 2021-05-28 NOTE — Chronic Care Management (AMB) (Signed)
?Chronic Care Management  ? ?CCM RN Visit Note ? ?05/28/2021 ?Name: Rachel Luna MRN: [id number] DOB: [birth year] ? ?Subjective: ?Rachel Luna is a 66 y.o. year old female who is a primary care patient of Mitchel Honour, Ines Bloomer, MD. The care management team was consulted for assistance with disease management and care coordination needs.   ? ?Engaged with patient by telephone for follow up visit in response to provider referral for case management and/or care coordination services.  ? ?Consent to Services:  ?The patient was given information about Chronic Care Management services, agreed to services, and gave verbal consent prior to initiation of services.  Please see initial visit note for detailed documentation.  ?Patient agreed to services and verbal consent obtained.  ? ?Assessment: Review of patient past medical history, allergies, medications, health status, including review of consultants reports, laboratory and other test data, was performed as part of comprehensive evaluation and provision of chronic care management services.  ? ?SDOH (Social Determinants of Health) assessments and interventions performed:  ?SDOH Interventions   ? ?Flowsheet Row Most Recent Value  ?SDOH Interventions   ?Food Insecurity Interventions Other (Comment)  [Community Resource Care Guide referral placed to provide food resources]  ?Housing Interventions Intervention Not Indicated  [lives alone in mobile home]  ?Transportation Interventions Other (Comment)  [Referral to [employer]- for transportation resources]  ? ?  ? CCM Care Plan ? ?Allergies  ?Allergen Reactions  ? Belsomra [Suvorexant] Other (See Comments)  ?  Vivid dreams  ? Wellbutrin [Bupropion] Other (See Comments)  ?  Irritability and Agitation  ? Geodon [Ziprasidone Hcl] Other (See Comments)  ?  Worsened Restless Leg Syndrome  ? Lisinopril Cough  ? Neupro [Rotigotine] Rash  ? ?Outpatient Encounter Medications as of 05/28/2021  ?Medication Sig  ?  ACCU-CHEK AVIVA PLUS test strip Use in the morning and after meals as needed.  ? amLODipine-valsartan (EXFORGE) 5-160 MG tablet TAKE 1 TABLET ONCE DAILY  ? baclofen (LIORESAL) 10 MG tablet One tablet twice during the day and 2 at night  ? carbamazepine (TEGRETOL) 200 MG tablet Take 0.5 tablets (100 mg total) by mouth daily.  ? cetirizine (ZYRTEC) 10 MG tablet Take 10 mg by mouth daily.  ? clonazePAM (KLONOPIN) 0.5 MG tablet Take 1 tablet (0.5 mg total) by mouth at bedtime.  ? dapagliflozin propanediol (FARXIGA) 5 MG TABS tablet Take 1 tablet (5 mg total) by mouth every morning.  ? DULoxetine (CYMBALTA) 30 MG capsule TAKE 1 CAPSULE DAILY TO BE COMBINED WITH '60MG'$  CAPSULE  ? DULoxetine (CYMBALTA) 60 MG capsule TAKE 1 CAPSULE BY MOUTH ONCE DAILY - TAKE WITH '30MG'$  CAPSULE  ? estradiol (ESTRACE) 0.5 MG tablet Take 1 tablet (0.5 mg total) by mouth daily.  ? gabapentin (NEURONTIN) 800 MG tablet TAKE 1 TAKE THREE TIMES A DAY  ? glimepiride (AMARYL) 4 MG tablet Take 1 tablet (4 mg total) by mouth daily with breakfast. TAKE 1 TABLET EVERY MORNING WITH BREAKFAST  ? JANUVIA 100 MG tablet TAKE 1 TABLET BY MOUTH ONCE DAILY  ? meloxicam (MOBIC) 15 MG tablet Take 15 mg by mouth daily.  ? omeprazole (PRILOSEC) 20 MG capsule Take 20 mg by mouth as needed (heartburn).  ? pramipexole (MIRAPEX) 0.75 MG tablet One tablet in the morning, 3 at night  ? propranolol (INDERAL) 20 MG tablet Take 1 tablet (20 mg total) by mouth 2 (two) timesdaily.  ? rosuvastatin (CRESTOR) 40 MG tablet Take 1 tablet (40 mg total) by mouth daily.  ? ?  No facility-administered encounter medications on file as of 05/28/2021.  ? ?Patient Active Problem List  ? Diagnosis Date Noted  ? Palpitations 10/30/2020  ? Panic disorder 10/30/2020  ? Syncope and collapse 10/27/2020  ? Benzodiazepine dependence in remission ([place]) 03/17/2020  ? Morbid obesity ([place]) 03/17/2020  ? Adrenal mass ([place]) 03/17/2020  ? PAD (peripheral artery disease) ([place]) 02/18/2020  ? Chronic venous  insufficiency 02/18/2020  ? Hyperlipidemia 02/18/2020  ? Bipolar disorder, in full remission, most recent episode mixed ([place]) 01/03/2020  ? Attention deficit 01/03/2020  ? Aortic atherosclerosis ([place]) 11/20/2019  ? Restless leg syndrome 02/15/2019  ? Bipolar I disorder, most recent episode mixed ([place]) 09/14/2018  ? GAD (generalized anxiety disorder) 09/14/2018  ? Insomnia due to mental disorder 09/14/2018  ? Diabetes ([place]) 07/13/2018  ? Seizure disorder ([place]) 10/19/2017  ? Osteoarthritis of ankle and foot 03/27/2015  ? ANA positive 03/05/2015  ? Elevated rheumatoid factor 03/05/2015  ? Hypertension associated with diabetes ([place]) 09/04/2014  ? Avitaminosis D 09/04/2014  ? Chronic low back pain 02/18/2014  ? Menopausal hot flushes 08/14/2012  ? Arthritis of knee, degenerative 04/06/2011  ? Benign essential HTN 07/13/2010  ? Essential (primary) hypertension 07/13/2010  ? Insomnia, unspecified 07/13/2010  ? Impaired fasting glucose 07/13/2010  ? Chronic pain 01/08/2010  ? Affective disorder, major 11/05/2009  ? Major depressive disorder 11/05/2009  ? Major depressive disorder, single episode, unspecified 11/05/2009  ? ?Conditions to be addressed/monitored:  HTN and DMII ? ?Care Plan : RN Care Manager Plan of Care  ?Updates made by Knox Royalty, RN since 05/28/2021 12:00 AM  ?  ? ?Problem: Chronic Disease Management Needs   ?Priority: High  ?  ? ?Long-Range Goal: Ongoing progression of plan of care for long term chronic disease management   ?Start Date: 01/20/2021  ?Expected End Date: 01/20/2022  ?Priority: High  ?Note:   ?Current Barriers:  ?Chronic Disease Management support and education needs related to HTN and DMII in setting of chronic depression/ anxiety ?02/03/21- patient reported 01/20/21 loss of her roommate/ friend of 36 years; reports remains active with psychiatric provider she is well established with: today, she reports she believes she is progressing through her grief well- depression screen  updated ?05/28/21: continues to report regularly followed by her counselor/ psychiatric provider; reports she believes she "is doing much better;" and is "stable" in recovering from her grief around the recent death of her roommate ?Patient consistently non-adherent to monitoring blood sugars/ blood pressures at home; lack of motivation in self-care ? ?RNCM Clinical Goal(s):  ?Patient will demonstrate Improved health management independence HTN; DMII; anxiety/ depression  through collaboration with RN Care manager, provider, and care team.  ? ?Interventions: ?1:1 collaboration with primary care provider regarding development and update of comprehensive plan of care as evidenced by provider attestation and co-signature ?Inter-disciplinary care team collaboration (see longitudinal plan of care) ?Evaluation of current treatment plan related to  self management and patient's adherence to plan as established by provider ?05/28/21: ?Review of patient status, including review of consultants reports, relevant laboratory and other test results, and medications completed ?SDOH updated: [employer] referral placed today ?reports that the transportation she had lined up through her insurance company "never showed up;" therefore she had to cancel/ re-schedule more provider appointments: she would like additional resources ?Confirmed that she has a vehicle that is "now registered" with the DMV-- however, states she does not have a valid drivers license; states she "will drive anyway if" she "has  to;" I encouraged her to not drive without a valid drivers license; she states she is hopeful to obtain a drivers license "soon," but states she "will probably not be able to afford gas" ?Reports she continues to get food stamps for $211.00/ month, but is still having difficulty affording food, "especially healthy food," in light of current economy/ grocery prices  ?Pain assessment updated: she denies pain today ?Reports  some "slight burning" when she urinates, which started "last night-" not sure if she might have a UTI; provided education around signs/ symptoms UTI along with corresponding action plan ?She is unable to schedule urgent PCP o

## 2021-05-28 NOTE — Patient Instructions (Signed)
Visit Information ? ?Rachel Luna, thank you for taking time to talk with me today. Please don't hesitate to contact me if I can be of assistance to you before our next scheduled telephone appointment ? ?As we discussed today, I have asked the [facility] team to contact you by phone to provide resources for transportation and food acquisition- please listen out for a call from the Care Guide Team: remember, once you receive the resources you have requested, it will be your responsibility to follow up to obtain any services from the resources that are provided ? ?Below are the goals we discussed today:  ?Patient Self-Care Activities: ?[name]  will: ?Take medications as prescribed ?Attend all scheduled provider appointments ?Call pharmacy for medication refills ?Call provider office for new concerns or questions ?Continue actively engaging with your psychiatric counselor as you have been ?Please consider checking and writing down on paper fasting (first thing in the morning, before eating) blood sugars at home ?Please consider occasionally checking your blood pressures at home- please write down your blood pressures when you check them ?Continue to follow heart healthy, low salt, low cholesterol, carbohydrate-modified, low sugar diet ?Keep up the great work preventing falls ?Read over the attached information about UTI; if you continue to experience symptoms of UTI, please schedule an appointment with your doctor or go to the urgent care center or ED for evaluation ? ?Our next scheduled telephone follow up visit/ appointment is scheduled on: Tuesday, Jul 28, 2021 at 9:00 am- This is a PHONE [place] appointment ? ?If you need to cancel or re-schedule our visit, please call [phone] and our care guide team will be happy to assist you. ?  ?I look forward to hearing about your progress. ?  ?[name], RN, BSN, CCRN Alumnus ?[facility] ?([phone]: direct office ? ?If you are experiencing a Mental Health or [place] or need someone to talk to, please  ?call the Suicide and Crisis Lifeline: 988 ?call the [place] National Suicide Prevention Lifeline: 702-457-4373 or TTY: (901)305-1459 TTY 201-713-5524) to talk to a trained counselor ?call 1-800-273-TALK (toll free, 24 hour hotline) ?go to [facility] [address], [place] [phone]) ?call 911  ? ?The patient verbalized understanding of instructions, educational materials, and care plan provided today and agreed to receive a mailed copy of patient instructions, educational materials, and care plan ? ?Urinary Tract Infection, Adult ?A urinary tract infection (UTI) is an infection of any part of the urinary tract. The urinary tract includes the kidneys, ureters, bladder, and urethra. These organs make, store, and get rid of urine in the body. ?An upper UTI affects the ureters and kidneys. A lower UTI affects the bladder and urethra. ?What are the causes? ?Most urinary tract infections are caused by bacteria in your genital area around your urethra, where urine leaves your body. These bacteria grow and cause inflammation of your urinary tract. ?What increases the risk? ?You are more likely to develop this condition if: ?You have a urinary catheter that stays in place. ?You are not able to control when you urinate or have a bowel movement (incontinence). ?You are female and you: ?Use a spermicide or diaphragm for birth control. ?Have low estrogen levels. ?Are pregnant. ?You have certain genes that increase your risk. ?You are sexually active. ?You take antibiotic medicines. ?You have a condition that causes your flow of urine to slow down, such as: ?An enlarged  prostate, if you are female. ?Blockage in your urethra. ?A kidney stone. ?A nerve condition that affects your bladder control (neurogenic bladder). ?Not getting enough to drink, or not  urinating often. ?You have certain medical conditions, such as: ?Diabetes. ?A weak disease-fighting system (immunesystem). ?Sickle cell disease. ?Gout. ?Spinal cord injury. ?What are the signs or symptoms? ?Symptoms of this condition include: ?Needing to urinate right away (urgency). ?Frequent urination. This may include small amounts of urine each time you urinate. ?Pain or burning with urination. ?Blood in the urine. ?Urine that smells bad or unusual. ?Trouble urinating. ?Cloudy urine. ?Vaginal discharge, if you are female. ?Pain in the abdomen or the lower back. ?You may also have: ?Vomiting or a decreased appetite. ?Confusion. ?Irritability or tiredness. ?A fever or chills. ?Diarrhea. ?The first symptom in older adults may be confusion. In some cases, they may not have any symptoms until the infection has worsened. ?How is this diagnosed? ?This condition is diagnosed based on your medical history and a physical exam. You may also have other tests, including: ?Urine tests. ?Blood tests. ?Tests for STIs (sexually transmitted infections). ?If you have had more than one UTI, a cystoscopy or imaging studies may be done to determine the cause of the infections. ?How is this treated? ?Treatment for this condition includes: ?Antibiotic medicine. ?Over-the-counter medicines to treat discomfort. ?Drinking enough water to stay hydrated. ?If you have frequent infections or have other conditions such as a kidney stone, you may need to see a health care provider who specializes in the urinary tract (urologist). ?In rare cases, urinary tract infections can cause sepsis. Sepsis is a life-threatening condition that occurs when the body responds to an infection. Sepsis is treated in the hospital with IV antibiotics, fluids, and other medicines. ?Follow these instructions at home: ?Medicines ?Take over-the-counter and prescription medicines only as told by your health care provider. ?If you were prescribed an antibiotic medicine,  take it as told by your health care provider. Do not stop using the antibiotic even if you start to feel better. ?General instructions ?Make sure you: ?Empty your bladder often and completely. Do not hold urine for long periods of time. ?Empty your bladder after sex. ?Wipe from front to back after urinating or having a bowel movement if you are female. Use each tissue only one time when you wipe. ?Drink enough fluid to keep your urine pale yellow. ?Keep all follow-up visits. This is important. ?Contact a health care provider if: ?Your symptoms do not get better after 1-2 days. ?Your symptoms go away and then return. ?Get help right away if: ?You have severe pain in your back or your lower abdomen. ?You have a fever or chills. ?You have nausea or vomiting. ?Summary ?A urinary tract infection (UTI) is an infection of any part of the urinary tract, which includes the kidneys, ureters, bladder, and urethra. ?Most urinary tract infections are caused by bacteria in your genital area. ?Treatment for this condition often includes antibiotic medicines. ?If you were prescribed an antibiotic medicine, take it as told by your health care provider. Do not stop using the antibiotic even if you start to feel better. ?Keep all follow-up visits. This is important. ?This information is not intended to replace advice given to you by your health care provider. Make sure you discuss any questions you have with your health care provider. ?Document Revised: 10/05/2019 Document Reviewed: 10/05/2019 ?Elsevier Patient Education ? 2022 [place]. ?  ? ?

## 2021-05-29 ENCOUNTER — Telehealth: Payer: Self-pay

## 2021-05-29 NOTE — Telephone Encounter (Signed)
PLEASE DISREGARD

## 2021-05-29 NOTE — Telephone Encounter (Signed)
? ?  Telephone encounter was:  Successful.  ?05/29/2021 ?Name: Rachel Luna MRN: [id number] DOB: [birth year] ? ?Rachel Luna is a 66 y.o. year old female who is a primary care patient of Sagardia, Ines Bloomer, MD . The community resource team was consulted for assistance with Transportation Needs  and Food Insecurity ? ?Care guide performed the following interventions: Patient provided with information about care guide support team and interviewed to confirm resource needs. Follow up call to confirm that [employer] will start delivering meals 3/30 and they will be in touch with her ? ?Follow Up Plan:  Care guide will follow up with patient by phone over the next two weeks ? ? ? ?Larena Sox ?Care Guide, Embedded Care Coordination ?[facility], Care Management  ?[phone] ?[address], [place], [place] [address] ?Phone: [phone] ?Email: [email or web address] ? ?  ?

## 2021-05-29 NOTE — Telephone Encounter (Signed)
? ?  Telephone encounter was:  Successful.  ?05/29/2021 ?Name: SHEALYNN SAULNIER MRN: [id number] DOB: 04/26/1955 ? ?INDIAH HEYDEN is a 66 y.o. year old female who is a primary care patient of Sagardia, Ines Bloomer, MD . The community resource team was consulted for assistance with Transportation Needs  and Food Insecurity ? ?Care guide performed the following interventions: Patient provided with information about care guide support team and interviewed to confirm resource needs.Patient Is struggling financialy since her roommate passed away. She cant afford things on her own and does not have a licence to drive yet but has a car ? ?Follow Up Plan:  Care guide will follow up with patient by phone over the next two weeks ? ? ? ?Larena Sox ?Care Guide, Embedded Care Coordination ?[facility], Care Management  ?[phone] ?[address], [place], [place] [address] ?Phone: [phone] ?Email: [email or web address] ? ?  ?

## 2021-05-29 NOTE — Telephone Encounter (Signed)
? ? ?  Mailing letter  ? ? ? ?Larena Sox ?Care Guide, Embedded Care Coordination ?[facility], Care Management  ?[phone] ?[address], [place], [place] [address] ?Phone: [phone] ?Email: [email or web address] ? ?  ?

## 2021-06-05 DIAGNOSIS — E119 Type 2 diabetes mellitus without complications: Secondary | ICD-10-CM | POA: Diagnosis not present

## 2021-06-05 DIAGNOSIS — I10 Essential (primary) hypertension: Secondary | ICD-10-CM

## 2021-06-17 ENCOUNTER — Encounter: Payer: Self-pay | Admitting: Emergency Medicine

## 2021-06-17 ENCOUNTER — Ambulatory Visit (INDEPENDENT_AMBULATORY_CARE_PROVIDER_SITE_OTHER): Payer: Medicare Other | Admitting: Emergency Medicine

## 2021-06-17 VITALS — BP 130/72 | HR 94 | Temp 98.4°F | Ht 64.0 in | Wt 289.0 lb

## 2021-06-17 DIAGNOSIS — F411 Generalized anxiety disorder: Secondary | ICD-10-CM | POA: Diagnosis not present

## 2021-06-17 DIAGNOSIS — E1159 Type 2 diabetes mellitus with other circulatory complications: Secondary | ICD-10-CM

## 2021-06-17 DIAGNOSIS — I10 Essential (primary) hypertension: Secondary | ICD-10-CM

## 2021-06-17 DIAGNOSIS — I152 Hypertension secondary to endocrine disorders: Secondary | ICD-10-CM

## 2021-06-17 DIAGNOSIS — F3178 Bipolar disorder, in full remission, most recent episode mixed: Secondary | ICD-10-CM

## 2021-06-17 LAB — COMPREHENSIVE METABOLIC PANEL
ALT: 27 U/L (ref 0–35)
AST: 21 U/L (ref 0–37)
Albumin: 4.2 g/dL (ref 3.5–5.2)
Alkaline Phosphatase: 93 U/L (ref 39–117)
BUN: 16 mg/dL (ref 6–23)
CO2: 33 mEq/L — ABNORMAL HIGH (ref 19–32)
Calcium: 9.1 mg/dL (ref 8.4–10.5)
Chloride: 100 mEq/L (ref 96–112)
Creatinine, Ser: 0.56 mg/dL (ref 0.40–1.20)
GFR: 96.98 mL/min (ref >60.00)
Glucose, Bld: 174 mg/dL — ABNORMAL HIGH (ref 70–99)
Potassium: 3.8 mEq/L (ref 3.5–5.1)
Sodium: 140 mEq/L (ref 135–145)
Total Bilirubin: 0.2 mg/dL (ref 0.2–1.2)
Total Protein: 7 g/dL (ref 6.0–8.3)

## 2021-06-17 LAB — LIPID PANEL
Cholesterol: 161 mg/dL (ref 0–200)
HDL: 68.3 mg/dL (ref >39.00)
LDL Cholesterol: 68 mg/dL (ref 0–99)
NonHDL: 92.94
Total CHOL/HDL Ratio: 2
Triglycerides: 127 mg/dL (ref 0.0–149.0)
VLDL: 25.4 mg/dL (ref 0.0–40.0)

## 2021-06-17 LAB — HEMOGLOBIN A1C: Hgb A1c MFr Bld: 8.6 % — ABNORMAL HIGH (ref 4.6–6.5)

## 2021-06-17 MED ORDER — LOSARTAN POTASSIUM-HCTZ 100-12.5 MG PO TABS: 1.0000 | ORAL_TABLET | Freq: Every day | ORAL | 3 refills | Status: AC

## 2021-06-17 NOTE — Patient Instructions (Signed)

## 2021-06-17 NOTE — Progress Notes (Signed)
Rachel Luna ?66 y.o. ? ? ?Chief complaint: Hypertension follow-up ? ?HISTORY OF PRESENT ILLNESS: ?This is a 66 y.o. female with chronic medical problems, mostly hypertension, here for follow-up. ?States blood pressure medication is giving her side effects, making her feel lightheaded and dizzy. ?Wants to change blood pressure medication. ?Recent passing of female roommate.  Grieving and stressed about it. ?No other complaints or medical concerns today. ? ? ?HPI ? ? ?Prior to Admission medications   ?Medication Sig Start Date End Date Taking? Authorizing Provider  ?ACCU-CHEK AVIVA PLUS test strip Use in the morning and after meals as needed. 07/13/18  Yes Shamonica Schadt, Ines Bloomer, MD  ?amLODipine-valsartan [facility]) 5-160 MG tablet TAKE 1 TABLET ONCE DAILY 05/01/21  Yes Horald Pollen, MD  ?baclofen (LIORESAL) 10 MG tablet One tablet twice during the day and 2 at night 04/29/21  Yes Suzzanne Cloud, NP  ?carbamazepine (TEGRETOL) 200 MG tablet Take 0.5 tablets (100 mg total) by mouth daily. 04/29/21  Yes Suzzanne Cloud, NP  ?cetirizine (ZYRTEC) 10 MG tablet Take 10 mg by mouth daily.   Yes [provider]  ?clonazePAM (KLONOPIN) 0.5 MG tablet Take 1 tablet (0.5 mg total) by mouth at bedtime. 04/29/21  Yes Suzzanne Cloud, NP  ?dapagliflozin propanediol (FARXIGA) 5 MG TABS tablet Take 1 tablet (5 mg total) by mouth every morning. 04/13/21 07/12/21 Yes Jazariah Teall, Ines Bloomer, MD  ?DULoxetine (CYMBALTA) 30 MG capsule TAKE 1 CAPSULE DAILY TO BE COMBINED WITH '60MG'$  CAPSULE 05/01/21  Yes Wash Nienhaus, Ines Bloomer, MD  ?DULoxetine (CYMBALTA) 60 MG capsule TAKE 1 CAPSULE BY MOUTH ONCE DAILY - TAKE WITH '30MG'$  CAPSULE 02/03/21  Yes Connelly Spruell, Ines Bloomer, MD  ?estradiol (ESTRACE) 0.5 MG tablet Take 1 tablet (0.5 mg total) by mouth daily. 12/26/18  Yes Shelly Bombard, MD  ?gabapentin (NEURONTIN) 800 MG tablet TAKE 1 TAKE THREE TIMES A DAY 04/23/21  Yes Harvir Patry, Ines Bloomer, MD  ?JANUVIA 100 MG tablet TAKE 1 TABLET BY MOUTH ONCE  DAILY 04/23/21  Yes Kyria Bumgardner, Ines Bloomer, MD  ?meloxicam (MOBIC) 15 MG tablet Take 15 mg by mouth daily. 07/04/15  Yes [provider]  ?omeprazole (PRILOSEC) 20 MG capsule Take 20 mg by mouth as needed (heartburn).   Yes [provider]  ?pramipexole (MIRAPEX) 0.75 MG tablet One tablet in the morning, 3 at night 04/29/21  Yes Suzzanne Cloud, NP  ?propranolol (INDERAL) 20 MG tablet Take 1 tablet (20 mg total) by mouth 2 (two) timesdaily. 04/23/21  Yes Saia Derossett, Ines Bloomer, MD  ?glimepiride (AMARYL) 4 MG tablet Take 1 tablet (4 mg total) by mouth daily with breakfast. TAKE 1 TABLET EVERY MORNING WITH BREAKFAST 03/16/21 06/14/21  Horald Pollen, MD  ?rosuvastatin (CRESTOR) 40 MG tablet Take 1 tablet (40 mg total) by mouth daily. 07/16/20 12/11/20  Horald Pollen, MD  ? ? ?Allergies  ?Allergen Reactions  ? Belsomra [Suvorexant] Other (See Comments)  ?  Vivid dreams  ? Wellbutrin [Bupropion] Other (See Comments)  ?  Irritability and Agitation  ? Geodon [Ziprasidone Hcl] Other (See Comments)  ?  Worsened Restless Leg Syndrome  ? Lisinopril Cough  ? Neupro [Rotigotine] Rash  ? ? ?Patient Active Problem List  ? Diagnosis Date Noted  ? Palpitations 10/30/2020  ? Panic disorder 10/30/2020  ? Syncope and collapse 10/27/2020  ? Benzodiazepine dependence in remission ([place]) 03/17/2020  ? Morbid obesity ([place]) 03/17/2020  ? Adrenal mass ([place]) 03/17/2020  ? PAD (peripheral artery disease) ([place]) 02/18/2020  ?  Chronic venous insufficiency 02/18/2020  ? Hyperlipidemia 02/18/2020  ? Bipolar disorder, in full remission, most recent episode mixed ([place]) 01/03/2020  ? Attention deficit 01/03/2020  ? Aortic atherosclerosis ([place]) 11/20/2019  ? Restless leg syndrome 02/15/2019  ? Bipolar I disorder, most recent episode mixed ([place]) 09/14/2018  ? GAD (generalized anxiety disorder) 09/14/2018  ? Insomnia due to mental disorder 09/14/2018  ? Diabetes ([place]) 07/13/2018  ? Seizure disorder ([place]) 10/19/2017  ? Osteoarthritis  of ankle and foot 03/27/2015  ? ANA positive 03/05/2015  ? Elevated rheumatoid factor 03/05/2015  ? Hypertension associated with diabetes ([place]) 09/04/2014  ? Avitaminosis D 09/04/2014  ? Chronic low back pain 02/18/2014  ? Menopausal hot flushes 08/14/2012  ? Arthritis of knee, degenerative 04/06/2011  ? Benign essential HTN 07/13/2010  ? Essential (primary) hypertension 07/13/2010  ? Insomnia, unspecified 07/13/2010  ? Impaired fasting glucose 07/13/2010  ? Chronic pain 01/08/2010  ? Affective disorder, major 11/05/2009  ? Major depressive disorder 11/05/2009  ? Major depressive disorder, single episode, unspecified 11/05/2009  ? ? ?Past Medical History:  ?Diagnosis Date  ? Anxiety   ? Arthritis   ? Bipolar disorder ([place])   ? chronic low back pain   ? Chronic low back pain 02/18/2014  ? Depression   ? Diabetes mellitus without complication ([place])   ? Diabetes mellitus, type II ([place])   ? Fibroids   ? GERD (gastroesophageal reflux disease)   ? Gout   ? Hyperlipidemia   ? Hypertension   ? Obesity   ? Syncope and collapse 10/27/2020  ? ? ?Past Surgical History:  ?Procedure Laterality Date  ? ABDOMINAL HYSTERECTOMY    ? CHOLECYSTECTOMY    ? FOOT SURGERY Left   ? KNEE ARTHROPLASTY    ? OVARIAN CYST REMOVAL    ? SPINE SURGERY    ? ? ?Social History  ? ?Socioeconomic History  ? Marital status: Divorced  ?  Spouse name: Not on file  ? Number of children: 0  ? Years of education: 10  ? Highest education level: 10th grade  ?Occupational History  ?  Employer: UNEMPLOYED  ?Tobacco Use  ? Smoking status: Passive Smoke Exposure - Never Smoker  ? Smokeless tobacco: Never  ? Tobacco comments:  ?  room mate smokes  ?Vaping Use  ? Vaping Use: Never used  ?Substance and Sexual Activity  ? Alcohol use: No  ?  Alcohol/week: 0.0 standard drinks  ? Drug use: No  ? Sexual activity: Not Currently  ?Other Topics Concern  ? Not on file  ?Social History Narrative  ? Patient is divorced and lives with a roommate.  ? Patient is right-handed.  ?  Patient drinks 6 sodas daily.  ?   ?   ? ?Social Determinants of Health  ? ?Financial Resource Strain: High Risk  ? Difficulty of Paying Living Expenses: Very hard  ?Food Insecurity: Food Insecurity Present  ? Worried About Charity fundraiser in the Last Year: Often true  ? Ran Out of Food in the Last Year: Often true  ?Transportation Needs: Unmet Transportation Needs  ? Lack of Transportation (Medical): Yes  ? Lack of Transportation (Non-Medical): Yes  ?Physical Activity: Inactive  ? Days of Exercise per Week: 0 days  ? Minutes of Exercise per Session: 0 min  ?Stress: Stress Concern Present  ? Feeling of Stress : Very much  ?Social Connections: Socially Isolated  ? Frequency of Communication with Friends and Family: More than three times a week  ?  Frequency of Social Gatherings with Friends and Family: More than three times a week  ? Attends Religious Services: Never  ? Active Member of Clubs or Organizations: No  ? Attends Archivist Meetings: Never  ? Marital Status: Divorced  ?Intimate Partner Violence: Not At Risk  ? Fear of Current or Ex-Partner: No  ? Emotionally Abused: No  ? Physically Abused: No  ? Sexually Abused: No  ? ? ?Family History  ?Problem Relation Age of Onset  ? Diabetes Mother   ? Cancer Mother   ?     breast  ? Heart disease Mother   ? Mental illness Mother   ?     bipolar disorder  ? Stroke Mother   ? Breast cancer Mother   ? Hypertension Mother   ? High Cholesterol Mother   ? Diabetes Father   ? Hypertension Father   ? Diabetes Brother   ? Bipolar disorder Brother   ? Diabetes Brother   ? Breast cancer Maternal Aunt   ? Breast cancer Maternal Grandmother   ? ? ? ?Review of Systems  ?Constitutional: Negative.  Negative for chills and fever.  ?HENT: Negative.  Negative for congestion and sore throat.   ?Respiratory: Negative.  Negative for cough and shortness of breath.   ?Cardiovascular: Negative.  Negative for chest pain and palpitations.  ?Gastrointestinal:  Negative for  abdominal pain, diarrhea, nausea and vomiting.  ?Genitourinary: Negative.   ?Skin: Negative.  Negative for rash.  ?Neurological:  Negative for dizziness and headaches.  ?All other systems reviewed and are negative.

## 2021-06-17 NOTE — Assessment & Plan Note (Addendum)
Well-controlled hypertension. ?BP Readings from Last 3 Encounters:  ?06/17/21 130/72  ?04/29/21 (!) 169/90  ?12/11/20 (!) 144/66  ?Wants blood pressure medication changed due to side effects. ?Will change to losartan-HCTZ 100-12.5 mg daily. ?Advised to monitor blood pressure readings at home daily for the next several weeks and keep a log. ?Uncontrolled diabetes with hemoglobin A1c at 8.6 today. ?Lab Results  ?Component Value Date  ? HGBA1C 8.6 (H) 06/17/2021  ?Has been noncompliant with diet and medications. ?Diet and nutrition discussed. ?Continue Farxiga 5 mg, Januvia 100 mg, and glimepiride 4 mg daily. ?

## 2021-06-23 ENCOUNTER — Ambulatory Visit: Payer: Medicare Other | Admitting: Nurse Practitioner

## 2021-06-24 ENCOUNTER — Other Ambulatory Visit: Payer: Self-pay

## 2021-06-24 ENCOUNTER — Emergency Department: Payer: Medicare Other

## 2021-06-24 ENCOUNTER — Emergency Department
Admission: EM | Admit: 2021-06-24 | Discharge: 2021-06-24 | Disposition: A | Discharge status: Home / Self Care | Payer: Medicare Other | Attending: Emergency Medicine | Admitting: Emergency Medicine

## 2021-06-24 ENCOUNTER — Telehealth: Payer: Self-pay

## 2021-06-24 DIAGNOSIS — R300 Dysuria: Secondary | ICD-10-CM | POA: Diagnosis present

## 2021-06-24 DIAGNOSIS — E119 Type 2 diabetes mellitus without complications: Secondary | ICD-10-CM | POA: Insufficient documentation

## 2021-06-24 DIAGNOSIS — R160 Hepatomegaly, not elsewhere classified: Secondary | ICD-10-CM | POA: Diagnosis not present

## 2021-06-24 DIAGNOSIS — I10 Essential (primary) hypertension: Secondary | ICD-10-CM | POA: Insufficient documentation

## 2021-06-24 DIAGNOSIS — N12 Tubulo-interstitial nephritis, not specified as acute or chronic: Secondary | ICD-10-CM | POA: Diagnosis not present

## 2021-06-24 LAB — COMPREHENSIVE METABOLIC PANEL
ALT: 31 U/L (ref 0–44)
AST: 20 U/L (ref 15–41)
Albumin: 3.6 g/dL (ref 3.5–5.0)
Alkaline Phosphatase: 101 U/L (ref 38–126)
Anion gap: 8 (ref 5–15)
BUN: 18 mg/dL (ref 8–23)
CO2: 31 mmol/L (ref 22–32)
Calcium: 9.1 mg/dL (ref 8.9–10.3)
Chloride: 98 mmol/L (ref 98–111)
Creatinine, Ser: 0.64 mg/dL (ref 0.44–1.00)
GFR, Estimated: >60 mL/min (ref >60)
Glucose, Bld: 207 mg/dL — ABNORMAL HIGH (ref 70–99)
Potassium: 3.3 mmol/L — ABNORMAL LOW (ref 3.5–5.1)
Sodium: 137 mmol/L (ref 135–145)
Total Bilirubin: 0.3 mg/dL (ref 0.3–1.2)
Total Protein: 6.8 g/dL (ref 6.5–8.1)

## 2021-06-24 LAB — CBC WITH DIFFERENTIAL/PLATELET
Abs Immature Granulocytes: 0.03 10*3/uL (ref 0.00–0.07)
Basophils Absolute: 0 10*3/uL (ref 0.0–0.1)
Basophils Relative: 1 %
Eosinophils Absolute: 0.1 10*3/uL (ref 0.0–0.5)
Eosinophils Relative: 2 %
HCT: 41.8 % (ref 36.0–46.0)
Hemoglobin: 13 g/dL (ref 12.0–15.0)
Immature Granulocytes: 1 %
Lymphocytes Relative: 31 %
Lymphs Abs: 1.9 10*3/uL (ref 0.7–4.0)
MCH: 26.7 pg (ref 26.0–34.0)
MCHC: 31.1 g/dL (ref 30.0–36.0)
MCV: 85.8 fL (ref 80.0–100.0)
Monocytes Absolute: 0.5 10*3/uL (ref 0.1–1.0)
Monocytes Relative: 9 %
Neutro Abs: 3.6 10*3/uL (ref 1.7–7.7)
Neutrophils Relative %: 56 %
Platelets: 236 10*3/uL (ref 150–400)
RBC: 4.87 MIL/uL (ref 3.87–5.11)
RDW: 14.5 % (ref 11.5–15.5)
WBC: 6.2 10*3/uL (ref 4.0–10.5)
nRBC: 0 % (ref 0.0–0.2)

## 2021-06-24 LAB — URINALYSIS, ROUTINE W REFLEX MICROSCOPIC
Bilirubin Urine: NEGATIVE
Glucose, UA: >=500 mg/dL — AB
Ketones, ur: NEGATIVE mg/dL
Nitrite: POSITIVE — AB
Protein, ur: 30 mg/dL — AB
RBC / HPF: >50 RBC/hpf — ABNORMAL HIGH (ref 0–5)
Specific Gravity, Urine: 1.025 (ref 1.005–1.030)
WBC, UA: >50 WBC/hpf — ABNORMAL HIGH (ref 0–5)
pH: 5 (ref 5.0–8.0)

## 2021-06-24 MED ORDER — ONDANSETRON 4 MG PO TBDP: 4.0000 mg | ORAL_TABLET | Freq: Three times a day (TID) | ORAL | 0 refills | Status: AC | PRN

## 2021-06-24 MED ORDER — SODIUM CHLORIDE 0.9 % IV SOLN
1.0000 g | Freq: Once | INTRAVENOUS | Status: AC
  Administered 2021-06-24: 1 g via INTRAVENOUS
  Filled 2021-06-24: qty 10

## 2021-06-24 MED ORDER — HYDROCODONE-ACETAMINOPHEN 5-325 MG PO TABS: 1.0000 | ORAL_TABLET | ORAL | 0 refills | Status: AC | PRN

## 2021-06-24 MED ORDER — LEVOFLOXACIN 750 MG PO TABS: 750.0000 mg | ORAL_TABLET | Freq: Every day | ORAL | 0 refills | Status: AC

## 2021-06-24 NOTE — ED Triage Notes (Signed)
Pt here with a possible UTI. Pt endorses burning with urination,. Pt tried taking Azo but states it did not help. ?

## 2021-06-24 NOTE — ED Provider Notes (Signed)
? ?[facility] ?Provider Note ? ? ? Event Date/Time  ? First MD Initiated Contact with Patient 06/24/21 1126   ?  (approximate) ? ? ?History  ? ?Chief Complaint ?Urinary Tract Infection ? ? ?HPI ?Rachel Luna is a 66 y.o. female, history of hypertension, seizure disorder, diabetes, bipolar 1, GAD, panic disorder, presents to the emergency department for evaluation of dysuria.  Patient states she has been experiencing burning sensation with urination for the past week.  Additionally endorses some low back pain as well, particular on the left side.  Denies fever/chills, chest pain, shortness of breath, abdominal pain, nausea/vomiting, diarrhea, or lightheadedness/dizziness.  He states that she is not concerned for any STIs and does not want testing. ? ?History Limitations: No limitations. ? ?    ? ? ?Physical Exam  ?Triage Vital Signs: ?ED Triage Vitals [06/24/21 1108]  ?Enc Vitals Group  ?   BP (!) 140/96  ?   Pulse Rate 93  ?   Resp 18  ?   Temp 98 ?F (36.7 ?C)  ?   Temp Source Oral  ?   SpO2 93 %  ?   Weight 289 lb (131.1 kg)  ?   Height '5\' 4"'$  (1.626 m)  ?   Head Circumference   ?   Peak Flow   ?   Pain Score 10  ?   Pain Loc   ?   Pain Edu?   ?   Excl. in [place]?   ? ? ?Most recent vital signs: ?Vitals:  ? 06/24/21 1108 06/24/21 1507  ?BP: (!) 140/96 (!) 141/74  ?Pulse: 93 85  ?Resp: 18   ?Temp: 98 ?F (36.7 ?C) 98.5 ?F (36.9 ?C)  ?SpO2: 93% 97%  ? ? ?General: Awake, NAD.  ?Skin: Warm, dry. No rashes or lesions.  ?Eyes: PERRL. Conjunctivae normal.  ?CV: Good peripheral perfusion.  ?Resp: Normal effort.  ?Abd: Soft, non-tender. No distention.  ?Neuro: At baseline. No gross neurological deficits.  ? ?Focused Exam: Left-sided CVA tenderness present. ? ?Physical Exam ? ? ? ?ED Results / Procedures / Treatments  ?Labs ?(all labs ordered are listed, but only abnormal results are displayed) ?Labs Reviewed  ?URINALYSIS, ROUTINE W REFLEX MICROSCOPIC - Abnormal; Notable for the following components:  ?     Result Value  ? Color, Urine YELLOW (*)   ? APPearance CLOUDY (*)   ? Glucose, UA >=500 (*)   ? Hgb urine dipstick SMALL (*)   ? Protein, ur 30 (*)   ? Nitrite POSITIVE (*)   ? Leukocytes,Ua LARGE (*)   ? RBC / HPF >50 (*)   ? WBC, UA >50 (*)   ? Bacteria, UA MANY (*)   ? All other components within normal limits  ?COMPREHENSIVE METABOLIC PANEL - Abnormal; Notable for the following components:  ? Potassium 3.3 (*)   ? Glucose, Bld 207 (*)   ? All other components within normal limits  ?CBC WITH DIFFERENTIAL/PLATELET  ? ? ? ?EKG ?Not applicable. ? ? ?RADIOLOGY ? ?ED Provider Interpretation: I personally reviewed this, no nephrolithiasis present based on my interpretation. ? ?CT Renal Stone Study ? ?Result Date: 06/24/2021 ?CLINICAL DATA:  Nephrolithiasis EXAM: CT ABDOMEN AND PELVIS WITHOUT CONTRAST TECHNIQUE: Multidetector CT imaging of the abdomen and pelvis was performed following the standard protocol without IV contrast. RADIATION DOSE REDUCTION: This exam was performed according to the departmental dose-optimization program which includes automated exposure control, adjustment of the mA and/or kV according to  patient size and/or use of iterative reconstruction technique. COMPARISON:  November 13, 2019 and MRI of the abdomen from 20/12. FINDINGS: Lower chest: Basilar atelectasis and scarring without change. Hepatobiliary: Hepatic steatosis and lobular hepatic contours. Mild hepatomegaly. Post cholecystectomy without gross biliary duct distension. Pancreas: Normal contour without signs of inflammation. Spleen: Normal size and contour. Adrenals/Urinary Tract: Smooth renal contours. Mild perinephric stranding particularly on the LEFT. Stable well-circumscribed RIGHT adrenal lesion measuring 2.7 x 1.8 cm unchanged as far back is April 2012 and shown to represent an adrenal adenoma at that time. No follow-up recommended for this finding. Exophytic lesion arises from the posterior RIGHT kidney measuring 16 mm, 11  Hounsfield units shown to represent a cyst on prior imaging. No follow-up recommended for this finding. Renal sinus cysts on the LEFT also benign and without need for follow-up. No hydronephrosis, nephrolithiasis or ureteral calculi. No perinephric stranding. Phleboliths in the pelvis. Urinary bladder without adjacent stranding. Stomach/Bowel: No perigastric stranding. No small bowel dilation or signs of small bowel inflammation. Normal appendix. Stool filled colon without distension. Vascular/Lymphatic: Aortic atherosclerosis. No sign of aneurysm. Smooth contour of the IVC. There is no gastrohepatic or hepatoduodenal ligament lymphadenopathy. No retroperitoneal or mesenteric lymphadenopathy. No pelvic sidewall lymphadenopathy. Atherosclerotic changes are mild. Assessment of vascular structures limited by lack of intravenous contrast. Reproductive: Post hysterectomy without adnexal mass. Other: Moderate stranding about the ventral abdominal wall in the patient's pannus below the umbilicus, associated with skin thickening. This is increased compared to prior imaging. Small fat containing umbilical hernia. Musculoskeletal: No acute bone finding. No destructive bone process. Spinal degenerative changes. IMPRESSION: 1. No hydronephrosis, nephrolithiasis or ureteral calculi. 2. Mild LEFT perinephric stranding may be slightly increased since previous imaging correlate with any signs of pyelonephritis. 3. Moderate stranding about the ventral abdominal wall in the patient's pannus below the umbilicus, associated with skin thickening. This is increased compared to prior imaging. Correlate with any clinical evidence of panniculitis/cellulitis. 4. Hepatic steatosis and lobular hepatic contours. Mild hepatomegaly. 5. Aortic atherosclerosis. Aortic Atherosclerosis (ICD10-I70.0). Electronically Signed   By: Zetta Bills M.D.   On: 06/24/2021 12:32   ? ?PROCEDURES: ? ?Critical Care performed:  None. ? ?Procedures ? ? ? ?MEDICATIONS ORDERED IN ED: ?Medications  ?cefTRIAXone (ROCEPHIN) 1 g in sodium chloride 0.9 % 100 mL IVPB (0 g Intravenous Stopped 06/24/21 1510)  ? ? ? ?IMPRESSION / MDM / ASSESSMENT AND PLAN / ED COURSE  ?I reviewed the triage vital signs and the nursing notes. ?             ?               ? ?Differential diagnosis includes, but is not limited to, nephrolithiasis, cystitis, pyelonephritis. ? ?ED Course ?Patient appears well.  Vitals within normal limits.  NAD. ? ?CBC shows no leukocytosis or anemia.  CMP shows no remarkable electrolyte abnormalities. ? ?Urinalysis notable for positive nitrates, large leukocytes, and several WBC.  Highly suggestive of urinary tract infection. ? ?Assessment/Plan ?Presentation consistent with cystitis versus pyelonephritis.  Urinalysis positive for evidence of infection.  CT renal stone study shows no evidence of nephrolithiasis, however does show mild left perinephric stranding suggestive of pyelonephritis.  CT scan did show incidentally some moderate stranding around the ventral abdominal wall below the umbilicus suggestive of infection, however there is no evidence of cellulitis or infection around this area on exam.  Patient did note that she has had cellulitis and surgery in the past around the area in the  past, which likely explains these findings.  I believe patient is safe for discharge.  We will go ahead and treat her here with 1 g of ceftriaxone and provide her a prescription for levofloxacin and ondansetron.  Advised her to follow-up with her primary care provider within the next few days to ensure improvement in symptoms. ? ?Considered admission for this patient, but given the patient's stable presentation, reassuring lab work-up, she is unlikely to benefit. ? ?Provided the patient with anticipatory guidance, return precautions, and educational material. Encouraged the patient to return to the emergency department at any time if they begin to  experience any new or worsening symptoms. Patient expressed understanding and agreed with the plan.  ? ?  ? ? ?FINAL CLINICAL IMPRESSION(S) / ED DIAGNOSES  ? ?Final diagnoses:  ?Pyelonephritis  ? ? ? ?Rx / DC Orders  ? ?ED Discharge Orders   ?

## 2021-06-24 NOTE — Telephone Encounter (Signed)
? ?  Telephone encounter was:  Successful.  ?06/24/2021 ?Name: ALYSIA SCISM MRN: [id number] DOB: 04-24-1955 ? ?YUMA BLUCHER is a 66 y.o. year old female who is a primary care patient of Sagardia, Ines Bloomer, MD . The community resource team was consulted for assistance with Transportation Needs  and Food Insecurity ? ?Care guide performed the following interventions: Patient provided with information about care guide support team and interviewed to confirm resource needs.follow up for mailed resources patient did receive the resources ? ? ?Follow Up Plan:  No further follow up planned at this time. The patient has been provided with needed resources. ? ? ? ?Larena Sox ?Care Guide, Embedded Care Coordination ?[facility], Care Management  ?[phone] ?[address], [place], [place] [address] ?Phone: [phone] ?Email: [email or web address] ? ?  ?

## 2021-06-24 NOTE — Discharge Instructions (Addendum)
-  Take all of antibiotics as prescribed. ?-Treat pain with Tylenol as needed.  Utilize hydrocodone sparingly.  You may take ondansetron as needed for nausea ?-Follow-up with your regular doctor within the next couple days to ensure improvement in symptoms. ?-Return to the emergency department anytime if you begin to experience any new or worsening symptoms. ?

## 2021-06-24 NOTE — ED Notes (Signed)
See triage note. Pt ambulatory to triage. Pt reports UTI like symptoms and taking OTC medication at home with no relief.  ?

## 2021-07-01 ENCOUNTER — Telehealth: Payer: Self-pay | Admitting: Emergency Medicine

## 2021-07-01 NOTE — Telephone Encounter (Signed)
Patients depression screen showed moderately to severe depression - this is for your information only. ?

## 2021-07-01 NOTE — Telephone Encounter (Signed)
Thank you :)

## 2021-07-14 ENCOUNTER — Ambulatory Visit: Payer: Medicare Other | Admitting: Cardiology

## 2021-07-17 ENCOUNTER — Other Ambulatory Visit: Payer: Self-pay | Admitting: Emergency Medicine

## 2021-07-17 DIAGNOSIS — F411 Generalized anxiety disorder: Secondary | ICD-10-CM

## 2021-07-17 DIAGNOSIS — F3178 Bipolar disorder, in full remission, most recent episode mixed: Secondary | ICD-10-CM

## 2021-07-22 ENCOUNTER — Emergency Department
Admission: EM | Admit: 2021-07-22 | Discharge: 2021-07-22 | Disposition: A | Discharge status: Home / Self Care | Payer: Medicare Other | Attending: Emergency Medicine | Admitting: Emergency Medicine

## 2021-07-22 ENCOUNTER — Emergency Department: Payer: Medicare Other

## 2021-07-22 ENCOUNTER — Other Ambulatory Visit: Payer: Self-pay

## 2021-07-22 DIAGNOSIS — D72829 Elevated white blood cell count, unspecified: Secondary | ICD-10-CM | POA: Insufficient documentation

## 2021-07-22 DIAGNOSIS — L03116 Cellulitis of left lower limb: Secondary | ICD-10-CM | POA: Diagnosis not present

## 2021-07-22 DIAGNOSIS — R300 Dysuria: Secondary | ICD-10-CM | POA: Diagnosis present

## 2021-07-22 DIAGNOSIS — E1165 Type 2 diabetes mellitus with hyperglycemia: Secondary | ICD-10-CM | POA: Diagnosis not present

## 2021-07-22 DIAGNOSIS — N3090 Cystitis, unspecified without hematuria: Secondary | ICD-10-CM | POA: Insufficient documentation

## 2021-07-22 DIAGNOSIS — I10 Essential (primary) hypertension: Secondary | ICD-10-CM | POA: Insufficient documentation

## 2021-07-22 DIAGNOSIS — M7989 Other specified soft tissue disorders: Secondary | ICD-10-CM | POA: Diagnosis not present

## 2021-07-22 LAB — URINALYSIS, ROUTINE W REFLEX MICROSCOPIC
Bacteria, UA: NONE SEEN
Bilirubin Urine: NEGATIVE
Glucose, UA: NEGATIVE mg/dL
Ketones, ur: NEGATIVE mg/dL
Nitrite: NEGATIVE
Protein, ur: NEGATIVE mg/dL
Specific Gravity, Urine: 1.025 (ref 1.005–1.030)
WBC, UA: >50 WBC/hpf — ABNORMAL HIGH (ref 0–5)
pH: 5 (ref 5.0–8.0)

## 2021-07-22 LAB — BASIC METABOLIC PANEL
Anion gap: 11 (ref 5–15)
BUN: 17 mg/dL (ref 8–23)
CO2: 29 mmol/L (ref 22–32)
Calcium: 9.4 mg/dL (ref 8.9–10.3)
Chloride: 98 mmol/L (ref 98–111)
Creatinine, Ser: 0.62 mg/dL (ref 0.44–1.00)
GFR, Estimated: >60 mL/min (ref >60)
Glucose, Bld: 206 mg/dL — ABNORMAL HIGH (ref 70–99)
Potassium: 3.4 mmol/L — ABNORMAL LOW (ref 3.5–5.1)
Sodium: 138 mmol/L (ref 135–145)

## 2021-07-22 LAB — CBC
HCT: 41.3 % (ref 36.0–46.0)
Hemoglobin: 13.3 g/dL (ref 12.0–15.0)
MCH: 27.3 pg (ref 26.0–34.0)
MCHC: 32.2 g/dL (ref 30.0–36.0)
MCV: 84.8 fL (ref 80.0–100.0)
Platelets: 210 10*3/uL (ref 150–400)
RBC: 4.87 MIL/uL (ref 3.87–5.11)
RDW: 14.7 % (ref 11.5–15.5)
WBC: 7.4 10*3/uL (ref 4.0–10.5)
nRBC: 0 % (ref 0.0–0.2)

## 2021-07-22 MED ORDER — SODIUM CHLORIDE 0.9 % IV BOLUS
1000.0000 mL | Freq: Once | INTRAVENOUS | Status: AC
  Administered 2021-07-22: 1000 mL via INTRAVENOUS

## 2021-07-22 MED ORDER — HYDROCODONE-ACETAMINOPHEN 5-325 MG PO TABS: 1.0000 | ORAL_TABLET | Freq: Four times a day (QID) | ORAL | 0 refills | Status: AC | PRN

## 2021-07-22 MED ORDER — CEPHALEXIN 500 MG PO CAPS: 500.0000 mg | ORAL_CAPSULE | Freq: Four times a day (QID) | ORAL | 0 refills | Status: AC

## 2021-07-22 NOTE — ED Triage Notes (Signed)
Pt c/o painful urination with foul odor urine , states she was seen here a month ago for the same and was doing better but then in the past week worsening again , pt also c/o swollen painful area to the right buttock  ?

## 2021-07-22 NOTE — ED Notes (Signed)
Pt in with multiple complaints. Reports was tx a month ago for a bladder and kidney infection; states finished antibiotics and felt nearly better but then got worse; reports intermittent fever; reports tick bite to leg; no bite mark or spot of irritation noted to pt's legs; c/o L ankle swelling; L ankle swelling noted; no pitting at L ankle; L ankle appropriate in color and warmth; c/o R buttock pain. Pt ambulatory to room. Pt in NAD; skin dry; resp reg/unlabored; calm.  ?

## 2021-07-22 NOTE — ED Notes (Signed)
Pt up to restroom.

## 2021-07-22 NOTE — ED Notes (Signed)
Called in lobby/flex wait x1 by Jacques Navy.  ?

## 2021-07-22 NOTE — Discharge Instructions (Addendum)
-  Take all antibiotics as prescribed. ?-He may treat pain with Tylenol/ibuprofen as needed.  Utilize hydrocodone sparingly. ?-Follow-up with your primary care provider within the next few days to ensure improvement in symptoms ?-Return to the emergency department anytime if you begin to experience any new or worsening symptoms. ?

## 2021-07-22 NOTE — ED Notes (Signed)
Pt found in lobby coming out of restroom; pt stated she attempted to provide urine sample but missed the specimen cup. Pt agrees to notify this RN when she can attempt for sample again.  ?

## 2021-07-22 NOTE — ED Provider Notes (Signed)
[facility] Provider Note    Event Date/Time   First MD Initiated Contact with Patient 07/22/21 1019     (approximate)   History   Chief Complaint Urinary Tract Infection and Abscess   HPI Rachel Luna is a 66 y.o. female, history of hypertension, depression, seizure disorder, bipolar 1, GAD, hyperlipidemia, arthritis, diabetes, presents the emergency department for evaluation of dysuria.  Patient states that she was recently seen here 1 month prior for pyelonephritis in which she received antibiotics.  She says that she got better with the antibiotics, although now has been experiencing recurrent dysuria over the past week.  Additionally, she has been experiencing pain and redness in her left lower extremity, below the calf.  She has additionally been experiencing pain along her buttocks, right side.  Denies fever/chills, chest pain, shortness of breath, abdominal pain, nausea/vomiting, diarrhea, dizziness/lightheadedness.   History Limitations: No limitations.        Physical Exam  Triage Vital Signs: ED Triage Vitals  Enc Vitals Group     BP 07/22/21 1000 (!) 141/75     Pulse Rate 07/22/21 1000 91     Resp 07/22/21 1000 18     Temp 07/22/21 1000 98.8 F (37.1 C)     Temp Source 07/22/21 1000 Oral     SpO2 07/22/21 1000 97 %     Weight 07/22/21 0959 280 lb (127 kg)     Height 07/22/21 1026 '5\' 4"'$  (1.626 m)     Head Circumference --      Peak Flow --      Pain Score 07/22/21 0958 10     Pain Loc --      Pain Edu? --      Excl. in [place]? --     Most recent vital signs: Vitals:   07/22/21 1000 07/22/21 1255  BP: (!) 141/75 138/82  Pulse: 91 95  Resp: 18 18  Temp: 98.8 F (37.1 C)   SpO2: 97% 98%    General: Awake, appears anxious. Skin: Warm, dry. No rashes or lesions.  Eyes: PERRL. Conjunctivae normal.  CV: Good peripheral perfusion.  Resp: Normal effort.  Abd: Soft, non-tender. No distention.  No CVA tenderness present. Neuro: At  baseline. No gross neurological deficits.   Focused Exam: Left lower extremity appears diffusely swollen along the left foot and left lower leg.  Erythema present along the calf extending down to the ankle.  Pulse, motor, sensation intact distally.   No gross deformities present to the buttocks.  No localized region of erythema or fluctuance.  Tenderness when palpating the center of the right gluteal region.     Physical Exam    ED Results / Procedures / Treatments  Labs (all labs ordered are listed, but only abnormal results are displayed) Labs Reviewed  BASIC METABOLIC PANEL - Abnormal; Notable for the following components:      Result Value   Potassium 3.4 (*)    Glucose, Bld 206 (*)    All other components within normal limits  URINALYSIS, ROUTINE W REFLEX MICROSCOPIC - Abnormal; Notable for the following components:   Color, Urine YELLOW (*)    APPearance CLOUDY (*)    Hgb urine dipstick SMALL (*)    Leukocytes,Ua LARGE (*)    WBC, UA >50 (*)    All other components within normal limits  CBC     EKG N/A.   RADIOLOGY  ED Provider Interpretation: I personally reviewed and interpreted this ultrasound.  No evidence of DVT.  US Venous Img Lower  Left (DVT Study)  Result Date: 07/22/2021 CLINICAL DATA:  Left lower extremity swelling EXAM: LEFT LOWER EXTREMITY VENOUS DOPPLER ULTRASOUND TECHNIQUE: Gray-scale sonography with graded compression, as well as color Doppler and duplex ultrasound were performed to evaluate the lower extremity deep venous systems from the level of the common femoral vein and including the common femoral, femoral, profunda femoral, popliteal and calf veins including the posterior tibial, peroneal and gastrocnemius veins when visible. The superficial great saphenous vein was also interrogated. Spectral Doppler was utilized to evaluate flow at rest and with distal augmentation maneuvers in the common femoral, femoral and popliteal veins. COMPARISON:   07/29/2017 FINDINGS: Contralateral Common Femoral Vein: Respiratory phasicity is normal and symmetric with the symptomatic side. No evidence of thrombus. Normal compressibility. Common Femoral Vein: No evidence of thrombus. Normal compressibility, respiratory phasicity and response to augmentation. Saphenofemoral Junction: No evidence of thrombus. Normal compressibility and flow on color Doppler imaging. Profunda Femoral Vein: No evidence of thrombus. Normal compressibility and flow on color Doppler imaging. Femoral Vein: No evidence of thrombus. Normal compressibility, respiratory phasicity and response to augmentation. Popliteal Vein: No evidence of thrombus. Normal compressibility, respiratory phasicity and response to augmentation. Calf Veins: No evidence of thrombus. Normal compressibility and flow on color Doppler imaging. Superficial Great Saphenous Vein: No evidence of thrombus. Normal compressibility. Venous Reflux:  None. Other Findings:  None. IMPRESSION: No evidence of left lower extremity deep venous thrombosis. Electronically Signed   By: Davina Poke D.O.   On: 07/22/2021 13:47    PROCEDURES:  Critical Care performed: N/A.  Procedures    MEDICATIONS ORDERED IN ED: Medications  sodium chloride 0.9 % bolus 1,000 mL (0 mLs Intravenous Stopped 07/22/21 1357)     IMPRESSION / MDM / ASSESSMENT AND PLAN / ED COURSE  I reviewed the triage vital signs and the nursing notes.                              Differential diagnosis includes, but is not limited to, cellulitis, cystitis, pyelonephritis, DVT.   ED Course Patient appears anxious, but clinically stable.  Vitals are within normal limits.  NAD.  CBC shows no leukocytosis or anemia.  BMP shows elevated glucose at 206, consistent with her history of diabetes.  Otherwise no significant electrolyte abnormalities.  Will initiate IV fluids.  Urinalysis notable for large leukocytes and small hemoglobin.  Given her symptoms, likely  urinary tract infection.  Assessment/Plan Presentation consistent with cystitis.  History and physical exam not suggestive of pyelonephritis at this time.  She does additionally have what appears to be cellulitis along the left lower extremity.  Ultrasound reassuring for no evidence of DVT. we will provide her with a prescription for cephalexin to cover for both infections.  We will additionally provide her with a short-term supply of hydrocodone/acetaminophen for short-term pain control of the left lower extremity. She is clinically stable at this time.  We will plan to discharge.  Recommended that she follow-up with her primary care provider within the next few days to ensure improvement in symptoms.  Considered admission for this patient, but given the patient's stable presentation and reassuring work-up, she is unlikely to benefit from admission.  Provided the patient with anticipatory guidance, return precautions, and educational material. Encouraged the patient to return to the emergency department at any time if they begin to experience any new or worsening symptoms. Patient  expressed understanding and agreed with the plan.       FINAL CLINICAL IMPRESSION(S) / ED DIAGNOSES   Final diagnoses:  Cystitis  Cellulitis of left lower extremity     Rx / DC Orders   ED Discharge Orders          Ordered    cephALEXin (KEFLEX) 500 MG capsule  4 times daily        07/22/21 1405    HYDROcodone-acetaminophen (NORCO/VICODIN) 5-325 MG tablet  Every 6 hours PRN        07/22/21 1406             Note:  This document was prepared using Dragon voice recognition software and may include unintentional dictation errors.   Teodoro Spray, [place] 07/22/21 1558    Vanessa Cowpens, MD 07/23/21 1113

## 2021-07-22 NOTE — ED Notes (Signed)
Lactic sent if needed ?

## 2021-07-28 ENCOUNTER — Ambulatory Visit (INDEPENDENT_AMBULATORY_CARE_PROVIDER_SITE_OTHER): Payer: Medicare Other | Admitting: *Deleted

## 2021-07-28 DIAGNOSIS — E119 Type 2 diabetes mellitus without complications: Secondary | ICD-10-CM

## 2021-07-28 DIAGNOSIS — E1159 Type 2 diabetes mellitus with other circulatory complications: Secondary | ICD-10-CM

## 2021-07-28 NOTE — Chronic Care Management (AMB) (Signed)
Chronic Care Management   CCM RN Visit Note  07/28/2021 Name: Rachel Luna MRN: [id number] DOB: [birth year]  Subjective: Rachel Luna is a 66 y.o. year old female who is a primary care patient of Mitchel Honour, Ines Bloomer, MD. The care management team was consulted for assistance with disease management and care coordination needs.    Engaged with patient by telephone for follow up visit in response to provider referral for case management and/or care coordination services.   Consent to Services:  The patient was given information about Chronic Care Management services, agreed to services, and gave verbal consent prior to initiation of services.  Please see initial visit note for detailed documentation.  Patient agreed to services and verbal consent obtained.   Assessment: Review of patient past medical history, allergies, medications, health status, including review of consultants reports, laboratory and other test data, was performed as part of comprehensive evaluation and provision of chronic care management services.   SDOH (Social Determinants of Health) assessments and interventions performed:  SDOH Interventions    Flowsheet Row Most Recent Value  SDOH Interventions   Transportation Interventions Intervention Not Indicated  [confirmed patient has been provided with transportation resources bby [employer] team- she has not yet utilized resources,  reports has car with insurance but no drivers license- advised not to drive until she has license]     CCM Care Plan  Allergies  Allergen Reactions   Belsomra [Suvorexant] Other (See Comments)    Vivid dreams   Wellbutrin [Bupropion] Other (See Comments)    Irritability and Agitation   Geodon [Ziprasidone Hcl] Other (See Comments)    Worsened Restless Leg Syndrome   Lisinopril Cough   Neupro [Rotigotine] Rash   Outpatient Encounter Medications as of 07/28/2021  Medication Sig   ACCU-CHEK AVIVA PLUS test strip  Use in the morning and after meals as needed.   baclofen (LIORESAL) 10 MG tablet One tablet twice during the day and 2 at night   carbamazepine (TEGRETOL) 200 MG tablet Take 0.5 tablets (100 mg total) by mouth daily.   cephALEXin (KEFLEX) 500 MG capsule Take 1 capsule (500 mg total) by mouth 4 (four) times daily for 10 days.   cetirizine (ZYRTEC) 10 MG tablet Take 10 mg by mouth daily.   clonazePAM (KLONOPIN) 0.5 MG tablet Take 1 tablet (0.5 mg total) by mouth at bedtime.   DULoxetine (CYMBALTA) 30 MG capsule TAKE 1 CAPSULE DAILY TO BE COMBINED WITH $RemoveBefor'60MG'HxKtzJAxiDXl$  CAPSULE   DULoxetine (CYMBALTA) 60 MG capsule TAKE 1 CAPSULE BY MOUTH ONCE DAILY - TAKE WITH $RemoveB'30MG'eDRZJHgG$  CAPSULE   estradiol (ESTRACE) 0.5 MG tablet Take 1 tablet (0.5 mg total) by mouth daily.   gabapentin (NEURONTIN) 800 MG tablet TAKE 1 TAKE THREE TIMES A DAY   JANUVIA 100 MG tablet TAKE 1 TABLET BY MOUTH ONCE DAILY   losartan-hydrochlorothiazide (HYZAAR) 100-12.5 MG tablet Take 1 tablet by mouth daily.   meloxicam (MOBIC) 15 MG tablet Take 15 mg by mouth daily.   omeprazole (PRILOSEC) 20 MG capsule Take 20 mg by mouth as needed (heartburn).   ondansetron (ZOFRAN-ODT) 4 MG disintegrating tablet Take 1 tablet (4 mg total) by mouth every 8 (eight) hours as needed for nausea or vomiting.   pramipexole (MIRAPEX) 0.75 MG tablet One tablet in the morning, 3 at night   propranolol (INDERAL) 20 MG tablet Take 1 tablet (20 mg total) by mouth 2 (two) timesdaily.   rosuvastatin (CRESTOR) 40 MG tablet Take 1 tablet (40 mg  total) by mouth daily.   No facility-administered encounter medications on file as of 23-Aug-2021.   Patient Active Problem List   Diagnosis Date Noted   Palpitations 10/30/2020   Panic disorder 10/30/2020   Syncope and collapse 10/27/2020   Benzodiazepine dependence in remission ([place]) 03/17/2020   Morbid obesity ([place]) 03/17/2020   Adrenal mass ([place]) 03/17/2020   PAD (peripheral artery disease) ([place]) 02/18/2020   Chronic venous  insufficiency 02/18/2020   Hyperlipidemia 02/18/2020   Bipolar disorder, in full remission, most recent episode mixed ([place]) 01/03/2020   Attention deficit 01/03/2020   Aortic atherosclerosis ([place]) 11/20/2019   Restless leg syndrome 02/15/2019   Bipolar I disorder, most recent episode mixed ([place]) 09/14/2018   GAD (generalized anxiety disorder) 09/14/2018   Insomnia due to mental disorder 09/14/2018   Diabetes ([place]) 07/13/2018   Seizure disorder ([place]) 10/19/2017   Osteoarthritis of ankle and foot 03/27/2015   ANA positive 03/05/2015   Elevated rheumatoid factor 03/05/2015   Hypertension associated with diabetes ([place]) 09/04/2014   Avitaminosis D 09/04/2014   Chronic low back pain 02/18/2014   Menopausal hot flushes 08/14/2012   Arthritis of knee, degenerative 04/06/2011   Benign essential HTN 07/13/2010   Essential (primary) hypertension 07/13/2010   Insomnia, unspecified 07/13/2010   Impaired fasting glucose 07/13/2010   Chronic pain 01/08/2010   Affective disorder, major 11/05/2009   Major depressive disorder 11/05/2009   Major depressive disorder, single episode, unspecified 11/05/2009   Conditions to be addressed/monitored:  HTN and DMII  Care Plan : RN Care Manager Plan of Care  Updates made by Rachel Royalty, RN since 08-23-21 12:00 AM     Problem: Chronic Disease Management Needs   Priority: High     Long-Range Goal: Ongoing progression of plan of care for long term chronic disease management   Start Date: 01/20/2021  Expected End Date: 01/20/2022  Priority: High  Note:   Current Barriers:  Chronic Disease Management support and education needs related to HTN and DMII in setting of chronic depression/ anxiety 02/03/21- patient reported 01/20/21 loss of her roommate/ friend of 40 years; reports remains active with psychiatric provider she is well established with: today, she reports she believes she is progressing through her grief well- depression screen  updated 05/28/21: continues to report regularly followed by her counselor/ psychiatric provider; reports she believes she "is doing much better;" and is "stable" in recovering from her grief around the recent death of her roommate 08-23-21: reports continues established with psychiatric provider/ counselor; reports last virtual visit as "yesterday" with next scheduled visit 08/07/21 Patient consistently non-adherent to monitoring blood sugars/ blood pressures at home; lack of motivation in self-care SDOH needs- transportation and food acquisition; community resource care guide referral placed 05/28/21 08-23-2021: confirmed [employer] outreached patient and provided resources- she has not yet utilized resources that were provided; she continues to tell me that she has an insured vehicle but no drivers license: she has been occasionally driving despite not having a drivers license- I advised today to please not drive without a drivers license Increase in T7-G from 6.9 (10/30/21) to 8.6 (06/17/21): patient confirms she is still not monitoring blood sugars at home; confirmed by review of EHR- no changes to current medications as a result of increased  A1-C 2 recent ED visits 06/24/21 and 07/22/21- pyelonephritis and then cystitis- discharged home after MSE with antibiotics and pain medication  RNCM Clinical Goal(s):  Patient will demonstrate Improved health management independence HTN; DMII; anxiety/ depression  through collaboration with Consulting civil engineer, provider, and care team.   Interventions: 1:1 collaboration with primary care provider regarding development and update of comprehensive plan of care as evidenced by provider attestation and co-signature Inter-disciplinary care team collaboration (see longitudinal plan of care) Evaluation of current treatment plan related to  self management and patient's adherence to plan as established by provider Review of patient status, including review of  consultants reports, relevant laboratory and other test results, and medications completed SDOH updated: ongoing concerns addressed, as above per barriers; patient has not yet utilized resources previously provided by [employer] Medications discussed: reports continues to independently self-manage and denies current concerns/ issues/ questions around medications; endorses adherence to taking all medications as prescribed Reviewed recent ED visits with patient: she reports currently taking prescribed antibiotics; considering making appointment with urology provider- this was encouraged if she continues having concerns around pyelonephritis and cystitis/ UTI; she tells me that she was told by ED team that her "kidney cyst" is "much worse;" however, I am unable to verify this today by review of EHR Reviewed upcoming scheduled provider appointments: 10/28/21- neurology provider; 12/16/21- PCP; patient confirms is aware of all and has plans to attend as scheduled Reports she cancelled appointments with cardiology and vision providers due to feeling bad with pyelonephritis- encouraged her to re-schedule as soon as possible Discussed plans with patient for ongoing care management follow up and provided patient with direct contact information for care management team      Hypertension Interventions: Goal status: 07/28/21: On Track; Long Term goal Last practice recorded BP readings:  BP Readings from Last 3 Encounters:  07/22/21 138/82  06/24/21 (!) 141/74  06/17/21 130/72  Most recent eGFR/CrCl: No results found for: EGFR  No components found for: CRCL  Evaluation of current treatment plan related to hypertension self management and patient's adherence to plan as established by provider Discussed complications of poorly controlled blood pressure such as heart disease, stroke, circulatory complications, vision complications, kidney impairment, sexual dysfunction Confirmed has still not been  monitoring/ recording blood pressures at home: she remains lacking in motivation and states she does not plan to start BP monitoring at home, however, I again discussed benefit of monitoring at home, for her own knowledge/ empowerment Reinforced previously provided education around importance of following prescribed diet: heart healthy, low salt, carbohydrate modified; low sugar Confirmed patient "tries" to follow diet for low carbohydrate/ low sugar/ heart healthy/ low salt: reports difficulty affording food in light of current economy/ grocery prices: community resource care guide referral placed, as above; patient has not yet acted to follow up on provided resources  Diabetes Interventions: Goal Status: 07/28/21: On Track; Long Term goal  Provided education to patient about basic DM disease process Counseled on importance of regular laboratory monitoring as prescribed Re- confirmed patient has still not been consistently monitoring/ recording blood sugars at home- remains not motivated; again encouraged patient to consider monitoring/ recording at home in the future  Reviewed recent PCP office visit 06/17/21 with patient: we reviewed her lab work, including increased A1-C, and she attributes the increase to "being under stress" after the passing of her roommate in November, although she has also told me consistently that she believes she is managing her grief well- encouraged her ongoing engagement with established counselor Assessed patient's understanding of meaning/ significance of A1-C values: ongoing fair baseline understanding of same- Reviewed individual historical A1-C trends and provided education around correlation of A1-C value to blood sugar levels  at home over 3 months Discussed with patient value of monitoring blood sugars at home to stay on track and assist her care team providers in making sure her medications are optimized; she again tells me that she will "consider and try to start"  monitoring (at least) fasting blood sugars, "maybe every other day;" at this point, I encouraged her to just begin monitoring Reinforced previously provided education around long term complications/ risks of ongoing high blood sugars  Lab Results  Component Value Date   HGBA1C 8.6 (H) 06/17/2021  Patient Goals/Self-Care Activities: As evidenced by review of EHR, collaboration with care team, and patient reporting during CCM RN CM outreach,  Patient Elyanna  will: Take medications as prescribed Attend all scheduled provider appointments Call pharmacy for medication refills Call provider office for new concerns or questions Continue actively engaging with your psychiatric counselor as you have been Please consider checking and writing down on paper fasting (first thing in the morning, before eating) blood sugars at home Your A1-C result has increased significantly from your last value, which was 6.9 (10/30/20)- 6.9 is roughly an average blood sugar over 3 months of 145 Your most recent A1-C on 06/17/21 was 8.6, which is roughly an average blood sugar over 3 months of 200 Monitoring your blood sugar at home is the best way to know where your blood sugar is running and how well your medications are working Please consider occasionally checking your blood pressures at home- please write down your blood pressures when you check them Continue to follow heart healthy, low salt, low cholesterol, carbohydrate-modified, low sugar diet Keep up the great work preventing falls Please do not drive your vehicle until you have a drivers license  Follow Up Plan:   Telephone follow up appointment with care management team member scheduled for:  Monday, August 31, 2021 at 9:00 am The patient has been provided with contact information for the care management team and has been advised to call with any health related questions or concerns      Plan: The patient has been provided with contact information for the  care management team and has been advised to call with any health related questions or concerns   [name], RN, BSN, [place] [phone]: direct office

## 2021-07-28 NOTE — Patient Instructions (Signed)
Visit Information  Chyrel, thank you for taking time to talk with me today. Please don't hesitate to contact me if I can be of assistance to you before our next scheduled telephone appointment  Below are the goals we discussed today:  Patient Self-Care Activities: [name]  will: Take medications as prescribed Attend all scheduled provider appointments Call pharmacy for medication refills Call provider office for new concerns or questions Continue actively engaging with your psychiatric counselor as you have been Please consider checking and writing down on paper fasting (first thing in the morning, before eating) blood sugars at home Your A1-C result has increased significantly from your last value, which was 6.9 (10/30/20)- 6.9 is roughly an average blood sugar over 3 months of 145 Your most recent A1-C on 06/17/21 was 8.6, which is roughly an average blood sugar over 3 months of 200 Monitoring your blood sugar at home is the best way to know where your blood sugar is running and how well your medications are working Please consider occasionally checking your blood pressures at home- please write down your blood pressures when you check them Continue to follow heart healthy, low salt, low cholesterol, carbohydrate-modified, low sugar diet Keep up the great work preventing falls Please do not drive your vehicle until you have a drivers license  Our next scheduled telephone follow up visit/ appointment is scheduled on: Monday, August 31, 2021 at 9:00 am- This is a PHONE [place] appointment  If you need to cancel or re-schedule our visit, please call [phone] and our care guide team will be happy to assist you.   I look forward to hearing about your progress.   [name], RN, BSN, [place] [phone]: direct office  If you are experiencing a Mental Health or [place] or need someone to talk to,  please  call the Suicide and Crisis Lifeline: 988 call the [place] National Suicide Prevention Lifeline: 6141196588 or TTY: 340-851-7060 TTY 213-206-8952) to talk to a trained counselor call 1-800-273-TALK (toll free, 24 hour hotline) go to [facility] [address], [place] [id number]) call 911   The patient verbalized understanding of instructions, educational materials, and care plan provided today and agreed to receive a mailed copy of patient instructions, educational materials, and care plan  Antibiotic Medicine, Adult  Antibiotic medicines treat infections caused by a type of germ called bacteria. These medicines work by killing the bacteria that make you sick. You should take antibiotic medicines safely and only when needed. When do I need to take antibiotics? You may need antibiotics for: A urinary tract infection (UTI). Strep throat. A sinus infection caused by bacteria. Meningitis. This affects the spinal cord and brain. A bad lung infection. You may start your medicines while your doctor waits for your results on some tests. When your results come back, your doctor may change or stop your medicine based on your test results. When are antibiotics not needed? You do not need these medicines for most common illnesses, such as: A cold. The flu. A sore throat. Mucus being an odd color. Bronchitis. Sometimes, antibiotics are not needed for an infection caused by bacteria. Do not ask for these medicines, or take them, when they are not needed. How long should I take my antibiotic? You need to take all your medicine. Take your antibiotic medicine as told by your doctor. Do not stop taking the antibiotic even if you start  to feel better. If you stop taking it too soon: You may feel sick again. Your infection may get harder to treat. Antibiotics need different amounts of time to work. Some treatments last just a few days. Some last about  a week to 10 days. Sometimes, you may need to take antibiotics for a few weeks to fully treat your infection. What if I miss a dose? Try not to miss a dose. If you miss a dose, call your doctor or pharmacist. Sometimes, it is okay to take the missed dose as soon as you can. Do not take an extra dose. What are the risks of taking antibiotics? Antibiotics can cause: Allergic attacks. A feeling like you may vomit (nausea). Yeast infections. Liver problems. These medicines can cause an infection called C. diff. This causes watery poop (diarrhea). This happens when antibiotics kill good germs in your gut. This lets C. diff grow. Tell your doctor right away if: You get watery poop while taking your antibiotic. You get watery poop after you stop your antibiotic. C. diff can happen weeks after you stop your medicine. You also have a risk of getting an infection in the future that antibiotics cannot treat (antibiotic-resistant infection). These infections can get very bad. Sometimes, they can be life-threatening. Do antibiotics affect birth control? Birth control pills may not work. If you take birth control pills: Keep taking them as normal. Use a second form of birth control, such as a condom. Do this for as long as told by your doctor. What else should I know about taking antibiotics? You need to take these medicines exactly as told. Make sure to do these things: Take the right amount of medicine at the same time each day. Ask your doctor: How long to wait between doses. If you should take your medicine with food. If you should stay away from some foods, drinks, or medicines. What side effects you should watch for. Use only the medicines that your doctor said to use. Do not use medicines that were given to someone else. Drink a large glass of water when you take your medicine. Drink enough fluid to keep your pee (urine) pale yellow. Ask your pharmacist for a tool to measure your medicine. This  may be a syringe, cup, or spoon. Throw out any extra medicine. Follow these instructions at home: Take over-the-counter and prescription medicines as told by your doctor. Return to your normal activities as told by your doctor. Ask your doctor what activities are safe for you. Keep all follow-up visits as told by your doctor. This is important. Contact a doctor if: You feel worse. You have one of these after you start your medicine: New joint pain. New muscle aches. You have side effects from your medicine, such as: Stomach pain. Watery poop. Feeling like you may vomit. White patches in your mouth or throat. Get help right away if: You have a very bad allergic attack. If this happens, stop taking your medicine right away. You may get: Hives. These are raised, itchy, red bumps on your skin. Skin rash. Trouble breathing. Breathing that has whistling sounds. Swelling on your body. A dizzy feeling. Vomiting. You have symptoms of liver problems. You may have: Dark pee, or pee that is the color of blood. Yellow skin. Easy bruising. Easy bleeding. You have very bad watery poop. You have cramps in your belly. You have a very bad headache. These symptoms may be an emergency. Do not wait to see if the symptoms will go  away. Get medical help right away. Call your local emergency services (911 in the [place] Do not drive yourself to the hospital. Summary Antibiotics are used to treat infections caused by bacteria. Take these medicines safely and only when needed. Your doctor may change or stop your medicine based on your test results. Take all your medicine even when you feel better. This information is not intended to replace advice given to you by your health care provider. Make sure you discuss any questions you have with your health care provider. Document Revised: 12/12/2018 Document Reviewed: 12/12/2018 Elsevier Patient Education  [place].

## 2021-07-30 ENCOUNTER — Other Ambulatory Visit: Payer: Self-pay | Admitting: *Deleted

## 2021-07-30 ENCOUNTER — Other Ambulatory Visit: Payer: Self-pay | Admitting: Emergency Medicine

## 2021-07-30 ENCOUNTER — Telehealth: Payer: Self-pay | Admitting: Emergency Medicine

## 2021-07-30 MED ORDER — DULOXETINE HCL 30 MG PO CPEP: ORAL_CAPSULE | ORAL | 0 refills | Status: DC

## 2021-07-30 NOTE — Telephone Encounter (Signed)
New prescription for Duloxetine '30mg'$  sent over to pharmacy on file

## 2021-07-30 NOTE — Telephone Encounter (Signed)
Pharmacy called and states they faxed over refill request for Duloxetine 30 mg and it was denied because MD states it was sent over on 07/15/21.   That request was for the '60mg'$ - and the pt is taking both.   Please send refill in ASAP.   [place], [place] - [place] Phone:  [phone]  Fax:  [phone]

## 2021-08-05 DIAGNOSIS — Z7984 Long term (current) use of oral hypoglycemic drugs: Secondary | ICD-10-CM

## 2021-08-05 DIAGNOSIS — I10 Essential (primary) hypertension: Secondary | ICD-10-CM

## 2021-08-05 DIAGNOSIS — E1159 Type 2 diabetes mellitus with other circulatory complications: Secondary | ICD-10-CM

## 2021-08-26 ENCOUNTER — Other Ambulatory Visit: Payer: Self-pay | Admitting: Emergency Medicine

## 2021-08-26 DIAGNOSIS — F410 Panic disorder [episodic paroxysmal anxiety] without agoraphobia: Secondary | ICD-10-CM

## 2021-08-26 DIAGNOSIS — E1165 Type 2 diabetes mellitus with hyperglycemia: Secondary | ICD-10-CM

## 2021-08-31 ENCOUNTER — Ambulatory Visit (INDEPENDENT_AMBULATORY_CARE_PROVIDER_SITE_OTHER): Payer: Medicare Other | Admitting: *Deleted

## 2021-08-31 DIAGNOSIS — E119 Type 2 diabetes mellitus without complications: Secondary | ICD-10-CM

## 2021-08-31 DIAGNOSIS — E1159 Type 2 diabetes mellitus with other circulatory complications: Secondary | ICD-10-CM

## 2021-08-31 NOTE — Chronic Care Management (AMB) (Signed)
Chronic Care Management   CCM RN Visit Note  08/31/2021 Name: Rachel Luna MRN: [id number] DOB: 1955-06-04  Subjective: Rachel Luna is a 66 y.o. year old female who is a primary care patient of Mitchel Honour, Ines Bloomer, MD. The care management team was consulted for assistance with disease management and care coordination needs.    Engaged with patient by telephone for follow up visit/ case closure in response to provider referral for case management and/or care coordination services.   Consent to Services:  The patient was given information about Chronic Care Management services, agreed to services, and gave verbal consent prior to initiation of services.  Please see initial visit note for detailed documentation.  Patient agreed to services and verbal consent obtained.   Assessment: Review of patient past medical history, allergies, medications, health status, including review of consultants reports, laboratory and other test data, was performed as part of comprehensive evaluation and provision of chronic care management services.   SDOH (Social Determinants of Health) assessments and interventions performed:  SDOH Interventions    Flowsheet Row Most Recent Value  SDOH Interventions   Food Insecurity Interventions Intervention Not Indicated  [patient confirms she was provided resources for food insecurity and has been using provided resources- she denies true food insecurity today 08/31/21,  declines offer for additional resources today]  Transportation Interventions Intervention Not Indicated  [Patient reports she has now obtained her drivers liscence and has insurance and is now currently driving self- denies ongoing transportation needs]     CCM Care Plan  Allergies  Allergen Reactions   Belsomra [Suvorexant] Other (See Comments)    Vivid dreams   Wellbutrin [Bupropion] Other (See Comments)    Irritability and Agitation   Geodon [Ziprasidone Hcl] Other (See Comments)     Worsened Restless Leg Syndrome   Lisinopril Cough   Neupro [Rotigotine] Rash   Outpatient Encounter Medications as of 08/31/2021  Medication Sig   ACCU-CHEK AVIVA PLUS test strip Use in the morning and after meals as needed.   baclofen (LIORESAL) 10 MG tablet One tablet twice during the day and 2 at night   carbamazepine (TEGRETOL) 200 MG tablet Take 0.5 tablets (100 mg total) by mouth daily.   cetirizine (ZYRTEC) 10 MG tablet Take 10 mg by mouth daily.   clonazePAM (KLONOPIN) 0.5 MG tablet Take 1 tablet (0.5 mg total) by mouth at bedtime.   DULoxetine (CYMBALTA) 30 MG capsule TAKE 1 CAPSULE DAILY TO BE COMBINED WITH 60MG CAPSULE   DULoxetine (CYMBALTA) 60 MG capsule TAKE 1 CAPSULE BY MOUTH ONCE DAILY - TAKE WITH 30MG CAPSULE   estradiol (ESTRACE) 0.5 MG tablet Take 1 tablet (0.5 mg total) by mouth daily.   gabapentin (NEURONTIN) 800 MG tablet TAKE 1 TAKE THREE TIMES A DAY   JANUVIA 100 MG tablet TAKE 1 TABLET BY MOUTH ONCE DAILY   losartan-hydrochlorothiazide (HYZAAR) 100-12.5 MG tablet Take 1 tablet by mouth daily.   meloxicam (MOBIC) 15 MG tablet Take 15 mg by mouth daily.   omeprazole (PRILOSEC) 20 MG capsule Take 20 mg by mouth as needed (heartburn).   ondansetron (ZOFRAN-ODT) 4 MG disintegrating tablet Take 1 tablet (4 mg total) by mouth every 8 (eight) hours as needed for nausea or vomiting.   pramipexole (MIRAPEX) 0.75 MG tablet One tablet in the morning, 3 at night   propranolol (INDERAL) 20 MG tablet Take 1 tablet (20 mg total) by mouth 2 (two) times daily.   rosuvastatin (CRESTOR) 40 MG tablet Take 1 tablet (  40 mg total) by mouth daily.   No facility-administered encounter medications on file as of 08/31/2021.   Patient Active Problem List   Diagnosis Date Noted   Palpitations 10/30/2020   Panic disorder 10/30/2020   Syncope and collapse 10/27/2020   Benzodiazepine dependence in remission ([place]) 03/17/2020   Morbid obesity ([place]) 03/17/2020   Adrenal mass ([place]) 03/17/2020    PAD (peripheral artery disease) ([place]) 02/18/2020   Chronic venous insufficiency 02/18/2020   Hyperlipidemia 02/18/2020   Bipolar disorder, in full remission, most recent episode mixed ([place]) 01/03/2020   Attention deficit 01/03/2020   Aortic atherosclerosis ([place]) 11/20/2019   Restless leg syndrome 02/15/2019   Bipolar I disorder, most recent episode mixed ([place]) 09/14/2018   GAD (generalized anxiety disorder) 09/14/2018   Insomnia due to mental disorder 09/14/2018   Diabetes ([place] ) 07/13/2018   Seizure disorder ([place]) 10/19/2017   Osteoarthritis of ankle and foot 03/27/2015   ANA positive 03/05/2015   Elevated rheumatoid factor 03/05/2015   Hypertension associated with diabetes ([place]) 09/04/2014   Avitaminosis D 09/04/2014   Chronic low back pain 02/18/2014   Menopausal hot flushes 08/14/2012   Arthritis of knee, degenerative 04/06/2011   Benign essential HTN 07/13/2010   Essential (primary) hypertension 07/13/2010   Insomnia, unspecified 07/13/2010   Impaired fasting glucose 07/13/2010   Chronic pain 01/08/2010   Affective disorder, major 11/05/2009   Major depressive disorder 11/05/2009   Major depressive disorder, single episode, unspecified 11/05/2009   Conditions to be addressed/monitored:  HTN and DMII  Care Plan : RN Care Manager Plan of Care  Updates made by Knox Royalty, RN since 08/31/2021 12:00 AM     Problem: Chronic Disease Management Needs   Priority: High     Long-Range Goal: Ongoing progression of plan of care for long term chronic disease management   Start Date: 01/20/2021  Expected End Date: 01/20/2022  Priority: High  Note:   Current Barriers:  Chronic Disease Management support and education needs related to HTN and DMII in setting of chronic depression/ anxiety 02/03/21- patient reported 01/20/21 loss of her roommate/ friend of 40 years; reports remains active with psychiatric provider she is well established with: today, she reports she believes she  is progressing through her grief well- depression screen updated 05/28/21: continues to report regularly followed by her counselor/ psychiatric provider; reports she believes she "is doing much better;" and is "stable" in recovering from her grief around the recent death of her roommate 08-23-21: reports continues established with psychiatric provider/ counselor; reports last virtual visit as "yesterday" with next scheduled visit 08/07/21 08/31/21: confirms she continues actively followed by current psychiatric provider; tells me her depression is "much better;" denies ongoing mental health needs Patient consistently non-adherent to monitoring blood sugars/ blood pressures at home; lack of motivation in self-care 08/31/21: continues to report not monitoring blood sugars/ blood pressures at home: she does not wish to start home monitoring of either SDOH needs- transportation and food acquisition; community resource care guide referral placed 05/28/21 August 23, 2021: confirmed [employer] patient and provided resources- she has not yet utilized resources that were provided; she continues to tell me that she has an insured vehicle but no drivers license: she has been occasionally driving despite not having a drivers license- I advised today to please not drive without a drivers license 1/57/26: patient tells me today that she is now driving- obtained her valid [place] driver's license and reports being fully insured; denies ongoing transportation needs 08/31/21: confirms  she obtained and has used several resources that were provided for food acquisition: states she has no ongoing needs around food insecurity  Increase in A1-C from 6.9 (10/30/21) to 8.6 (06/17/21): patient confirms she is still not monitoring blood sugars at home; confirmed by review of EHR- no changes to current medications as a result of increased  A1-C 08/31/21: she again confirms that she is not interested in home monitoring of  blood sugars 2 recent ED visits 06/24/21 and 07/22/21- pyelonephritis and then cystitis- discharged home after MSE with antibiotics and pain medication 08/31/21: she tells me she is much better and "fully recuperated" after recent ED visits: denies clinical concerns today  RNCM Clinical Goal(s):  Patient will demonstrate Improved health management independence HTN; DMII; anxiety/ depression  through collaboration with RN Care manager, provider, and care team.   Interventions: 1:1 collaboration with primary care provider regarding development and update of comprehensive plan of care as evidenced by provider attestation and co-signature Inter-disciplinary care team collaboration (see longitudinal plan of care) Evaluation of current treatment plan related to  self management and patient's adherence to plan as established by provider Review of patient status, including review of consultants reports, relevant laboratory and other test results, and medications completed SDOH updated: no new/ unmet concerns identified Pain assessment updated: denies pain today Falls assessment updated: continues to deny new/ recent falls x 12 months- currently not using assistive devices;  positive reinforcement provided with encouragement to continue efforts at fall prevention; previously provided education around fall risks/ prevention reinforced Medications discussed: reports continues to independently self-manage and denies current concerns/ issues/ questions around medications; endorses adherence to taking all medications as prescribed Reviewed upcoming scheduled provider appointments: 10/13/21- cardiology; 10/28/21- neurology; 12/16/21- PCP; patient confirms is aware of all and has plans to attend as scheduled Discussed plans with patient for ongoing care management follow up: she denies ongoing care coordination/ care management needs  and confirms she remains uninterested in home monitoring of blood sugars/ blood  pressures; patient has essentially met her previously established CCM RN CM goals; case closure today accordingly  Hypertension Interventions: Goal status: 08/31/21: Goal met; Long Term goal Last practice recorded BP readings:  BP Readings from Last 3 Encounters:  07/22/21 138/82  06/24/21 (!) 141/74  06/17/21 130/72  Most recent eGFR/CrCl: No results found for: EGFR  No components found for: CRCL  Evaluation of current treatment plan related to hypertension self management and patient's adherence to plan as established by provider Discussed complications of poorly controlled blood pressure such as heart disease, stroke, circulatory complications, vision complications, kidney impairment, sexual dysfunction Confirmed has still not been monitoring/ recording blood pressures at home: she remains lacking in motivation and states she does not plan to start BP monitoring at home, however, I again discussed benefit of monitoring at home, for her own knowledge/ empowerment and encouraged her to start monitoring blood pressures in the future Reinforced previously provided education around importance of following prescribed diet: heart healthy, low salt, carbohydrate modified; low sugar Tells me she has been very active lately working in her yard and cleaning her mobile home: benefits of activity for BP/ blood sugar control discussed and encouraged  Diabetes Interventions: Goal Status: 5626/23: Goal met; Long Term goal  Provided education to patient about basic DM disease process Counseled on importance of regular laboratory monitoring as prescribed Re- confirmed patient has still not been consistently monitoring/ recording blood sugars at home- remains not motivated; again encouraged patient to consider monitoring/ recording at  home in the future  Assessed patient's understanding of meaning/ significance of A1-C values: ongoing fair baseline understanding of same- Reviewed individual historical A1-C  trends and provided education around correlation of A1-C value to blood sugar levels at home over 3 months Again discussed/ provided education around value of monitoring blood sugars at home to stay on track and assist her care team providers in making sure her medications are optimized; she again tells me that she will "consider and try to start" monitoring  Reinforced previously provided education around long term complications/ risks of ongoing high blood sugars  Lab Results  Component Value Date   HGBA1C 8.6 (H) 06/17/2021  Patient Goals/Self-Care Activities: As evidenced by review of EHR, collaboration with care team, and patient reporting during CCM RN CM outreach,  [name]  will: Take medications as prescribed Attend all scheduled provider appointments Call pharmacy for medication refills Call provider office for new concerns or questions Continue actively engaging with your psychiatric counselor as you have been Please consider checking and writing down on paper fasting (first thing in the morning, before eating) blood sugars at home Your A1-C result has increased significantly from your last value, which was 6.9 (10/30/20)- 6.9 is roughly an average blood sugar over 3 months of 145 Your most recent A1-C on 06/17/21 was 8.6, which is roughly an average blood sugar over 3 months of 200 Monitoring your blood sugar at home is the best way to know where your blood sugar is running and how well your medications are working Please consider occasionally checking your blood pressures at home- please write down your blood pressures when you check them Continue to follow heart healthy, low salt, low cholesterol, carbohydrate-modified, low sugar diet Keep up the great work preventing falls    Plan: No further follow up required: patient has essentially met her previously established CCM RN CM goals and verbalizes ongoing lack of motivation/ desire to begin home monitoring of blood sugars  and blood pressures; she denies ongoing care coordination/ care management needs today; CCM RN CM case closure accordingly   [name], RN, BSN, [place] [phone]: direct office

## 2021-09-02 ENCOUNTER — Other Ambulatory Visit: Payer: Self-pay | Admitting: Emergency Medicine

## 2021-09-04 DIAGNOSIS — I10 Essential (primary) hypertension: Secondary | ICD-10-CM

## 2021-09-04 DIAGNOSIS — E1159 Type 2 diabetes mellitus with other circulatory complications: Secondary | ICD-10-CM | POA: Diagnosis not present

## 2021-09-04 DIAGNOSIS — Z7984 Long term (current) use of oral hypoglycemic drugs: Secondary | ICD-10-CM

## 2021-09-07 ENCOUNTER — Other Ambulatory Visit: Payer: Self-pay | Admitting: Emergency Medicine

## 2021-09-18 ENCOUNTER — Other Ambulatory Visit: Payer: Self-pay | Admitting: Emergency Medicine

## 2021-09-18 DIAGNOSIS — F410 Panic disorder [episodic paroxysmal anxiety] without agoraphobia: Secondary | ICD-10-CM

## 2021-09-22 ENCOUNTER — Telehealth: Payer: Self-pay | Admitting: Neurology

## 2021-09-22 ENCOUNTER — Other Ambulatory Visit: Payer: Self-pay | Admitting: Neurology

## 2021-09-22 NOTE — Telephone Encounter (Signed)
The patient's medication was requested by her pharmacy in the refill queue. The medication has been sent to requesting pharmacy.

## 2021-09-22 NOTE — Telephone Encounter (Signed)
Left message for return call so patient can confirm the pharmacy. Will fill rx once confirmed.

## 2021-09-22 NOTE — Telephone Encounter (Signed)
Pt called because she is needing a refill on her pramipexole (MIRAPEX) 0.75 MG tablet due to only having left enough for today. Pt would like to know if she can get this filled.

## 2021-09-22 NOTE — Telephone Encounter (Signed)
Attempted to contact patient. WCB.

## 2021-09-23 LAB — HM DIABETES EYE EXAM

## 2021-10-05 ENCOUNTER — Other Ambulatory Visit: Payer: Self-pay | Admitting: Emergency Medicine

## 2021-10-05 DIAGNOSIS — E1165 Type 2 diabetes mellitus with hyperglycemia: Secondary | ICD-10-CM

## 2021-10-08 ENCOUNTER — Encounter (HOSPITAL_COMMUNITY): Payer: Self-pay | Admitting: Pharmacy Technician

## 2021-10-08 ENCOUNTER — Emergency Department (HOSPITAL_COMMUNITY)
Admission: EM | Admit: 2021-10-08 | Discharge: 2021-11-06 | Disposition: E | Payer: Medicare Other | Attending: Emergency Medicine | Admitting: Emergency Medicine

## 2021-10-08 DIAGNOSIS — I469 Cardiac arrest, cause unspecified: Secondary | ICD-10-CM | POA: Insufficient documentation

## 2021-10-08 DIAGNOSIS — R4020 Unspecified coma: Secondary | ICD-10-CM | POA: Diagnosis present

## 2021-10-08 LAB — CBG MONITORING, ED: Glucose-Capillary: 499 mg/dL — ABNORMAL HIGH (ref 70–99)

## 2021-10-08 MED ORDER — EPINEPHRINE 1 MG/10ML IJ SOSY
PREFILLED_SYRINGE | INTRAMUSCULAR | Status: DC | PRN
  Administered 2021-10-08 (×2): 1 mg via INTRAVENOUS

## 2021-10-13 ENCOUNTER — Ambulatory Visit: Payer: Medicare Other | Admitting: Cardiology

## 2021-10-15 ENCOUNTER — Other Ambulatory Visit: Payer: Self-pay | Admitting: Emergency Medicine

## 2021-10-15 DIAGNOSIS — G2581 Restless legs syndrome: Secondary | ICD-10-CM

## 2021-10-28 ENCOUNTER — Ambulatory Visit: Payer: Medicare Other | Admitting: Neurology

## 2021-11-06 NOTE — ED Notes (Signed)
Pt to morgue at this time.

## 2021-11-06 NOTE — Code Documentation (Signed)
Pulse check, asystole. CPR resumed.

## 2021-11-06 NOTE — ED Provider Notes (Signed)
I discussed with medical examiner, Erin Fulling la [place] Gay.  Ultimately this will be an ME case after she has discussed with law enforcement.   Sherwood Gambler, MD 11/03/2021 [phone]

## 2021-11-06 NOTE — ED Provider Notes (Signed)
Rachel Luna Provider Note   CSN: [id number] Arrival date & time: 10/27/2021  1706     History  Chief Complaint  Rachel Luna presents with   Rachel Luna is a 66 y.o. female BIBEMS with cardiac arrest.  History contributed by EMS.  They report that they were called to the scene for unresponsive female found in car, concern for suspected overdose.  Rachel Luna without pulse or respirations.  [place] airway placed, CPR started.  Rachel Luna with CPR for nearly an hour with intermittent asystole on the monitor during pulse check before achieving ROSC.  EMS reports Rachel Luna had pulses for approximately 2 minutes, and then arrested again.  Upon arrival to the emergency Luna, CPR was in progress and [facility] airway in place, with difficult ventilation.     Home Medications Prior to Admission medications   Medication Sig Start Date End Date Taking? Authorizing Provider  ACCU-CHEK AVIVA PLUS test strip Use in the morning and after meals as needed. 07/13/18   Horald Pollen, MD  baclofen (LIORESAL) 10 MG tablet One tablet twice during the day and 2 at night 04/29/21   Suzzanne Cloud, NP  carbamazepine (TEGRETOL) 200 MG tablet Take 0.5 tablets (100 mg total) by mouth daily. 04/29/21   Suzzanne Cloud, NP  cetirizine (ZYRTEC) 10 MG tablet Take 10 mg by mouth daily.    [provider]  clonazePAM (KLONOPIN) 0.5 MG tablet Take 1 tablet (0.5 mg total) by mouth at bedtime. 04/29/21   Suzzanne Cloud, NP  DULoxetine (CYMBALTA) 30 MG capsule TAKE 1 CAPSULE DAILY TO BE COMBINED WITH '60MG'$  CAPSULE 09/02/21   Horald Pollen, MD  DULoxetine (CYMBALTA) 60 MG capsule TAKE 1 CAPSULE BY MOUTH ONCE DAILY - TAKE WITH '30MG'$  CAPSULE 07/17/21   Horald Pollen, MD  estradiol (ESTRACE) 0.5 MG tablet Take 1 tablet (0.5 mg total) by mouth daily. 12/26/18   Shelly Bombard, MD  gabapentin (NEURONTIN) 800 MG tablet TAKE 1 TAKE THREE TIMES A DAY 04/23/21    Horald Pollen, MD  glimepiride (AMARYL) 4 MG tablet TAKE 1 TABLET EVERY MORNING WITH BREAKFAST 09/07/21   Horald Pollen, MD  JANUVIA 100 MG tablet TAKE 1 TABLET BY MOUTH ONCE DAILY 08/26/21   Horald Pollen, MD  losartan-hydrochlorothiazide [facility]) 100-12.5 MG tablet Take 1 tablet by mouth daily. 06/17/21   Horald Pollen, MD  meloxicam (MOBIC) 15 MG tablet Take 15 mg by mouth daily. 07/04/15   [provider]  omeprazole (PRILOSEC) 20 MG capsule Take 20 mg by mouth as needed (heartburn).    [provider]  ondansetron (ZOFRAN-ODT) 4 MG disintegrating tablet Take 1 tablet (4 mg total) by mouth every 8 (eight) hours as needed for nausea or vomiting. 06/24/21   Teodoro Spray, PA  pramipexole (MIRAPEX) 0.75 MG tablet One tablet in the morning, 3 at night 09/22/21   Suzzanne Cloud, NP  propranolol (INDERAL) 20 MG tablet Take 1 tablet (20 mg total) by mouth 2 (two) times daily. 08/26/21   Horald Pollen, MD  rosuvastatin (CRESTOR) 40 MG tablet TAKE 1 TABLET BY MOUTH ONCE DAILY 10/05/21   Horald Pollen, MD      Allergies    Belsomra [suvorexant], Wellbutrin [bupropion], Geodon [ziprasidone hcl], Lisinopril, and Neupro [rotigotine]    Review of Systems   Review of Systems Unable to obtain given critical illness.  Physical Exam Updated Vital Signs Ht '5\' 4"'$  (  1.626 m)   Wt 126.6 kg   BMI 47.89 kg/m  Physical Exam Constitutional:      General: Rachel Luna is in acute distress.     Appearance: Rachel Luna is toxic-appearing.  HENT:     Head: Normocephalic and atraumatic.  Cardiovascular:     Comments: Asystole on monitor.  No pulses present.  CPR in progress. Pulmonary:     Comments: No respiratory effort.    ED Results / Procedures / Treatments   Labs (all labs ordered are listed, but only abnormal results are displayed) Labs Reviewed - No data to display  EKG None  Radiology No results found.  Procedures Procedures   Medications  Ordered in ED Medications  EPINEPHrine (ADRENALIN) 1 MG/10ML injection (1 mg Intravenous Given 2021/10/14 1710)    ED Course/ Medical Decision Making/ A&P                           Medical Decision Making Risk Prescription drug management.   66 year old female arrives critically ill with CPR in progress after prolonged cardiac arrest with CPR for over an hour. EMS obtained ROSC once prior to arrival, but almost immediately lost pulses again.  Rachel Luna presents with CPR in progress, with questionable airway with difficult ventilation.  EMS reports giving 10 doses of epinephrine prior to arrival.  At 1 additional dose was provided here.  At initial pulse check, Rachel Luna with asystole.  CPR resumed.  We attempted endotracheal intubation without success and returned to bag mask ventilation.  After an additional 2 rounds of CPR, Rachel Luna still did not have pulses.  Asystole on the cardiac monitor.  Given prolonged arrest with greater than 1 hour of CPR, decision was made to discontinue CPR.  Time of death called by my attending.   Final Clinical Impression(s) / ED Diagnoses Final diagnoses:  Cardiac arrest [facility])    Rx / DC Orders ED Discharge Orders     None         Faylene Million, MD 10/09/21 [id number]    Sherwood Gambler, MD 10/12/21 [phone]

## 2021-11-06 NOTE — ED Provider Notes (Incomplete)
[facility] EMERGENCY DEPARTMENT Provider Note   CSN: [id number] Arrival date & time: 11-01-21  1706     History  Chief Complaint  Patient presents with  . Cardiac Arrest    Rachel Luna is a 66 y.o. female.   Cardiac Arrest 66 year old female past medical history hypertension, diabetes, chronic pain, seizure disorder who is presenting after cardiac arrest.  EMS provides the history.  They report that patient was found unresponsive in a car after suspected overdose.  They placed a King airway, performed compressions for over an hour prior to obtaining ROSC. Patient had pulse for approximately 2 minutes, and then suffered another cardiac arrest.  On arrival, [facility] airway in place, CPR in progress.     Home Medications Prior to Admission medications   Medication Sig Start Date End Date Taking? Authorizing Provider  ACCU-CHEK AVIVA PLUS test strip Use in the morning and after meals as needed. 07/13/18   Horald Pollen, MD  baclofen (LIORESAL) 10 MG tablet One tablet twice during the day and 2 at night 04/29/21   Suzzanne Cloud, NP  carbamazepine (TEGRETOL) 200 MG tablet Take 0.5 tablets (100 mg total) by mouth daily. 04/29/21   Suzzanne Cloud, NP  cetirizine (ZYRTEC) 10 MG tablet Take 10 mg by mouth daily.    [provider]  clonazePAM (KLONOPIN) 0.5 MG tablet Take 1 tablet (0.5 mg total) by mouth at bedtime. 04/29/21   Suzzanne Cloud, NP  DULoxetine (CYMBALTA) 30 MG capsule TAKE 1 CAPSULE DAILY TO BE COMBINED WITH '60MG'$  CAPSULE 09/02/21   Horald Pollen, MD  DULoxetine (CYMBALTA) 60 MG capsule TAKE 1 CAPSULE BY MOUTH ONCE DAILY - TAKE WITH '30MG'$  CAPSULE 07/17/21   Horald Pollen, MD  estradiol (ESTRACE) 0.5 MG tablet Take 1 tablet (0.5 mg total) by mouth daily. 12/26/18   Shelly Bombard, MD  gabapentin (NEURONTIN) 800 MG tablet TAKE 1 TAKE THREE TIMES A DAY 04/23/21   Horald Pollen, MD  glimepiride (AMARYL) 4 MG tablet TAKE 1 TABLET  EVERY MORNING WITH BREAKFAST 09/07/21   Horald Pollen, MD  JANUVIA 100 MG tablet TAKE 1 TABLET BY MOUTH ONCE DAILY 08/26/21   Horald Pollen, MD  losartan-hydrochlorothiazide [facility]) 100-12.5 MG tablet Take 1 tablet by mouth daily. 06/17/21   Horald Pollen, MD  meloxicam (MOBIC) 15 MG tablet Take 15 mg by mouth daily. 07/04/15   [provider]  omeprazole (PRILOSEC) 20 MG capsule Take 20 mg by mouth as needed (heartburn).    [provider]  ondansetron (ZOFRAN-ODT) 4 MG disintegrating tablet Take 1 tablet (4 mg total) by mouth every 8 (eight) hours as needed for nausea or vomiting. 06/24/21   Teodoro Spray, PA  pramipexole (MIRAPEX) 0.75 MG tablet One tablet in the morning, 3 at night 09/22/21   Suzzanne Cloud, NP  propranolol (INDERAL) 20 MG tablet Take 1 tablet (20 mg total) by mouth 2 (two) times daily. 08/26/21   Horald Pollen, MD  rosuvastatin (CRESTOR) 40 MG tablet TAKE 1 TABLET BY MOUTH ONCE DAILY 10/05/21   Horald Pollen, MD      Allergies    Belsomra [suvorexant], Wellbutrin [bupropion], Geodon [ziprasidone hcl], Lisinopril, and Neupro [rotigotine]    Review of Systems   Review of Systems As in HPI.  Physical Exam Updated Vital Signs Ht '5\' 4"'$  (1.626 m)   Wt 126.6 kg   BMI 47.89 kg/m   Physical Exam  ED  Results / Procedures / Treatments   Labs (all labs ordered are listed, but only abnormal results are displayed) Labs Reviewed - No data to display  EKG None  Radiology No results found.  Procedures Procedures  {Document cardiac monitor, telemetry assessment procedure when appropriate:1}  Medications Ordered in ED Medications  EPINEPHrine (ADRENALIN) 1 MG/10ML injection (1 mg Intravenous Given 10-28-21 1710)    ED Course/ Medical Decision Making/ A&P                           Medical Decision Making Risk Prescription drug management.   ***  {Document critical care time when appropriate:1} {Document  review of labs and clinical decision tools ie heart score, Chads2Vasc2 etc:1}  {Document your independent review of radiology images, and any outside records:1} {Document your discussion with family members, caretakers, and with consultants:1} {Document social determinants of health affecting pt's care:1} {Document your decision making why or why not admission, treatments were needed:1} Final Clinical Impression(s) / ED Diagnoses Final diagnoses:  None    Rx / DC Orders ED Discharge Orders     None

## 2021-11-06 NOTE — Code Documentation (Signed)
Pulse Check, asystole. TOD called by Dr. Regenia Skeeter.

## 2021-11-06 NOTE — ED Triage Notes (Signed)
Pt bib ems after found in her car. Pt without a pulse. CPR initiated at 1557. Pt found to be in vfib, shocked X2. Pt given 10 doses of epi en route. Remained in asystole. Rosc X2 minutes with a rate of 40 before losing pulses again. Pt arrives CPR in progress with a king airway in place. Pt with IO to L tib. CBG 484. Suctioned approx 800cc blood from king, nose and mouth.

## 2021-11-06 DEATH — deceased

## 2021-12-16 ENCOUNTER — Ambulatory Visit: Payer: Medicare Other | Admitting: Emergency Medicine
